# Patient Record
Sex: Female | Born: 1935 | Race: White | Hispanic: No | Smoking: Never smoker
Health system: Southern US, Community
[De-identification: ages and names within clinical notes are randomized; demographics above are authoritative.]

## PROBLEM LIST (undated history)

## (undated) DIAGNOSIS — K279 Peptic ulcer, site unspecified, unspecified as acute or chronic, without hemorrhage or perforation: Secondary | ICD-10-CM

## (undated) DIAGNOSIS — I00 Rheumatic fever without heart involvement: Secondary | ICD-10-CM

## (undated) DIAGNOSIS — I471 Supraventricular tachycardia: Secondary | ICD-10-CM

## (undated) DIAGNOSIS — I5189 Other ill-defined heart diseases: Secondary | ICD-10-CM

## (undated) DIAGNOSIS — M8580 Other specified disorders of bone density and structure, unspecified site: Secondary | ICD-10-CM

## (undated) DIAGNOSIS — N183 Chronic kidney disease, stage 3 unspecified: Secondary | ICD-10-CM

## (undated) DIAGNOSIS — N950 Postmenopausal bleeding: Secondary | ICD-10-CM

## (undated) DIAGNOSIS — E785 Hyperlipidemia, unspecified: Secondary | ICD-10-CM

## (undated) DIAGNOSIS — E7800 Pure hypercholesterolemia, unspecified: Secondary | ICD-10-CM

## (undated) DIAGNOSIS — I2510 Atherosclerotic heart disease of native coronary artery without angina pectoris: Secondary | ICD-10-CM

## (undated) DIAGNOSIS — I350 Nonrheumatic aortic (valve) stenosis: Secondary | ICD-10-CM

## (undated) DIAGNOSIS — Z8711 Personal history of peptic ulcer disease: Secondary | ICD-10-CM

## (undated) DIAGNOSIS — R0600 Dyspnea, unspecified: Secondary | ICD-10-CM

## (undated) DIAGNOSIS — M1990 Unspecified osteoarthritis, unspecified site: Secondary | ICD-10-CM

## (undated) DIAGNOSIS — J302 Other seasonal allergic rhinitis: Secondary | ICD-10-CM

## (undated) DIAGNOSIS — I5032 Chronic diastolic (congestive) heart failure: Secondary | ICD-10-CM

## (undated) DIAGNOSIS — C50911 Malignant neoplasm of unspecified site of right female breast: Secondary | ICD-10-CM

## (undated) DIAGNOSIS — E039 Hypothyroidism, unspecified: Secondary | ICD-10-CM

## (undated) DIAGNOSIS — Z8679 Personal history of other diseases of the circulatory system: Secondary | ICD-10-CM

## (undated) DIAGNOSIS — C50919 Malignant neoplasm of unspecified site of unspecified female breast: Secondary | ICD-10-CM

## (undated) DIAGNOSIS — E559 Vitamin D deficiency, unspecified: Secondary | ICD-10-CM

## (undated) DIAGNOSIS — M810 Age-related osteoporosis without current pathological fracture: Secondary | ICD-10-CM

## (undated) DIAGNOSIS — I779 Disorder of arteries and arterioles, unspecified: Secondary | ICD-10-CM

## (undated) HISTORY — DX: Malignant neoplasm of unspecified site of right female breast: C50.911

## (undated) HISTORY — DX: Chronic kidney disease, stage 3 (moderate): N18.3

## (undated) HISTORY — DX: Other specified disorders of bone density and structure, unspecified site: M85.80

## (undated) HISTORY — PX: BREAST SURGERY: SHX581

## (undated) HISTORY — DX: Pure hypercholesterolemia, unspecified: E78.00

## (undated) HISTORY — DX: Vitamin D deficiency, unspecified: E55.9

## (undated) HISTORY — PX: THYROID SURGERY: SHX805

## (undated) HISTORY — DX: Atherosclerotic heart disease of native coronary artery without angina pectoris: I25.10

## (undated) HISTORY — DX: Hypothyroidism, unspecified: E03.9

## (undated) HISTORY — PX: TONSILLECTOMY: SUR1361

## (undated) HISTORY — DX: Chronic kidney disease, stage 3 unspecified: N18.30

## (undated) HISTORY — PX: TOTAL KNEE ARTHROPLASTY: SHX125

## (undated) HISTORY — DX: Rheumatic fever without heart involvement: I00

## (undated) HISTORY — DX: Supraventricular tachycardia: I47.1

## (undated) HISTORY — DX: Peptic ulcer, site unspecified, unspecified as acute or chronic, without hemorrhage or perforation: K27.9

## (undated) HISTORY — DX: Disorder of arteries and arterioles, unspecified: I77.9

## (undated) HISTORY — DX: Other ill-defined heart diseases: I51.89

## (undated) HISTORY — PX: CATARACT EXTRACTION W/ INTRAOCULAR LENS  IMPLANT, BILATERAL: SHX1307

---

## 1967-01-05 HISTORY — PX: THYROID SURGERY: SHX805

## 2001-01-04 HISTORY — PX: TOTAL HIP ARTHROPLASTY: SHX124

## 2003-11-11 ENCOUNTER — Encounter: Admission: RE | Admit: 2003-11-11 | Discharge: 2003-11-11 | Payer: Self-pay | Admitting: Family Medicine

## 2004-01-05 DIAGNOSIS — Z853 Personal history of malignant neoplasm of breast: Secondary | ICD-10-CM

## 2004-01-05 HISTORY — DX: Personal history of malignant neoplasm of breast: Z85.3

## 2004-01-05 HISTORY — PX: PARTIAL MASTECTOMY WITH AXILLARY SENTINEL LYMPH NODE BIOPSY: SHX6004

## 2004-03-26 ENCOUNTER — Encounter: Admission: RE | Admit: 2004-03-26 | Discharge: 2004-03-26 | Payer: Self-pay | Admitting: Family Medicine

## 2004-04-22 ENCOUNTER — Encounter (INDEPENDENT_AMBULATORY_CARE_PROVIDER_SITE_OTHER): Payer: Self-pay | Admitting: Diagnostic Radiology

## 2004-04-22 ENCOUNTER — Encounter: Admission: RE | Admit: 2004-04-22 | Discharge: 2004-04-22 | Payer: Self-pay | Admitting: Family Medicine

## 2004-04-22 ENCOUNTER — Encounter (INDEPENDENT_AMBULATORY_CARE_PROVIDER_SITE_OTHER): Payer: Self-pay | Admitting: *Deleted

## 2004-04-26 ENCOUNTER — Encounter: Admission: RE | Admit: 2004-04-26 | Discharge: 2004-04-26 | Payer: Self-pay | Admitting: General Surgery

## 2004-05-13 ENCOUNTER — Encounter: Admission: RE | Admit: 2004-05-13 | Discharge: 2004-05-13 | Payer: Self-pay | Admitting: Surgery

## 2004-05-14 ENCOUNTER — Ambulatory Visit (HOSPITAL_COMMUNITY): Admission: RE | Admit: 2004-05-14 | Discharge: 2004-05-14 | Payer: Self-pay | Admitting: Surgery

## 2004-05-14 ENCOUNTER — Ambulatory Visit (HOSPITAL_BASED_OUTPATIENT_CLINIC_OR_DEPARTMENT_OTHER): Admission: RE | Admit: 2004-05-14 | Discharge: 2004-05-14 | Payer: Self-pay | Admitting: Surgery

## 2004-05-14 ENCOUNTER — Encounter (INDEPENDENT_AMBULATORY_CARE_PROVIDER_SITE_OTHER): Payer: Self-pay | Admitting: *Deleted

## 2004-05-15 ENCOUNTER — Ambulatory Visit: Payer: Self-pay | Admitting: Oncology

## 2004-05-22 ENCOUNTER — Ambulatory Visit: Admission: RE | Admit: 2004-05-22 | Discharge: 2004-07-10 | Payer: Self-pay | Admitting: Radiation Oncology

## 2004-06-09 ENCOUNTER — Ambulatory Visit (HOSPITAL_COMMUNITY): Admission: RE | Admit: 2004-06-09 | Discharge: 2004-06-09 | Payer: Self-pay | Admitting: Oncology

## 2004-06-30 ENCOUNTER — Ambulatory Visit: Payer: Self-pay | Admitting: Oncology

## 2004-08-17 ENCOUNTER — Ambulatory Visit: Payer: Self-pay | Admitting: Oncology

## 2004-08-27 ENCOUNTER — Other Ambulatory Visit: Admission: RE | Admit: 2004-08-27 | Discharge: 2004-08-27 | Payer: Self-pay | Admitting: Obstetrics and Gynecology

## 2004-10-05 ENCOUNTER — Ambulatory Visit: Payer: Self-pay | Admitting: Oncology

## 2004-11-25 ENCOUNTER — Ambulatory Visit: Admission: RE | Admit: 2004-11-25 | Discharge: 2005-02-18 | Payer: Self-pay | Admitting: Radiation Oncology

## 2004-12-15 ENCOUNTER — Encounter: Admission: RE | Admit: 2004-12-15 | Discharge: 2004-12-15 | Payer: Self-pay | Admitting: Radiation Oncology

## 2005-01-01 ENCOUNTER — Ambulatory Visit: Payer: Self-pay | Admitting: Oncology

## 2005-03-15 ENCOUNTER — Ambulatory Visit: Payer: Self-pay | Admitting: Oncology

## 2005-03-17 ENCOUNTER — Ambulatory Visit (HOSPITAL_COMMUNITY): Admission: RE | Admit: 2005-03-17 | Discharge: 2005-03-17 | Payer: Self-pay | Admitting: Oncology

## 2005-03-23 ENCOUNTER — Encounter: Admission: RE | Admit: 2005-03-23 | Discharge: 2005-03-23 | Payer: Self-pay | Admitting: Oncology

## 2005-04-23 ENCOUNTER — Encounter: Admission: RE | Admit: 2005-04-23 | Discharge: 2005-04-23 | Payer: Self-pay | Admitting: Family Medicine

## 2005-06-23 ENCOUNTER — Ambulatory Visit: Payer: Self-pay | Admitting: Oncology

## 2005-09-03 ENCOUNTER — Ambulatory Visit: Payer: Self-pay | Admitting: Oncology

## 2005-09-08 LAB — COMPREHENSIVE METABOLIC PANEL
ALT: 16 U/L (ref 0–40)
BUN: 17 mg/dL (ref 6–23)
CO2: 28 mEq/L (ref 19–32)
Creatinine, Ser: 0.97 mg/dL (ref 0.40–1.20)
Total Bilirubin: 0.5 mg/dL (ref 0.3–1.2)

## 2005-09-08 LAB — CBC WITH DIFFERENTIAL/PLATELET
BASO%: 0.5 % (ref 0.0–2.0)
Basophils Absolute: 0 10*3/uL (ref 0.0–0.1)
HCT: 38.9 % (ref 34.8–46.6)
LYMPH%: 25 % (ref 14.0–48.0)
MCH: 32.4 pg (ref 26.0–34.0)
MCHC: 34.5 g/dL (ref 32.0–36.0)
MONO#: 0.4 10*3/uL (ref 0.1–0.9)
NEUT%: 59.1 % (ref 39.6–76.8)
Platelets: 256 10*3/uL (ref 145–400)

## 2005-09-08 LAB — CANCER ANTIGEN 27.29: CA 27.29: 15 U/mL (ref 0–39)

## 2005-09-14 ENCOUNTER — Ambulatory Visit: Admission: RE | Admit: 2005-09-14 | Discharge: 2005-09-26 | Payer: Self-pay | Admitting: Radiation Oncology

## 2005-11-02 ENCOUNTER — Encounter: Admission: RE | Admit: 2005-11-02 | Discharge: 2005-11-02 | Payer: Self-pay | Admitting: Family Medicine

## 2006-03-03 ENCOUNTER — Ambulatory Visit: Payer: Self-pay | Admitting: Oncology

## 2006-03-08 LAB — CBC WITH DIFFERENTIAL/PLATELET
Basophils Absolute: 0 10*3/uL (ref 0.0–0.1)
Eosinophils Absolute: 0.2 10*3/uL (ref 0.0–0.5)
HCT: 40.6 % (ref 34.8–46.6)
HGB: 14.1 g/dL (ref 11.6–15.9)
MCH: 31.2 pg (ref 26.0–34.0)
MONO#: 0.4 10*3/uL (ref 0.1–0.9)
NEUT#: 2.6 10*3/uL (ref 1.5–6.5)
NEUT%: 60.8 % (ref 39.6–76.8)
RDW: 13.2 % (ref 11.3–14.5)
WBC: 4.3 10*3/uL (ref 3.9–10.0)
lymph#: 1.1 10*3/uL (ref 0.9–3.3)

## 2006-03-08 LAB — COMPREHENSIVE METABOLIC PANEL
ALT: 16 U/L (ref 0–35)
Alkaline Phosphatase: 73 U/L (ref 39–117)
Creatinine, Ser: 1.07 mg/dL (ref 0.40–1.20)
Glucose, Bld: 124 mg/dL — ABNORMAL HIGH (ref 70–99)
Sodium: 140 mEq/L (ref 135–145)
Total Bilirubin: 0.5 mg/dL (ref 0.3–1.2)
Total Protein: 7.3 g/dL (ref 6.0–8.3)

## 2006-04-25 ENCOUNTER — Encounter: Admission: RE | Admit: 2006-04-25 | Discharge: 2006-04-25 | Payer: Self-pay | Admitting: Oncology

## 2006-09-01 ENCOUNTER — Ambulatory Visit: Payer: Self-pay | Admitting: Oncology

## 2006-09-06 LAB — COMPREHENSIVE METABOLIC PANEL
BUN: 24 mg/dL — ABNORMAL HIGH (ref 6–23)
CO2: 26 mEq/L (ref 19–32)
Calcium: 9.7 mg/dL (ref 8.4–10.5)
Chloride: 103 mEq/L (ref 96–112)
Creatinine, Ser: 0.94 mg/dL (ref 0.40–1.20)
Glucose, Bld: 93 mg/dL (ref 70–99)
Total Bilirubin: 0.6 mg/dL (ref 0.3–1.2)

## 2006-09-06 LAB — CANCER ANTIGEN 27.29: CA 27.29: 10 U/mL (ref 0–39)

## 2006-09-06 LAB — CBC WITH DIFFERENTIAL/PLATELET
BASO%: 0.5 % (ref 0.0–2.0)
Basophils Absolute: 0 10*3/uL (ref 0.0–0.1)
HCT: 38.6 % (ref 34.8–46.6)
HGB: 13.5 g/dL (ref 11.6–15.9)
LYMPH%: 26.2 % (ref 14.0–48.0)
MCHC: 35 g/dL (ref 32.0–36.0)
MONO#: 0.3 10*3/uL (ref 0.1–0.9)
NEUT%: 62.1 % (ref 39.6–76.8)
Platelets: 257 10*3/uL (ref 145–400)
WBC: 3.9 10*3/uL (ref 3.9–10.0)
lymph#: 1 10*3/uL (ref 0.9–3.3)

## 2007-01-05 HISTORY — PX: REPLACEMENT TOTAL KNEE: SUR1224

## 2007-01-16 ENCOUNTER — Inpatient Hospital Stay (HOSPITAL_COMMUNITY): Admission: RE | Admit: 2007-01-16 | Discharge: 2007-01-19 | Payer: Self-pay | Admitting: Orthopedic Surgery

## 2007-03-02 ENCOUNTER — Ambulatory Visit: Payer: Self-pay | Admitting: Oncology

## 2007-03-07 LAB — COMPREHENSIVE METABOLIC PANEL WITH GFR
ALT: 14 U/L (ref 0–35)
AST: 16 U/L (ref 0–37)
Albumin: 4.8 g/dL (ref 3.5–5.2)
Alkaline Phosphatase: 81 U/L (ref 39–117)
BUN: 17 mg/dL (ref 6–23)
CO2: 27 meq/L (ref 19–32)
Calcium: 10.2 mg/dL (ref 8.4–10.5)
Chloride: 97 meq/L (ref 96–112)
Creatinine, Ser: 1.02 mg/dL (ref 0.40–1.20)
Glucose, Bld: 121 mg/dL — ABNORMAL HIGH (ref 70–99)
Potassium: 4.5 meq/L (ref 3.5–5.3)
Sodium: 138 meq/L (ref 135–145)
Total Bilirubin: 0.6 mg/dL (ref 0.3–1.2)
Total Protein: 7.9 g/dL (ref 6.0–8.3)

## 2007-03-07 LAB — CBC WITH DIFFERENTIAL/PLATELET
BASO%: 0.3 % (ref 0.0–2.0)
EOS%: 3.3 % (ref 0.0–7.0)
MCH: 31.7 pg (ref 26.0–34.0)
MCV: 92.9 fL (ref 81.0–101.0)
MONO%: 5.9 % (ref 0.0–13.0)
NEUT#: 3.6 10*3/uL (ref 1.5–6.5)
RBC: 4.48 10*6/uL (ref 3.70–5.32)
RDW: 13.4 % (ref 11.3–14.5)

## 2007-03-07 LAB — CANCER ANTIGEN 27.29: CA 27.29: 17 U/mL (ref 0–39)

## 2007-04-28 ENCOUNTER — Encounter: Admission: RE | Admit: 2007-04-28 | Discharge: 2007-04-28 | Payer: Self-pay | Admitting: Oncology

## 2007-09-04 ENCOUNTER — Ambulatory Visit: Payer: Self-pay | Admitting: Oncology

## 2007-09-07 LAB — CBC WITH DIFFERENTIAL/PLATELET
Basophils Absolute: 0 10*3/uL (ref 0.0–0.1)
EOS%: 3.1 % (ref 0.0–7.0)
HGB: 13.7 g/dL (ref 11.6–15.9)
MCH: 32.8 pg (ref 26.0–34.0)
NEUT#: 1.6 10*3/uL (ref 1.5–6.5)
RDW: 13.4 % (ref 11.3–14.5)
WBC: 3.1 10*3/uL — ABNORMAL LOW (ref 3.9–10.0)
lymph#: 1 10*3/uL (ref 0.9–3.3)

## 2007-09-07 LAB — COMPREHENSIVE METABOLIC PANEL
ALT: 23 U/L (ref 0–35)
AST: 26 U/L (ref 0–37)
Albumin: 4.1 g/dL (ref 3.5–5.2)
BUN: 20 mg/dL (ref 6–23)
Calcium: 9.8 mg/dL (ref 8.4–10.5)
Chloride: 101 mEq/L (ref 96–112)
Potassium: 4.1 mEq/L (ref 3.5–5.3)

## 2008-03-05 ENCOUNTER — Ambulatory Visit: Payer: Self-pay | Admitting: Oncology

## 2008-03-07 LAB — CANCER ANTIGEN 27.29: CA 27.29: 7 U/mL (ref 0–39)

## 2008-03-07 LAB — CBC WITH DIFFERENTIAL/PLATELET
Basophils Absolute: 0 10*3/uL (ref 0.0–0.1)
HCT: 41 % (ref 34.8–46.6)
HGB: 14.1 g/dL (ref 11.6–15.9)
MONO#: 0.3 10*3/uL (ref 0.1–0.9)
NEUT%: 65.8 % (ref 38.4–76.8)
Platelets: 241 10*3/uL (ref 145–400)
WBC: 4 10*3/uL (ref 3.9–10.3)
lymph#: 0.9 10*3/uL (ref 0.9–3.3)

## 2008-03-07 LAB — COMPREHENSIVE METABOLIC PANEL
ALT: 16 U/L (ref 0–35)
BUN: 30 mg/dL — ABNORMAL HIGH (ref 6–23)
CO2: 28 mEq/L (ref 19–32)
Calcium: 10.1 mg/dL (ref 8.4–10.5)
Chloride: 100 mEq/L (ref 96–112)
Creatinine, Ser: 1.01 mg/dL (ref 0.40–1.20)
Glucose, Bld: 96 mg/dL (ref 70–99)

## 2008-04-29 ENCOUNTER — Encounter: Admission: RE | Admit: 2008-04-29 | Discharge: 2008-04-29 | Payer: Self-pay | Admitting: Oncology

## 2008-08-30 ENCOUNTER — Ambulatory Visit: Payer: Self-pay | Admitting: Oncology

## 2008-09-03 LAB — CBC WITH DIFFERENTIAL/PLATELET
EOS%: 2.4 % (ref 0.0–7.0)
Eosinophils Absolute: 0.1 10*3/uL (ref 0.0–0.5)
LYMPH%: 22.7 % (ref 14.0–49.7)
MCH: 32 pg (ref 25.1–34.0)
MCV: 92.5 fL (ref 79.5–101.0)
MONO%: 6.8 % (ref 0.0–14.0)
NEUT#: 3.3 10*3/uL (ref 1.5–6.5)
Platelets: 238 10*3/uL (ref 145–400)
RBC: 4.46 10*6/uL (ref 3.70–5.45)
RDW: 13.9 % (ref 11.2–14.5)

## 2008-09-04 LAB — COMPREHENSIVE METABOLIC PANEL
AST: 21 U/L (ref 0–37)
Alkaline Phosphatase: 69 U/L (ref 39–117)
BUN: 25 mg/dL — ABNORMAL HIGH (ref 6–23)
Glucose, Bld: 107 mg/dL — ABNORMAL HIGH (ref 70–99)
Sodium: 137 mEq/L (ref 135–145)
Total Bilirubin: 0.6 mg/dL (ref 0.3–1.2)
Total Protein: 7.3 g/dL (ref 6.0–8.3)

## 2008-09-04 LAB — VITAMIN D 25 HYDROXY (VIT D DEFICIENCY, FRACTURES): Vit D, 25-Hydroxy: 40 ng/mL (ref 30–89)

## 2009-02-26 ENCOUNTER — Ambulatory Visit: Payer: Self-pay | Admitting: Oncology

## 2009-03-03 LAB — CBC WITH DIFFERENTIAL/PLATELET
HCT: 39.6 % (ref 34.8–46.6)
LYMPH%: 26.6 % (ref 14.0–49.7)
MCHC: 34.8 g/dL (ref 31.5–36.0)
MONO#: 0.4 10*3/uL (ref 0.1–0.9)
lymph#: 1.5 10*3/uL (ref 0.9–3.3)

## 2009-03-03 LAB — COMPREHENSIVE METABOLIC PANEL
AST: 21 U/L (ref 0–37)
Albumin: 4.4 g/dL (ref 3.5–5.2)
CO2: 24 mEq/L (ref 19–32)
Calcium: 9.5 mg/dL (ref 8.4–10.5)
Chloride: 101 mEq/L (ref 96–112)
Glucose, Bld: 107 mg/dL — ABNORMAL HIGH (ref 70–99)
Sodium: 138 mEq/L (ref 135–145)

## 2009-05-08 ENCOUNTER — Encounter: Admission: RE | Admit: 2009-05-08 | Discharge: 2009-05-08 | Payer: Self-pay | Admitting: Oncology

## 2009-07-20 ENCOUNTER — Emergency Department (HOSPITAL_COMMUNITY): Admission: EM | Admit: 2009-07-20 | Discharge: 2009-07-21 | Payer: Self-pay | Admitting: Emergency Medicine

## 2010-01-15 ENCOUNTER — Ambulatory Visit: Payer: Self-pay | Admitting: Oncology

## 2010-01-19 LAB — CBC WITH DIFFERENTIAL/PLATELET
BASO%: 0.7 % (ref 0.0–2.0)
Basophils Absolute: 0 10*3/uL (ref 0.0–0.1)
EOS%: 2.5 % (ref 0.0–7.0)
Eosinophils Absolute: 0.1 10*3/uL (ref 0.0–0.5)
HCT: 42.8 % (ref 34.8–46.6)
HGB: 14.3 g/dL (ref 11.6–15.9)
LYMPH%: 31.7 % (ref 14.0–49.7)
MCH: 31.1 pg (ref 25.1–34.0)
MCHC: 33.5 g/dL (ref 31.5–36.0)
MCV: 93.1 fL (ref 79.5–101.0)
MONO#: 0.3 10*3/uL (ref 0.1–0.9)
MONO%: 6.3 % (ref 0.0–14.0)
NEUT#: 2.7 10*3/uL (ref 1.5–6.5)
NEUT%: 58.8 % (ref 38.4–76.8)
Platelets: 235 10*3/uL (ref 145–400)
RBC: 4.6 10*6/uL (ref 3.70–5.45)
RDW: 13.6 % (ref 11.2–14.5)
WBC: 4.5 10*3/uL (ref 3.9–10.3)
lymph#: 1.4 10*3/uL (ref 0.9–3.3)

## 2010-01-19 LAB — COMPREHENSIVE METABOLIC PANEL
ALT: 21 U/L (ref 0–35)
AST: 26 U/L (ref 0–37)
Albumin: 4.8 g/dL (ref 3.5–5.2)
Alkaline Phosphatase: 55 U/L (ref 39–117)
BUN: 26 mg/dL — ABNORMAL HIGH (ref 6–23)
CO2: 25 mEq/L (ref 19–32)
Calcium: 10.3 mg/dL (ref 8.4–10.5)
Chloride: 98 mEq/L (ref 96–112)
Creatinine, Ser: 0.91 mg/dL (ref 0.40–1.20)
Glucose, Bld: 94 mg/dL (ref 70–99)
Potassium: 4 mEq/L (ref 3.5–5.3)
Sodium: 137 mEq/L (ref 135–145)
Total Bilirubin: 0.5 mg/dL (ref 0.3–1.2)
Total Protein: 7.5 g/dL (ref 6.0–8.3)

## 2010-01-19 LAB — VITAMIN D 25 HYDROXY (VIT D DEFICIENCY, FRACTURES): Vit D, 25-Hydroxy: 47 ng/mL (ref 30–89)

## 2010-01-25 ENCOUNTER — Encounter: Payer: Self-pay | Admitting: Oncology

## 2010-03-21 LAB — DIFFERENTIAL
Basophils Absolute: 0 10*3/uL (ref 0.0–0.1)
Basophils Relative: 1 % (ref 0–1)
Eosinophils Absolute: 0.2 10*3/uL (ref 0.0–0.7)
Eosinophils Relative: 4 % (ref 0–5)
Lymphs Abs: 1.5 10*3/uL (ref 0.7–4.0)
Neutrophils Relative %: 54 % (ref 43–77)

## 2010-03-21 LAB — COMPREHENSIVE METABOLIC PANEL
ALT: 21 U/L (ref 0–35)
AST: 25 U/L (ref 0–37)
Alkaline Phosphatase: 54 U/L (ref 39–117)
CO2: 26 mEq/L (ref 19–32)
Calcium: 9.2 mg/dL (ref 8.4–10.5)
Chloride: 103 mEq/L (ref 96–112)
GFR calc Af Amer: >60 mL/min (ref >60)
GFR calc non Af Amer: 55 mL/min — ABNORMAL LOW (ref >60)
Glucose, Bld: 102 mg/dL — ABNORMAL HIGH (ref 70–99)
Potassium: 3.9 mEq/L (ref 3.5–5.1)
Sodium: 137 mEq/L (ref 135–145)
Total Bilirubin: 0.6 mg/dL (ref 0.3–1.2)

## 2010-03-21 LAB — PROTIME-INR
INR: 0.98 (ref 0.00–1.49)
Prothrombin Time: 12.9 seconds (ref 11.6–15.2)

## 2010-03-21 LAB — CBC
Hemoglobin: 14 g/dL (ref 12.0–15.0)
MCHC: 34.4 g/dL (ref 30.0–36.0)
RBC: 4.32 MIL/uL (ref 3.87–5.11)
RDW: 13.5 % (ref 11.5–15.5)
WBC: 4.7 10*3/uL (ref 4.0–10.5)

## 2010-04-14 ENCOUNTER — Other Ambulatory Visit: Payer: Self-pay | Admitting: Family Medicine

## 2010-04-14 DIAGNOSIS — Z9889 Other specified postprocedural states: Secondary | ICD-10-CM

## 2010-04-14 DIAGNOSIS — Z853 Personal history of malignant neoplasm of breast: Secondary | ICD-10-CM

## 2010-05-14 ENCOUNTER — Ambulatory Visit
Admission: RE | Admit: 2010-05-14 | Discharge: 2010-05-14 | Disposition: A | Discharge status: Home / Self Care | Payer: Medicare Other | Source: Ambulatory Visit | Attending: Family Medicine | Admitting: Family Medicine

## 2010-05-14 DIAGNOSIS — Z9889 Other specified postprocedural states: Secondary | ICD-10-CM

## 2010-05-14 DIAGNOSIS — Z853 Personal history of malignant neoplasm of breast: Secondary | ICD-10-CM

## 2010-05-19 NOTE — Discharge Summary (Signed)
[name], [name]              ACCOUNT NO.:  [id number]   MEDICAL RECORD NO.:  [id number]          PATIENT TYPE:  INP   LOCATION:  1607                         FACILITY:  [facility]   PHYSICIAN:  Madlyn Frankel. Charlann Boxer, M.D.  DATE OF BIRTH:  09/09/1931   DATE OF ADMISSION:  01/16/2007  DATE OF DISCHARGE:  01/19/2007                               DISCHARGE SUMMARY   ADMISSION DIAGNOSES:  1. Osteoarthritis.  2. Hyperthyroidism.  3. Hypercholesteremia.  4. Breast cancer.  5. Systolic heart murmur.  6. Rheumatic fever in remote past.   DISCHARGE DIAGNOSES:  1. Osteoarthritis.  2. Hyperthyroidism.  3. Hypercholesteremia.  4. Breast cancer.  5. Systolic heart murmur.  6. Rheumatic fever in remote past.   CONSULTATION:  None.   PROCEDURE:  Right total knee arthroplasty by surgeon Dr. Durene Romans;  assistant Dwyane Luo, PA-C.   LABS:  Preadmission CBC:  Hematocrit 40.2 at time of discharge.  Postop  day number two 28.8.  White cell differential normal.  Coags normal.  Routine chemistries on admission:  Sodium 136, potassium 4.7, glucose  99, creatinine 0.86.  At discharge, all stable.  Sodium 137, potassium  3.6, glucose 117, creatinine 0.87.  Kidney function:  GFR greater than  60 with calcium 9.1 at discharge.  UA negative.  There were some small  hemoglobin in the urine.   CARDIOLOGY:  EKG normal sinus rhythm.   RADIOLOGY:  Chest 2-view findings compatible with COPD and emphysema.  No acute chest findings.   HISTORY OF PRESENT ILLNESS:  [name] with a history of  right knee pain secondary to osteoarthritis.  It has been refractory to  all conservative treatments.  Presurgical assessed by her primary care  physician, Dr. Blair Heys.   HOSPITAL COURSE:  The patient underwent a right total knee replacement.  Admitted to the orthopedic floor.  She made good progress during the  course of stay.  She remained afebrile throughout.  She was  hemodynamically stable.  Her  dressing was changed on a daily basis after  postop day #1 when her Hemovac was discontinued.  It remained clean,  dry, and intact.  Wound had no active drainage.  She was PT, OT,  weightbearing as tolerated.  Progressed nicely, was able to ambulate at  least 100 feet prior to discharge and weightbearing as tolerated.  For  DVT prophylaxis, Lovenox was started on postop day #1, continued for 2  weeks afterwards.  On day 3, she made significant progress and was  hemodynamically and orthopedically stable, ready for discharge.   DISCHARGE DISPOSITION:  Discharged home with home health care PT and  Lovenox, stable and improved condition.   DISCHARGE WOUND CARE:  Keep wound dry.   DISCHARGE DIET:  Regular as tolerated by the patient.   DISCHARGE PHYSICAL THERAPY:  Weightbearing as tolerated with the use of  rolling walker with goals of range of motion being 0 to 100 degrees in 2  weeks and 0 to 120 degrees at 6 weeks.   DISCHARGE MEDICATIONS:  1. Vicodin 5/325 one to two p.o. q.4 to 6 p.r.n. pain.  2. Lovenox 40 mg subcu q.24 x11 days.  3. Robaxin 500 mg p.o. q.6.  4. Enteric-coated aspirin 325 mg 1 p.o. daily x4 weeks after Lovenox      completed.  5. Iron 325 mg 1 p.o. t.i.d. x2 weeks.  6. Colace 100 mg p.o. b.i.d.  7. MiraLax 17 gm p.o. daily.   HOME MEDICATIONS:  1. Zocor 40 mg 1 p.o. nightly.  2. Synthroid 1.12 mg p.o. daily.  3. Multivitamin p.o. daily.  4. Magnesium 500 two tablets 1 time a day.  5. Calcium plus D 4 per day.  6. Femara 1 p.o. daily.  7. Prevacid 140 mg 1 p.o. daily.   DISCHARGE FOLLOWUP:  Follow up with Dr. Charlann Boxer, telephone number [phone],  in 14 days for wound check.     ______________________________  Yetta Glassman. [address], [place]      Madlyn Frankel. Charlann Boxer, M.D.  Electronically Signed    BLM/MEDQ  D:  02/07/2007  T:  02/07/2007  Job:  [id number]   cc:   Bryan Lemma. Manus Gunning, M.D.  Fax: [phone]   Valentino Hue. Magrinat, M.D.  Fax: [phone]   Armanda Magic,  M.D.  Fax: [phone]

## 2010-05-19 NOTE — Op Note (Signed)
[name], KLUTH              ACCOUNT NO.:  [id number]   MEDICAL RECORD NO.:  [id number]          PATIENT TYPE:  INP   LOCATION:  0003                         FACILITY:  [facility]   PHYSICIAN:  Madlyn Frankel. Charlann Boxer, M.D.  DATE OF BIRTH:  [birth year]   DATE OF PROCEDURE:  01/16/2007  DATE OF DISCHARGE:                               OPERATIVE REPORT   PREOPERATIVE DIAGNOSIS:  Right knee osteoarthritis.   POSTOPERATIVE DIAGNOSIS:  Right knee osteoarthritis.   PROCEDURE:  Right total knee replacement.   COMPONENTS USED:  Dupuy rotating platform, posterior stabilized knee  system, size 2.5 femur, size 2 tibia, 15 mm insert, and 38 patellar  button.   SURGEON:  Madlyn Frankel. Charlann Boxer, M.D.   ASSISTANT:  Yetta Glassman. Mann, PA   ANESTHESIA:  Duramorph spinal.   COMPLICATIONS:  None.   DRAINS:  One tourniquet x40 minutes at 250 mmHg.   INDICATIONS FOR PROCEDURE:  The patient is a 77 year old female who  presented to the office for end-stage right knee osteoarthritis  predominantly medial patellofemoral.  She had failed conservative  measures and at this point discussed surgical procedure, risks and  benefits of infection, DVT, component failure, stiffness postoperative,  and need for revision surgery all reviewed and discussed.  Consent was  obtained with the anticipation of pain relief.   DESCRIPTION OF PROCEDURE:  The patient was brought to the operating  room.  Once adequate anesthesia, preoperative antibiotics, Ancef,  administered the patient was positioned supine.  The proximal thigh  tourniquet was placed.  The right lower extremity was prescrubbed and  prepped and draped in the usual sterile fashion.  The leg was  exsanguinated and the tourniquet elevated to 250 mmHg.  A midline  incision was made followed by a medial parapatellar arthrotomy with  patellar subluxation rather than eversion.  Following this debridement,  I attended to the patella first.  Precut measures 21 mm was taken  down  to 14 mm and chose to use a 35 patellar button.  The peg holes were made  and I placed a metal shield to protect the cut surface of the patella  from the retractors.  Attention was now directed to the femur.  A  femoral drill was inserted into the femur and the canal irrigated to  prevent fat emboli.  We then passed the intermedullary rod and at 5  degrees of valgus resected 10 mm of bone off the distal femur.   I sized the femur to be a size 2.5 and based on the posterior condylar  axis, I made my anterior, posterior, and chamfer cuts without notching  or complications.  The final box cut was made based off the thinner  portion of the femur.  Attention was now directed to the tibia with the  tibia subluxated anteriorly.  Further debridement of of menisci and  cruciates done, I resected 10 mm of bone off the proximal lateral tibia.  I checked with the spacer block and this showed that there was going to  be at least  a 10, probably needing a 12.5 or 15 based on this  assessment.  At this point I finished off the tibia, checked my cut  surface with an alignment rod which showed that the cut was  perpendicular in both wounds.  I drilled it, keel punched it, and did a  trial reduction first with a 12.5 and then 15 insert.  With the 15 mm  insert, the knee came out in full extension, flexed nicely, and had  stable ligaments and good balance of the knee with patella tracking  through the trochlea without issue.  At this point the trial components  were removed.  The synovial capsule junction was injected with 60 mL of  0.25% Marcaine with epinephrine and 1 mL of Toradol.  The knee was  irrigated with normal saline solution, the components were opened and  cement mixed.  The components were then cemented in position and the  knee was brought to extension with 15 mm insert in place.  The extruded  cement was removed and cement cured, and assessment showed there was no  further cement.   The tourniquet was let down at 40 minutes.  There was  no evidence of any significant bleeding or venous stasis required.  A  final 15 x 2.5 insert was placed.  At this point we irrigated the knee  out again and closed the knee with Hemovac drain deep.  Extension  mechanism was then reapproximated with the knee in flexion with #1  Vicryl.  The remaining wound was closed in layers with a 2-0 Vicryl in  the subcu layer and a running 4-0 Monocryl.  The knee was then cleaned,  dried, and dressed sterilely with Steri-Strips and a bulky sterile wrap.  She was brought to the recovery room in stable condition tolerating the  procedure well.      Madlyn Frankel Charlann Boxer, M.D.  Electronically Signed     MDO/MEDQ  D:  01/16/2007  T:  01/16/2007  Job:  [id number]

## 2010-05-19 NOTE — H&P (Signed)
[name], [name]              ACCOUNT NO.:  [id number]   MEDICAL RECORD NO.:  [id number]         PATIENT TYPE:  LINP   LOCATION:                               FACILITY:  [facility]   PHYSICIAN:  Madlyn Frankel. Charlann Boxer, M.D.  DATE OF BIRTH:  09-21-1931   DATE OF ADMISSION:  08/02/2006  DATE OF DISCHARGE:                              HISTORY & PHYSICAL   PROCEDURE:  Right total knee arthroplasty.   CHIEF COMPLAINT:  Right knee pain.   HISTORY OF PRESENT ILLNESS:  This is a 77 year old female with a history  of persistent progressive right knee pain secondary to osteoarthritis.  She had been refractory to all conservative treatments.  She was  presurgically assessed by Dr. Blair Heys.   PAST MEDICAL HISTORY:  1. Osteoarthritis.  2. Hypothyroidism.  3. Hypercholesteremia.  4. PVCs.  5. Right breast cancer.  6. Systolic murmur, with mild aortic stenosis and aortic      insufficiency.   PAST SURGICAL HISTORY:  1. Right breast cancer, mastectomy.  2. Tonsillectomy in 1940.  3. Left hip replacement in 2003.  4. Thyroid surgery in 1969.   FAMILY MEDICAL HISTORY:  Heart disease and arthritis.   SOCIAL HISTORY:  She is married.  Primary caregiver will be her husband,  Deetya Drouillard, after surgery.   DRUG ALLERGIES:  COMPAZINE.   MEDICATIONS:  1. Boniva 150 mg q.month.  2. Simvastatin 20 mg 1 p.o. daily.  3. Synthroid 0.112 mg, or 112 mcg daily.  4. Enteric-coated aspirin 81 mg p.o. daily.  5. Femara 2.5 p.o. daily.  6. Calcium plus vitamin D.  7. Magnesium 500 mg 1 p.o. b.i.d.  8. Tylenol Arthritis p.r.n..  9. Celebrex 200 mg 1 p.o. b.i.d.   REVIEW OF SYSTEMS:  MUSCULOSKELETAL:  She does have some tenderness over  her left troch bursa.  Otherwise, see history of present illness.   PHYSICAL EXAMINATION:  VITAL SIGNS:  Pulse 72, respirations 18, blood  pressure 162/92.  GENERAL:  She is awake, alert, and oriented.  Well developed, well  nourished, in no acute distress.  NECK:   Supple.  No carotid bruits.  CHEST/LUNGS:  Clear to auscultation bilaterally.  BREASTS:  Deferred.  HEART:  Regular rate and rhythm, systolic murmur.  S1, S2 are distinct.  ABDOMEN:  Soft, nontender, nondistended.  Bowel sounds present.  GENITOURINARY:  Deferred.  EXTREMITIES:  Right knee comes out to full extension.  She does have  parapatellar tenderness.  SKIN:  Dorsalis pedis pulse positive.  No cellulitis.  NEUROLOGIC:  Intact distal sensibilities.   LABORATORIES, EKG, AND CHEST X-RAY:  All pending.   1. Osteoarthritis.  2. Hyperthyroidism.  3. Hypercholesteremia.  4. Breast cancer.  5. Systolic heart murmur.   PLAN OF ACTION:  Right total knee arthroplasty on August 02, 2006 at  [facility] by surgeon Dr. Durene Romans.  Risks and  complications were discussed.  Questions were encouraged, answered, and  reviewed.  Postoperative medications were given at time of history and  physical.  Celebrex perioperative medication prescribed as well.     ______________________________  Yetta Glassman.  [address], [place]      Madlyn Frankel. Charlann Boxer, M.D.  Electronically Signed    BLM/MEDQ  D:  07/25/2006  T:  07/25/2006  Job:  [id number]   cc:   Bryan Lemma. Manus Gunning, M.D.  Fax: [phone]

## 2010-05-19 NOTE — H&P (Signed)
[name], [name]              ACCOUNT NO.:  [id number]   MEDICAL RECORD NO.:  [id number]          PATIENT TYPE:  INP   LOCATION:  NA                           FACILITY:  [facility]   PHYSICIAN:  Madlyn Frankel. Charlann Boxer, M.D.  DATE OF BIRTH:  07/28/1931   DATE OF ADMISSION:  01/16/2007  DATE OF DISCHARGE:                              HISTORY & PHYSICAL   PROCEDURE:  Right total knee arthroplasty.   CHIEF COMPLAINTS:  Right knee pain.   HISTORY OF PRESENT ILLNESS:  [name] with a history of  right knee pain secondary to osteoarthritis.  It has been refractory to  all conservative treatments.  She has been presurgically assessed by her  primary care physician, Dr. Blair Heys.   PAST MEDICAL HISTORY:  1. Osteoarthritis.  2. Hyperthyroidism.  3. Hypercholesteremia.  4. Breast cancer.  5. Systolic heart murmur.  6. Rheumatic fever in significant past.   PAST SURGICAL HISTORY:  1. Significant for breast cancer with mastectomy in 2006.  2. Thyroid surgery in 1969.  3. Left hip replacement in June of 2003.  4. Tonsillectomy in 1940.   FAMILY HISTORY:  Heart disease, multiple sclerosis.   SOCIAL HISTORY:  The patient is widowed, retired.  Primary caregiver  will be available to help her in the home under the rehab process.   ALLERGIES:  COMPAZINE, F L O X I N.   MEDICATIONS:  1. Zocor 40 mg p.o. daily.  2. Synthroid.  Verify dosage and frequency amount with patient.  3. Multivitamin daily.  4. Calcium daily.  5. Baby aspirin daily.  6. Boniva.  7. Femara 1 p.o. daily.  8. Magnesium 2 twice a day.   REVIEW OF SYSTEMS:  None other than HPI.   PHYSICAL EXAMINATION:  VITAL SIGNS:  Pulse 72, respirations 18, blood  pressure 152/82.  GENERAL:  Awake, alert and oriented.  Well-developed, well-nourished.  No acute distress.  NECK:  Supple.  No carotid bruits.  CHEST:  Lungs clear to auscultation bilaterally.  BREASTS:  Deferred.  HEART:  Regular rate and rhythm  without gallops, clicks, rubs, or  murmurs.  ABDOMEN:  Soft, nontender, nondistended.  Bowel sounds present.  GENITOURINARY:  Deferred.  EXTREMITIES:  Dorsalis pedis pulse positive in the right lower  extremity.  Has some lateral laxity and medial sided tenderness.  SKIN:  No cellulitis.  NEUROLOGIC:  Intact distal sensibilities.   Labs, EKG, chest x-ray pending presurgical testing.   IMPRESSION:  1. Osteoarthritis.  2. Hyperthyroid.  3. Hypercholesterolemia.  4. Breast cancer.  5. Systolic heart murmur.   PLAN:  Right total knee arthroplasty on January 16, 2007 at [facility] by surgeon Dr. Durene Romans.  The risks and complications were  discussed.  Questions were encouraged and answered.   Postoperative medications including Lovenox, Robaxin, iron, aspirin,  Colace, MiraLax provided at time of history and physical.  Postoperative  pain medications will be provided at time of surgery.     ______________________________  Yetta Glassman [address], [place]      Madlyn Frankel. Charlann Boxer, M.D.  Electronically Signed  BLM/MEDQ  D:  12/26/2006  T:  12/27/2006  Job:  [id number]   cc:   Bryan Lemma. Manus Gunning, M.D.  Fax: [phone]   Valentino Hue. Magrinat, M.D.  Fax: [phone]   Armanda Magic, M.D.  Fax: [phone]

## 2010-09-24 LAB — BASIC METABOLIC PANEL
BUN: 10
BUN: 16
CO2: 29
Calcium: 8.9
Calcium: 9.1
Chloride: 101
Creatinine, Ser: 0.91
GFR calc Af Amer: >60
GFR calc Af Amer: >60
GFR calc non Af Amer: >60
GFR calc non Af Amer: >60
GFR calc non Af Amer: >60
Glucose, Bld: 92
Potassium: 3.6
Potassium: 4.7
Sodium: 136
Sodium: 137
Sodium: 140

## 2010-09-24 LAB — DIFFERENTIAL
Basophils Relative: 0
Eosinophils Absolute: 0.1
Eosinophils Relative: 2
Monocytes Relative: 10
Neutrophils Relative %: 61

## 2010-09-24 LAB — CBC
HCT: 28.8 — ABNORMAL LOW
Hemoglobin: 10 — ABNORMAL LOW
MCHC: 34.5
MCHC: 35
MCV: 93.8
Platelets: 204
Platelets: 221
Platelets: 301
RBC: 4.29
RDW: 12.5
RDW: 12.6
WBC: 5

## 2010-09-24 LAB — URINE MICROSCOPIC-ADD ON

## 2010-09-24 LAB — URINALYSIS, ROUTINE W REFLEX MICROSCOPIC
Bilirubin Urine: NEGATIVE
Leukocytes, UA: NEGATIVE
Nitrite: NEGATIVE
Specific Gravity, Urine: 1.018
pH: 5.5

## 2010-09-24 LAB — TYPE AND SCREEN

## 2010-09-24 LAB — PROTIME-INR: INR: 1

## 2011-01-25 DIAGNOSIS — J029 Acute pharyngitis, unspecified: Secondary | ICD-10-CM | POA: Diagnosis not present

## 2011-02-25 ENCOUNTER — Other Ambulatory Visit: Payer: Self-pay | Admitting: Family Medicine

## 2011-02-25 DIAGNOSIS — Z853 Personal history of malignant neoplasm of breast: Secondary | ICD-10-CM

## 2011-03-16 DIAGNOSIS — M2010 Hallux valgus (acquired), unspecified foot: Secondary | ICD-10-CM | POA: Diagnosis not present

## 2011-03-16 DIAGNOSIS — M2040 Other hammer toe(s) (acquired), unspecified foot: Secondary | ICD-10-CM | POA: Diagnosis not present

## 2011-04-01 DIAGNOSIS — R030 Elevated blood-pressure reading, without diagnosis of hypertension: Secondary | ICD-10-CM | POA: Diagnosis not present

## 2011-04-01 DIAGNOSIS — Z0389 Encounter for observation for other suspected diseases and conditions ruled out: Secondary | ICD-10-CM | POA: Diagnosis not present

## 2011-04-01 DIAGNOSIS — I4949 Other premature depolarization: Secondary | ICD-10-CM | POA: Diagnosis not present

## 2011-04-01 DIAGNOSIS — I062 Rheumatic aortic stenosis with insufficiency: Secondary | ICD-10-CM | POA: Diagnosis not present

## 2011-04-08 DIAGNOSIS — H52209 Unspecified astigmatism, unspecified eye: Secondary | ICD-10-CM | POA: Diagnosis not present

## 2011-04-08 DIAGNOSIS — H524 Presbyopia: Secondary | ICD-10-CM | POA: Diagnosis not present

## 2011-04-08 DIAGNOSIS — H40039 Anatomical narrow angle, unspecified eye: Secondary | ICD-10-CM | POA: Diagnosis not present

## 2011-04-16 DIAGNOSIS — L821 Other seborrheic keratosis: Secondary | ICD-10-CM | POA: Diagnosis not present

## 2011-04-16 DIAGNOSIS — L253 Unspecified contact dermatitis due to other chemical products: Secondary | ICD-10-CM | POA: Diagnosis not present

## 2011-04-16 DIAGNOSIS — D239 Other benign neoplasm of skin, unspecified: Secondary | ICD-10-CM | POA: Diagnosis not present

## 2011-04-16 DIAGNOSIS — L819 Disorder of pigmentation, unspecified: Secondary | ICD-10-CM | POA: Diagnosis not present

## 2011-05-18 ENCOUNTER — Ambulatory Visit
Admission: RE | Admit: 2011-05-18 | Discharge: 2011-05-18 | Disposition: A | Discharge status: Home / Self Care | Payer: Medicare Other | Source: Ambulatory Visit | Attending: Family Medicine | Admitting: Family Medicine

## 2011-05-18 DIAGNOSIS — Z853 Personal history of malignant neoplasm of breast: Secondary | ICD-10-CM | POA: Diagnosis not present

## 2011-06-07 DIAGNOSIS — Z01419 Encounter for gynecological examination (general) (routine) without abnormal findings: Secondary | ICD-10-CM | POA: Diagnosis not present

## 2011-07-02 DIAGNOSIS — E039 Hypothyroidism, unspecified: Secondary | ICD-10-CM | POA: Diagnosis not present

## 2011-07-02 DIAGNOSIS — M25569 Pain in unspecified knee: Secondary | ICD-10-CM | POA: Diagnosis not present

## 2011-07-02 DIAGNOSIS — E7800 Pure hypercholesterolemia, unspecified: Secondary | ICD-10-CM | POA: Diagnosis not present

## 2011-07-02 DIAGNOSIS — E559 Vitamin D deficiency, unspecified: Secondary | ICD-10-CM | POA: Diagnosis not present

## 2011-07-02 DIAGNOSIS — I062 Rheumatic aortic stenosis with insufficiency: Secondary | ICD-10-CM | POA: Diagnosis not present

## 2011-07-02 DIAGNOSIS — M899 Disorder of bone, unspecified: Secondary | ICD-10-CM | POA: Diagnosis not present

## 2011-07-02 DIAGNOSIS — M949 Disorder of cartilage, unspecified: Secondary | ICD-10-CM | POA: Diagnosis not present

## 2011-07-06 DIAGNOSIS — M1710 Unilateral primary osteoarthritis, unspecified knee: Secondary | ICD-10-CM | POA: Diagnosis not present

## 2011-07-30 DIAGNOSIS — M25559 Pain in unspecified hip: Secondary | ICD-10-CM | POA: Diagnosis not present

## 2011-07-30 DIAGNOSIS — M1710 Unilateral primary osteoarthritis, unspecified knee: Secondary | ICD-10-CM | POA: Diagnosis not present

## 2011-08-05 DIAGNOSIS — M1710 Unilateral primary osteoarthritis, unspecified knee: Secondary | ICD-10-CM | POA: Diagnosis not present

## 2011-08-09 DIAGNOSIS — M25559 Pain in unspecified hip: Secondary | ICD-10-CM | POA: Diagnosis not present

## 2011-08-11 DIAGNOSIS — M1710 Unilateral primary osteoarthritis, unspecified knee: Secondary | ICD-10-CM | POA: Diagnosis not present

## 2011-08-16 DIAGNOSIS — M1710 Unilateral primary osteoarthritis, unspecified knee: Secondary | ICD-10-CM | POA: Diagnosis not present

## 2011-08-19 DIAGNOSIS — M1710 Unilateral primary osteoarthritis, unspecified knee: Secondary | ICD-10-CM | POA: Diagnosis not present

## 2011-08-23 DIAGNOSIS — M1710 Unilateral primary osteoarthritis, unspecified knee: Secondary | ICD-10-CM | POA: Diagnosis not present

## 2011-08-24 DIAGNOSIS — I062 Rheumatic aortic stenosis with insufficiency: Secondary | ICD-10-CM | POA: Diagnosis not present

## 2011-08-24 DIAGNOSIS — R0602 Shortness of breath: Secondary | ICD-10-CM | POA: Diagnosis not present

## 2011-08-24 DIAGNOSIS — R5381 Other malaise: Secondary | ICD-10-CM | POA: Diagnosis not present

## 2011-09-01 DIAGNOSIS — I062 Rheumatic aortic stenosis with insufficiency: Secondary | ICD-10-CM | POA: Diagnosis not present

## 2011-09-01 DIAGNOSIS — R0602 Shortness of breath: Secondary | ICD-10-CM | POA: Diagnosis not present

## 2011-09-01 DIAGNOSIS — Z79899 Other long term (current) drug therapy: Secondary | ICD-10-CM | POA: Diagnosis not present

## 2011-09-01 DIAGNOSIS — I519 Heart disease, unspecified: Secondary | ICD-10-CM | POA: Diagnosis not present

## 2011-09-15 DIAGNOSIS — M1710 Unilateral primary osteoarthritis, unspecified knee: Secondary | ICD-10-CM | POA: Diagnosis not present

## 2011-09-27 DIAGNOSIS — E7800 Pure hypercholesterolemia, unspecified: Secondary | ICD-10-CM | POA: Diagnosis not present

## 2011-09-27 DIAGNOSIS — J069 Acute upper respiratory infection, unspecified: Secondary | ICD-10-CM | POA: Diagnosis not present

## 2011-10-17 DIAGNOSIS — Z23 Encounter for immunization: Secondary | ICD-10-CM | POA: Diagnosis not present

## 2011-11-25 DIAGNOSIS — I062 Rheumatic aortic stenosis with insufficiency: Secondary | ICD-10-CM | POA: Diagnosis not present

## 2011-11-25 DIAGNOSIS — I519 Heart disease, unspecified: Secondary | ICD-10-CM | POA: Diagnosis not present

## 2011-12-06 DIAGNOSIS — E7800 Pure hypercholesterolemia, unspecified: Secondary | ICD-10-CM | POA: Diagnosis not present

## 2012-01-03 DIAGNOSIS — M949 Disorder of cartilage, unspecified: Secondary | ICD-10-CM | POA: Diagnosis not present

## 2012-01-03 DIAGNOSIS — E559 Vitamin D deficiency, unspecified: Secondary | ICD-10-CM | POA: Diagnosis not present

## 2012-01-03 DIAGNOSIS — M1990 Unspecified osteoarthritis, unspecified site: Secondary | ICD-10-CM | POA: Diagnosis not present

## 2012-01-03 DIAGNOSIS — I062 Rheumatic aortic stenosis with insufficiency: Secondary | ICD-10-CM | POA: Diagnosis not present

## 2012-01-03 DIAGNOSIS — Z1331 Encounter for screening for depression: Secondary | ICD-10-CM | POA: Diagnosis not present

## 2012-01-03 DIAGNOSIS — E039 Hypothyroidism, unspecified: Secondary | ICD-10-CM | POA: Diagnosis not present

## 2012-01-03 DIAGNOSIS — E7800 Pure hypercholesterolemia, unspecified: Secondary | ICD-10-CM | POA: Diagnosis not present

## 2012-01-03 DIAGNOSIS — M899 Disorder of bone, unspecified: Secondary | ICD-10-CM | POA: Diagnosis not present

## 2012-02-09 DIAGNOSIS — R071 Chest pain on breathing: Secondary | ICD-10-CM | POA: Diagnosis not present

## 2012-03-31 DIAGNOSIS — E7800 Pure hypercholesterolemia, unspecified: Secondary | ICD-10-CM | POA: Diagnosis not present

## 2012-04-13 DIAGNOSIS — H40039 Anatomical narrow angle, unspecified eye: Secondary | ICD-10-CM | POA: Diagnosis not present

## 2012-04-13 DIAGNOSIS — H2510 Age-related nuclear cataract, unspecified eye: Secondary | ICD-10-CM | POA: Diagnosis not present

## 2012-04-13 DIAGNOSIS — H52209 Unspecified astigmatism, unspecified eye: Secondary | ICD-10-CM | POA: Diagnosis not present

## 2012-04-13 DIAGNOSIS — H25019 Cortical age-related cataract, unspecified eye: Secondary | ICD-10-CM | POA: Diagnosis not present

## 2012-04-24 ENCOUNTER — Other Ambulatory Visit: Payer: Self-pay | Admitting: Family Medicine

## 2012-04-24 DIAGNOSIS — Z1231 Encounter for screening mammogram for malignant neoplasm of breast: Secondary | ICD-10-CM

## 2012-04-24 DIAGNOSIS — Z853 Personal history of malignant neoplasm of breast: Secondary | ICD-10-CM

## 2012-05-10 DIAGNOSIS — T07XXXA Unspecified multiple injuries, initial encounter: Secondary | ICD-10-CM | POA: Diagnosis not present

## 2012-05-15 DIAGNOSIS — M25559 Pain in unspecified hip: Secondary | ICD-10-CM | POA: Diagnosis not present

## 2012-05-15 DIAGNOSIS — M25569 Pain in unspecified knee: Secondary | ICD-10-CM | POA: Diagnosis not present

## 2012-05-22 DIAGNOSIS — M79609 Pain in unspecified limb: Secondary | ICD-10-CM | POA: Diagnosis not present

## 2012-05-25 ENCOUNTER — Ambulatory Visit
Admission: RE | Admit: 2012-05-25 | Discharge: 2012-05-25 | Disposition: A | Discharge status: Home / Self Care | Payer: Medicare Other | Source: Ambulatory Visit | Attending: Family Medicine | Admitting: Family Medicine

## 2012-05-25 DIAGNOSIS — Z853 Personal history of malignant neoplasm of breast: Secondary | ICD-10-CM | POA: Diagnosis not present

## 2012-05-25 DIAGNOSIS — Z1231 Encounter for screening mammogram for malignant neoplasm of breast: Secondary | ICD-10-CM | POA: Diagnosis not present

## 2012-06-08 DIAGNOSIS — I062 Rheumatic aortic stenosis with insufficiency: Secondary | ICD-10-CM | POA: Diagnosis not present

## 2012-06-08 DIAGNOSIS — I519 Heart disease, unspecified: Secondary | ICD-10-CM | POA: Diagnosis not present

## 2012-06-08 DIAGNOSIS — M25569 Pain in unspecified knee: Secondary | ICD-10-CM | POA: Diagnosis not present

## 2012-06-19 DIAGNOSIS — I062 Rheumatic aortic stenosis with insufficiency: Secondary | ICD-10-CM | POA: Diagnosis not present

## 2012-06-19 DIAGNOSIS — I519 Heart disease, unspecified: Secondary | ICD-10-CM | POA: Diagnosis not present

## 2012-06-21 DIAGNOSIS — M25569 Pain in unspecified knee: Secondary | ICD-10-CM | POA: Diagnosis not present

## 2012-06-30 DIAGNOSIS — M25569 Pain in unspecified knee: Secondary | ICD-10-CM | POA: Diagnosis not present

## 2012-07-06 DIAGNOSIS — E7800 Pure hypercholesterolemia, unspecified: Secondary | ICD-10-CM | POA: Diagnosis not present

## 2012-07-06 DIAGNOSIS — E559 Vitamin D deficiency, unspecified: Secondary | ICD-10-CM | POA: Diagnosis not present

## 2012-07-06 DIAGNOSIS — M899 Disorder of bone, unspecified: Secondary | ICD-10-CM | POA: Diagnosis not present

## 2012-07-06 DIAGNOSIS — M949 Disorder of cartilage, unspecified: Secondary | ICD-10-CM | POA: Diagnosis not present

## 2012-07-06 DIAGNOSIS — I062 Rheumatic aortic stenosis with insufficiency: Secondary | ICD-10-CM | POA: Diagnosis not present

## 2012-07-06 DIAGNOSIS — E039 Hypothyroidism, unspecified: Secondary | ICD-10-CM | POA: Diagnosis not present

## 2012-08-17 DIAGNOSIS — M899 Disorder of bone, unspecified: Secondary | ICD-10-CM | POA: Diagnosis not present

## 2012-08-17 DIAGNOSIS — M949 Disorder of cartilage, unspecified: Secondary | ICD-10-CM | POA: Diagnosis not present

## 2012-09-27 ENCOUNTER — Other Ambulatory Visit: Payer: Self-pay | Admitting: Obstetrics and Gynecology

## 2012-09-27 DIAGNOSIS — Z124 Encounter for screening for malignant neoplasm of cervix: Secondary | ICD-10-CM | POA: Diagnosis not present

## 2012-10-04 DIAGNOSIS — I471 Supraventricular tachycardia, unspecified: Secondary | ICD-10-CM

## 2012-10-04 HISTORY — DX: Supraventricular tachycardia: I47.1

## 2012-10-04 HISTORY — DX: Supraventricular tachycardia, unspecified: I47.10

## 2012-10-21 ENCOUNTER — Emergency Department (HOSPITAL_COMMUNITY)
Admission: EM | Admit: 2012-10-21 | Discharge: 2012-10-21 | Disposition: A | Discharge status: Home / Self Care | Payer: Medicare Other | Attending: Emergency Medicine | Admitting: Emergency Medicine

## 2012-10-21 ENCOUNTER — Telehealth: Payer: Self-pay | Admitting: Physician Assistant

## 2012-10-21 ENCOUNTER — Emergency Department (HOSPITAL_COMMUNITY): Payer: Medicare Other

## 2012-10-21 ENCOUNTER — Encounter (HOSPITAL_COMMUNITY): Payer: Self-pay | Admitting: Emergency Medicine

## 2012-10-21 DIAGNOSIS — R072 Precordial pain: Secondary | ICD-10-CM | POA: Diagnosis not present

## 2012-10-21 DIAGNOSIS — I498 Other specified cardiac arrhythmias: Secondary | ICD-10-CM | POA: Insufficient documentation

## 2012-10-21 DIAGNOSIS — Z79899 Other long term (current) drug therapy: Secondary | ICD-10-CM | POA: Insufficient documentation

## 2012-10-21 DIAGNOSIS — R079 Chest pain, unspecified: Secondary | ICD-10-CM | POA: Insufficient documentation

## 2012-10-21 DIAGNOSIS — R002 Palpitations: Secondary | ICD-10-CM | POA: Diagnosis not present

## 2012-10-21 DIAGNOSIS — Z859 Personal history of malignant neoplasm, unspecified: Secondary | ICD-10-CM | POA: Insufficient documentation

## 2012-10-21 DIAGNOSIS — I471 Supraventricular tachycardia: Secondary | ICD-10-CM

## 2012-10-21 DIAGNOSIS — R0602 Shortness of breath: Secondary | ICD-10-CM | POA: Insufficient documentation

## 2012-10-21 DIAGNOSIS — Z7982 Long term (current) use of aspirin: Secondary | ICD-10-CM | POA: Insufficient documentation

## 2012-10-21 DIAGNOSIS — I2510 Atherosclerotic heart disease of native coronary artery without angina pectoris: Secondary | ICD-10-CM | POA: Diagnosis not present

## 2012-10-21 LAB — CBC
HCT: 45.6 % (ref 36.0–46.0)
Hemoglobin: 15.9 g/dL — ABNORMAL HIGH (ref 12.0–15.0)
MCH: 31.9 pg (ref 26.0–34.0)
MCHC: 34.9 g/dL (ref 30.0–36.0)
RBC: 4.99 MIL/uL (ref 3.87–5.11)

## 2012-10-21 LAB — URINALYSIS, ROUTINE W REFLEX MICROSCOPIC
Glucose, UA: NEGATIVE mg/dL
Specific Gravity, Urine: 1.02 (ref 1.005–1.030)
pH: 6 (ref 5.0–8.0)

## 2012-10-21 LAB — COMPREHENSIVE METABOLIC PANEL
AST: 29 U/L (ref 0–37)
Albumin: 4.1 g/dL (ref 3.5–5.2)
BUN: 23 mg/dL (ref 6–23)
CO2: 28 mEq/L (ref 19–32)
Glucose, Bld: 126 mg/dL — ABNORMAL HIGH (ref 70–99)
Potassium: 3.7 mEq/L (ref 3.5–5.1)
Sodium: 138 mEq/L (ref 135–145)
Total Bilirubin: 0.3 mg/dL (ref 0.3–1.2)

## 2012-10-21 LAB — POCT I-STAT TROPONIN I: Troponin i, poc: 0.07 ng/mL (ref 0.00–0.08)

## 2012-10-21 LAB — URINE MICROSCOPIC-ADD ON

## 2012-10-21 MED ORDER — METOPROLOL SUCCINATE ER 25 MG PO TB24: 25.0000 mg | ORAL_TABLET | Freq: Every day | ORAL | Status: DC

## 2012-10-21 MED ORDER — ADENOSINE 6 MG/2ML IV SOLN
INTRAVENOUS | Status: AC
  Filled 2012-10-21: qty 6

## 2012-10-21 MED ORDER — METOPROLOL TARTRATE 12.5 MG HALF TABLET: 25.0000 mg | ORAL_TABLET | Freq: Two times a day (BID) | ORAL | Status: DC

## 2012-10-21 MED ORDER — SODIUM CHLORIDE 0.9 % IV BOLUS (SEPSIS)
1000.0000 mL | Freq: Once | INTRAVENOUS | Status: AC
  Administered 2012-10-21: 1000 mL via INTRAVENOUS

## 2012-10-21 NOTE — ED Notes (Addendum)
Pt now coverted to NSR on cardiac monitor.

## 2012-10-21 NOTE — ED Provider Notes (Signed)
CSN: [id number]     Arrival date & time 10/21/12  1655 History   First MD Initiated Contact with Patient 10/21/12 1705     Chief Complaint  Patient presents with  . Palpitations   (Consider location/radiation/quality/duration/timing/severity/associated sxs/prior Treatment) Patient is a 79 y.o. female presenting with chest pain.  Chest Pain Pain location:  Substernal area Pain quality comment:  Palpitations Pain radiates to:  Does not radiate Pain radiates to the back: no   Pain severity:  Mild Onset quality:  Sudden Duration:  1 hour Timing:  Constant Progression:  Unchanged Chronicity:  New Context: at rest   Relieved by:  Nothing Worsened by:  Nothing tried Associated symptoms: shortness of breath   Associated symptoms: no abdominal pain, no back pain, no cough, no dizziness, no fever, no lower extremity edema, no nausea, no near-syncope, no numbness, no syncope and not vomiting     Past Medical History  Diagnosis Date  . Coronary artery disease   . Cancer    History reviewed. No pertinent past surgical history. History reviewed. No pertinent family history. History  Substance Use Topics  . Smoking status: Never Smoker   . Smokeless tobacco: Not on file  . Alcohol Use: No   OB History   Grav Para Term Preterm Abortions TAB SAB Ect Mult Living                 Review of Systems  Constitutional: Negative for fever and chills.  HENT: Negative for congestion, rhinorrhea and sore throat.   Eyes: Negative for photophobia and visual disturbance.  Respiratory: Positive for shortness of breath. Negative for cough.   Cardiovascular: Positive for chest pain. Negative for leg swelling, syncope and near-syncope.  Gastrointestinal: Negative for nausea, vomiting, abdominal pain, diarrhea and constipation.  Endocrine: Negative for polyphagia and polyuria.  Genitourinary: Negative for dysuria, flank pain, vaginal bleeding, vaginal discharge and enuresis.  Musculoskeletal:  Negative for back pain and gait problem.  Skin: Negative for color change and rash.  Neurological: Negative for dizziness, syncope, light-headedness and numbness.  Hematological: Negative for adenopathy. Does not bruise/bleed easily.  All other systems reviewed and are negative.    Allergies  Compazine and Floxin  Home Medications   Current Outpatient Rx  Name  Route  Sig  Dispense  Refill  . acetaminophen (TYLENOL) 500 MG tablet   Oral   Take 500 mg by mouth every 6 (six) hours as needed for pain.         Marland Kitchen aspirin 325 MG tablet   Oral   Take 325 mg by mouth once.         Marland Kitchen aspirin EC 81 MG tablet   Oral   Take 81 mg by mouth daily.         . Calcium Carbonate-Vitamin D (CALCIUM + D PO)   Oral   Take 1 tablet by mouth daily.         . celecoxib (CELEBREX) 200 MG capsule   Oral   Take 200 mg by mouth daily as needed for pain.         . Cyanocobalamin (VITAMIN B-12 PO)   Oral   Take 1 tablet by mouth daily.         . fish oil-omega-3 fatty acids 1000 MG capsule   Oral   Take 1 g by mouth daily.         Marland Kitchen ibuprofen (ADVIL,MOTRIN) 200 MG tablet   Oral   Take 200 mg by  mouth daily as needed for pain.         Marland Kitchen levothyroxine (SYNTHROID, LEVOTHROID) 112 MCG tablet   Oral   Take 112 mcg by mouth daily before breakfast.         . loratadine (CLARITIN) 10 MG tablet   Oral   Take 10 mg by mouth daily.         . magnesium gluconate (MAGONATE) 500 MG tablet   Oral   Take 1,000 mg by mouth daily.         . Multiple Vitamins-Minerals (MULTIVITAMIN PO)   Oral   Take 1 tablet by mouth daily.         . potassium chloride (K-DUR) 10 MEQ tablet   Oral   Take 10 mEq by mouth 2 (two) times daily as needed.         . Vitamin D, Ergocalciferol, (DRISDOL) 50000 UNITS CAPS capsule   Oral   Take 50,000 Units by mouth every 7 (seven) days.         . metoprolol (LOPRESSOR) 12.5 mg TABS tablet   Oral   Take 1 tablet (25 mg total) by mouth 2  (two) times daily.   30 tablet   0    BP 137/61  Pulse 70  Temp(Src) 98.3 F (36.8 C) (Oral)  Resp 18  Ht 5\' 3"  (1.6 m)  Wt 180 lb (81.647 kg)  BMI 31.89 kg/m2  SpO2 98% Physical Exam  Vitals reviewed. Constitutional: She is oriented to person, place, and time. She appears well-developed and well-nourished.  HENT:  Head: Normocephalic and atraumatic.  Right Ear: External ear normal.  Left Ear: External ear normal.  Eyes: Conjunctivae and EOM are normal. Pupils are equal, round, and reactive to light.  Neck: Normal range of motion. Neck supple.  Cardiovascular: Regular rhythm, normal heart sounds and intact distal pulses.  Tachycardia present.   Pulmonary/Chest: Effort normal and breath sounds normal.  Abdominal: Soft. Bowel sounds are normal. There is no tenderness.  Musculoskeletal: Normal range of motion.  Neurological: She is alert and oriented to person, place, and time.  Skin: Skin is warm and dry.    ED Course  Procedures (including critical care time) Labs Review Labs Reviewed  CBC - Abnormal; Notable for the following:    Hemoglobin 15.9 (*)    All other components within normal limits  COMPREHENSIVE METABOLIC PANEL - Abnormal; Notable for the following:    Glucose, Bld 126 (*)    GFR calc non Af Amer 53 (*)    GFR calc Af Amer 62 (*)    All other components within normal limits  URINALYSIS, ROUTINE W REFLEX MICROSCOPIC - Abnormal; Notable for the following:    Hgb urine dipstick TRACE (*)    Protein, ur 30 (*)    All other components within normal limits  URINE MICROSCOPIC-ADD ON - Abnormal; Notable for the following:    Squamous Epithelial / LPF FEW (*)    All other components within normal limits  POCT I-STAT TROPONIN I  POCT I-STAT TROPONIN I   Imaging Review Dg Chest Portable 1 View  10/21/2012   CLINICAL DATA:  Palpitations  EXAM: PORTABLE CHEST - 1 VIEW  COMPARISON:  02/09/2012  FINDINGS: Mild cardiomegaly. Low lung volumes. No confluent opacity  or effusion. No acute bony abnormality.  IMPRESSION: Cardiomegaly. Low lung volumes.   Electronically Signed   By: Charlett Nose M.D.   On: 10/21/2012 17:59    EKG Interpretation   None  Date: 10/22/2012  Rate: 164  Rhythm: SVT  QRS Axis: normal  Intervals: QRS 154 per read, however complex narrow  ST/T Wave abnormalities: normal  Conduction Disutrbances: none  Narrative Interpretation: SVT, new  Old EKG Reviewed: No significant changes noted   Date: 10/22/2012  Rate: 92  Rhythm: normal sinus rhythm  QRS Axis: normal  Intervals: short PR  ST/T Wave abnormalities: normal  Conduction Disutrbances: none  Narrative Interpretation:   Old EKG Reviewed: No significant changes noted       MDM   1. SVT (supraventricular tachycardia)    79 y.o. female  with pertinent PMH of coronary artery disease presents with symptoms as described above. On arrival patient with EKG as above consistent with SVT. Patient has no history of same, however states that she might have had remote history of atrial fibrillation. Ordered adenosine, however patient responded to vagal maneuvers and cardioverted to normal sinus rhythm. At that time she had near total relief of symptoms. Repeat EKG with sinus rhythm. Consulted cardiology who feel that patient is stable for outpatient management and recommends metoprolol, however prior to discharge patient with heart rate in the low 60s so informed her to call her cardiologist on Monday prior to beginning this medication.  Patient given standard return precautions for arrhythmia, chest pain, and dyspnea. Patient voiced understanding of precautions and agreed to followup.     Labs and imaging as above reviewed by myself and attending,Dr. Lynelle Doctor, with whom case was discussed.   1. SVT (supraventricular tachycardia)         Noel Gerold, MD 10/22/12 0981

## 2012-10-21 NOTE — Telephone Encounter (Signed)
Patient called because she was coming back from lunch and had sudden onset of a fluttering feeling in her chest. This was associated with weakness. She was a little short of breath with this as well. Her heart and initially felt like it was racing, but now only feels irregular. She still feels bad. She has a blood pressure machine but it is not working so she does not know what her heart rate her blood pressure is right now. She has never had symptoms like this before. She has a history of valvular abnormalities but no history of coronary artery disease or arrhythmia.  Advised the patient she needed to come to the emergency room but EMS. She was reluctant to call 911 but stated she would get a relative to drive her. She does not feel like she is in any danger of passing out.  Advised the patient I could not recommend this but requested she come to the hospital as soon as possible. She is to come to the [facility] emergency room.

## 2012-10-21 NOTE — ED Notes (Signed)
Pt c/o palpitations starting today with SOB; pt noted to be tachycardic

## 2012-10-21 NOTE — ED Notes (Signed)
Pt up to use bedside commode with assistance. Tolerating without difficulty.

## 2012-10-21 NOTE — ED Notes (Signed)
Pt did vagal maneuver following MD instructions.  Pt heart rate decreased to 105.

## 2012-10-22 NOTE — ED Provider Notes (Signed)
I saw and evaluated the patient, reviewed the resident's note and I agree with the findings and plan. I reviewed and interpreted the EKG during the patient's evaluation in the ED and agree with the resident's interpretation.  Pt with SVT episode in the ED.  Resolved with vagal maneuver.  Monitored without recurrence.  Discussed with cardiology.  Safe for outpatient follow up.  Celene Kras, MD 10/22/12 [phone]

## 2012-10-23 ENCOUNTER — Telehealth: Payer: Self-pay | Admitting: Cardiology

## 2012-10-23 NOTE — Telephone Encounter (Signed)
To Scheduling to try and get pt in today if possible with a PA

## 2012-10-23 NOTE — Telephone Encounter (Signed)
Please try to get in to see the PA today.  I am fully booked with 14 patients this afternoon.

## 2012-10-23 NOTE — Telephone Encounter (Signed)
Pt was given a rx for a beta blocker metoprolol 12.5 MG BID. The rx stated he BP was low already and he didn't want her to fill it until she saw us. Pts concerned and wants to see someone. OK to see PA? Since we do not have any available appt for pt.  

## 2012-10-23 NOTE — Telephone Encounter (Signed)
New Problem,  Pt was advised to hold off of the medications until she was seen by Dr. Mayford Knife today.. Please assist.

## 2012-10-23 NOTE — Telephone Encounter (Signed)
See if you can work her in on Thursday

## 2012-10-23 NOTE — Telephone Encounter (Signed)
To Dr Turner to advise 

## 2012-10-23 NOTE — Telephone Encounter (Signed)
TO Dr. Mayford Knife due to being overbooked on Thursday

## 2012-10-23 NOTE — Telephone Encounter (Signed)
Please keep appt that was already scheduled for next week.  Patient needs to call with any palpitaitons.

## 2012-10-23 NOTE — Telephone Encounter (Signed)
DO You still want her holding all medications until she sees you?

## 2012-10-23 NOTE — Telephone Encounter (Signed)
New Problem:  Pt states she is calling to schedule her appt post hospital. I offered the pt to schedule with Dr. Mayford Knife on Tuesday 10/28. Pt states she does not want an appt that far out. Pt wants to see Dr. Mayford Knife today. Please advise

## 2012-10-24 ENCOUNTER — Ambulatory Visit (INDEPENDENT_AMBULATORY_CARE_PROVIDER_SITE_OTHER): Payer: Medicare Other | Admitting: Nurse Practitioner

## 2012-10-24 ENCOUNTER — Encounter: Payer: Self-pay | Admitting: *Deleted

## 2012-10-24 ENCOUNTER — Encounter: Payer: Self-pay | Admitting: Nurse Practitioner

## 2012-10-24 VITALS — BP 108/70 | HR 64 | Ht 63.0 in | Wt 189.0 lb

## 2012-10-24 DIAGNOSIS — I498 Other specified cardiac arrhythmias: Secondary | ICD-10-CM

## 2012-10-24 DIAGNOSIS — I471 Supraventricular tachycardia: Secondary | ICD-10-CM | POA: Insufficient documentation

## 2012-10-24 LAB — TSH: TSH: 4.09 u[IU]/mL (ref 0.35–5.50)

## 2012-10-24 MED ORDER — METOPROLOL TARTRATE 25 MG PO TABS: 25.0000 mg | ORAL_TABLET | ORAL | Status: DC | PRN

## 2012-10-24 NOTE — Telephone Encounter (Signed)
New message     Was seen in ER on sat----confused about when to start betablocker

## 2012-10-24 NOTE — Patient Instructions (Addendum)
Stay on your current medicines but I have sent in a prescription for Lopressor 25 mg to take "as needed" - this is for a recurrent spell of increased HR or palpitations  See Dr. Mayford Knife in a month  Avoid caffeine/stimulants  We will check your thyroid level today  Call the [facility] office at [phone] if you have any questions, problems or concerns.

## 2012-10-24 NOTE — Telephone Encounter (Signed)
Please set up below

## 2012-10-24 NOTE — Telephone Encounter (Signed)
Dr. Mayford Knife wants pt to be seen by a PA today.

## 2012-10-24 NOTE — Progress Notes (Signed)
[name] Date of Birth: [birth year] Medical Record #[id number]  History of Present Illness: [name] is seen back today for a work in visit. Seen for Dr. Mayford Knife. She has diastolic dysfunction with a normal EF. Has AS/AI - last echo from June of 2014 with moderate AS, mild AI with grade 2 diastolic dysfunction with mild LVH. Other issues include HLD, hypothyroidism, osteopenia, PUD, right sided breast cancer, rheumatic fever as a child, vitamin D deficiency, asymptomatic bradycardia, and stage 3 CKD. Remote GXT from 2012 with poor exercise tolerance - atrial arrhythmia noted but negative for ischemia.   Last seen by Dr. Mayford Knife this past June.   Was in the ER this past weekend - had presented with chest pain but more a sensation of fast heart beat - lasted for several hours before seeking medical attention - EKG showed SVT at a rate of 164 - responded to vagal maneuvers(blew on her thumb) and converted to NSR. Phone discussion with cardiology and beta blocker was advised - but resting HR down to 60 when leaving - this was not started and she did not get it filled.   Comes in to see me today - here alone - she feels much better since the weekend. Looking back, really can't identify any possible trigger. No extra caffeine, no alcohol, no real stress, etc. Has been doing very well since her last visit with Dr. Mayford Knife - no chest pain. Has chronic shortness of breath - but no worse. No syncope. Tries to stay active.   Current Outpatient Prescriptions  Medication Sig Dispense Refill  . aspirin EC 81 MG tablet Take 81 mg by mouth daily.      . Calcium Carbonate-Vitamin D (CALCIUM + D PO) Take 1 tablet by mouth daily.      . celecoxib (CELEBREX) 200 MG capsule Take 200 mg by mouth daily as needed for pain.      . Coenzyme Q10 (COQ-10 PO) Take by mouth daily.      . Cyanocobalamin (VITAMIN B-12 PO) Take 1 tablet by mouth daily.      . fish oil-omega-3 fatty acids 1000 MG capsule Take 1 g by  mouth daily.      . furosemide (LASIX) 20 MG tablet Take 20 mg by mouth. Twice a week      . ibuprofen (ADVIL,MOTRIN) 200 MG tablet Take 200 mg by mouth daily as needed for pain.      Marland Kitchen lansoprazole (PREVACID) 30 MG capsule Take 30 mg by mouth daily.      Marland Kitchen levothyroxine (SYNTHROID, LEVOTHROID) 112 MCG tablet Take 112 mcg by mouth daily before breakfast.      . loratadine (CLARITIN) 10 MG tablet Take 10 mg by mouth daily.      . magnesium gluconate (MAGONATE) 500 MG tablet Take 1,000 mg by mouth 2 (two) times daily.       . Multiple Vitamins-Minerals (MULTIVITAMIN PO) Take 1 tablet by mouth daily.      . Pitavastatin Calcium (LIVALO) 1 MG TABS Take 1 mg by mouth daily.      . potassium chloride (K-DUR) 10 MEQ tablet Take 10 mEq by mouth. Twice a week with lasix      . Probiotic Product (PROBIOTIC DAILY PO) Take by mouth daily.      . Vitamin D, Ergocalciferol, (DRISDOL) 50000 UNITS CAPS capsule Take 50,000 Units by mouth every 7 (seven) days.      . metoprolol tartrate (LOPRESSOR) 25 MG tablet Take 1 tablet (  25 mg total) by mouth as needed.  30 tablet  3  . [DISCONTINUED] metoprolol succinate (TOPROL-XL) 25 MG 24 hr tablet Take 1 tablet (25 mg total) by mouth daily.  30 tablet  0   No current facility-administered medications for this visit.    Allergies  Allergen Reactions  . Atorvastatin     Myalgias 10/2011  . Compazine [Prochlorperazine]   . Fenofibrate     Rash 09/2011  . Floxin [Ofloxacin]   . Pravastatin Sodium     mylgias 09/2011  . Simvastatin     Elevated lft's 06/2011    Past Medical History  Diagnosis Date  . Coronary artery disease   . Cancer   . Hypercholesteremia   . Hypothyroidism   . Osteopenia   . PUD (peptic ulcer disease)   . Breast cancer, right breast   . Rheumatic fever     with mild to moderate aortic stenosis and mild AI and grade 2 diastolic dysfunction by echol 01/6107  . Atypical chest pain   . CKD (chronic kidney disease), stage III     No past  surgical history on file.  History  Smoking status  . Never Smoker   Smokeless tobacco  . Not on file    History  Alcohol Use No    No family history on file.  Review of Systems: The review of systems is per the HPI.  All other systems were reviewed and are negative.  Physical Exam: BP 108/70  Pulse 64  Ht 5\' 3"  (1.6 m)  Wt 189 lb (85.73 kg)  BMI 33.49 kg/m2 Patient is very pleasant and in no acute distress. She is obese. Skin is warm and dry. Color is normal.  HEENT is unremarkable. Normocephalic/atraumatic. PERRL. Sclera are nonicteric. Neck is supple. No masses. No JVD. Lungs are clear. Cardiac exam shows a regular rate and rhythm. Harsh outflow murmur noted. Abdomen is obese but soft. Extremities are without edema. Gait and ROM are intact. No gross neurologic deficits noted.  LABORATORY DATA: TSH pending    Lab Results  Component Value Date   WBC 5.9 10/21/2012   HGB 15.9* 10/21/2012   HCT 45.6 10/21/2012   PLT 255 10/21/2012   GLUCOSE 126* 10/21/2012   ALT 21 10/21/2012   AST 29 10/21/2012   NA 138 10/21/2012   K 3.7 10/21/2012   CL 98 10/21/2012   CREATININE 0.99 10/21/2012   BUN 23 10/21/2012   CO2 28 10/21/2012   INR 0.98 07/20/2009    Assessment / Plan: 1. Lone episode of SVT - lone episode - resolved with vagal maneuvers - I do not think she has enough BP or heart rate to support daily use of beta blocker therapy - will just use "prn" - lopressor 25 mg - I have sent this to the pharmacy. Will see her back in a month - if she has recurrent spells may need to consider EP referral or a trial of regular beta blocker with an event monitor. Will check a TSH today.   2. Rheumatic heart/valve disease - had an echo back in June with moderate AS and mild AI noted - do not think we need to repeat yet.   3. Documented asymptomatic bradycardia - will just use lopressor "prn"  Patient is agreeable to this plan and will call if any problems develop in the interim.    Rosalio Macadamia, RN, ANP-C [facility] [address] [place], [place]  [address]  

## 2012-11-24 ENCOUNTER — Ambulatory Visit (INDEPENDENT_AMBULATORY_CARE_PROVIDER_SITE_OTHER): Payer: Medicare Other | Admitting: Nurse Practitioner

## 2012-11-24 ENCOUNTER — Encounter: Payer: Self-pay | Admitting: Nurse Practitioner

## 2012-11-24 VITALS — BP 120/90 | HR 64 | Ht 63.5 in | Wt 188.8 lb

## 2012-11-24 DIAGNOSIS — I498 Other specified cardiac arrhythmias: Secondary | ICD-10-CM | POA: Diagnosis not present

## 2012-11-24 DIAGNOSIS — I471 Supraventricular tachycardia: Secondary | ICD-10-CM

## 2012-11-24 NOTE — Progress Notes (Signed)
[name] Date of Birth: 1931/11/17 Medical Record #[id number]  History of Present Illness: [name] is seen back today for a follow up visit. Seen for Dr. Mayford Knife. She has diastolic dysfunction with a normal EF. Has AS/AI - last echo from June of 2014 with moderate AS, mild AI with grade 2 diastolic dysfunction with mild LVH. Other issues include HLD, hypothyroidism, osteopenia, PUD, right sided breast cancer, rheumatic fever as a child, vitamin D deficiency, asymptomatic bradycardia, and stage 3 CKD. Remote GXT from 2012 with poor exercise tolerance - atrial arrhythmia noted but negative for ischemia.   Seen last month as a work in after being in the ER with chest pain and fast heart beat - was in SVT - responded to vagal maneuvers (blew on her thumb) and converted to NSR. Phone discussion with cardiology who advised beta blocker - but heart rate at 60 so it was not started/filled. She was feeling good at her visit with me. I suggested just "prn" use of beta blocker due to not having enough heart rate in my opinion to support daily therapy.   Comes back today. Here alone. Doing well. No chest pain. Has stable shortness of breath if she over exerts. No syncope. Remains very active. Notes that her heart will beat a little fast in the mornings but she will cough and then she is fine - nothing like what took her to the ER. She has not had to use ANY metoprolol.   Current Outpatient Prescriptions  Medication Sig Dispense Refill  . aspirin EC 81 MG tablet Take 81 mg by mouth daily.      . Calcium Carbonate-Vitamin D (CALCIUM + D PO) Take 1 tablet by mouth daily.      . celecoxib (CELEBREX) 200 MG capsule Take 200 mg by mouth daily as needed for pain.      . Coenzyme Q10 (COQ-10 PO) Take by mouth daily.      . Cyanocobalamin (VITAMIN B-12 PO) Take 1 tablet by mouth daily.      . fish oil-omega-3 fatty acids 1000 MG capsule Take 1 g by mouth daily.      . furosemide (LASIX) 20 MG tablet Take 20  mg by mouth as needed. Twice a week      . ibuprofen (ADVIL,MOTRIN) 200 MG tablet Take 200 mg by mouth daily as needed for pain.      Marland Kitchen lansoprazole (PREVACID) 30 MG capsule Take 30 mg by mouth daily.      Marland Kitchen levothyroxine (SYNTHROID, LEVOTHROID) 112 MCG tablet Take 112 mcg by mouth daily before breakfast.      . loratadine (CLARITIN) 10 MG tablet Take 10 mg by mouth daily.      . magnesium gluconate (MAGONATE) 500 MG tablet Take 1,000 mg by mouth 2 (two) times daily.       . Multiple Vitamins-Minerals (MULTIVITAMIN PO) Take 1 tablet by mouth daily.      . Pitavastatin Calcium (LIVALO) 1 MG TABS Take 0.5 mg by mouth every other day.       . potassium chloride (K-DUR) 10 MEQ tablet Take 10 mEq by mouth as needed. with lasix      . Probiotic Product (PROBIOTIC DAILY PO) Take by mouth daily.      . Vitamin D, Ergocalciferol, (DRISDOL) 50000 UNITS CAPS capsule Take 50,000 Units by mouth every 7 (seven) days.      . metoprolol tartrate (LOPRESSOR) 25 MG tablet Take 1 tablet (25 mg total) by mouth  as needed.  30 tablet  3  . [DISCONTINUED] metoprolol succinate (TOPROL-XL) 25 MG 24 hr tablet Take 1 tablet (25 mg total) by mouth daily.  30 tablet  0   No current facility-administered medications for this visit.    Allergies  Allergen Reactions  . Atorvastatin     Myalgias 10/2011  . Compazine [Prochlorperazine]   . Fenofibrate     Rash 09/2011  . Floxin [Ofloxacin]   . Pravastatin Sodium     mylgias 09/2011  . Simvastatin     Elevated lft's 06/2011    Past Medical History  Diagnosis Date  . Coronary artery disease   . Cancer   . Hypercholesteremia   . Hypothyroidism   . Osteopenia   . PUD (peptic ulcer disease)   . Breast cancer, right breast   . Rheumatic fever     with mild to moderate aortic stenosis and mild AI and grade 2 diastolic dysfunction by echol 0/9811  . Atypical chest pain   . CKD (chronic kidney disease), stage III   . SVT (supraventricular tachycardia) October 2014     converted with vagal maneuver    Past Surgical History  Procedure Laterality Date  . Thyroid surgery    . Total hip arthroplasty    . Replacement total knee    . Breast surgery      History  Smoking status  . Never Smoker   Smokeless tobacco  . Not on file    History  Alcohol Use No    Family History  Problem Relation Age of Onset  . Heart disease Mother   . Heart disease Father     Review of Systems: The review of systems is per the HPI.  All other systems were reviewed and are negative.  Physical Exam: BP 120/90  Pulse 64  Ht 5' 3.5" (1.613 m)  Wt 188 lb 12.8 oz (85.639 kg)  BMI 32.92 kg/m2  SpO2 95% Patient is very pleasant and in no acute distress. She is obese. Skin is warm and dry. Color is normal.  HEENT is unremarkable. Normocephalic/atraumatic. PERRL. Sclera are nonicteric. Neck is supple. No masses. No JVD. Lungs are clear. Cardiac exam shows a regular rate and rhythm. Abdomen is soft. Extremities are without edema. Gait and ROM are intact. No gross neurologic deficits noted.  LABORATORY DATA:  Lab Results  Component Value Date   WBC 5.9 10/21/2012   HGB 15.9* 10/21/2012   HCT 45.6 10/21/2012   PLT 255 10/21/2012   GLUCOSE 126* 10/21/2012   ALT 21 10/21/2012   AST 29 10/21/2012   NA 138 10/21/2012   K 3.7 10/21/2012   CL 98 10/21/2012   CREATININE 0.99 10/21/2012   BUN 23 10/21/2012   CO2 28 10/21/2012   TSH 4.09 10/24/2012   INR 0.98 07/20/2009     Assessment / Plan: 1. Lone episode of SVT - no recurrence. Has metoprolol to use prn.   2. Rheumatic heart/valve disease - last echo back in June of 2014 with moderate AS and mild AI noted - would repeat in 6 months  3. Documented asymptomatic bradycardia  4. Diastolic dysfunction  Overall, I think she is doing well. See Dr. Mayford Knife back in 3 months.   Patient is agreeable to this plan and will call if any problems develop in the interim.   Rosalio Macadamia, RN, ANP-C [facility] [address] [place], [place]  [address]

## 2012-11-24 NOTE — Patient Instructions (Addendum)
Stay on your current medicines - use the metoprolol if needed  See Dr. Mayford Knife back in 3 months  Stay active  Call the [facility] office at [phone] if you have any questions, problems or concerns.

## 2013-01-15 DIAGNOSIS — H1030 Unspecified acute conjunctivitis, unspecified eye: Secondary | ICD-10-CM | POA: Diagnosis not present

## 2013-02-02 DIAGNOSIS — I519 Heart disease, unspecified: Secondary | ICD-10-CM | POA: Diagnosis not present

## 2013-02-02 DIAGNOSIS — M899 Disorder of bone, unspecified: Secondary | ICD-10-CM | POA: Diagnosis not present

## 2013-02-02 DIAGNOSIS — M949 Disorder of cartilage, unspecified: Secondary | ICD-10-CM | POA: Diagnosis not present

## 2013-02-02 DIAGNOSIS — N183 Chronic kidney disease, stage 3 unspecified: Secondary | ICD-10-CM | POA: Diagnosis not present

## 2013-02-02 DIAGNOSIS — I062 Rheumatic aortic stenosis with insufficiency: Secondary | ICD-10-CM | POA: Diagnosis not present

## 2013-02-02 DIAGNOSIS — E039 Hypothyroidism, unspecified: Secondary | ICD-10-CM | POA: Diagnosis not present

## 2013-02-02 DIAGNOSIS — E559 Vitamin D deficiency, unspecified: Secondary | ICD-10-CM | POA: Diagnosis not present

## 2013-02-02 DIAGNOSIS — E7800 Pure hypercholesterolemia, unspecified: Secondary | ICD-10-CM | POA: Diagnosis not present

## 2013-02-13 ENCOUNTER — Ambulatory Visit (INDEPENDENT_AMBULATORY_CARE_PROVIDER_SITE_OTHER): Payer: Medicare Other | Admitting: Cardiology

## 2013-02-13 ENCOUNTER — Encounter: Payer: Self-pay | Admitting: Cardiology

## 2013-02-13 VITALS — BP 112/80 | HR 67 | Ht 63.5 in | Wt 190.8 lb

## 2013-02-13 DIAGNOSIS — I359 Nonrheumatic aortic valve disorder, unspecified: Secondary | ICD-10-CM | POA: Diagnosis not present

## 2013-02-13 DIAGNOSIS — I498 Other specified cardiac arrhythmias: Secondary | ICD-10-CM

## 2013-02-13 DIAGNOSIS — I471 Supraventricular tachycardia, unspecified: Secondary | ICD-10-CM

## 2013-02-13 DIAGNOSIS — I5032 Chronic diastolic (congestive) heart failure: Secondary | ICD-10-CM

## 2013-02-13 DIAGNOSIS — I350 Nonrheumatic aortic (valve) stenosis: Secondary | ICD-10-CM

## 2013-02-13 NOTE — Progress Notes (Signed)
[address], [place], [place]  [address] Phone: [phone] Fax:  [phone]  Date:  02/13/2013   ID:  [name], DOB 05/13/1931, MRN [id number]  PCP:  Simona Huh, MD  Cardiologist:  Fransico Him, MD     History of Present Illness: [name] is a 80 y.o. female with a history of rheumatic fever as a child, Mild to moderate AS with mild AI, SVT, diastolic dysfunction with chronic diastolic CHF who presents today for followup.  She is doing well.  She denies any chest pain.  She has chronic DOE which is stable.  She denies any LE edema, palpitations, dizziness or syncope.    Wt Readings from Last 3 Encounters:  02/13/13 190 lb 12.8 oz (86.546 kg)  11/24/12 188 lb 12.8 oz (85.639 kg)  10/24/12 189 lb (85.73 kg)     Past Medical History  Diagnosis Date  . Coronary artery disease   . Cancer   . Hypercholesteremia   . Hypothyroidism   . Osteopenia   . PUD (peptic ulcer disease)   . Breast cancer, right breast   . Rheumatic fever   . Atypical chest pain   . CKD (chronic kidney disease), stage III   . SVT (supraventricular tachycardia) October 2014    converted with vagal maneuver  . Vitamin D deficiency   . Aortic stenosis - moderate by echo 06/2012   . Diastolic dysfunction     Current Outpatient Prescriptions  Medication Sig Dispense Refill  . aspirin EC 81 MG tablet Take 81 mg by mouth daily.      . Calcium Carbonate-Vitamin D (CALCIUM + D PO) Take 1 tablet by mouth daily.      . Coenzyme Q10 (COQ-10 PO) Take by mouth daily.      . Cyanocobalamin (VITAMIN B-12 PO) Take 1 tablet by mouth daily.      . fish oil-omega-3 fatty acids 1000 MG capsule Take 1 g by mouth daily.      . furosemide (LASIX) 20 MG tablet Take 20 mg by mouth as needed. Twice a week      . ibuprofen (ADVIL,MOTRIN) 200 MG tablet Take 200 mg by mouth daily as needed for pain.      Marland Kitchen lansoprazole (PREVACID) 30 MG capsule Take 30 mg by mouth daily.      Marland Kitchen levothyroxine  (SYNTHROID, LEVOTHROID) 112 MCG tablet Take 112 mcg by mouth daily before breakfast.      . loratadine (CLARITIN) 10 MG tablet Take 10 mg by mouth daily.      . magnesium gluconate (MAGONATE) 500 MG tablet Take 1,000 mg by mouth 2 (two) times daily.       . metoprolol tartrate (LOPRESSOR) 25 MG tablet Take 1 tablet (25 mg total) by mouth as needed.  30 tablet  3  . Multiple Vitamins-Minerals (MULTIVITAMIN PO) Take 1 tablet by mouth daily.      . Pitavastatin Calcium (LIVALO) 1 MG TABS Take 0.5 mg by mouth every other day.       . potassium chloride (K-DUR) 10 MEQ tablet Take 10 mEq by mouth as needed. with lasix      . Probiotic Product (PROBIOTIC DAILY PO) Take by mouth daily.      . Vitamin D, Ergocalciferol, (DRISDOL) 50000 UNITS CAPS capsule Take 50,000 Units by mouth every 7 (seven) days.      . [DISCONTINUED] metoprolol succinate (TOPROL-XL) 25 MG 24 hr tablet Take 1 tablet (25 mg total)  by mouth daily.  30 tablet  0   No current facility-administered medications for this visit.    Allergies:    Allergies  Allergen Reactions  . Atorvastatin     Myalgias 10/2011  . Compazine [Prochlorperazine]   . Fenofibrate     Rash 09/2011  . Floxin [Ofloxacin]   . Pravastatin Sodium     mylgias 09/2011  . Simvastatin     Elevated lft's 06/2011    Social History:  The patient  reports that she has never smoked. She does not have any smokeless tobacco history on file. She reports that she does not drink alcohol or use illicit drugs.   Family History:  The patient's family history includes Heart disease in her father and mother.   ROS:  Please see the history of present illness.      All other systems reviewed and negative.   PHYSICAL EXAM: VS:  BP 112/80  Pulse 67  Ht 5' 3.5" (1.613 m)  Wt 190 lb 12.8 oz (86.546 kg)  BMI 33.26 kg/m2 Well nourished, well developed, in no acute distress HEENT: normal Neck: no JVD Cardiac:  normal S1, S2; RRR; no murmur Lungs:  clear to auscultation  bilaterally, no wheezing, rhonchi or rales Abd: soft, nontender, no hepatomegaly Ext: no edema Skin: warm and dry Neuro:  CNs 2-12 intact, no focal abnormalities noted  EKG:  NSR with PVC's     ASSESSMENT AND PLAN:  1. SVT with no reoccurence  - continue metoprolol 2. Chronic diastolic CHF appears compensated with chronic DOE  - continue Lasix/metoprolol 3. Moderate AS - will repeat echo in June 2015  Signed, Fransico Him, MD 02/13/2013 8:37 AM

## 2013-02-13 NOTE — Patient Instructions (Signed)
Your physician recommends that you continue on your current medications as directed. Please refer to the Current Medication list given to you today.  Your physician wants you to follow-up in: 6 Months with Dr Turner You will receive a reminder letter in the mail two months in advance. If you don't receive a letter, please call our office to schedule the follow-up appointment.  

## 2013-04-18 ENCOUNTER — Other Ambulatory Visit: Payer: Self-pay

## 2013-04-18 DIAGNOSIS — Z853 Personal history of malignant neoplasm of breast: Secondary | ICD-10-CM

## 2013-04-18 DIAGNOSIS — Z1231 Encounter for screening mammogram for malignant neoplasm of breast: Secondary | ICD-10-CM

## 2013-04-19 DIAGNOSIS — H40039 Anatomical narrow angle, unspecified eye: Secondary | ICD-10-CM | POA: Diagnosis not present

## 2013-04-19 DIAGNOSIS — H25019 Cortical age-related cataract, unspecified eye: Secondary | ICD-10-CM | POA: Diagnosis not present

## 2013-04-24 DIAGNOSIS — M25569 Pain in unspecified knee: Secondary | ICD-10-CM | POA: Diagnosis not present

## 2013-04-24 DIAGNOSIS — M25559 Pain in unspecified hip: Secondary | ICD-10-CM | POA: Diagnosis not present

## 2013-05-23 DIAGNOSIS — M25569 Pain in unspecified knee: Secondary | ICD-10-CM | POA: Diagnosis not present

## 2013-05-29 ENCOUNTER — Ambulatory Visit
Admission: RE | Admit: 2013-05-29 | Discharge: 2013-05-29 | Disposition: A | Discharge status: Home / Self Care | Payer: Medicare Other | Source: Ambulatory Visit

## 2013-05-29 ENCOUNTER — Encounter (INDEPENDENT_AMBULATORY_CARE_PROVIDER_SITE_OTHER): Payer: Self-pay

## 2013-05-29 DIAGNOSIS — Z853 Personal history of malignant neoplasm of breast: Secondary | ICD-10-CM

## 2013-05-29 DIAGNOSIS — Z1231 Encounter for screening mammogram for malignant neoplasm of breast: Secondary | ICD-10-CM

## 2013-05-30 DIAGNOSIS — L819 Disorder of pigmentation, unspecified: Secondary | ICD-10-CM | POA: Diagnosis not present

## 2013-05-30 DIAGNOSIS — L820 Inflamed seborrheic keratosis: Secondary | ICD-10-CM | POA: Diagnosis not present

## 2013-06-21 ENCOUNTER — Other Ambulatory Visit (HOSPITAL_COMMUNITY): Payer: Self-pay | Admitting: Cardiology

## 2013-06-21 ENCOUNTER — Other Ambulatory Visit (HOSPITAL_COMMUNITY): Payer: Medicare Other

## 2013-06-21 ENCOUNTER — Ambulatory Visit (HOSPITAL_COMMUNITY): Payer: Medicare Other | Attending: Cardiology | Admitting: Radiology

## 2013-06-21 DIAGNOSIS — I059 Rheumatic mitral valve disease, unspecified: Secondary | ICD-10-CM | POA: Insufficient documentation

## 2013-06-21 DIAGNOSIS — I359 Nonrheumatic aortic valve disorder, unspecified: Secondary | ICD-10-CM | POA: Diagnosis not present

## 2013-06-21 DIAGNOSIS — I350 Nonrheumatic aortic (valve) stenosis: Secondary | ICD-10-CM

## 2013-06-21 NOTE — Progress Notes (Signed)
Echocardiogram performed.  

## 2013-06-22 ENCOUNTER — Other Ambulatory Visit: Payer: Self-pay | Admitting: *Deleted

## 2013-06-22 DIAGNOSIS — I498 Other specified cardiac arrhythmias: Secondary | ICD-10-CM

## 2013-06-22 DIAGNOSIS — I350 Nonrheumatic aortic (valve) stenosis: Secondary | ICD-10-CM

## 2013-07-13 DIAGNOSIS — M1610 Unilateral primary osteoarthritis, unspecified hip: Secondary | ICD-10-CM | POA: Diagnosis not present

## 2013-07-13 DIAGNOSIS — M25559 Pain in unspecified hip: Secondary | ICD-10-CM | POA: Diagnosis not present

## 2013-08-02 DIAGNOSIS — M899 Disorder of bone, unspecified: Secondary | ICD-10-CM | POA: Diagnosis not present

## 2013-08-02 DIAGNOSIS — E7800 Pure hypercholesterolemia, unspecified: Secondary | ICD-10-CM | POA: Diagnosis not present

## 2013-08-02 DIAGNOSIS — N183 Chronic kidney disease, stage 3 unspecified: Secondary | ICD-10-CM | POA: Diagnosis not present

## 2013-08-02 DIAGNOSIS — I519 Heart disease, unspecified: Secondary | ICD-10-CM | POA: Diagnosis not present

## 2013-08-02 DIAGNOSIS — I062 Rheumatic aortic stenosis with insufficiency: Secondary | ICD-10-CM | POA: Diagnosis not present

## 2013-08-02 DIAGNOSIS — E039 Hypothyroidism, unspecified: Secondary | ICD-10-CM | POA: Diagnosis not present

## 2013-08-02 DIAGNOSIS — Z1331 Encounter for screening for depression: Secondary | ICD-10-CM | POA: Diagnosis not present

## 2013-08-02 DIAGNOSIS — E559 Vitamin D deficiency, unspecified: Secondary | ICD-10-CM | POA: Diagnosis not present

## 2013-08-02 DIAGNOSIS — Z01810 Encounter for preprocedural cardiovascular examination: Secondary | ICD-10-CM | POA: Diagnosis not present

## 2013-08-02 DIAGNOSIS — M949 Disorder of cartilage, unspecified: Secondary | ICD-10-CM | POA: Diagnosis not present

## 2013-08-02 DIAGNOSIS — Z01818 Encounter for other preprocedural examination: Secondary | ICD-10-CM | POA: Diagnosis not present

## 2013-08-15 ENCOUNTER — Encounter (HOSPITAL_COMMUNITY): Payer: Self-pay | Admitting: Pharmacy Technician

## 2013-08-19 NOTE — H&P (Signed)
TOTAL HIP ADMISSION H&P  Patient is admitted for right total hip arthroplasty, anterior approach.  Subjective:  Chief Complaint:     Right hip OA / pain  HPI: [name], 80 y.o. female, has a history of pain and functional disability in the right hip(s) due to arthritis and patient has failed non-surgical conservative treatments for greater than 12 weeks to include NSAID's and/or analgesics, corticosteriod injections and activity modification.  Onset of symptoms was gradual starting 1+ years ago with gradually worsening course since that time.The patient noted no past surgery on the right hip(s).  Patient currently rates pain in the right hip at 9 out of 10 with activity. Patient has night pain, worsening of pain with activity and weight bearing, trendelenberg gait, pain that interfers with activities of daily living and pain with passive range of motion. Patient has evidence of periarticular osteophytes and joint space narrowing by imaging studies. This condition presents safety issues increasing the risk of falls.  There is no current active infection.  Risks, benefits and expectations were discussed with the patient.  Risks including but not limited to the risk of anesthesia, blood clots, nerve damage, blood vessel damage, failure of the prosthesis, infection and up to and including death.  Patient understand the risks, benefits and expectations and wishes to proceed with surgery.   PCP: Simona Huh, MD  D/C Plans:      Home with HHPT  Post-op Meds:       No Rx given   Tranexamic Acid:      To be given - topical (CAD)    Decadron:      Is to be given  FYI:     ASA post-op  Norco post-op   Patient Active Problem List   Diagnosis Date Noted  . Chronic diastolic heart failure 92/11/9415  . Aortic stenosis   . SVT (supraventricular tachycardia) 10/24/2012   Past Medical History  Diagnosis Date  . Coronary artery disease   . Cancer   . Hypercholesteremia   . Hypothyroidism    . Osteopenia   . PUD (peptic ulcer disease)   . Breast cancer, right breast   . Rheumatic fever     with mild to moderate aortic stenosis and mild AI and grade 2 diastolic dysfunction by echol 08/2011  . Atypical chest pain   . CKD (chronic kidney disease), stage III   . SVT (supraventricular tachycardia) October 2014    converted with vagal maneuver  . Vitamin D deficiency   . Aortic stenosis   . Diastolic dysfunction     Past Surgical History  Procedure Laterality Date  . Thyroid surgery    . Total hip arthroplasty    . Replacement total knee    . Breast surgery      No prescriptions prior to admission   Allergies  Allergen Reactions  . Atorvastatin     Myalgias 10/2011  . Compazine [Prochlorperazine]     unknown  . Fenofibrate     Rash 09/2011  . Floxin [Ofloxacin]     unknown  . Pravastatin Sodium     mylgias 09/2011  . Simvastatin     Elevated lft's 06/2011    History  Substance Use Topics  . Smoking status: Never Smoker   . Smokeless tobacco: Not on file  . Alcohol Use: No    Family History  Problem Relation Age of Onset  . Heart disease Mother   . Heart disease Father  Review of Systems  Constitutional: Negative.   HENT: Negative.   Eyes: Negative.   Respiratory: Positive for shortness of breath (on exertion).   Cardiovascular: Negative.   Gastrointestinal: Negative.   Genitourinary: Positive for urgency and frequency.  Musculoskeletal: Positive for joint pain.  Skin: Negative.   Neurological: Negative.   Endo/Heme/Allergies: Negative.   Psychiatric/Behavioral: Negative.     Objective:  Physical Exam  Constitutional: She is oriented to person, place, and time. She appears well-developed and well-nourished.  HENT:  Head: Normocephalic and atraumatic.  Mouth/Throat: Oropharynx is clear and moist.  Eyes: Pupils are equal, round, and reactive to light.  Neck: Neck supple. No JVD present. No tracheal deviation present. No thyromegaly  present.  Cardiovascular: Normal rate, regular rhythm, normal heart sounds and intact distal pulses.   Respiratory: Effort normal and breath sounds normal. No stridor. No respiratory distress. She has no wheezes.  GI: Soft. There is no tenderness. There is no guarding.  Musculoskeletal:       Right hip: She exhibits decreased range of motion, decreased strength, tenderness and bony tenderness. She exhibits no swelling, no deformity and no laceration.  Lymphadenopathy:    She has no cervical adenopathy.  Neurological: She is alert and oriented to person, place, and time.  Skin: Skin is warm and dry.  Psychiatric: She has a normal mood and affect.     Labs:  Estimated body mass index is 33.26 kg/(m^2) as calculated from the following:   Height as of 02/13/13: 5' 3.5" (1.613 m).   Weight as of 02/13/13: 86.546 kg (190 lb 12.8 oz).   Imaging Review Plain radiographs demonstrate severe degenerative joint disease of the right hip(s). The bone quality appears to be good for age and reported activity level.  Assessment/Plan:  End stage arthritis, right hip(s)  The patient history, physical examination, clinical judgement of the provider and imaging studies are consistent with end stage degenerative joint disease of the right hip(s) and total hip arthroplasty is deemed medically necessary. The treatment options including medical management, injection therapy, arthroscopy and arthroplasty were discussed at length. The risks and benefits of total hip arthroplasty were presented and reviewed. The risks due to aseptic loosening, infection, stiffness, dislocation/subluxation,  thromboembolic complications and other imponderables were discussed.  The patient acknowledged the explanation, agreed to proceed with the plan and consent was signed. Patient is being admitted for inpatient treatment for surgery, pain control, PT, OT, prophylactic antibiotics, VTE prophylaxis, progressive ambulation and ADL's and  discharge planning.The patient is planning to be discharged home with home health services.     [place] Pugh Jaques Mineer   PA-C  08/19/2013, 6:04 PM

## 2013-08-20 NOTE — Patient Instructions (Addendum)
YOUR SURGERY IS SCHEDULED AT [facility]  ON:  Tuesday  8/25  REPORT TO  SHORT STAY CENTER AT:  6:00 AM   PLEASE COME IN THE [place] ENTRANCE AND FOLLOW SIGNS TO SHORT STAY CENTER.  DO NOT EAT OR DRINK ANYTHING AFTER MIDNIGHT THE NIGHT BEFORE YOUR SURGERY.  YOU MAY BRUSH YOUR TEETH, RINSE OUT YOUR MOUTH--BUT NO WATER, NO FOOD, NO CHEWING GUM, NO MINTS, NO CANDIES, NO CHEWING TOBACCO.  PLEASE TAKE THE FOLLOWING MEDICATIONS THE AM OF YOUR SURGERY WITH A FEW SIPS OF WATER:  PREVACID, SYNTHROID  DO NOT BRING VALUABLES, MONEY, CREDIT CARDS.  DO NOT WEAR JEWELRY, MAKE-UP, NAIL POLISH AND NO METAL PINS OR CLIPS IN YOUR HAIR. CONTACT LENS, DENTURES / PARTIALS, GLASSES SHOULD NOT BE WORN TO SURGERY AND IN MOST CASES-HEARING AIDS WILL NEED TO BE REMOVED.  BRING YOUR GLASSES CASE, ANY EQUIPMENT NEEDED FOR YOUR CONTACT LENS. FOR PATIENTS ADMITTED TO THE HOSPITAL--CHECK OUT TIME THE DAY OF DISCHARGE IS 11:00 AM.  ALL INPATIENT ROOMS ARE PRIVATE - WITH BATHROOM, TELEPHONE, TELEVISION AND WIFI INTERNET.                                                    PLEASE READ OVER ANY  FACT SHEETS THAT YOU WERE GIVEN: MRSA INFORMATION, BLOOD TRANSFUSION INFORMATION, INCENTIVE SPIROMETER INFORMATION.  PLEASE BE AWARE THAT YOU MAY NEED ADDITIONAL BLOOD DRAWN DAY OF YOUR SURGERY  _______________________________________________________________________   [facility] - Preparing for Surgery Before surgery, you can play an important role.  Because skin is not sterile, your skin needs to be as free of germs as possible.  You can reduce the number of germs on your skin by washing with CHG (chlorahexidine gluconate) soap before surgery.  CHG is an antiseptic cleaner which kills germs and bonds with the skin to continue killing germs even after washing. Please DO NOT use if you have an allergy to CHG or antibacterial soaps.  If your skin becomes reddened/irritated stop using the CHG and  inform your nurse when you arrive at Short Stay. Do not shave (including legs and underarms) for at least 48 hours prior to the first CHG shower.  You may shave your face/neck. Please follow these instructions carefully:  1.  Shower with CHG Soap the night before surgery and the  morning of Surgery.  2.  If you choose to wash your hair, wash your hair first as usual with your  normal  shampoo.  3.  After you shampoo, rinse your hair and body thoroughly to remove the  shampoo.                           4.  Use CHG as you would any other liquid soap.  You can apply chg directly  to the skin and wash                       Gently with a scrungie or clean washcloth.  5.  Apply the CHG Soap to your body ONLY FROM THE NECK DOWN.   Do not use on face/ open  Wound or open sores. Avoid contact with eyes, ears mouth and genitals (private parts).                       Wash face,  Genitals (private parts) with your normal soap.             6.  Wash thoroughly, paying special attention to the area where your surgery  will be performed.  7.  Thoroughly rinse your body with warm water from the neck down.  8.  DO NOT shower/wash with your normal soap after using and rinsing off  the CHG Soap.                9.  Pat yourself dry with a clean towel.            10.  Wear clean pajamas.            11.  Place clean sheets on your bed the night of your first shower and do not  sleep with pets. Day of Surgery : Do not apply any lotions/deodorants the morning of surgery.  Please wear clean clothes to the hospital/surgery center.  FAILURE TO FOLLOW THESE INSTRUCTIONS MAY RESULT IN THE CANCELLATION OF YOUR SURGERY PATIENT SIGNATURE_________________________________  NURSE SIGNATURE__________________________________  ________________________________________________________________________   [name]  An incentive spirometer is a tool that can help keep your lungs clear and  active. This tool measures how well you are filling your lungs with each breath. Taking long deep breaths may help reverse or decrease the chance of developing breathing (pulmonary) problems (especially infection) following:  A long period of time when you are unable to move or be active. BEFORE THE PROCEDURE   If the spirometer includes an indicator to show your best effort, your nurse or respiratory therapist will set it to a desired goal.  If possible, sit up straight or lean slightly forward. Try not to slouch.  Hold the incentive spirometer in an upright position. INSTRUCTIONS FOR USE  1. Sit on the edge of your bed if possible, or sit up as far as you can in bed or on a chair. 2. Hold the incentive spirometer in an upright position. 3. Breathe out normally. 4. Place the mouthpiece in your mouth and seal your lips tightly around it. 5. Breathe in slowly and as deeply as possible, raising the piston or the ball toward the top of the column. 6. Hold your breath for 3-5 seconds or for as long as possible. Allow the piston or ball to fall to the bottom of the column. 7. Remove the mouthpiece from your mouth and breathe out normally. 8. Rest for a few seconds and repeat Steps 1 through 7 at least 10 times every 1-2 hours when you are awake. Take your time and take a few normal breaths between deep breaths. 9. The spirometer may include an indicator to show your best effort. Use the indicator as a goal to work toward during each repetition. 10. After each set of 10 deep breaths, practice coughing to be sure your lungs are clear. If you have an incision (the cut made at the time of surgery), support your incision when coughing by placing a pillow or rolled up towels firmly against it. Once you are able to get out of bed, walk around indoors and cough well. You may stop using the incentive spirometer when instructed by your caregiver.  RISKS AND COMPLICATIONS  Take your time so you do not get  dizzy or light-headed.  If you are in pain, you may need to take or ask for pain medication before doing incentive spirometry. It is harder to take a deep breath if you are having pain. AFTER USE  Rest and breathe slowly and easily.  It can be helpful to keep track of a log of your progress. Your caregiver can provide you with a simple table to help with this. If you are using the spirometer at home, follow these instructions: [place] IF:   You are having difficultly using the spirometer.  You have trouble using the spirometer as often as instructed.  Your pain medication is not giving enough relief while using the spirometer.  You develop fever of 100.5 F (38.1 C) or higher. SEEK IMMEDIATE MEDICAL CARE IF:   You cough up bloody sputum that had not been present before.  You develop fever of 102 F (38.9 C) or greater.  You develop worsening pain at or near the incision site. MAKE SURE YOU:   Understand these instructions.  Will watch your condition.  Will get help right away if you are not doing well or get worse. Document Released: 05/03/2006 Document Revised: 03/15/2011 Document Reviewed: 07/04/2006 Exit[employer] Patient Information 2014 [employer], [place].   ________________________________________________________________________  WHAT IS A BLOOD TRANSFUSION? Blood Transfusion Information  A transfusion is the replacement of blood or some of its parts. Blood is made up of multiple cells which provide different functions.  Red blood cells carry oxygen and are used for blood loss replacement.  White blood cells fight against infection.  Platelets control bleeding.  Plasma helps clot blood.  Other blood products are available for specialized needs, such as hemophilia or other clotting disorders. BEFORE THE TRANSFUSION  Who gives blood for transfusions?   Healthy volunteers who are fully evaluated to make sure their blood is safe. This is blood bank  blood. Transfusion therapy is the safest it has ever been in the practice of medicine. Before blood is taken from a donor, a complete history is taken to make sure that person has no history of diseases nor engages in risky social behavior (examples are intravenous drug use or sexual activity with multiple partners). The donor's travel history is screened to minimize risk of transmitting infections, such as malaria. The donated blood is tested for signs of infectious diseases, such as HIV and hepatitis. The blood is then tested to be sure it is compatible with you in order to minimize the chance of a transfusion reaction. If you or a relative donates blood, this is often done in anticipation of surgery and is not appropriate for emergency situations. It takes many days to process the donated blood. RISKS AND COMPLICATIONS Although transfusion therapy is very safe and saves many lives, the main dangers of transfusion include:   Getting an infectious disease.  Developing a transfusion reaction. This is an allergic reaction to something in the blood you were given. Every precaution is taken to prevent this. The decision to have a blood transfusion has been considered carefully by your caregiver before blood is given. Blood is not given unless the benefits outweigh the risks. AFTER THE TRANSFUSION  Right after receiving a blood transfusion, you will usually feel much better and more energetic. This is especially true if your red blood cells have gotten low (anemic). The transfusion raises the level of the red blood cells which carry oxygen, and this usually causes an energy increase.  The nurse administering the transfusion will monitor  you carefully for complications. HOME CARE INSTRUCTIONS  No special instructions are needed after a transfusion. You may find your energy is better. Speak with your caregiver about any limitations on activity for underlying diseases you may have. SEEK MEDICAL CARE IF:    Your condition is not improving after your transfusion.  You develop redness or irritation at the intravenous (IV) site. SEEK IMMEDIATE MEDICAL CARE IF:  Any of the following symptoms occur over the next 12 hours:  Shaking chills.  You have a temperature by mouth above 102 F (38.9 C), not controlled by medicine.  Chest, back, or muscle pain.  People around you feel you are not acting correctly or are confused.  Shortness of breath or difficulty breathing.  Dizziness and fainting.  You get a rash or develop hives.  You have a decrease in urine output.  Your urine turns a dark color or changes to pink, red, or brown. Any of the following symptoms occur over the next 10 days:  You have a temperature by mouth above 102 F (38.9 C), not controlled by medicine.  Shortness of breath.  Weakness after normal activity.  The white part of the eye turns yellow (jaundice).  You have a decrease in the amount of urine or are urinating less often.  Your urine turns a dark color or changes to pink, red, or brown. Document Released: 12/19/1999 Document Revised: 03/15/2011 Document Reviewed: 08/07/2007 [facility] Patient Information 2014 [place], [place].  _______________________________________________________________________

## 2013-08-21 ENCOUNTER — Encounter (HOSPITAL_COMMUNITY): Payer: Self-pay

## 2013-08-21 ENCOUNTER — Encounter (HOSPITAL_COMMUNITY)
Admission: RE | Admit: 2013-08-21 | Discharge: 2013-08-21 | Disposition: A | Discharge status: Home / Self Care | Payer: Medicare Other | Source: Ambulatory Visit | Attending: Orthopedic Surgery | Admitting: Orthopedic Surgery

## 2013-08-21 DIAGNOSIS — M1610 Unilateral primary osteoarthritis, unspecified hip: Secondary | ICD-10-CM | POA: Insufficient documentation

## 2013-08-21 DIAGNOSIS — Z01818 Encounter for other preprocedural examination: Secondary | ICD-10-CM | POA: Insufficient documentation

## 2013-08-21 DIAGNOSIS — M169 Osteoarthritis of hip, unspecified: Secondary | ICD-10-CM | POA: Insufficient documentation

## 2013-08-21 LAB — URINALYSIS, ROUTINE W REFLEX MICROSCOPIC
Bilirubin Urine: NEGATIVE
Glucose, UA: NEGATIVE mg/dL
Hgb urine dipstick: NEGATIVE
Ketones, ur: NEGATIVE mg/dL
Leukocytes, UA: NEGATIVE
Nitrite: NEGATIVE
Protein, ur: NEGATIVE mg/dL
SPECIFIC GRAVITY, URINE: 1.023 (ref 1.005–1.030)
UROBILINOGEN UA: 0.2 mg/dL (ref 0.0–1.0)
pH: 8.5 — ABNORMAL HIGH (ref 5.0–8.0)

## 2013-08-21 LAB — URINE MICROSCOPIC-ADD ON

## 2013-08-21 LAB — APTT: aPTT: 31 seconds (ref 24–37)

## 2013-08-21 LAB — PROTIME-INR
INR: 1.01 (ref 0.00–1.49)
Prothrombin Time: 13.3 seconds (ref 11.6–15.2)

## 2013-08-21 LAB — SURGICAL PCR SCREEN
MRSA, PCR: INVALID — AB
Staphylococcus aureus: INVALID — AB

## 2013-08-21 NOTE — Pre-Procedure Instructions (Signed)
CXR REPORT IN EPIC FROM 10-21-12. EKG, CBC, BMET REPORTS, MEDICAL CLEARANCE FOR SURGERY ON CHART FROM DR. EHINGER. CARDIOLOGY OFFICE NOTE 02-13-13 DR. T. TURNER AND ECHO REPORT 06-22-13 IN EPIC.

## 2013-08-23 LAB — MRSA CULTURE

## 2013-08-28 ENCOUNTER — Inpatient Hospital Stay (HOSPITAL_COMMUNITY)
Admission: RE | Admit: 2013-08-28 | Discharge: 2013-08-30 | DRG: 470 | Disposition: A | Discharge status: Home Health | Payer: Medicare Other | Source: Ambulatory Visit | Attending: Orthopedic Surgery | Admitting: Orthopedic Surgery

## 2013-08-28 ENCOUNTER — Encounter (HOSPITAL_COMMUNITY): Payer: Self-pay | Admitting: *Deleted

## 2013-08-28 ENCOUNTER — Encounter (HOSPITAL_COMMUNITY): Payer: Medicare Other | Admitting: Registered Nurse

## 2013-08-28 ENCOUNTER — Inpatient Hospital Stay (HOSPITAL_COMMUNITY): Payer: Medicare Other

## 2013-08-28 ENCOUNTER — Inpatient Hospital Stay (HOSPITAL_COMMUNITY): Payer: Medicare Other | Admitting: Registered Nurse

## 2013-08-28 ENCOUNTER — Encounter (HOSPITAL_COMMUNITY)
Admission: RE | Disposition: A | Discharge status: Home Health | Payer: Self-pay | Source: Ambulatory Visit | Attending: Orthopedic Surgery

## 2013-08-28 DIAGNOSIS — I359 Nonrheumatic aortic valve disorder, unspecified: Secondary | ICD-10-CM | POA: Diagnosis present

## 2013-08-28 DIAGNOSIS — I2510 Atherosclerotic heart disease of native coronary artery without angina pectoris: Secondary | ICD-10-CM | POA: Diagnosis not present

## 2013-08-28 DIAGNOSIS — Z888 Allergy status to other drugs, medicaments and biological substances status: Secondary | ICD-10-CM | POA: Diagnosis not present

## 2013-08-28 DIAGNOSIS — M1610 Unilateral primary osteoarthritis, unspecified hip: Principal | ICD-10-CM | POA: Diagnosis present

## 2013-08-28 DIAGNOSIS — Z881 Allergy status to other antibiotic agents status: Secondary | ICD-10-CM | POA: Diagnosis not present

## 2013-08-28 DIAGNOSIS — M25559 Pain in unspecified hip: Secondary | ICD-10-CM | POA: Diagnosis not present

## 2013-08-28 DIAGNOSIS — Z8249 Family history of ischemic heart disease and other diseases of the circulatory system: Secondary | ICD-10-CM

## 2013-08-28 DIAGNOSIS — M949 Disorder of cartilage, unspecified: Secondary | ICD-10-CM

## 2013-08-28 DIAGNOSIS — E039 Hypothyroidism, unspecified: Secondary | ICD-10-CM | POA: Diagnosis present

## 2013-08-28 DIAGNOSIS — Z96649 Presence of unspecified artificial hip joint: Secondary | ICD-10-CM | POA: Diagnosis not present

## 2013-08-28 DIAGNOSIS — Z6832 Body mass index (BMI) 32.0-32.9, adult: Secondary | ICD-10-CM

## 2013-08-28 DIAGNOSIS — C50919 Malignant neoplasm of unspecified site of unspecified female breast: Secondary | ICD-10-CM | POA: Diagnosis not present

## 2013-08-28 DIAGNOSIS — E7800 Pure hypercholesterolemia, unspecified: Secondary | ICD-10-CM | POA: Diagnosis present

## 2013-08-28 DIAGNOSIS — N183 Chronic kidney disease, stage 3 unspecified: Secondary | ICD-10-CM | POA: Diagnosis present

## 2013-08-28 DIAGNOSIS — M899 Disorder of bone, unspecified: Secondary | ICD-10-CM | POA: Diagnosis present

## 2013-08-28 DIAGNOSIS — Z8711 Personal history of peptic ulcer disease: Secondary | ICD-10-CM | POA: Diagnosis not present

## 2013-08-28 DIAGNOSIS — Z471 Aftercare following joint replacement surgery: Secondary | ICD-10-CM | POA: Diagnosis not present

## 2013-08-28 DIAGNOSIS — M169 Osteoarthritis of hip, unspecified: Principal | ICD-10-CM | POA: Diagnosis present

## 2013-08-28 DIAGNOSIS — I5032 Chronic diastolic (congestive) heart failure: Secondary | ICD-10-CM | POA: Diagnosis present

## 2013-08-28 DIAGNOSIS — D62 Acute posthemorrhagic anemia: Secondary | ICD-10-CM | POA: Diagnosis not present

## 2013-08-28 DIAGNOSIS — D500 Iron deficiency anemia secondary to blood loss (chronic): Secondary | ICD-10-CM | POA: Diagnosis not present

## 2013-08-28 DIAGNOSIS — E669 Obesity, unspecified: Secondary | ICD-10-CM | POA: Diagnosis present

## 2013-08-28 DIAGNOSIS — Z853 Personal history of malignant neoplasm of breast: Secondary | ICD-10-CM | POA: Diagnosis not present

## 2013-08-28 DIAGNOSIS — Z01812 Encounter for preprocedural laboratory examination: Secondary | ICD-10-CM

## 2013-08-28 DIAGNOSIS — I509 Heart failure, unspecified: Secondary | ICD-10-CM | POA: Diagnosis present

## 2013-08-28 HISTORY — PX: TOTAL HIP ARTHROPLASTY: SHX124

## 2013-08-28 LAB — TYPE AND SCREEN
ABO/RH(D): A POS
ANTIBODY SCREEN: NEGATIVE

## 2013-08-28 SURGERY — ARTHROPLASTY, HIP, TOTAL, ANTERIOR APPROACH
Anesthesia: General | Site: Hip | Laterality: Right

## 2013-08-28 MED ORDER — MENTHOL 3 MG MT LOZG
1.0000 | LOZENGE | OROMUCOSAL | Status: DC | PRN
Start: 2013-08-28 — End: 2013-08-30
  Filled 2013-08-28: qty 9

## 2013-08-28 MED ORDER — PROPOFOL 10 MG/ML IV BOLUS
INTRAVENOUS | Status: DC | PRN
  Administered 2013-08-28: 140 mg via INTRAVENOUS
  Administered 2013-08-28: 30 mg via INTRAVENOUS

## 2013-08-28 MED ORDER — FENTANYL CITRATE 0.05 MG/ML IJ SOLN
INTRAMUSCULAR | Status: AC
  Filled 2013-08-28: qty 2

## 2013-08-28 MED ORDER — MEPERIDINE HCL 50 MG/ML IJ SOLN
6.2500 mg | INTRAMUSCULAR | Status: DC | PRN
Start: 2013-08-28 — End: 2013-08-28

## 2013-08-28 MED ORDER — ASPIRIN EC 325 MG PO TBEC
325.0000 mg | DELAYED_RELEASE_TABLET | Freq: Two times a day (BID) | ORAL | Status: DC
  Administered 2013-08-29 – 2013-08-30 (×3): 325 mg via ORAL
  Filled 2013-08-28 (×5): qty 1

## 2013-08-28 MED ORDER — DEXAMETHASONE SODIUM PHOSPHATE 10 MG/ML IJ SOLN
INTRAMUSCULAR | Status: AC
  Filled 2013-08-28: qty 1

## 2013-08-28 MED ORDER — LACTATED RINGERS IV SOLN
INTRAVENOUS | Status: DC | PRN
  Administered 2013-08-28 (×2): via INTRAVENOUS

## 2013-08-28 MED ORDER — DIPHENHYDRAMINE HCL 25 MG PO CAPS: 25.0000 mg | ORAL_CAPSULE | Freq: Four times a day (QID) | ORAL | Status: DC | PRN

## 2013-08-28 MED ORDER — DOCUSATE SODIUM 100 MG PO CAPS
100.0000 mg | ORAL_CAPSULE | Freq: Two times a day (BID) | ORAL | Status: DC
  Administered 2013-08-28 – 2013-08-30 (×4): 100 mg via ORAL

## 2013-08-28 MED ORDER — ROCURONIUM BROMIDE 100 MG/10ML IV SOLN
INTRAVENOUS | Status: DC | PRN
  Administered 2013-08-28: 40 mg via INTRAVENOUS

## 2013-08-28 MED ORDER — PROPOFOL 10 MG/ML IV BOLUS
INTRAVENOUS | Status: AC
  Filled 2013-08-28: qty 20

## 2013-08-28 MED ORDER — METHOCARBAMOL 500 MG PO TABS
500.0000 mg | ORAL_TABLET | Freq: Four times a day (QID) | ORAL | Status: DC | PRN
  Administered 2013-08-29 – 2013-08-30 (×3): 500 mg via ORAL
  Filled 2013-08-28 (×3): qty 1

## 2013-08-28 MED ORDER — ROCURONIUM BROMIDE 100 MG/10ML IV SOLN
INTRAVENOUS | Status: AC
  Filled 2013-08-28: qty 1

## 2013-08-28 MED ORDER — LIDOCAINE HCL (CARDIAC) 20 MG/ML IV SOLN
INTRAVENOUS | Status: AC
Start: 2013-08-28 — End: 2013-08-28
  Filled 2013-08-28: qty 5

## 2013-08-28 MED ORDER — MAGNESIUM CITRATE PO SOLN: 1.0000 | Freq: Once | ORAL | Status: AC | PRN

## 2013-08-28 MED ORDER — METOCLOPRAMIDE HCL 10 MG PO TABS: 5.0000 mg | ORAL_TABLET | Freq: Three times a day (TID) | ORAL | Status: DC | PRN

## 2013-08-28 MED ORDER — FENTANYL CITRATE 0.05 MG/ML IJ SOLN
25.0000 ug | INTRAMUSCULAR | Status: DC | PRN
  Administered 2013-08-28 (×2): 50 ug via INTRAVENOUS

## 2013-08-28 MED ORDER — GLYCOPYRROLATE 0.2 MG/ML IJ SOLN
INTRAMUSCULAR | Status: DC | PRN
  Administered 2013-08-28: 0.4 mg via INTRAVENOUS
  Administered 2013-08-28: 0.2 mg via INTRAVENOUS

## 2013-08-28 MED ORDER — MAGNESIUM GLUCONATE 500 MG PO TABS
500.0000 mg | ORAL_TABLET | Freq: Two times a day (BID) | ORAL | Status: DC
  Administered 2013-08-28: 500 mg via ORAL
  Administered 2013-08-29: 22:00:00 via ORAL
  Administered 2013-08-29 – 2013-08-30 (×2): 500 mg via ORAL
  Filled 2013-08-28 (×5): qty 1

## 2013-08-28 MED ORDER — NEOSTIGMINE METHYLSULFATE 10 MG/10ML IV SOLN
INTRAVENOUS | Status: DC | PRN
  Administered 2013-08-28: 2 mg via INTRAVENOUS

## 2013-08-28 MED ORDER — METOCLOPRAMIDE HCL 5 MG/ML IJ SOLN: 5.0000 mg | Freq: Three times a day (TID) | INTRAMUSCULAR | Status: DC | PRN

## 2013-08-28 MED ORDER — DEXAMETHASONE SODIUM PHOSPHATE 10 MG/ML IJ SOLN
10.0000 mg | Freq: Once | INTRAMUSCULAR | Status: AC
  Administered 2013-08-28: 10 mg via INTRAVENOUS

## 2013-08-28 MED ORDER — SUCCINYLCHOLINE CHLORIDE 20 MG/ML IJ SOLN
INTRAMUSCULAR | Status: DC | PRN
  Administered 2013-08-28: 100 mg via INTRAVENOUS

## 2013-08-28 MED ORDER — FUROSEMIDE 20 MG PO TABS
20.0000 mg | ORAL_TABLET | Freq: Every day | ORAL | Status: DC | PRN
  Filled 2013-08-28: qty 1

## 2013-08-28 MED ORDER — METHOCARBAMOL 1000 MG/10ML IJ SOLN
500.0000 mg | Freq: Four times a day (QID) | INTRAVENOUS | Status: DC | PRN
  Administered 2013-08-28: 500 mg via INTRAVENOUS
  Filled 2013-08-28: qty 5

## 2013-08-28 MED ORDER — DEXAMETHASONE SODIUM PHOSPHATE 10 MG/ML IJ SOLN
10.0000 mg | Freq: Once | INTRAMUSCULAR | Status: AC
Start: 2013-08-29 — End: 2013-08-29
  Administered 2013-08-29: 10 mg via INTRAVENOUS
  Filled 2013-08-28: qty 1

## 2013-08-28 MED ORDER — CELECOXIB 200 MG PO CAPS
200.0000 mg | ORAL_CAPSULE | Freq: Two times a day (BID) | ORAL | Status: DC
  Administered 2013-08-28 – 2013-08-30 (×4): 200 mg via ORAL
  Filled 2013-08-28 (×5): qty 1

## 2013-08-28 MED ORDER — PANTOPRAZOLE SODIUM 40 MG PO TBEC
40.0000 mg | DELAYED_RELEASE_TABLET | Freq: Every day | ORAL | Status: DC
  Administered 2013-08-28 – 2013-08-30 (×3): 40 mg via ORAL
  Filled 2013-08-28 (×3): qty 1

## 2013-08-28 MED ORDER — BISACODYL 10 MG RE SUPP: 10.0000 mg | Freq: Every day | RECTAL | Status: DC | PRN

## 2013-08-28 MED ORDER — SODIUM CHLORIDE 0.9 % IV SOLN
2000.0000 mg | Freq: Once | INTRAVENOUS | Status: DC
  Filled 2013-08-28: qty 20

## 2013-08-28 MED ORDER — HYDROMORPHONE HCL PF 1 MG/ML IJ SOLN
0.5000 mg | INTRAMUSCULAR | Status: DC | PRN
  Administered 2013-08-28: 1 mg via INTRAVENOUS

## 2013-08-28 MED ORDER — MIDAZOLAM HCL 2 MG/2ML IJ SOLN
INTRAMUSCULAR | Status: AC
  Filled 2013-08-28: qty 2

## 2013-08-28 MED ORDER — ONDANSETRON HCL 4 MG/2ML IJ SOLN: 4.0000 mg | Freq: Four times a day (QID) | INTRAMUSCULAR | Status: DC | PRN

## 2013-08-28 MED ORDER — CHLORHEXIDINE GLUCONATE 4 % EX LIQD: 60.0000 mL | Freq: Once | CUTANEOUS | Status: DC

## 2013-08-28 MED ORDER — PHENOL 1.4 % MT LIQD: 1.0000 | OROMUCOSAL | Status: DC | PRN

## 2013-08-28 MED ORDER — PHENYLEPHRINE HCL 10 MG/ML IJ SOLN
INTRAMUSCULAR | Status: AC
  Filled 2013-08-28: qty 1

## 2013-08-28 MED ORDER — HYDROCODONE-ACETAMINOPHEN 7.5-325 MG PO TABS
1.0000 | ORAL_TABLET | ORAL | Status: DC
  Administered 2013-08-28: 2 via ORAL
  Administered 2013-08-28 – 2013-08-30 (×9): 1 via ORAL
  Filled 2013-08-28 (×2): qty 1
  Filled 2013-08-28 (×2): qty 2
  Filled 2013-08-28 (×6): qty 1

## 2013-08-28 MED ORDER — FENTANYL CITRATE 0.05 MG/ML IJ SOLN
INTRAMUSCULAR | Status: DC | PRN
  Administered 2013-08-28 (×3): 50 ug via INTRAVENOUS
  Administered 2013-08-28: 100 ug via INTRAVENOUS
  Administered 2013-08-28 (×2): 50 ug via INTRAVENOUS
  Administered 2013-08-28: 100 ug via INTRAVENOUS
  Administered 2013-08-28: 50 ug via INTRAVENOUS

## 2013-08-28 MED ORDER — 0.9 % SODIUM CHLORIDE (POUR BTL) OPTIME
TOPICAL | Status: DC | PRN
  Administered 2013-08-28: 1000 mL

## 2013-08-28 MED ORDER — CEFAZOLIN SODIUM-DEXTROSE 2-3 GM-% IV SOLR
INTRAVENOUS | Status: AC
  Filled 2013-08-28: qty 50

## 2013-08-28 MED ORDER — FLUTICASONE PROPIONATE 50 MCG/ACT NA SUSP: 1.0000 | Freq: Every day | NASAL | Status: DC | PRN

## 2013-08-28 MED ORDER — HYDROMORPHONE HCL PF 1 MG/ML IJ SOLN
INTRAMUSCULAR | Status: AC
  Filled 2013-08-28: qty 1

## 2013-08-28 MED ORDER — POLYETHYLENE GLYCOL 3350 17 G PO PACK
17.0000 g | PACK | Freq: Two times a day (BID) | ORAL | Status: DC
  Administered 2013-08-28 – 2013-08-30 (×4): 17 g via ORAL

## 2013-08-28 MED ORDER — FERROUS SULFATE 325 (65 FE) MG PO TABS
325.0000 mg | ORAL_TABLET | Freq: Three times a day (TID) | ORAL | Status: DC
  Administered 2013-08-28 – 2013-08-29 (×2): 325 mg via ORAL
  Filled 2013-08-28 (×8): qty 1

## 2013-08-28 MED ORDER — CEFAZOLIN SODIUM-DEXTROSE 2-3 GM-% IV SOLR
2.0000 g | Freq: Four times a day (QID) | INTRAVENOUS | Status: AC
  Administered 2013-08-28 (×2): 2 g via INTRAVENOUS
  Filled 2013-08-28 (×2): qty 50

## 2013-08-28 MED ORDER — POTASSIUM CHLORIDE 2 MEQ/ML IV SOLN
100.0000 mL/h | INTRAVENOUS | Status: DC
  Administered 2013-08-28: 100 mL/h via INTRAVENOUS
  Filled 2013-08-28 (×5): qty 1000

## 2013-08-28 MED ORDER — ONDANSETRON HCL 4 MG/2ML IJ SOLN
INTRAMUSCULAR | Status: DC | PRN
  Administered 2013-08-28: 4 mg via INTRAVENOUS

## 2013-08-28 MED ORDER — PHENYLEPHRINE HCL 10 MG/ML IJ SOLN
10.0000 mg | INTRAVENOUS | Status: DC | PRN
  Administered 2013-08-28: 20 ug/min via INTRAVENOUS

## 2013-08-28 MED ORDER — POTASSIUM CHLORIDE ER 10 MEQ PO TBCR
10.0000 meq | EXTENDED_RELEASE_TABLET | Freq: Every day | ORAL | Status: DC | PRN
  Filled 2013-08-28: qty 1

## 2013-08-28 MED ORDER — CEFAZOLIN SODIUM-DEXTROSE 2-3 GM-% IV SOLR
2.0000 g | INTRAVENOUS | Status: AC
  Administered 2013-08-28: 2 g via INTRAVENOUS

## 2013-08-28 MED ORDER — LEVOTHYROXINE SODIUM 112 MCG PO TABS
112.0000 ug | ORAL_TABLET | Freq: Every day | ORAL | Status: DC
  Administered 2013-08-29 – 2013-08-30 (×2): 112 ug via ORAL
  Filled 2013-08-28 (×3): qty 1

## 2013-08-28 MED ORDER — ALUM & MAG HYDROXIDE-SIMETH 200-200-20 MG/5ML PO SUSP: 30.0000 mL | ORAL | Status: DC | PRN

## 2013-08-28 MED ORDER — ONDANSETRON HCL 4 MG PO TABS: 4.0000 mg | ORAL_TABLET | Freq: Four times a day (QID) | ORAL | Status: DC | PRN

## 2013-08-28 MED ORDER — FENTANYL CITRATE 0.05 MG/ML IJ SOLN
INTRAMUSCULAR | Status: AC
  Filled 2013-08-28: qty 5

## 2013-08-28 MED ORDER — TRANEXAMIC ACID 100 MG/ML IV SOLN
2000.0000 mg | INTRAVENOUS | Status: DC | PRN
  Administered 2013-08-28: 2000 mg via TOPICAL

## 2013-08-28 MED ORDER — MIDAZOLAM HCL 5 MG/5ML IJ SOLN
INTRAMUSCULAR | Status: DC | PRN
  Administered 2013-08-28 (×2): 0.5 mg via INTRAVENOUS

## 2013-08-28 SURGICAL SUPPLY — 48 items
ADH SKN CLS APL DERMABOND .7 (GAUZE/BANDAGES/DRESSINGS) ×1
BAG DECANTER FOR FLEXI CONT (MISCELLANEOUS) ×1 IMPLANT
BAG SPEC THK2 15X12 ZIP CLS (MISCELLANEOUS)
BAG ZIPLOCK 12X15 (MISCELLANEOUS) IMPLANT
CAPT HIP PF MOP ×1 IMPLANT
COVER PERINEAL POST (MISCELLANEOUS) ×2 IMPLANT
DERMABOND ADVANCED (GAUZE/BANDAGES/DRESSINGS) ×1
DERMABOND ADVANCED .7 DNX12 (GAUZE/BANDAGES/DRESSINGS) ×1 IMPLANT
DRAPE C-ARM 42X120 X-RAY (DRAPES) ×2 IMPLANT
DRAPE STERI IOBAN 125X83 (DRAPES) ×2 IMPLANT
DRAPE U-SHAPE 47X51 STRL (DRAPES) ×6 IMPLANT
DRSG AQUACEL AG ADV 3.5X10 (GAUZE/BANDAGES/DRESSINGS) ×2 IMPLANT
DURAPREP 26ML APPLICATOR (WOUND CARE) ×2 IMPLANT
ELECT BLADE TIP CTD 4 INCH (ELECTRODE) ×2 IMPLANT
ELECT REM PT RETURN 9FT ADLT (ELECTROSURGICAL) ×2
ELECTRODE REM PT RTRN 9FT ADLT (ELECTROSURGICAL) ×1 IMPLANT
FACESHIELD WRAPAROUND (MASK) ×6 IMPLANT
FACESHIELD WRAPAROUND OR TEAM (MASK) ×4 IMPLANT
GLOVE BIO SURGEON STRL SZ8 (GLOVE) ×1 IMPLANT
GLOVE BIOGEL PI IND STRL 6.5 (GLOVE) IMPLANT
GLOVE BIOGEL PI IND STRL 7.5 (GLOVE) ×1 IMPLANT
GLOVE BIOGEL PI IND STRL 8 (GLOVE) ×1 IMPLANT
GLOVE BIOGEL PI IND STRL 8.5 (GLOVE) IMPLANT
GLOVE BIOGEL PI INDICATOR 6.5 (GLOVE) ×1
GLOVE BIOGEL PI INDICATOR 7.5 (GLOVE) ×2
GLOVE BIOGEL PI INDICATOR 8 (GLOVE) ×1
GLOVE BIOGEL PI INDICATOR 8.5 (GLOVE) ×1
GLOVE ECLIPSE 8.0 STRL XLNG CF (GLOVE) ×2 IMPLANT
GLOVE ORTHO TXT STRL SZ7.5 (GLOVE) ×4 IMPLANT
GLOVE SURG SS PI 7.5 STRL IVOR (GLOVE) ×1 IMPLANT
GOWN BRE IMP PREV XXLGXLNG (GOWN DISPOSABLE) ×1 IMPLANT
GOWN SPEC L3 XXLG W/TWL (GOWN DISPOSABLE) ×2 IMPLANT
GOWN STRL REUS W/TWL LRG LVL3 (GOWN DISPOSABLE) ×2 IMPLANT
GOWN STRL REUS W/TWL XL LVL3 (GOWN DISPOSABLE) ×1 IMPLANT
HOLDER FOLEY CATH W/STRAP (MISCELLANEOUS) ×2 IMPLANT
KIT BASIN OR (CUSTOM PROCEDURE TRAY) ×2 IMPLANT
PACK TOTAL JOINT (CUSTOM PROCEDURE TRAY) ×2 IMPLANT
SAW OSC TIP CART 19.5X105X1.3 (SAW) ×2 IMPLANT
SUT MNCRL AB 4-0 PS2 18 (SUTURE) ×2 IMPLANT
SUT VIC AB 1 CT1 36 (SUTURE) ×6 IMPLANT
SUT VIC AB 2-0 CT1 27 (SUTURE) ×4
SUT VIC AB 2-0 CT1 TAPERPNT 27 (SUTURE) ×2 IMPLANT
SUT VLOC 180 0 24IN GS25 (SUTURE) ×2 IMPLANT
SYR 50ML LL SCALE MARK (SYRINGE) ×1 IMPLANT
TOWEL OR 17X26 10 PK STRL BLUE (TOWEL DISPOSABLE) ×2 IMPLANT
TOWEL OR NON WOVEN STRL DISP B (DISPOSABLE) ×1 IMPLANT
TRAY FOLEY CATH 14FRSI W/METER (CATHETERS) ×2 IMPLANT
WATER STERILE IRR 1500ML POUR (IV SOLUTION) ×2 IMPLANT

## 2013-08-28 NOTE — Progress Notes (Signed)
X-ray results noted 

## 2013-08-28 NOTE — Transfer of Care (Signed)
Immediate Anesthesia Transfer of Care Note  Patient: [name]  Procedure(s) Performed: Procedure(s): RIGHT TOTAL HIP ARTHROPLASTY ANTERIOR APPROACH (Right)  Patient Location: PACU  Anesthesia Type:General  Level of Consciousness: awake, alert , oriented and patient cooperative  Airway & Oxygen Therapy: Patient Spontanous Breathing and Patient connected to face mask oxygen  Post-op Assessment: Report given to PACU RN, Post -op Vital signs reviewed and stable and Patient moving all extremities X 4  Post vital signs: stable  Complications: No apparent anesthesia complications

## 2013-08-28 NOTE — Op Note (Signed)
NAME:  [name]                ACCOUNT NO.: [id number]      MEDICAL RECORD NO.: [id number]      FACILITY:  [facility]      PHYSICIAN:  Paralee Cancel D  DATE OF BIRTH:  [birth year]     DATE OF PROCEDURE:  08/28/2013                                 OPERATIVE REPORT         PREOPERATIVE DIAGNOSIS: Right  hip osteoarthritis.      POSTOPERATIVE DIAGNOSIS:  Right hip osteoarthritis.      PROCEDURE:  Right total hip replacement through an anterior approach   utilizing DePuy THR system, component size 20mm pinnacle cup, a size 32+4 neutral   Altrex liner, a size 3 Hi Tri Lock stem with a 32+1 Articuleze metal head ball.      SURGEON:  Pietro Cassis. Alvan Dame, M.D.      ASSISTANT:  Molli Barrows, PA-C      ANESTHESIA:  General.      SPECIMENS:  None.      COMPLICATIONS:  None.      BLOOD LOSS:  600 cc     DRAINS:  None.      INDICATION OF THE PROCEDURE:  [name] is a 80 y.o. female who had   presented to office for evaluation of right hip pain.  Radiographs revealed   progressive degenerative changes with bone-on-bone   articulation to the  hip joint.  The patient had painful limited range of   motion significantly affecting their overall quality of life.  The patient was failing to    respond to conservative measures, and at this point was ready   to proceed with more definitive measures.  The patient has noted progressive   degenerative changes in his hip, progressive problems and dysfunction   with regarding the hip prior to surgery.  Consent was obtained for   benefit of pain relief.  Specific risk of infection, DVT, component   failure, dislocation, need for revision surgery, as well discussion of   the anterior versus posterior approach were reviewed.  Consent was   obtained for benefit of anterior pain relief through an anterior   approach.      PROCEDURE IN DETAIL:  The patient was brought to operative theater.   Once adequate anesthesia,  preoperative antibiotics, 2gm of Ancef administered.   The patient was positioned supine on the OSI Hanna table.  Once adequate   padding of boney process was carried out, we had predraped out the hip, and  used fluoroscopy to confirm orientation of the pelvis and position.      The right hip was then prepped and draped from proximal iliac crest to   mid thigh with shower curtain technique.      Time-out was performed identifying the patient, planned procedure, and   extremity.     An incision was then made 2 cm distal and lateral to the   anterior superior iliac spine extending over the orientation of the   tensor fascia lata muscle and sharp dissection was carried down to the   fascia of the muscle and protractor placed in the soft tissues.      The fascia was then incised.  The muscle belly was identified and swept  laterally and retractor placed along the superior neck.  Following   cauterization of the circumflex vessels and removing some pericapsular   fat, a second cobra retractor was placed on the inferior neck.  A third   retractor was placed on the anterior acetabulum after elevating the   anterior rectus.  A L-capsulotomy was along the line of the   superior neck to the trochanteric fossa, then extended proximally and   distally.  Tag sutures were placed and the retractors were then placed   intracapsular.  We then identified the trochanteric fossa and   orientation of my neck cut, confirmed this radiographically   and then made a neck osteotomy with the femur on traction.  The femoral   head was removed without difficulty or complication.  Traction was let   off and retractors were placed posterior and anterior around the   acetabulum.      The labrum and foveal tissue were debrided.  I began reaming with a 67mm   reamer and reamed up to 67mm reamer with good bony bed preparation and a 38mm   cup was chosen.  The final 11mm Pinnacle cup was then impacted under  fluoroscopy  to confirm the depth of penetration and orientation with respect to   abduction.  A screw was placed followed by the hole eliminator.  The final   32+4 neutral Altrex liner was impacted with good visualized rim fit.  The cup was positioned anatomically within the acetabular portion of the pelvis.      At this point, the femur was rolled at 80 degrees.  Further capsule was   released off the inferior aspect of the femoral neck.  I then   released the superior capsule proximally.  The hook was placed laterally   along the femur and elevated manually and held in position with the bed   hook.  The leg was then extended and adducted with the leg rolled to 100   degrees of external rotation.  Once the proximal femur was fully   exposed, I used a box osteotome to set orientation.  I then began   broaching with the starting chili pepper broach and passed this by hand and then broached up to 3.  With the 3 broach in place I chose a high offset neck and did a trial reduction.  The offset was appropriate, leg lengths   appeared to be equal, confirmed radiographically.   Given these findings, I went ahead and dislocated the hip, repositioned all   retractors and positioned the right hip in the extended and abducted position.  The final 3 Hi Tri Lock stem was   chosen and it was impacted down to the level of neck cut.  Based on this   and the trial reduction, a 32+1 Articuleze metal head ball was chosen and   impacted onto a clean and dry trunnion, and the hip was reduced.  The   hip had been irrigated throughout the case again at this point.  I did   reapproximate the superior capsular leaflet to the anterior leaflet   using #1 Vicryl.  The fascia of the   tensor fascia lata muscle was then reapproximated using #1 Vicryl and #0 V-lock.  We then injected topical dose of Tranexamic Acid (2 gm) in the submuscular layer.  The   remaining wound was closed with 2-0 Vicryl and running 4-0 Monocryl.    The hip was cleaned, dried, and dressed sterilely using Dermabond  and   Aquacel dressing.  She was then brought   to recovery room in stable condition tolerating the procedure well.    Molli Barrows, PA-C was present for the entirety of the case involved from   preoperative positioning, perioperative retractor management, general   facilitation of the case, as well as primary wound closure as assistant.            Pietro Cassis Alvan Dame, M.D.        08/28/2013 10:19 AM

## 2013-08-28 NOTE — Progress Notes (Signed)
CARE MANAGEMENT NOTE 08/28/2013  Patient:  [name], [name]   Account Number:  [id number]  Date Initiated:  08/28/2013  Documentation initiated by:  DAVIS,RHONDA  Subjective/Objective Assessment:   rt hip arthroplasty ant. approach/     Action/Plan:   home with hhc   Anticipated DC Date:  08/31/2013   Anticipated DC Plan:  [place] referral  NA      DC Planning Services  CM consult      [facility] Choice  NA   Choice offered to / List presented to:  C-1 Patient           Status of service:  In process, will continue to follow Medicare Important Message given?  NA - LOS <3 / Initial given by admissions (If response is "NO", the following Medicare IM given date fields will be blank) Date Medicare IM given:   Medicare IM given by:   Date Additional Medicare IM given:   Additional Medicare IM given by:    Discharge Disposition:    Per UR Regulation:  Reviewed for med. necessity/level of care/duration of stay  If discussed at [place] of Stay Meetings, dates discussed:    Comments:  Suanne Marker Davis,RN,BSN,CCM

## 2013-08-28 NOTE — Progress Notes (Signed)
Portable AP Pelvis and Lateral Right Hip X-rays done. 

## 2013-08-28 NOTE — Anesthesia Preprocedure Evaluation (Addendum)
Anesthesia Evaluation  Patient identified by MRN, date of birth, ID band Patient awake    Reviewed: Allergy & Precautions, H&P , NPO status , Patient's Chart, lab work & pertinent test results  Airway Mallampati: II TM Distance: >3 FB Neck ROM: Full    Dental no notable dental hx.    Pulmonary neg pulmonary ROS,  breath sounds clear to auscultation  Pulmonary exam normal       Cardiovascular + CAD + dysrhythmias Supra Ventricular Tachycardia + Valvular Problems/Murmurs ( with mild to moderate aortic stenosis and mild AI and grade 2 diastolic dysfunction by echol 08/2011) AS Rhythm:Regular Rate:Normal     Neuro/Psych negative neurological ROS  negative psych ROS   GI/Hepatic negative GI ROS, Neg liver ROS,   Endo/Other  negative endocrine ROS  Renal/GU Renal InsufficiencyRenal disease  negative genitourinary   Musculoskeletal negative musculoskeletal ROS (+)   Abdominal   Peds negative pediatric ROS (+)  Hematology negative hematology ROS (+)   Anesthesia Other Findings   Reproductive/Obstetrics negative OB ROS                         Anesthesia Physical Anesthesia Plan  ASA: III  Anesthesia Plan: General   Post-op Pain Management:    Induction: Intravenous  Airway Management Planned:   Additional Equipment:   Intra-op Plan:   Post-operative Plan: Extubation in OR  Informed Consent: I have reviewed the patients History and Physical, chart, labs and discussed the procedure including the risks, benefits and alternatives for the proposed anesthesia with the patient or authorized representative who has indicated his/her understanding and acceptance.   Dental advisory given  Plan Discussed with: CRNA  Anesthesia Plan Comments: (Long discussion with pt about GA vs SAB in the setting of AS. Decided on GA. Pt agrees. Will use non-invasive cardiac output monitor.  )         Anesthesia Quick Evaluation

## 2013-08-28 NOTE — Anesthesia Postprocedure Evaluation (Signed)
  Anesthesia Post-op Note  Patient: [name]  Procedure(s) Performed: Procedure(s) (LRB): RIGHT TOTAL HIP ARTHROPLASTY ANTERIOR APPROACH (Right)  Patient Location: PACU  Anesthesia Type: General  Level of Consciousness: awake and alert   Airway and Oxygen Therapy: Patient Spontanous Breathing  Post-op Pain: mild  Post-op Assessment: Post-op Vital signs reviewed, Patient's Cardiovascular Status Stable, Respiratory Function Stable, Patent Airway and No signs of Nausea or vomiting  Last Vitals:  Filed Vitals:   08/28/13 1400  BP: 108/48  Pulse: 63  Temp: 36.6 C  Resp: 18    Post-op Vital Signs: stable   Complications: No apparent anesthesia complications

## 2013-08-28 NOTE — Interval H&P Note (Signed)
History and Physical Interval Note:  08/28/2013 6:42 AM  Sunday Spillers  has presented today for surgery, with the diagnosis of RIGHT HIP OA  The various methods of treatment have been discussed with the patient and family. After consideration of risks, benefits and other options for treatment, the patient has consented to  Procedure(s): RIGHT TOTAL HIP ARTHROPLASTY ANTERIOR APPROACH (Right) as a surgical intervention .  The patient's history has been reviewed, patient examined, no change in status, stable for surgery.  I have reviewed the patient's chart and labs.  Questions were answered to the patient's satisfaction.     Mauri Pole

## 2013-08-28 NOTE — Evaluation (Signed)
Physical Therapy Evaluation Patient Details Name: [name] MRN: [id number] DOB: Nov 15, 1931 Today's Date: 08/28/2013   History of Present Illness  R DATHA  Clinical Impression  Pt ambulated x 50' and tolerated well. Pt will benefit from PT to address problems listed in note below.   Follow Up Recommendations Home health PT;Supervision/Assistance - 24 hour    Equipment Recommendations  None recommended by PT    Recommendations for Other Services       Precautions / Restrictions Precautions Precautions: Fall Restrictions Weight Bearing Restrictions: No      Mobility  Bed Mobility Overal bed mobility: Needs Assistance Bed Mobility: Supine to Sit     Supine to sit: Mod assist;HOB elevated     General bed mobility comments: cues for technique , assist for R leg  Transfers Overall transfer level: Needs assistance Equipment used: Rolling walker (2 wheeled) Transfers: Sit to/from Stand Sit to Stand: Min assist;From elevated surface         General transfer comment: cues for hand R leg position  Ambulation/Gait Ambulation/Gait assistance: Min assist;+2 safety/equipment Ambulation Distance (Feet): 50 Feet Assistive device: Rolling walker (2 wheeled) Gait Pattern/deviations: Step-to pattern;Step-through pattern;Antalgic     General Gait Details: cues for safety, sequence,  Stairs            Wheelchair Mobility    Modified Rankin (Stroke Patients Only)       Balance                                             Pertinent Vitals/Pain Pain Assessment: 0-10 Pain Score: 4  Pain Location: R thigh Pain Descriptors / Indicators: Aching;Burning;Tightness    Home Living Family/patient expects to be discharged to:: Private residence Living Arrangements: Children Available Help at Discharge: Family;Available 24 hours/day Type of Home: House Home Access: Level entry     Home Layout: One level Home Equipment: Walker - 2  wheels;Cane - single point Additional Comments: daughter , an Therapist, sports, to stay with pt.    Prior Function Level of Independence: Independent with assistive device(s)               Hand Dominance        Extremity/Trunk Assessment   Upper Extremity Assessment: Overall WFL for tasks assessed           Lower Extremity Assessment: RLE deficits/detail RLE Deficits / Details: able to advance R leg       Communication   Communication: No difficulties  Cognition Arousal/Alertness: Awake/alert Behavior During Therapy: WFL for tasks assessed/performed Overall Cognitive Status: Within Functional Limits for tasks assessed                      General Comments      Exercises Total Joint Exercises Heel Slides: AAROM;Right;10 reps;Supine      Assessment/Plan    PT Assessment Patient needs continued PT services  PT Diagnosis Difficulty walking;Acute pain   PT Problem List    PT Treatment Interventions DME instruction;Gait training;Functional mobility training;Therapeutic exercise;Therapeutic activities;Patient/family education   PT Goals (Current goals can be found in the Care Plan section) Acute Rehab PT Goals Patient Stated Goal: to get rid of that cane PT Goal Formulation: With patient/family Time For Goal Achievement: 08/31/13 Potential to Achieve Goals: Good    Frequency 7X/week   Barriers to discharge  Co-evaluation               End of Session   Activity Tolerance: Patient tolerated treatment well Patient left: in chair;with call bell/phone within reach;with family/visitor present Nurse Communication: Mobility status         Time: 6295-2841 PT Time Calculation (min): 23 min   Charges:   PT Evaluation $Initial PT Evaluation Tier I: 1 Procedure PT Treatments $Gait Training: 23-37 mins   PT G Codes:          Claretha Cooper 08/28/2013, 6:07 PM Tresa Endo PT [phone]

## 2013-08-29 ENCOUNTER — Encounter (HOSPITAL_COMMUNITY): Payer: Self-pay | Admitting: Orthopedic Surgery

## 2013-08-29 DIAGNOSIS — D500 Iron deficiency anemia secondary to blood loss (chronic): Secondary | ICD-10-CM | POA: Diagnosis not present

## 2013-08-29 DIAGNOSIS — E669 Obesity, unspecified: Secondary | ICD-10-CM | POA: Diagnosis present

## 2013-08-29 LAB — BASIC METABOLIC PANEL
Anion gap: 10 (ref 5–15)
BUN: 24 mg/dL — ABNORMAL HIGH (ref 6–23)
CO2: 27 meq/L (ref 19–32)
CREATININE: 1.1 mg/dL (ref 0.50–1.10)
Calcium: 8.8 mg/dL (ref 8.4–10.5)
Chloride: 97 mEq/L (ref 96–112)
GFR calc Af Amer: 54 mL/min — ABNORMAL LOW (ref >90)
GFR calc non Af Amer: 46 mL/min — ABNORMAL LOW (ref >90)
GLUCOSE: 138 mg/dL — AB (ref 70–99)
Potassium: 4.9 mEq/L (ref 3.7–5.3)
Sodium: 134 mEq/L — ABNORMAL LOW (ref 137–147)

## 2013-08-29 LAB — CBC
HCT: 32.3 % — ABNORMAL LOW (ref 36.0–46.0)
HEMOGLOBIN: 10.8 g/dL — AB (ref 12.0–15.0)
MCH: 31.1 pg (ref 26.0–34.0)
MCHC: 33.4 g/dL (ref 30.0–36.0)
MCV: 93.1 fL (ref 78.0–100.0)
PLATELETS: 192 10*3/uL (ref 150–400)
RBC: 3.47 MIL/uL — AB (ref 3.87–5.11)
RDW: 13.2 % (ref 11.5–15.5)
WBC: 7.3 10*3/uL (ref 4.0–10.5)

## 2013-08-29 MED ORDER — DSS 100 MG PO CAPS: 100.0000 mg | ORAL_CAPSULE | Freq: Two times a day (BID) | ORAL | Status: DC

## 2013-08-29 MED ORDER — FERROUS SULFATE 325 (65 FE) MG PO TABS: 325.0000 mg | ORAL_TABLET | Freq: Three times a day (TID) | ORAL | Status: DC

## 2013-08-29 MED ORDER — HYDROCODONE-ACETAMINOPHEN 7.5-325 MG PO TABS: 1.0000 | ORAL_TABLET | ORAL | Status: DC | PRN

## 2013-08-29 MED ORDER — ASPIRIN 325 MG PO TBEC
325.0000 mg | DELAYED_RELEASE_TABLET | Freq: Two times a day (BID) | ORAL | Status: DC
Start: 2013-08-29 — End: 2013-11-05

## 2013-08-29 MED ORDER — TIZANIDINE HCL 4 MG PO TABS: 4.0000 mg | ORAL_TABLET | Freq: Four times a day (QID) | ORAL | Status: DC | PRN

## 2013-08-29 MED ORDER — POLYETHYLENE GLYCOL 3350 17 G PO PACK: 17.0000 g | PACK | Freq: Two times a day (BID) | ORAL | Status: DC

## 2013-08-29 NOTE — Progress Notes (Signed)
[facility]    [facility] is providing the following services: RW - patient declined commode   If patient discharges after hours, please call [phone].   Linward Headland 08/29/2013, 7:38 PM

## 2013-08-29 NOTE — Progress Notes (Signed)
Physical Therapy Treatment Patient Details Name: [name] MRN: [id number] DOB: 1931/12/08 Today's Date: 08/29/2013    History of Present Illness R DATHA    PT Comments    Pt progressing very well. Plans DC tomorrow.  Follow Up Recommendations  Home health PT;Supervision/Assistance - 24 hour     Equipment Recommendations  None recommended by PT    Recommendations for Other Services       Precautions / Restrictions Precautions Precautions: Fall    Mobility  Bed Mobility   Bed Mobility: Supine to Sit     Supine to sit: Min assist     General bed mobility comments: cues for technique , assist for R leg  Transfers Overall transfer level: Needs assistance Equipment used: Rolling walker (2 wheeled) Transfers: Sit to/from Stand Sit to Stand: Min guard;From elevated surface         General transfer comment: cues for hand R leg position  Ambulation/Gait Ambulation/Gait assistance: Min guard Ambulation Distance (Feet): 125 Feet Assistive device: Rolling walker (2 wheeled) Gait Pattern/deviations: Step-through pattern     General Gait Details: cues for safety, sequence,   Stairs            Wheelchair Mobility    Modified Rankin (Stroke Patients Only)       Balance                                    Cognition Arousal/Alertness: Awake/alert                          Exercises Total Joint Exercises Ankle Circles/Pumps: AROM;Both;10 reps;Supine Quad Sets: AROM;Both;10 reps;Supine Short Arc Quad: AROM;Right;10 reps;Supine Heel Slides: Right;AAROM;10 reps;Supine Hip ABduction/ADduction: AROM;Right;10 reps;Supine    General Comments        Pertinent Vitals/Pain Pain Score: 2  Pain Location: R thigh Pain Descriptors / Indicators: Sore    Home Living Family/patient expects to be discharged to:: Private residence Living Arrangements: Children Available Help at Discharge: Family;Available 24 hours/day                Prior Function            PT Goals (current goals can now be found in the care plan section) Progress towards PT goals: Progressing toward goals    Frequency  7X/week    PT Plan Current plan remains appropriate    Co-evaluation             End of Session   Activity Tolerance: Patient tolerated treatment well Patient left: in chair;with call bell/phone within reach;with family/visitor present     Time: 2836-6294 PT Time Calculation (min): 35 min  Charges:  $Gait Training: 8-22 mins $Therapeutic Exercise: 8-22 mins                    G Codes:      Claretha Cooper 08/29/2013, 12:37 PM

## 2013-08-29 NOTE — Progress Notes (Signed)
Physical Therapy Treatment Patient Details Name: [name] MRN: [id number] DOB: Dec 03, 1931 Today's Date: 08/29/2013    History of Present Illness R DATHA    PT Comments    Pt progressing very well, not  "limping', barely using RW for weight off set.  Follow Up Recommendations  Home health PT;Supervision/Assistance - 24 hour     Equipment Recommendations  Rolling walker with 5" wheels    Recommendations for Other Services       Precautions / Restrictions Precautions Precautions: Fall    Mobility  Bed Mobility   Bed Mobility: Supine to Sit     Supine to sit: Min assist     General bed mobility comments: cues for technique , assist for R leg  Transfers Overall transfer level: Needs assistance Equipment used: Rolling walker (2 wheeled) Transfers: Sit to/from Stand Sit to Stand: Supervision         General transfer comment: cues for hand R leg position  Ambulation/Gait Ambulation/Gait assistance: Supervision Ambulation Distance (Feet): 400 Feet Assistive device: Rolling walker (2 wheeled) Gait Pattern/deviations: Step-through pattern   Gait velocity interpretation: at or above normal speed for age/gender General Gait Details: cues for safety, sequence,   Stairs            Wheelchair Mobility    Modified Rankin (Stroke Patients Only)       Balance                                    Cognition Arousal/Alertness: Awake/alert                          Exercises Total Joint Exercises Ankle Circles/Pumps: AROM;Both;10 reps;Supine Quad Sets: AROM;Both;10 reps;Supine Short Arc Quad: AROM;Right;10 reps;Supine Heel Slides: Right;AAROM;10 reps;Supine Hip ABduction/ADduction: AROM;Right;10 reps;Supine    General Comments        Pertinent Vitals/Pain Pain Score: 0-No pain Pain Location: R thigh Pain Descriptors / Indicators: Sore    Home Living Family/patient expects to be discharged to:: Private  residence Living Arrangements: Children Available Help at Discharge: Family;Available 24 hours/day                Prior Function            PT Goals (current goals can now be found in the care plan section) Progress towards PT goals: Progressing toward goals    Frequency  7X/week    PT Plan Current plan remains appropriate    Co-evaluation             End of Session   Activity Tolerance: Patient tolerated treatment well Patient left: in chair;with call bell/phone within reach;with family/visitor present     Time: 4270-6237 PT Time Calculation (min): 20 min  Charges:  $Gait Training: 8-22 mins $Therapeutic Exercise: 8-22 mins                    G Codes:      Claretha Cooper 08/29/2013, 3:10 PM

## 2013-08-29 NOTE — Plan of Care (Signed)
Problem: Consults Goal: Diagnosis- Total Joint Replacement Outcome: Completed/Met Date Met:  08/28/13 Primary Total Hip

## 2013-08-29 NOTE — Progress Notes (Signed)
     Subjective: 1 Day Post-Op Procedure(s) (LRB): RIGHT TOTAL HIP ARTHROPLASTY ANTERIOR APPROACH (Right)   Patient reports pain as mild, pain well controlled. Pain is so much better then prior to surgery. Looking forward to progressing with PT.  Objective:   VITALS:   Filed Vitals:   08/29/13 0541  BP: 113/55  Pulse: 55  Temp: 98.6 F (37 C)  Resp: 16    Dorsiflexion/Plantar flexion intact Incision: dressing C/D/I No cellulitis present Compartment soft  LABS  Recent Labs  08/29/13 0450  HGB 10.8*  HCT 32.3*  WBC 7.3  PLT 192     Recent Labs  08/29/13 0450  NA 134*  K 4.9  BUN 24*  CREATININE 1.10  GLUCOSE 138*     Assessment/Plan: 1 Day Post-Op Procedure(s) (LRB): RIGHT TOTAL HIP ARTHROPLASTY ANTERIOR APPROACH (Right) Foley cath d/c'ed Advance diet Up with therapy D/C IV fluids Discharge home with home health eventually, when ready  Expected ABLA  Treated with iron and will observe  Obese (BMI 30-39.9) Estimated body mass index is 32.78 kg/(m^2) as calculated from the following:   Height as of this encounter: 5' 3.5" (1.613 m).   Weight as of this encounter: 85.276 kg (188 lb). Patient also counseled that weight may inhibit the healing process Patient counseled that losing weight will help with future health issues       West Pugh. Nazario Russom   PAC  08/29/2013, 9:53 AM

## 2013-08-29 NOTE — Progress Notes (Signed)
OT Cancellation Note  Patient Details Name: [name] MRN: [id number] DOB: 07/28/1931   Cancelled Treatment:    Reason Eval/Treat Not Completed: Other (comment). Pt has had previous orthopedic surgeries, has 24/7 until next Wednesday, has a high commode and walk in shower with a seat.  She does not have any OT needs/concerns.  Lee-Anne Flicker 08/29/2013, 10:18 AM Lesle Chris, OTR/L [phone] 08/29/2013

## 2013-08-30 LAB — BASIC METABOLIC PANEL
ANION GAP: 10 (ref 5–15)
BUN: 26 mg/dL — ABNORMAL HIGH (ref 6–23)
CALCIUM: 9 mg/dL (ref 8.4–10.5)
CO2: 26 meq/L (ref 19–32)
CREATININE: 0.92 mg/dL (ref 0.50–1.10)
Chloride: 101 mEq/L (ref 96–112)
GFR calc Af Amer: 67 mL/min — ABNORMAL LOW (ref >90)
GFR, EST NON AFRICAN AMERICAN: 58 mL/min — AB (ref >90)
Glucose, Bld: 132 mg/dL — ABNORMAL HIGH (ref 70–99)
Potassium: 4.4 mEq/L (ref 3.7–5.3)
Sodium: 137 mEq/L (ref 137–147)

## 2013-08-30 LAB — CBC
HCT: 29.7 % — ABNORMAL LOW (ref 36.0–46.0)
Hemoglobin: 10 g/dL — ABNORMAL LOW (ref 12.0–15.0)
MCH: 31.3 pg (ref 26.0–34.0)
MCHC: 33.7 g/dL (ref 30.0–36.0)
MCV: 92.8 fL (ref 78.0–100.0)
PLATELETS: 181 10*3/uL (ref 150–400)
RBC: 3.2 MIL/uL — AB (ref 3.87–5.11)
RDW: 13.2 % (ref 11.5–15.5)
WBC: 9 10*3/uL (ref 4.0–10.5)

## 2013-08-30 NOTE — Progress Notes (Signed)
Patient ID: [name], female   DOB: 12/29/1931, 80 y.o.   MRN: [id number] Subjective: 2 Days Post-Op Procedure(s) (LRB): RIGHT TOTAL HIP ARTHROPLASTY ANTERIOR APPROACH (Right)    Patient reports pain as mild. Doing very well.  Ready to go home today and continue to progress   Objective:   VITALS:   Filed Vitals:   08/30/13 0654  BP: 107/47  Pulse: 58  Temp: 97.7 F (36.5 C)  Resp: 14    Neurovascular intact Incision: dressing C/D/I  LABS  Recent Labs  08/29/13 0450 08/30/13 0520  HGB 10.8* 10.0*  HCT 32.3* 29.7*  WBC 7.3 9.0  PLT 192 181     Recent Labs  08/29/13 0450 08/30/13 0520  NA 134* 137  K 4.9 4.4  BUN 24* 26*  CREATININE 1.10 0.92  GLUCOSE 138* 132*    No results found for this basename: LABPT, INR,  in the last 72 hours   Assessment/Plan: 2 Days Post-Op Procedure(s) (LRB): RIGHT TOTAL HIP ARTHROPLASTY ANTERIOR APPROACH (Right)   Up with therapy Discharge home with home health today  RTC in 2 weeks, appointment already set

## 2013-08-31 DIAGNOSIS — Z471 Aftercare following joint replacement surgery: Secondary | ICD-10-CM | POA: Diagnosis not present

## 2013-08-31 DIAGNOSIS — I5030 Unspecified diastolic (congestive) heart failure: Secondary | ICD-10-CM | POA: Diagnosis not present

## 2013-08-31 DIAGNOSIS — I509 Heart failure, unspecified: Secondary | ICD-10-CM | POA: Diagnosis not present

## 2013-08-31 DIAGNOSIS — R269 Unspecified abnormalities of gait and mobility: Secondary | ICD-10-CM | POA: Diagnosis not present

## 2013-08-31 DIAGNOSIS — IMO0001 Reserved for inherently not codable concepts without codable children: Secondary | ICD-10-CM | POA: Diagnosis not present

## 2013-08-31 DIAGNOSIS — I2510 Atherosclerotic heart disease of native coronary artery without angina pectoris: Secondary | ICD-10-CM | POA: Diagnosis not present

## 2013-09-03 NOTE — Discharge Summary (Signed)
Physician Discharge Summary  Patient ID: [name] MRN: [id number] DOB/AGE: June 07, 1931 80 y.o.  Admit date: 08/28/2013 Discharge date: 08/30/2013   Procedures:  Procedure(s) (LRB): RIGHT TOTAL HIP ARTHROPLASTY ANTERIOR APPROACH (Right)  Attending Physician:  Dr. Paralee Cancel   Admission Diagnoses:   Right hip OA / pain  Discharge Diagnoses:  Principal Problem:   S/P right THA, AA Active Problems:   Obese   Expected blood loss anemia  Past Medical History  Diagnosis Date  . Coronary artery disease   . Hypercholesteremia   . Hypothyroidism   . Osteopenia   . PUD (peptic ulcer disease)   . Rheumatic fever     with mild to moderate aortic stenosis and mild AI and grade 2 diastolic dysfunction by echol 08/2011  . Atypical chest pain   . CKD (chronic kidney disease), stage III   . SVT (supraventricular tachycardia) October 2014    converted with vagal maneuver  . Vitamin D deficiency   . Aortic stenosis   . Diastolic dysfunction   . Cancer 2006    SURGERY AND CHEMO AND RADIATION   . Breast cancer, right breast     HPI: [name], 80 y.o. female, has a history of pain and functional disability in the right hip(s) due to arthritis and patient has failed non-surgical conservative treatments for greater than 12 weeks to include NSAID's and/or analgesics, corticosteriod injections and activity modification. Onset of symptoms was gradual starting 1+ years ago with gradually worsening course since that time.The patient noted no past surgery on the right hip(s). Patient currently rates pain in the right hip at 9 out of 10 with activity. Patient has night pain, worsening of pain with activity and weight bearing, trendelenberg gait, pain that interfers with activities of daily living and pain with passive range of motion. Patient has evidence of periarticular osteophytes and joint space narrowing by imaging studies. This condition presents safety issues increasing the risk  of falls. There is no current active infection. Risks, benefits and expectations were discussed with the patient. Risks including but not limited to the risk of anesthesia, blood clots, nerve damage, blood vessel damage, failure of the prosthesis, infection and up to and including death. Patient understand the risks, benefits and expectations and wishes to proceed with surgery.   PCP: Simona Huh, MD   Discharged Condition: good  Hospital Course:  Patient underwent the above stated procedure on 08/28/2013. Patient tolerated the procedure well and brought to the recovery room in good condition and subsequently to the floor.  POD #1 BP: 113/55 ; Pulse: 55 ; Temp: 98.6 F (37 C) ; Resp: 16  Patient reports pain as mild, pain well controlled. Pain is so much better then prior to surgery. Looking forward to progressing with PT. Dorsiflexion/plantar flexion intact, incision: dressing C/D/I, no cellulitis present and compartment soft.   LABS  Basename    HGB  10.8  HCT  32.3   POD #2  BP: 107/47 ; Pulse: 58 ; Temp: 97.7 F (36.5 C) ; Resp: 14 Patient reports pain as mild. Doing very well. Ready to go home today and continue to progress. Dorsiflexion/plantar flexion intact, incision: dressing C/D/I, no cellulitis present and compartment soft.   LABS  Basename    HGB  10.0  HCT  29.7    Discharge Exam: General appearance: alert, cooperative and no distress Extremities: Homans sign is negative, no sign of DVT, no edema, redness or tenderness in the calves or thighs and  no ulcers, gangrene or trophic changes  Disposition: Home with follow up in 2 weeks   Follow-up Information   Follow up with Mauri Pole, MD. Schedule an appointment as soon as possible for a visit in 2 weeks.   Specialty:  Orthopedic Surgery   Contact information:   [address] [place] [address] [phone]       Discharge Instructions   Call MD / Call 911    Complete by:  As  directed   If you experience chest pain or shortness of breath, CALL 911 and be transported to the hospital emergency room.  If you develope a fever above 101 F, pus (white drainage) or increased drainage or redness at the wound, or calf pain, call your surgeon's office.     Change dressing    Complete by:  As directed   Maintain surgical dressing for 10-14 days, or until follow up in the clinic.     Constipation Prevention    Complete by:  As directed   Drink plenty of fluids.  Prune juice may be helpful.  You may use a stool softener, such as Colace (over the counter) 100 mg twice a day.  Use MiraLax (over the counter) for constipation as needed.     Diet - low sodium heart healthy    Complete by:  As directed      Discharge instructions    Complete by:  As directed   Maintain surgical dressing for 10-14 days, or until follow up in the clinic. Follow up in 2 weeks at [facility]. Call with any questions or concerns.     Increase activity slowly as tolerated    Complete by:  As directed      TED hose    Complete by:  As directed   Use stockings (TED hose) for 2 weeks on both leg(s).  You may remove them at night for sleeping.     Weight bearing as tolerated    Complete by:  As directed   Laterality:  right  Extremity:  Lower             Medication List    STOP taking these medications       ibuprofen 200 MG tablet  Commonly known as:  ADVIL,MOTRIN      TAKE these medications       aspirin 325 MG EC tablet  Take 1 tablet (325 mg total) by mouth 2 (two) times daily.     CALCIUM + D PO  Take 1 tablet by mouth daily.     COQ-10 PO  Take 1 tablet by mouth daily.     DSS 100 MG Caps  Take 100 mg by mouth 2 (two) times daily.     ferrous sulfate 325 (65 FE) MG tablet  Take 1 tablet (325 mg total) by mouth 3 (three) times daily after meals.     fish oil-omega-3 fatty acids 1000 MG capsule  Take 1 g by mouth daily.     fluticasone 50 MCG/ACT nasal spray    Commonly known as:  FLONASE  Place 1 spray into both nostrils daily as needed for allergies or rhinitis.     furosemide 20 MG tablet  Commonly known as:  LASIX  Take 20 mg by mouth daily as needed for fluid.     HYDROcodone-acetaminophen 7.5-325 MG per tablet  Commonly known as:  NORCO  Take 1-2 tablets by mouth every 4 (four) hours as needed for moderate  pain.     lansoprazole 30 MG capsule  Commonly known as:  PREVACID  Take 30 mg by mouth daily.     levothyroxine 112 MCG tablet  Commonly known as:  SYNTHROID, LEVOTHROID  Take 112 mcg by mouth daily before breakfast.     magnesium gluconate 500 MG tablet  Commonly known as:  MAGONATE  Take 500 mg by mouth 2 (two) times daily.     multivitamin with minerals Tabs tablet  Take 1 tablet by mouth daily.     polyethylene glycol packet  Commonly known as:  MIRALAX / GLYCOLAX  Take 17 g by mouth 2 (two) times daily.     potassium chloride 10 MEQ tablet  Commonly known as:  K-DUR  Take 10 mEq by mouth daily as needed (Takes with lasix if needed for swelling).     PROBIOTIC DAILY PO  Take 1 tablet by mouth daily.     tiZANidine 4 MG tablet  Commonly known as:  ZANAFLEX  Take 1 tablet (4 mg total) by mouth every 6 (six) hours as needed.     VITAMIN B-12 PO  Take 1 tablet by mouth daily.     Vitamin D (Ergocalciferol) 50000 UNITS Caps capsule  Commonly known as:  DRISDOL  Take 50,000 Units by mouth every 7 (seven) days. Saturday         Signed: West Pugh. Yoltzin Barg   PA-C  09/03/2013, 9:56 AM

## 2013-09-04 DIAGNOSIS — Z471 Aftercare following joint replacement surgery: Secondary | ICD-10-CM | POA: Diagnosis not present

## 2013-09-04 DIAGNOSIS — R269 Unspecified abnormalities of gait and mobility: Secondary | ICD-10-CM | POA: Diagnosis not present

## 2013-09-04 DIAGNOSIS — I5030 Unspecified diastolic (congestive) heart failure: Secondary | ICD-10-CM | POA: Diagnosis not present

## 2013-09-04 DIAGNOSIS — I509 Heart failure, unspecified: Secondary | ICD-10-CM | POA: Diagnosis not present

## 2013-09-04 DIAGNOSIS — IMO0001 Reserved for inherently not codable concepts without codable children: Secondary | ICD-10-CM | POA: Diagnosis not present

## 2013-09-04 DIAGNOSIS — I2510 Atherosclerotic heart disease of native coronary artery without angina pectoris: Secondary | ICD-10-CM | POA: Diagnosis not present

## 2013-09-05 DIAGNOSIS — I2510 Atherosclerotic heart disease of native coronary artery without angina pectoris: Secondary | ICD-10-CM | POA: Diagnosis not present

## 2013-09-05 DIAGNOSIS — I509 Heart failure, unspecified: Secondary | ICD-10-CM | POA: Diagnosis not present

## 2013-09-05 DIAGNOSIS — I5030 Unspecified diastolic (congestive) heart failure: Secondary | ICD-10-CM | POA: Diagnosis not present

## 2013-09-05 DIAGNOSIS — Z471 Aftercare following joint replacement surgery: Secondary | ICD-10-CM | POA: Diagnosis not present

## 2013-09-05 DIAGNOSIS — R269 Unspecified abnormalities of gait and mobility: Secondary | ICD-10-CM | POA: Diagnosis not present

## 2013-09-05 DIAGNOSIS — IMO0001 Reserved for inherently not codable concepts without codable children: Secondary | ICD-10-CM | POA: Diagnosis not present

## 2013-09-07 ENCOUNTER — Ambulatory Visit: Payer: Medicare Other | Admitting: Cardiology

## 2013-09-07 DIAGNOSIS — I2510 Atherosclerotic heart disease of native coronary artery without angina pectoris: Secondary | ICD-10-CM | POA: Diagnosis not present

## 2013-09-07 DIAGNOSIS — IMO0001 Reserved for inherently not codable concepts without codable children: Secondary | ICD-10-CM | POA: Diagnosis not present

## 2013-09-07 DIAGNOSIS — I5030 Unspecified diastolic (congestive) heart failure: Secondary | ICD-10-CM | POA: Diagnosis not present

## 2013-09-07 DIAGNOSIS — R269 Unspecified abnormalities of gait and mobility: Secondary | ICD-10-CM | POA: Diagnosis not present

## 2013-09-07 DIAGNOSIS — Z471 Aftercare following joint replacement surgery: Secondary | ICD-10-CM | POA: Diagnosis not present

## 2013-09-07 DIAGNOSIS — I509 Heart failure, unspecified: Secondary | ICD-10-CM | POA: Diagnosis not present

## 2013-09-11 DIAGNOSIS — I5030 Unspecified diastolic (congestive) heart failure: Secondary | ICD-10-CM | POA: Diagnosis not present

## 2013-09-11 DIAGNOSIS — IMO0001 Reserved for inherently not codable concepts without codable children: Secondary | ICD-10-CM | POA: Diagnosis not present

## 2013-09-11 DIAGNOSIS — R269 Unspecified abnormalities of gait and mobility: Secondary | ICD-10-CM | POA: Diagnosis not present

## 2013-09-11 DIAGNOSIS — Z471 Aftercare following joint replacement surgery: Secondary | ICD-10-CM | POA: Diagnosis not present

## 2013-09-11 DIAGNOSIS — I509 Heart failure, unspecified: Secondary | ICD-10-CM | POA: Diagnosis not present

## 2013-09-11 DIAGNOSIS — I2510 Atherosclerotic heart disease of native coronary artery without angina pectoris: Secondary | ICD-10-CM | POA: Diagnosis not present

## 2013-09-13 DIAGNOSIS — R269 Unspecified abnormalities of gait and mobility: Secondary | ICD-10-CM | POA: Diagnosis not present

## 2013-09-13 DIAGNOSIS — Z471 Aftercare following joint replacement surgery: Secondary | ICD-10-CM | POA: Diagnosis not present

## 2013-09-13 DIAGNOSIS — I5030 Unspecified diastolic (congestive) heart failure: Secondary | ICD-10-CM | POA: Diagnosis not present

## 2013-09-13 DIAGNOSIS — I2510 Atherosclerotic heart disease of native coronary artery without angina pectoris: Secondary | ICD-10-CM | POA: Diagnosis not present

## 2013-09-13 DIAGNOSIS — IMO0001 Reserved for inherently not codable concepts without codable children: Secondary | ICD-10-CM | POA: Diagnosis not present

## 2013-09-13 DIAGNOSIS — I509 Heart failure, unspecified: Secondary | ICD-10-CM | POA: Diagnosis not present

## 2013-09-15 DIAGNOSIS — IMO0001 Reserved for inherently not codable concepts without codable children: Secondary | ICD-10-CM | POA: Diagnosis not present

## 2013-09-15 DIAGNOSIS — Z471 Aftercare following joint replacement surgery: Secondary | ICD-10-CM | POA: Diagnosis not present

## 2013-09-15 DIAGNOSIS — R269 Unspecified abnormalities of gait and mobility: Secondary | ICD-10-CM | POA: Diagnosis not present

## 2013-09-15 DIAGNOSIS — I2510 Atherosclerotic heart disease of native coronary artery without angina pectoris: Secondary | ICD-10-CM | POA: Diagnosis not present

## 2013-09-15 DIAGNOSIS — I509 Heart failure, unspecified: Secondary | ICD-10-CM | POA: Diagnosis not present

## 2013-09-15 DIAGNOSIS — I5030 Unspecified diastolic (congestive) heart failure: Secondary | ICD-10-CM | POA: Diagnosis not present

## 2013-10-10 DIAGNOSIS — Z96641 Presence of right artificial hip joint: Secondary | ICD-10-CM | POA: Diagnosis not present

## 2013-10-10 DIAGNOSIS — M1712 Unilateral primary osteoarthritis, left knee: Secondary | ICD-10-CM | POA: Diagnosis not present

## 2013-10-10 DIAGNOSIS — M25562 Pain in left knee: Secondary | ICD-10-CM | POA: Diagnosis not present

## 2013-10-10 DIAGNOSIS — Z4789 Encounter for other orthopedic aftercare: Secondary | ICD-10-CM | POA: Diagnosis not present

## 2013-10-17 DIAGNOSIS — Z23 Encounter for immunization: Secondary | ICD-10-CM | POA: Diagnosis not present

## 2013-10-22 DIAGNOSIS — H0012 Chalazion right lower eyelid: Secondary | ICD-10-CM | POA: Diagnosis not present

## 2013-11-05 ENCOUNTER — Ambulatory Visit (INDEPENDENT_AMBULATORY_CARE_PROVIDER_SITE_OTHER): Payer: Medicare Other | Admitting: Cardiology

## 2013-11-05 ENCOUNTER — Encounter: Payer: Self-pay | Admitting: Cardiology

## 2013-11-05 VITALS — BP 118/62 | HR 64 | Wt 182.8 lb

## 2013-11-05 DIAGNOSIS — I350 Nonrheumatic aortic (valve) stenosis: Secondary | ICD-10-CM

## 2013-11-05 DIAGNOSIS — I471 Supraventricular tachycardia: Secondary | ICD-10-CM | POA: Diagnosis not present

## 2013-11-05 DIAGNOSIS — I5032 Chronic diastolic (congestive) heart failure: Secondary | ICD-10-CM

## 2013-11-05 NOTE — Progress Notes (Signed)
[address], [place], [place]  [address] Phone: [phone] Fax:  [phone]  Date:  11/05/2013   ID:  [name], DOB May 13, 1931, MRN [id number]  PCP:  Simona Huh, MD  Cardiologist:  Fransico Him, MD    History of Present Illness: [name] is a 80 y.o. female with a history of rheumatic fever as a child, Mild to moderate AS with mild AI, SVT, diastolic dysfunction with chronic diastolic CHF who presents today for followup. She is doing well. She denies any chest pain.   She has chronic DOE which is stable. She denies any LE edema, dizziness or syncope.  Recently she noticed some fluttering of her chest and eventually resolved with coughing.      Wt Readings from Last 3 Encounters:  11/05/13 182 lb 12.8 oz (82.918 kg)  08/28/13 188 lb (85.276 kg)  08/21/13 188 lb (85.276 kg)     Past Medical History  Diagnosis Date  . Coronary artery disease   . Hypercholesteremia   . Hypothyroidism   . Osteopenia   . PUD (peptic ulcer disease)   . Rheumatic fever     with moderate aortic stenosis and mild AI and grade 2 diastolic dysfunction by echol 08/2011  . Atypical chest pain   . CKD (chronic kidney disease), stage III   . SVT (supraventricular tachycardia) October 2014    converted with vagal maneuver  . Vitamin D deficiency   . Aortic stenosis   . Diastolic dysfunction   . Cancer 2006    SURGERY AND CHEMO AND RADIATION   . Breast cancer, right breast     Current Outpatient Prescriptions  Medication Sig Dispense Refill  . aspirin EC 81 MG tablet Take 81 mg by mouth daily.    . Calcium Carbonate-Vitamin D (CALCIUM + D PO) Take 1 tablet by mouth daily.    . Coenzyme Q10 (COQ-10 PO) Take 1 tablet by mouth daily.     . Cyanocobalamin (VITAMIN B-12 PO) Take 1 tablet by mouth daily.    Marland Kitchen docusate sodium 100 MG CAPS Take 100 mg by mouth 2 (two) times daily. 10 capsule 0  . ferrous sulfate 325 (65 FE) MG tablet Take 1 tablet (325 mg total) by mouth  3 (three) times daily after meals.  3  . fish oil-omega-3 fatty acids 1000 MG capsule Take 1 g by mouth daily.    . fluticasone (FLONASE) 50 MCG/ACT nasal spray Place 1 spray into both nostrils daily as needed for allergies or rhinitis.    . furosemide (LASIX) 20 MG tablet Take 20 mg by mouth daily as needed for fluid.     Marland Kitchen ibuprofen (ADVIL,MOTRIN) 200 MG tablet Take 200 mg by mouth every 6 (six) hours as needed for moderate pain.    Marland Kitchen lansoprazole (PREVACID) 30 MG capsule Take 30 mg by mouth daily.    Marland Kitchen levothyroxine (SYNTHROID, LEVOTHROID) 112 MCG tablet Take 112 mcg by mouth daily before breakfast.    . magnesium gluconate (MAGONATE) 500 MG tablet Take 500 mg by mouth 2 (two) times daily.     . Multiple Vitamin (MULTIVITAMIN WITH MINERALS) TABS tablet Take 1 tablet by mouth daily.    . polyethylene glycol (MIRALAX / GLYCOLAX) packet Take 17 g by mouth 2 (two) times daily. 14 each 0  . potassium chloride (K-DUR) 10 MEQ tablet Take 10 mEq by mouth daily as needed (Takes with lasix if needed for swelling).     . Probiotic  Product (PROBIOTIC DAILY PO) Take 1 tablet by mouth daily.     Marland Kitchen tiZANidine (ZANAFLEX) 4 MG tablet Take 1 tablet (4 mg total) by mouth every 6 (six) hours as needed. 30 tablet 0  . Vitamin D, Ergocalciferol, (DRISDOL) 50000 UNITS CAPS capsule Take 50,000 Units by mouth every 7 (seven) days. Saturday    . [DISCONTINUED] metoprolol succinate (TOPROL-XL) 25 MG 24 hr tablet Take 1 tablet (25 mg total) by mouth daily. 30 tablet 0   No current facility-administered medications for this visit.    Allergies:    Allergies  Allergen Reactions  . Atorvastatin     Myalgias 10/2011  . Compazine [Prochlorperazine]     unknown  . Fenofibrate     Rash 09/2011  . Floxin [Ofloxacin]     unknown  . Livalo [Pitavastatin] Other (See Comments)    Leg pain  . Pravastatin Sodium     mylgias 09/2011  . Simvastatin     Elevated lft's 06/2011    Social History:  The patient  reports that  she has never smoked. She has never used smokeless tobacco. She reports that she does not drink alcohol or use illicit drugs.   Family History:  The patient's family history includes Heart disease in her father and mother.   ROS:  Please see the history of present illness.      All other systems reviewed and negative.   PHYSICAL EXAM: VS:  BP 118/62 mmHg  Pulse 64  Wt 182 lb 12.8 oz (82.918 kg) Well nourished, well developed, in no acute distress HEENT: normal Neck: no JVD Cardiac:  normal S1, S2; RRR; 2/6 SM at RUSB to LLSB Lungs:  clear to auscultation bilaterally, no wheezing, rhonchi or rales Abd: soft, nontender, no hepatomegaly Ext: no edema Skin: warm and dry Neuro:  CNs 2-12 intact, no focal abnormalities noted     ASSESSMENT AND PLAN:  1. SVT with no reoccurence - continue metoprolol PRN for breakthrough 2. Chronic diastolic CHF appears compensated with chronic DOE - continue Lasix PRN 3. Moderate AS - will repeat echo in June 2015  Followup with me in 6 months   Signed, Fransico Him, MD [facility] 11/05/2013 8:29 AM

## 2013-11-05 NOTE — Patient Instructions (Signed)
Your physician wants you to follow-up in: 6 months with Dr. Turner. You will receive a reminder letter in the mail two months in advance. If you don't receive a letter, please call our office to schedule the follow-up appointment.  Your physician recommends that you continue on your current medications as directed. Please refer to the Current Medication list given to you today.  

## 2013-11-15 DIAGNOSIS — H0012 Chalazion right lower eyelid: Secondary | ICD-10-CM | POA: Diagnosis not present

## 2014-02-05 DIAGNOSIS — J309 Allergic rhinitis, unspecified: Secondary | ICD-10-CM | POA: Diagnosis not present

## 2014-02-05 DIAGNOSIS — E559 Vitamin D deficiency, unspecified: Secondary | ICD-10-CM | POA: Diagnosis not present

## 2014-02-05 DIAGNOSIS — Z23 Encounter for immunization: Secondary | ICD-10-CM | POA: Diagnosis not present

## 2014-02-05 DIAGNOSIS — N183 Chronic kidney disease, stage 3 (moderate): Secondary | ICD-10-CM | POA: Diagnosis not present

## 2014-02-05 DIAGNOSIS — M899 Disorder of bone, unspecified: Secondary | ICD-10-CM | POA: Diagnosis not present

## 2014-02-05 DIAGNOSIS — E780 Pure hypercholesterolemia: Secondary | ICD-10-CM | POA: Diagnosis not present

## 2014-02-05 DIAGNOSIS — E039 Hypothyroidism, unspecified: Secondary | ICD-10-CM | POA: Diagnosis not present

## 2014-03-12 DIAGNOSIS — J069 Acute upper respiratory infection, unspecified: Secondary | ICD-10-CM | POA: Diagnosis not present

## 2014-04-03 ENCOUNTER — Other Ambulatory Visit: Payer: Self-pay | Admitting: *Deleted

## 2014-04-03 MED ORDER — FUROSEMIDE 20 MG PO TABS: 20.0000 mg | ORAL_TABLET | Freq: Every day | ORAL | Status: DC | PRN

## 2014-04-03 MED ORDER — POTASSIUM CHLORIDE ER 10 MEQ PO TBCR: 10.0000 meq | EXTENDED_RELEASE_TABLET | Freq: Every day | ORAL | Status: DC | PRN

## 2014-04-03 NOTE — Telephone Encounter (Signed)
Ok to refill these? I don't see where Dr Radford Pax has ever refilled them. Please advise. Thanks, MI

## 2014-04-03 NOTE — Telephone Encounter (Signed)
Ok to refill 

## 2014-04-08 DIAGNOSIS — Z01419 Encounter for gynecological examination (general) (routine) without abnormal findings: Secondary | ICD-10-CM | POA: Diagnosis not present

## 2014-04-08 DIAGNOSIS — Z6835 Body mass index (BMI) 35.0-35.9, adult: Secondary | ICD-10-CM | POA: Diagnosis not present

## 2014-04-25 DIAGNOSIS — H40033 Anatomical narrow angle, bilateral: Secondary | ICD-10-CM | POA: Diagnosis not present

## 2014-04-25 DIAGNOSIS — H25013 Cortical age-related cataract, bilateral: Secondary | ICD-10-CM | POA: Diagnosis not present

## 2014-04-25 DIAGNOSIS — H2513 Age-related nuclear cataract, bilateral: Secondary | ICD-10-CM | POA: Diagnosis not present

## 2014-05-13 ENCOUNTER — Encounter: Payer: Self-pay | Admitting: Cardiology

## 2014-05-13 NOTE — Progress Notes (Signed)
Cardiology Office Note   Date:  05/14/2014   ID:  [name], DOB 12/25/1931, MRN [id number]  PCP:  Simona Huh, MD    Chief Complaint  Patient presents with  . SVT  . Congestive Heart Failure  . Aortic Stenosis      History of Present Illness: [name] is a 81 y.o. female with a history of rheumatic fever as a child, moderate AS with mild AI, SVT, diastolic dysfunction with chronic diastolic CHF who presents today for followup. She is doing well. She denies any chest pain. She has chronic DOE which has gotten worse which she thinks is from allergies.She started taking her Lasix daily and her breathing is better.  She denies any LE edema, dizziness or syncope.      Past Medical History  Diagnosis Date  . Coronary artery disease   . Hypercholesteremia   . Hypothyroidism   . Osteopenia   . PUD (peptic ulcer disease)   . Rheumatic fever   . Atypical chest pain   . CKD (chronic kidney disease), stage III   . SVT (supraventricular tachycardia) October 2014    converted with vagal maneuver  . Vitamin D deficiency   . Aortic stenosis     moderate by echo 06/2013  . Diastolic dysfunction   . Cancer 2006    SURGERY AND CHEMO AND RADIATION   . Breast cancer, right breast     Past Surgical History  Procedure Laterality Date  . Thyroid surgery      NODULE REMOVED  . Total hip arthroplasty Left 2003  . Replacement total knee Right 2009  . Breast surgery      RIGHT LUMPECTOMY AND REMOVAL OF LYMPH NODES  . Total hip arthroplasty Right 08/28/2013    Procedure: RIGHT TOTAL HIP ARTHROPLASTY ANTERIOR APPROACH;  Surgeon: Mauri Pole, MD;  Location: [facility];  Service: Orthopedics;  Laterality: Right;     Current Outpatient Prescriptions  Medication Sig Dispense Refill  . aspirin EC 81 MG tablet Take 81 mg by mouth daily.    . Calcium Carbonate-Vitamin D (CALCIUM + D PO) Take 1 tablet by mouth daily.    . Coenzyme Q10 (COQ-10 PO) Take 1 tablet by  mouth daily.     . Cyanocobalamin (VITAMIN B-12 PO) Take 1 tablet by mouth daily.    Marland Kitchen docusate sodium 100 MG CAPS Take 100 mg by mouth 2 (two) times daily. 10 capsule 0  . ferrous sulfate 325 (65 FE) MG tablet Take 1 tablet (325 mg total) by mouth 3 (three) times daily after meals.  3  . fish oil-omega-3 fatty acids 1000 MG capsule Take 1 g by mouth daily.    . fluticasone (FLONASE) 50 MCG/ACT nasal spray Place 1 spray into both nostrils daily as needed for allergies or rhinitis.    . furosemide (LASIX) 20 MG tablet Take 1 tablet (20 mg total) by mouth daily as needed for fluid. 30 tablet 1  . ibuprofen (ADVIL,MOTRIN) 200 MG tablet Take 200 mg by mouth every 6 (six) hours as needed for moderate pain.    Marland Kitchen lansoprazole (PREVACID) 30 MG capsule Take 30 mg by mouth daily.    Marland Kitchen levothyroxine (SYNTHROID, LEVOTHROID) 112 MCG tablet Take 112 mcg by mouth daily before breakfast.    . magnesium gluconate (MAGONATE) 500 MG tablet Take 500 mg by mouth 2 (two) times daily.     . Multiple Vitamin (MULTIVITAMIN WITH MINERALS) TABS tablet Take 1 tablet by  mouth daily.    . potassium chloride (K-DUR) 10 MEQ tablet Take 1 tablet (10 mEq total) by mouth daily as needed (Takes with lasix if needed for swelling). 30 tablet 1  . Probiotic Product (PROBIOTIC DAILY PO) Take 1 tablet by mouth daily.     . Vitamin D, Ergocalciferol, (DRISDOL) 50000 UNITS CAPS capsule Take 50,000 Units by mouth every 7 (seven) days. Saturday    . [DISCONTINUED] metoprolol succinate (TOPROL-XL) 25 MG 24 hr tablet Take 1 tablet (25 mg total) by mouth daily. 30 tablet 0   No current facility-administered medications for this visit.    Allergies:   Atorvastatin; Compazine; Fenofibrate; Floxin; Livalo; Pravastatin sodium; and Simvastatin    Social History:  The patient  reports that she has never smoked. She has never used smokeless tobacco. She reports that she does not drink alcohol or use illicit drugs.   Family History:  The  patient's family history includes Heart disease in her father and mother.    ROS:  Please see the history of present illness.   Otherwise, review of systems are positive for none.   All other systems are reviewed and negative.    PHYSICAL EXAM: VS:  BP 120/70 mmHg  Pulse 65  Ht 5' 3.5" (1.613 m)  Wt 186 lb (84.369 kg)  BMI 32.43 kg/m2 , BMI Body mass index is 32.43 kg/(m^2). GEN: Well nourished, well developed, in no acute distress HEENT: normal Neck: no JVD, carotid bruits, or masses Cardiac: RRR; no rubs, or gallops,no edema 2/6 harsh systolic murmur at RUSB to LLSB Respiratory:  clear to auscultation bilaterally, normal work of breathing GI: soft, nontender, nondistended, + BS MS: no deformity or atrophy Skin: warm and dry, no rash Neuro:  Strength and sensation are intact Psych: euthymic mood, full affect   EKG:  EKG was ordered today and showed NSR at 65bpm with IRBBB and nonspecific ST abnormality    Recent Labs: 08/30/2013: BUN 26*; Creatinine 0.92; Hemoglobin 10.0*; Platelets 181; Potassium 4.4; Sodium 137    Lipid Panel No results found for: CHOL, TRIG, HDL, CHOLHDL, VLDL, LDLCALC, LDLDIRECT    Wt Readings from Last 3 Encounters:  05/14/14 186 lb (84.369 kg)  11/05/13 182 lb 12.8 oz (82.918 kg)  08/28/13 188 lb (85.276 kg)     ASSESSMENT AND PLAN:  1. SVT with no reoccurence - continue metoprolol PRN for breakthrough 2. Chronic diastolic CHF appears compensated with chronic DOE - continue Lasix daily 3. Moderate AS - will repeat echo in June 2016 4. SOB that is chronic but slightly worse and she thinks it is from allergies but has improved some with Lasix.  I will check a BNP/BMET   Current medicines are reviewed at length with the patient today.  The patient does not have concerns regarding medicines.  The following changes have been made:  no change  Labs/ tests ordered today include: see above assessment and plan No orders  of the defined types were placed in this encounter.     Disposition:   FU with me in 6 months   Signed, Sueanne Margarita, MD  05/14/2014 8:09 AM    [facility] [address] [place], [place], [place]  [address] Phone: [phone]; Fax: [phone]

## 2014-05-14 ENCOUNTER — Encounter: Payer: Self-pay | Admitting: Cardiology

## 2014-05-14 ENCOUNTER — Ambulatory Visit (INDEPENDENT_AMBULATORY_CARE_PROVIDER_SITE_OTHER): Payer: Medicare Other | Admitting: Cardiology

## 2014-05-14 VITALS — BP 120/70 | HR 65 | Ht 63.5 in | Wt 186.0 lb

## 2014-05-14 DIAGNOSIS — I5032 Chronic diastolic (congestive) heart failure: Secondary | ICD-10-CM

## 2014-05-14 DIAGNOSIS — I471 Supraventricular tachycardia: Secondary | ICD-10-CM | POA: Diagnosis not present

## 2014-05-14 DIAGNOSIS — I350 Nonrheumatic aortic (valve) stenosis: Secondary | ICD-10-CM

## 2014-05-14 LAB — BASIC METABOLIC PANEL
BUN: 25 mg/dL — ABNORMAL HIGH (ref 6–23)
CALCIUM: 9.9 mg/dL (ref 8.4–10.5)
CHLORIDE: 99 meq/L (ref 96–112)
CO2: 32 meq/L (ref 19–32)
Creatinine, Ser: 0.92 mg/dL (ref 0.40–1.20)
GFR: 62.35 mL/min (ref >60.00)
GLUCOSE: 87 mg/dL (ref 70–99)
Potassium: 4.4 mEq/L (ref 3.5–5.1)
Sodium: 137 mEq/L (ref 135–145)

## 2014-05-14 LAB — BRAIN NATRIURETIC PEPTIDE: Pro B Natriuretic peptide (BNP): 90 pg/mL (ref 0.0–100.0)

## 2014-05-14 NOTE — Patient Instructions (Addendum)
Medication Instructions:  Your physician recommends that you continue on your current medications as directed. Please refer to the Current Medication list given to you today.   Labwork: TODAY: BMET, BNP  Testing/Procedures: Your physician has requested that you have an echocardiogram in June 2016. Echocardiography is a painless test that uses sound waves to create images of your heart. It provides your doctor with information about the size and shape of your heart and how well your heart's chambers and valves are working. This procedure takes approximately one hour. There are no restrictions for this procedure.  Follow-Up: Your physician wants you to follow-up in: 6 months with Dr. Radford Pax. You will receive a reminder letter in the mail two months in advance. If you don't receive a letter, please call our office to schedule the follow-up appointment.   Any Other Special Instructions Will Be Listed Below (If Applicable).

## 2014-05-29 ENCOUNTER — Other Ambulatory Visit: Payer: Self-pay

## 2014-05-29 DIAGNOSIS — Z1231 Encounter for screening mammogram for malignant neoplasm of breast: Secondary | ICD-10-CM

## 2014-05-30 ENCOUNTER — Other Ambulatory Visit: Payer: Self-pay | Admitting: Cardiology

## 2014-06-11 ENCOUNTER — Other Ambulatory Visit (HOSPITAL_COMMUNITY): Payer: Medicare Other

## 2014-06-12 ENCOUNTER — Telehealth: Payer: Self-pay | Admitting: Cardiology

## 2014-06-12 ENCOUNTER — Other Ambulatory Visit: Payer: Self-pay

## 2014-06-12 ENCOUNTER — Ambulatory Visit (HOSPITAL_COMMUNITY): Payer: Medicare Other | Attending: Cardiology

## 2014-06-12 DIAGNOSIS — I350 Nonrheumatic aortic (valve) stenosis: Secondary | ICD-10-CM | POA: Diagnosis not present

## 2014-06-12 DIAGNOSIS — I5032 Chronic diastolic (congestive) heart failure: Secondary | ICD-10-CM

## 2014-06-12 NOTE — Telephone Encounter (Signed)
Follow Up ° ° ° ° ° °Pt returning Katy's phone call. °

## 2014-06-12 NOTE — Telephone Encounter (Signed)
-----   Message from Sueanne Margarita, MD sent at 06/12/2014 12:13 PM EDT ----- Echo showed normal LVF with mildly thickened heart muscle, G2DD, mild AI moderate AS, mildly leaky MV, no change from prior study - repeat echo in 1 year

## 2014-06-12 NOTE — Telephone Encounter (Signed)
Informed patient of results and verbal understanding expressed.  

## 2014-06-27 ENCOUNTER — Ambulatory Visit
Admission: RE | Admit: 2014-06-27 | Discharge: 2014-06-27 | Disposition: A | Discharge status: Home / Self Care | Payer: Medicare Other | Source: Ambulatory Visit

## 2014-06-27 DIAGNOSIS — Z1231 Encounter for screening mammogram for malignant neoplasm of breast: Secondary | ICD-10-CM

## 2014-07-12 DIAGNOSIS — R109 Unspecified abdominal pain: Secondary | ICD-10-CM | POA: Diagnosis not present

## 2014-07-12 DIAGNOSIS — R1110 Vomiting, unspecified: Secondary | ICD-10-CM | POA: Diagnosis not present

## 2014-07-31 ENCOUNTER — Telehealth: Payer: Self-pay | Admitting: Cardiology

## 2014-07-31 NOTE — Telephone Encounter (Signed)
Patient st she felt "fainty," clammy and nauseated from about 1030 today until 1500. She called Dr. Marisue Humble because she has been having gall bladder issues. Dr. Andrew Au office told her to contact Dr. Radford Pax, because her ABD pain is resolved and nothing she ate recently would exacerbate gallbladder issues. Patient has no VS to report but says during the episode she had no palpitations and her heart was not racing.  To Dr. Radford Pax for review.

## 2014-07-31 NOTE — Telephone Encounter (Signed)
New message     Pt states she had episode this morning where she felt faint, clammy and had nausea. Pt state she also felt lightheaded this morning Pt still having a little nausea but no other symptoms at this moment Please call to discuss

## 2014-08-03 NOTE — Telephone Encounter (Signed)
Was this addressed by DOD?

## 2014-08-05 ENCOUNTER — Emergency Department (HOSPITAL_COMMUNITY): Payer: Medicare Other

## 2014-08-05 ENCOUNTER — Inpatient Hospital Stay (HOSPITAL_COMMUNITY)
Admission: EM | Admit: 2014-08-05 | Discharge: 2014-08-07 | DRG: 418 | Disposition: A | Discharge status: Home / Self Care | Payer: Medicare Other | Attending: Internal Medicine | Admitting: Internal Medicine

## 2014-08-05 ENCOUNTER — Encounter (HOSPITAL_COMMUNITY): Payer: Self-pay | Admitting: Emergency Medicine

## 2014-08-05 ENCOUNTER — Inpatient Hospital Stay (HOSPITAL_COMMUNITY): Payer: Medicare Other

## 2014-08-05 DIAGNOSIS — D175 Benign lipomatous neoplasm of intra-abdominal organs: Secondary | ICD-10-CM | POA: Diagnosis not present

## 2014-08-05 DIAGNOSIS — I350 Nonrheumatic aortic (valve) stenosis: Secondary | ICD-10-CM | POA: Diagnosis not present

## 2014-08-05 DIAGNOSIS — E559 Vitamin D deficiency, unspecified: Secondary | ICD-10-CM | POA: Diagnosis present

## 2014-08-05 DIAGNOSIS — E785 Hyperlipidemia, unspecified: Secondary | ICD-10-CM | POA: Diagnosis present

## 2014-08-05 DIAGNOSIS — K812 Acute cholecystitis with chronic cholecystitis: Secondary | ICD-10-CM | POA: Diagnosis not present

## 2014-08-05 DIAGNOSIS — C50911 Malignant neoplasm of unspecified site of right female breast: Secondary | ICD-10-CM | POA: Diagnosis present

## 2014-08-05 DIAGNOSIS — R079 Chest pain, unspecified: Secondary | ICD-10-CM | POA: Diagnosis not present

## 2014-08-05 DIAGNOSIS — E039 Hypothyroidism, unspecified: Secondary | ICD-10-CM | POA: Diagnosis present

## 2014-08-05 DIAGNOSIS — E7800 Pure hypercholesterolemia, unspecified: Secondary | ICD-10-CM | POA: Diagnosis present

## 2014-08-05 DIAGNOSIS — Z9221 Personal history of antineoplastic chemotherapy: Secondary | ICD-10-CM | POA: Diagnosis not present

## 2014-08-05 DIAGNOSIS — Z01810 Encounter for preprocedural cardiovascular examination: Secondary | ICD-10-CM

## 2014-08-05 DIAGNOSIS — I2510 Atherosclerotic heart disease of native coronary artery without angina pectoris: Secondary | ICD-10-CM | POA: Diagnosis present

## 2014-08-05 DIAGNOSIS — I950 Idiopathic hypotension: Secondary | ICD-10-CM

## 2014-08-05 DIAGNOSIS — K819 Cholecystitis, unspecified: Secondary | ICD-10-CM | POA: Diagnosis not present

## 2014-08-05 DIAGNOSIS — Z923 Personal history of irradiation: Secondary | ICD-10-CM

## 2014-08-05 DIAGNOSIS — I5032 Chronic diastolic (congestive) heart failure: Secondary | ICD-10-CM | POA: Diagnosis present

## 2014-08-05 DIAGNOSIS — K8012 Calculus of gallbladder with acute and chronic cholecystitis without obstruction: Secondary | ICD-10-CM | POA: Diagnosis not present

## 2014-08-05 DIAGNOSIS — I4891 Unspecified atrial fibrillation: Secondary | ICD-10-CM | POA: Diagnosis present

## 2014-08-05 DIAGNOSIS — K279 Peptic ulcer, site unspecified, unspecified as acute or chronic, without hemorrhage or perforation: Secondary | ICD-10-CM | POA: Diagnosis present

## 2014-08-05 DIAGNOSIS — K7581 Nonalcoholic steatohepatitis (NASH): Secondary | ICD-10-CM | POA: Diagnosis present

## 2014-08-05 DIAGNOSIS — K8020 Calculus of gallbladder without cholecystitis without obstruction: Secondary | ICD-10-CM | POA: Diagnosis not present

## 2014-08-05 DIAGNOSIS — R1011 Right upper quadrant pain: Secondary | ICD-10-CM | POA: Diagnosis not present

## 2014-08-05 DIAGNOSIS — I959 Hypotension, unspecified: Secondary | ICD-10-CM | POA: Diagnosis present

## 2014-08-05 DIAGNOSIS — R933 Abnormal findings on diagnostic imaging of other parts of digestive tract: Secondary | ICD-10-CM | POA: Diagnosis not present

## 2014-08-05 DIAGNOSIS — R748 Abnormal levels of other serum enzymes: Secondary | ICD-10-CM | POA: Diagnosis not present

## 2014-08-05 DIAGNOSIS — R740 Nonspecific elevation of levels of transaminase and lactic acid dehydrogenase [LDH]: Secondary | ICD-10-CM | POA: Diagnosis not present

## 2014-08-05 DIAGNOSIS — Z79899 Other long term (current) drug therapy: Secondary | ICD-10-CM

## 2014-08-05 DIAGNOSIS — R05 Cough: Secondary | ICD-10-CM | POA: Diagnosis not present

## 2014-08-05 DIAGNOSIS — N183 Chronic kidney disease, stage 3 (moderate): Secondary | ICD-10-CM | POA: Diagnosis present

## 2014-08-05 DIAGNOSIS — K810 Acute cholecystitis: Secondary | ICD-10-CM

## 2014-08-05 DIAGNOSIS — Z96643 Presence of artificial hip joint, bilateral: Secondary | ICD-10-CM | POA: Diagnosis present

## 2014-08-05 DIAGNOSIS — Z888 Allergy status to other drugs, medicaments and biological substances status: Secondary | ICD-10-CM

## 2014-08-05 DIAGNOSIS — K8062 Calculus of gallbladder and bile duct with acute cholecystitis without obstruction: Principal | ICD-10-CM | POA: Diagnosis present

## 2014-08-05 DIAGNOSIS — Z96651 Presence of right artificial knee joint: Secondary | ICD-10-CM | POA: Diagnosis present

## 2014-08-05 DIAGNOSIS — Z8711 Personal history of peptic ulcer disease: Secondary | ICD-10-CM

## 2014-08-05 DIAGNOSIS — Z7982 Long term (current) use of aspirin: Secondary | ICD-10-CM

## 2014-08-05 DIAGNOSIS — K8050 Calculus of bile duct without cholangitis or cholecystitis without obstruction: Secondary | ICD-10-CM | POA: Diagnosis not present

## 2014-08-05 LAB — CBC WITH DIFFERENTIAL/PLATELET
Basophils Absolute: 0 10*3/uL (ref 0.0–0.1)
Basophils Relative: 1 % (ref 0–1)
EOS ABS: 0.3 10*3/uL (ref 0.0–0.7)
Eosinophils Relative: 4 % (ref 0–5)
HCT: 41.8 % (ref 36.0–46.0)
Hemoglobin: 14 g/dL (ref 12.0–15.0)
Lymphocytes Relative: 13 % (ref 12–46)
Lymphs Abs: 0.8 10*3/uL (ref 0.7–4.0)
MCH: 31.9 pg (ref 26.0–34.0)
MCHC: 33.5 g/dL (ref 30.0–36.0)
MCV: 95.2 fL (ref 78.0–100.0)
MONO ABS: 0.5 10*3/uL (ref 0.1–1.0)
Monocytes Relative: 7 % (ref 3–12)
Neutro Abs: 4.8 10*3/uL (ref 1.7–7.7)
Neutrophils Relative %: 75 % (ref 43–77)
PLATELETS: 242 10*3/uL (ref 150–400)
RBC: 4.39 MIL/uL (ref 3.87–5.11)
RDW: 12.9 % (ref 11.5–15.5)
WBC: 6.4 10*3/uL (ref 4.0–10.5)

## 2014-08-05 LAB — COMPREHENSIVE METABOLIC PANEL
ALBUMIN: 3.6 g/dL (ref 3.5–5.0)
ALT: 323 U/L — AB (ref 14–54)
AST: 471 U/L — AB (ref 15–41)
Alkaline Phosphatase: 145 U/L — ABNORMAL HIGH (ref 38–126)
Anion gap: 9 (ref 5–15)
BUN: 16 mg/dL (ref 6–20)
CALCIUM: 9.4 mg/dL (ref 8.9–10.3)
CO2: 27 mmol/L (ref 22–32)
Chloride: 101 mmol/L (ref 101–111)
Creatinine, Ser: 1.01 mg/dL — ABNORMAL HIGH (ref 0.44–1.00)
GFR calc Af Amer: 59 mL/min — ABNORMAL LOW (ref >60)
GFR calc non Af Amer: 51 mL/min — ABNORMAL LOW (ref >60)
GLUCOSE: 144 mg/dL — AB (ref 65–99)
POTASSIUM: 4 mmol/L (ref 3.5–5.1)
Sodium: 137 mmol/L (ref 135–145)
TOTAL PROTEIN: 7 g/dL (ref 6.5–8.1)
Total Bilirubin: 3.1 mg/dL — ABNORMAL HIGH (ref 0.3–1.2)

## 2014-08-05 LAB — I-STAT TROPONIN, ED: TROPONIN I, POC: 0 ng/mL (ref 0.00–0.08)

## 2014-08-05 LAB — BRAIN NATRIURETIC PEPTIDE: B NATRIURETIC PEPTIDE 5: 54.1 pg/mL (ref 0.0–100.0)

## 2014-08-05 LAB — LIPASE, BLOOD: LIPASE: 28 U/L (ref 22–51)

## 2014-08-05 LAB — LACTIC ACID, PLASMA: LACTIC ACID, VENOUS: 1.6 mmol/L (ref 0.5–2.0)

## 2014-08-05 MED ORDER — CEFTRIAXONE SODIUM IN DEXTROSE 40 MG/ML IV SOLN
2.0000 g | INTRAVENOUS | Status: DC
  Administered 2014-08-05: 2 g via INTRAVENOUS
  Filled 2014-08-05 (×2): qty 50

## 2014-08-05 MED ORDER — ALUM & MAG HYDROXIDE-SIMETH 200-200-20 MG/5ML PO SUSP: 30.0000 mL | Freq: Four times a day (QID) | ORAL | Status: DC | PRN

## 2014-08-05 MED ORDER — MORPHINE SULFATE 2 MG/ML IJ SOLN: 2.0000 mg | INTRAMUSCULAR | Status: DC | PRN

## 2014-08-05 MED ORDER — LEVOTHYROXINE SODIUM 112 MCG PO TABS
112.0000 ug | ORAL_TABLET | Freq: Every day | ORAL | Status: DC
  Administered 2014-08-05 – 2014-08-07 (×3): 112 ug via ORAL
  Filled 2014-08-05 (×3): qty 1

## 2014-08-05 MED ORDER — SODIUM CHLORIDE 0.9 % IV BOLUS (SEPSIS)
1000.0000 mL | Freq: Once | INTRAVENOUS | Status: AC
Start: 2014-08-05 — End: 2014-08-05
  Administered 2014-08-05: 1000 mL via INTRAVENOUS

## 2014-08-05 MED ORDER — PANTOPRAZOLE SODIUM 40 MG IV SOLR
40.0000 mg | INTRAVENOUS | Status: DC
  Administered 2014-08-05 – 2014-08-07 (×2): 40 mg via INTRAVENOUS
  Filled 2014-08-05: qty 40

## 2014-08-05 MED ORDER — HEPARIN SODIUM (PORCINE) 5000 UNIT/ML IJ SOLN
5000.0000 [IU] | Freq: Three times a day (TID) | INTRAMUSCULAR | Status: DC
  Administered 2014-08-05: 5000 [IU] via SUBCUTANEOUS
  Filled 2014-08-05 (×2): qty 1

## 2014-08-05 MED ORDER — SODIUM CHLORIDE 0.9 % IV SOLN
INTRAVENOUS | Status: DC
  Administered 2014-08-06: 18:00:00 via INTRAVENOUS

## 2014-08-05 MED ORDER — DEXTROSE-NACL 5-0.9 % IV SOLN
INTRAVENOUS | Status: DC
  Administered 2014-08-05: 11:00:00 via INTRAVENOUS

## 2014-08-05 MED ORDER — DEXTROSE 5 % IV SOLN
1.0000 g | Freq: Once | INTRAVENOUS | Status: AC
  Administered 2014-08-05: 1 g via INTRAVENOUS
  Filled 2014-08-05: qty 10

## 2014-08-05 MED ORDER — MORPHINE SULFATE 4 MG/ML IJ SOLN: 4.0000 mg | Freq: Once | INTRAMUSCULAR | Status: DC

## 2014-08-05 MED ORDER — ONDANSETRON HCL 4 MG PO TABS: 4.0000 mg | ORAL_TABLET | Freq: Four times a day (QID) | ORAL | Status: DC | PRN

## 2014-08-05 MED ORDER — ASPIRIN EC 81 MG PO TBEC
81.0000 mg | DELAYED_RELEASE_TABLET | Freq: Every day | ORAL | Status: DC
Start: 2014-08-05 — End: 2014-08-07
  Administered 2014-08-05 – 2014-08-07 (×2): 81 mg via ORAL
  Filled 2014-08-05 (×2): qty 1

## 2014-08-05 MED ORDER — ACETAMINOPHEN 650 MG RE SUPP: 650.0000 mg | Freq: Four times a day (QID) | RECTAL | Status: DC | PRN

## 2014-08-05 MED ORDER — GADOBENATE DIMEGLUMINE 529 MG/ML IV SOLN
18.0000 mL | Freq: Once | INTRAVENOUS | Status: AC | PRN
  Administered 2014-08-05: 18 mL via INTRAVENOUS

## 2014-08-05 MED ORDER — ACETAMINOPHEN 325 MG PO TABS: 650.0000 mg | ORAL_TABLET | Freq: Four times a day (QID) | ORAL | Status: DC | PRN

## 2014-08-05 MED ORDER — SENNOSIDES-DOCUSATE SODIUM 8.6-50 MG PO TABS: 1.0000 | ORAL_TABLET | Freq: Every evening | ORAL | Status: DC | PRN

## 2014-08-05 MED ORDER — ONDANSETRON HCL 4 MG/2ML IJ SOLN: 4.0000 mg | Freq: Four times a day (QID) | INTRAMUSCULAR | Status: DC | PRN

## 2014-08-05 NOTE — Consult Note (Signed)
CARDIOLOGY CONSULT NOTE   Patient ID: [name] MRN: [id number], DOB/AGE: 79-08-1931   Admit date: 08/05/2014 Date of Consult: 08/05/2014  Primary Physician: Janice Huh, MD Primary Cardiologist: [name]  Reason for consult:  Preop evaluation  Problem List  Past Medical History  Diagnosis Date  . Coronary artery disease   . Hypercholesteremia   . Hypothyroidism   . Osteopenia   . PUD (peptic ulcer disease)   . Rheumatic fever   . Atypical chest pain   . CKD (chronic kidney disease), stage III   . SVT (supraventricular tachycardia) October 2014    converted with vagal maneuver  . Vitamin D deficiency   . Aortic stenosis     moderate by echo 06/2013  . Diastolic dysfunction   . Cancer 2006    SURGERY AND CHEMO AND RADIATION   . Breast cancer, right breast     Past Surgical History  Procedure Laterality Date  . Thyroid surgery      NODULE REMOVED  . Total hip arthroplasty Left 2003  . Replacement total knee Right 2009  . Breast surgery      RIGHT LUMPECTOMY AND REMOVAL OF LYMPH NODES  . Total hip arthroplasty Right 08/28/2013    Procedure: RIGHT TOTAL HIP ARTHROPLASTY ANTERIOR APPROACH;  Surgeon: Janice Pole, MD;  Location: [facility];  Service: Orthopedics;  Laterality: Right;     Allergies  Allergies  Allergen Reactions  . Atorvastatin     Myalgias 10/2011  . Compazine [Prochlorperazine]     unknown  . Fenofibrate     Rash 09/2011  . Floxin [Ofloxacin]     unknown  . Livalo [Pitavastatin] Other (See Comments)    Leg pain  . Pravastatin Sodium     mylgias 09/2011  . Simvastatin     Elevated lft's 06/2011    HPI   [name] is a 81 y.o. female with a history of rheumatic fever as a child, moderate AS with mild AI, SVT, diastolic dysfunction with chronic diastolic CHF who is being followed in the clinic by [name].  She presented yesterday with abdominal pain and was diagnosed with an acute cholecystitis and secondary elevation of  liver enzymes. A laparoscopic cholecystectomy is planned.  She was last seen in the office in April 2016 when she was stable, had no signs of angina, CHF and underwent a repeat echocardiogram in June 2016. Echo showed stable moderate aortic stenosis, preserved LVEF, grade 2 diastolic dysfunction and normal RVSP.  Since then she denies any change, she has no angina, no LE edema, palpitations, syncope, no orthopnea or PND. She is fairly active with no limitations.   Inpatient Medications  . aspirin EC  81 mg Oral Daily  . heparin  5,000 Units Subcutaneous 3 times per day  . levothyroxine  112 mcg Oral QAC breakfast  .  morphine injection  4 mg Intravenous Once    Family History Family History  Problem Relation Age of Onset  . Heart disease Mother   . Heart disease Father      Social History History   Social History  . Marital Status: Widowed    Spouse Name: N/A  . Number of Children: N/A  . Years of Education: N/A   Occupational History  . Not on file.   Social History Main Topics  . Smoking status: Never Smoker   . Smokeless tobacco: Never Used  . Alcohol Use: No  . Drug Use: No  . Sexual  Activity: Not on file   Other Topics Concern  . Not on file   Social History Narrative     Review of Systems  General:  No chills, fever, night sweats or weight changes.  Cardiovascular:  No chest pain, dyspnea on exertion, edema, orthopnea, palpitations, paroxysmal nocturnal dyspnea. Dermatological: No rash, lesions/masses Respiratory: No cough, dyspnea Urologic: No hematuria, dysuria Abdominal:   No nausea, vomiting, diarrhea, bright red blood per rectum, melena, or hematemesis Neurologic:  No visual changes, wkns, changes in mental status. All other systems reviewed and are otherwise negative except as noted above.  Physical Exam  Blood pressure 115/51, pulse 71, temperature 98 F (36.7 C), temperature source Oral, resp. rate 16, height 5\' 3"  (1.6 m), weight 183 lb  (83.008 kg), SpO2 99 %.  General: Pleasant, NAD Psych: Normal affect. Neuro: Alert and oriented X 3. Moves all extremities spontaneously. HEENT: Normal  Neck: Supple without bruits or JVD. Lungs:  Resp regular and unlabored, CTA. Heart: RRR no s3, s4, 3/6 holosystolic murmur. Abdomen: Soft, non-tender, non-distended, BS + x 4.  Extremities: No clubbing, cyanosis or edema. DP/PT/Radials 2+ and equal bilaterally.  Labs  No results for input(s): CKTOTAL, CKMB, TROPONINI in the last 72 hours. Lab Results  Component Value Date   WBC 6.4 08/05/2014   HGB 14.0 08/05/2014   HCT 41.8 08/05/2014   MCV 95.2 08/05/2014   PLT 242 08/05/2014    Recent Labs Lab 08/05/14 0305  NA 137  K 4.0  CL 101  CO2 27  BUN 16  CREATININE 1.01*  CALCIUM 9.4  PROT 7.0  BILITOT 3.1*  ALKPHOS 145*  ALT 323*  AST 471*  GLUCOSE 144*   Radiology/Studies  Dg Chest 2 View  08/05/2014   CLINICAL DATA:  Cough, back pain and abdomen pain  EXAM: CHEST  2 VIEW  COMPARISON:  10/21/2012  FINDINGS: There is mild interstitial coarsening. There is no airspace consolidation. There is no effusion. The pulmonary vasculature is normal.  IMPRESSION: Mild interstitial coarsening.  This is of indeterminate chronicity.   Electronically Signed   By: [name] M.D.   On: 08/05/2014 02:58   US Abdomen Limited  08/05/2014   CLINICAL DATA:  Right upper quadrant abdominal pain  EXAM: US ABDOMEN LIMITED - RIGHT UPPER QUADRANT  COMPARISON:  None.  FINDINGS: Gallbladder:  Multiple mobile calculi are present in the gallbladder lumen. There is mild gallbladder wall thickening, 4 mm. The patient was tender over the gallbladder.  Common bile duct:  Diameter: 4 mm  Liver:  No focal lesion identified. Within normal limits in parenchymal echogenicity.  IMPRESSION: Cholelithiasis. Mild gallbladder mural thickening and mild tenderness. This could represent cholecystitis.   Electronically Signed   By: [name] M.D.   On:  08/05/2014 04:13   Echocardiogram - 06/22/2014 - Left ventricle: The cavity size was normal. There was mild concentric hypertrophy. Systolic function was normal. The estimated ejection fraction was in the range of 55% to 60%. Wall motion was normal; there were no regional wall motion abnormalities. Features are consistent with a pseudonormal left ventricular filling pattern, with concomitant abnormal relaxation and increased filling pressure (grade 2 diastolic dysfunction). There was no evidence of elevated ventricular filling pressure by Doppler parameters. - Aortic valve: There was mild regurgitation. Peak gradient 58 mmHg. Mean gradient (S): 32 mm Hg. Valve area (VTI): 0.87 cm^2. Valve area (Vmean): 0.84 cm^2. - Aortic root: The aortic root was normal in size. - Mitral valve: There was mild  regurgitation. - Left atrium: The atrium was mildly dilated. - Right ventricle: The cavity size was normal. Wall thickness was normal. Systolic function was normal. - Right atrium: The atrium was normal in size. - Tricuspid valve: There was trivial regurgitation. - Pulmonary arteries: Systolic pressure was within the normal range. - Inferior vena cava: The vessel was normal in size. - Pericardium, extracardiac: There was no pericardial effusion.  Impressions:  - Compared to the prior study there is no significant change, aortic stenosis is still in moderate range.  ECG: SR, early R wave transition    ASSESSMENT AND PLAN  81 year old female with an acute cholecystitis requiring laparoscopic cholecystectomy.  Considering her underlying cardiac conditions - moderate aortic stenosis and chronic diastolic CHF the patient is considered an intermediate risk for a moderate risk surgery. However she has not absolute contraindications to surgery - no angina, no critical valvular stenosis and is well compensated from CHF standpoint. She is fairly active and can certainly  achieve at least 4 METS.  I would recommend to avoid significant fluid overload during the surgery. She can proceed to surgery today, no further work up is necessary.  Signed, Dorothy Spark, MD, [facility] 08/05/2014, 8:34 AM

## 2014-08-05 NOTE — Telephone Encounter (Signed)
Patient is hospitalized with cholecystitis.

## 2014-08-05 NOTE — ED Notes (Signed)
Per EMS, pt started having left shoulder pain last night, and epigastric pain starting yesterday morning. Pt has a hx of this same feeling, and her doctor told her to call EMS if the feeling happened again. Pt was 88% on RA for EMS, and placed on 4L. Pt given 324 ASA, and 3 NTG en route. Pt hypotensive after nitro.

## 2014-08-05 NOTE — Progress Notes (Signed)
Patient ID: [name], female   DOB: 1931-08-04, 81 y.o.   MRN: [id number]     [facility]      [address] [place], [place] [address]    Phone: [phone] FAX: [phone]     Subjective: Soer.  No n/v.  LFTs up bili 3.1  Objective:  Vital signs:  Filed Vitals:   08/05/14 0530 08/05/14 0600 08/05/14 0615 08/05/14 0650  BP: 135/54 126/66 125/55 115/51  Pulse: 75 75 72 71  Temp:      TempSrc:      Resp: $Remo'23 18 16 16  'lFdhs$ Height:      Weight:      SpO2: 99% 99% 99% 99%    Last BM Date: 08/04/14  Intake/Output   Yesterday:    This shift:     Physical Exam: General: Pt awake/alert/oriented x4 in no acute distress Abdomen: Soft.  Nondistended.  TTP RUQ, murphy's sign.  No evidence of peritonitis.  No incarcerated hernias.   Problem List:   Principal Problem:   Cholecystitis Active Problems:   Aortic stenosis   Chronic diastolic heart failure   Hypotension   Breast cancer, right breast   PUD (peptic ulcer disease)   Hypothyroidism   Hypercholesteremia    Results:   Labs: Results for orders placed or performed during the hospital encounter of 08/05/14 (from the past 48 hour(s))  Brain natriuretic peptide     Status: None   Collection Time: 08/05/14  3:05 AM  Result Value Ref Range   B Natriuretic Peptide 54.1 0.0 - 100.0 pg/mL  CBC with Differential     Status: None   Collection Time: 08/05/14  3:05 AM  Result Value Ref Range   WBC 6.4 4.0 - 10.5 K/uL   RBC 4.39 3.87 - 5.11 MIL/uL   Hemoglobin 14.0 12.0 - 15.0 g/dL   HCT 41.8 36.0 - 46.0 %   MCV 95.2 78.0 - 100.0 fL   MCH 31.9 26.0 - 34.0 pg   MCHC 33.5 30.0 - 36.0 g/dL   RDW 12.9 11.5 - 15.5 %   Platelets 242 150 - 400 K/uL   Neutrophils Relative % 75 43 - 77 %   Neutro Abs 4.8 1.7 - 7.7 K/uL   Lymphocytes Relative 13 12 - 46 %   Lymphs Abs 0.8 0.7 - 4.0 K/uL   Monocytes Relative 7 3 - 12 %   Monocytes Absolute 0.5 0.1 - 1.0 K/uL   Eosinophils Relative 4 0 - 5 %   Eosinophils Absolute 0.3 0.0 - 0.7 K/uL   Basophils Relative 1 0 - 1 %   Basophils Absolute 0.0 0.0 - 0.1 K/uL  Comprehensive metabolic panel     Status: Abnormal   Collection Time: 08/05/14  3:05 AM  Result Value Ref Range   Sodium 137 135 - 145 mmol/L   Potassium 4.0 3.5 - 5.1 mmol/L   Chloride 101 101 - 111 mmol/L   CO2 27 22 - 32 mmol/L   Glucose, Bld 144 (H) 65 - 99 mg/dL   BUN 16 6 - 20 mg/dL   Creatinine, Ser 1.01 (H) 0.44 - 1.00 mg/dL   Calcium 9.4 8.9 - 10.3 mg/dL   Total Protein 7.0 6.5 - 8.1 g/dL   Albumin 3.6 3.5 - 5.0 g/dL   AST 471 (H) 15 - 41 U/L   ALT 323 (H) 14 - 54 U/L   Alkaline Phosphatase 145 (H) 38 - 126 U/L  Total Bilirubin 3.1 (H) 0.3 - 1.2 mg/dL   GFR calc non Af Amer 51 (L) >60 mL/min   GFR calc Af Amer 59 (L) >60 mL/min    Comment: (NOTE) The eGFR has been calculated using the CKD EPI equation. This calculation has not been validated in all clinical situations. eGFR's persistently <60 mL/min signify possible Chronic Kidney Disease.    Anion gap 9 5 - 15  Lactic acid, plasma     Status: None   Collection Time: 08/05/14  3:05 AM  Result Value Ref Range   Lactic Acid, Venous 1.6 0.5 - 2.0 mmol/L  Lipase, blood     Status: None   Collection Time: 08/05/14  3:05 AM  Result Value Ref Range   Lipase 28 22 - 51 U/L  I-stat troponin, ED     Status: None   Collection Time: 08/05/14  3:08 AM  Result Value Ref Range   Troponin i, poc 0.00 0.00 - 0.08 ng/mL   Comment 3            Comment: Due to the release kinetics of cTnI, a negative result within the first hours of the onset of symptoms does not rule out myocardial infarction with certainty. If myocardial infarction is still suspected, repeat the test at appropriate intervals.     Imaging / Studies: Dg Chest 2 View  08/05/2014   CLINICAL DATA:  Cough, back pain and abdomen pain  EXAM: CHEST  2 VIEW  COMPARISON:  10/21/2012  FINDINGS: There is mild interstitial  coarsening. There is no airspace consolidation. There is no effusion. The pulmonary vasculature is normal.  IMPRESSION: Mild interstitial coarsening.  This is of indeterminate chronicity.   Electronically Signed   By: Andreas Newport M.D.   On: 08/05/2014 02:58   US Abdomen Limited  08/05/2014   CLINICAL DATA:  Right upper quadrant abdominal pain  EXAM: US ABDOMEN LIMITED - RIGHT UPPER QUADRANT  COMPARISON:  None.  FINDINGS: Gallbladder:  Multiple mobile calculi are present in the gallbladder lumen. There is mild gallbladder wall thickening, 4 mm. The patient was tender over the gallbladder.  Common bile duct:  Diameter: 4 mm  Liver:  No focal lesion identified. Within normal limits in parenchymal echogenicity.  IMPRESSION: Cholelithiasis. Mild gallbladder mural thickening and mild tenderness. This could represent cholecystitis.   Electronically Signed   By: Andreas Newport M.D.   On: 08/05/2014 04:13    Medications / Allergies:  Scheduled Meds: . aspirin EC  81 mg Oral Daily  . heparin  5,000 Units Subcutaneous 3 times per day  . levothyroxine  112 mcg Oral QAC breakfast  .  morphine injection  4 mg Intravenous Once   Continuous Infusions: . sodium chloride     PRN Meds:.acetaminophen **OR** acetaminophen, alum & mag hydroxide-simeth, morphine injection, ondansetron **OR** ondansetron (ZOFRAN) IV, senna-docusate  Antibiotics: Anti-infectives    Start     Dose/Rate Route Frequency Ordered Stop   08/05/14 0545  cefTRIAXone (ROCEPHIN) 1 g in dextrose 5 % 50 mL IVPB     1 g 100 mL/hr over 30 Minutes Intravenous  Once 08/05/14 0535 08/05/14 0618        Assessment/Plan Acute cholecystitis  -resume rocephin -lap chole following MRCP -intermediate risk for a moderate risk surgery per cards(moderate aortic stenosis, dCHF) Transaminitis -MRCP, GI consult to rule out a CBD stone FEN-NPO, gentle IVF  Erby Pian, ANP-BC [facility] Pager [phone](7A-4:30P) For  consults and floor pages call [phone](7A-4:30P)  08/05/2014  9:17 AM    

## 2014-08-05 NOTE — Consult Note (Signed)
Reason for Consult: Abnormal LFT's with gallstones-needs an ERCP. Referring Physician: THP  [name] is an 81 y.o. female.  HPI: 81 year old white female, with multiple medical issues listed below, presents with a 3 week history of RUQ pain with chest pain, nausea and vomiting after meals. Her first "episode" was 2 weeks ago and then she had another "attack" last night. She claims the pain radiated to her right shoulder and her back as well. She denies having any other GI problems. She was brought to the hospital by EMS and given NTG x 3 on the way to the hospital. Initially she was noted have gallstones with gallbladder wall thickening on an abdominal ultrasound with elevated LFT's. She was found to have acute calculous cholecystis with a CBD stone on an MRCP done earlier today.  Past Medical History  Diagnosis Date  . Coronary artery disease   . Hypercholesteremia   . Hypothyroidism   . Osteopenia   . PUD (peptic ulcer disease)   . Rheumatic fever   . Atypical chest pain   . CKD (chronic kidney disease), stage III   . SVT (supraventricular tachycardia) October 2014    converted with vagal maneuver  . Vitamin D deficiency   . Aortic stenosis     moderate by echo 06/2013  . Diastolic dysfunction   . Cancer 2006    SURGERY AND CHEMO AND RADIATION   . Breast cancer, right breast    Past Surgical History  Procedure Laterality Date  . Thyroid surgery      NODULE REMOVED  . Total hip arthroplasty Left 2003  . Replacement total knee Right 2009  . Breast surgery      RIGHT LUMPECTOMY AND REMOVAL OF LYMPH NODES  . Total hip arthroplasty Right 08/28/2013    Procedure: RIGHT TOTAL HIP ARTHROPLASTY ANTERIOR APPROACH;  Surgeon: Mauri Pole, MD;  Location: [facility];  Service: Orthopedics;  Laterality: Right;   Family History  Problem Relation Age of Onset  . Heart disease Mother   . Heart disease Father    Social History:  reports that she has never smoked. She has never used  smokeless tobacco. She reports that she does not drink alcohol or use illicit drugs.  Allergies:  Allergies  Allergen Reactions  . Atorvastatin     Myalgias 10/2011  . Compazine [Prochlorperazine]     unknown  . Fenofibrate     Rash 09/2011  . Floxin [Ofloxacin]     unknown  . Livalo [Pitavastatin] Other (See Comments)    Leg pain  . Pravastatin Sodium     mylgias 09/2011  . Simvastatin     Elevated lft's 06/2011   Medications: I have reviewed the patient's current medications.  Results for orders placed or performed during the hospital encounter of 08/05/14 (from the past 48 hour(s))  Brain natriuretic peptide     Status: None   Collection Time: 08/05/14  3:05 AM  Result Value Ref Range   B Natriuretic Peptide 54.1 0.0 - 100.0 pg/mL  CBC with Differential     Status: None   Collection Time: 08/05/14  3:05 AM  Result Value Ref Range   WBC 6.4 4.0 - 10.5 K/uL   RBC 4.39 3.87 - 5.11 MIL/uL   Hemoglobin 14.0 12.0 - 15.0 g/dL   HCT 41.8 36.0 - 46.0 %   MCV 95.2 78.0 - 100.0 fL   MCH 31.9 26.0 - 34.0 pg   MCHC 33.5 30.0 - 36.0 g/dL  RDW 12.9 11.5 - 15.5 %   Platelets 242 150 - 400 K/uL   Neutrophils Relative % 75 43 - 77 %   Neutro Abs 4.8 1.7 - 7.7 K/uL   Lymphocytes Relative 13 12 - 46 %   Lymphs Abs 0.8 0.7 - 4.0 K/uL   Monocytes Relative 7 3 - 12 %   Monocytes Absolute 0.5 0.1 - 1.0 K/uL   Eosinophils Relative 4 0 - 5 %   Eosinophils Absolute 0.3 0.0 - 0.7 K/uL   Basophils Relative 1 0 - 1 %   Basophils Absolute 0.0 0.0 - 0.1 K/uL  Comprehensive metabolic panel     Status: Abnormal   Collection Time: 08/05/14  3:05 AM  Result Value Ref Range   Sodium 137 135 - 145 mmol/L   Potassium 4.0 3.5 - 5.1 mmol/L   Chloride 101 101 - 111 mmol/L   CO2 27 22 - 32 mmol/L   Glucose, Bld 144 (H) 65 - 99 mg/dL   BUN 16 6 - 20 mg/dL   Creatinine, Ser 1.01 (H) 0.44 - 1.00 mg/dL   Calcium 9.4 8.9 - 10.3 mg/dL   Total Protein 7.0 6.5 - 8.1 g/dL   Albumin 3.6 3.5 - 5.0 g/dL    AST 471 (H) 15 - 41 U/L   ALT 323 (H) 14 - 54 U/L   Alkaline Phosphatase 145 (H) 38 - 126 U/L   Total Bilirubin 3.1 (H) 0.3 - 1.2 mg/dL   GFR calc non Af Amer 51 (L) >60 mL/min   GFR calc Af Amer 59 (L) >60 mL/min    Comment: (NOTE) The eGFR has been calculated using the CKD EPI equation. This calculation has not been validated in all clinical situations. eGFR's persistently <60 mL/min signify possible Chronic Kidney Disease.    Anion gap 9 5 - 15  Lactic acid, plasma     Status: None   Collection Time: 08/05/14  3:05 AM  Result Value Ref Range   Lactic Acid, Venous 1.6 0.5 - 2.0 mmol/L  Lipase, blood     Status: None   Collection Time: 08/05/14  3:05 AM  Result Value Ref Range   Lipase 28 22 - 51 U/L  I-stat troponin, ED     Status: None   Collection Time: 08/05/14  3:08 AM  Result Value Ref Range   Troponin i, poc 0.00 0.00 - 0.08 ng/mL   Comment 3            Comment: Due to the release kinetics of cTnI, a negative result within the first hours of the onset of symptoms does not rule out myocardial infarction with certainty. If myocardial infarction is still suspected, repeat the test at appropriate intervals.    Dg Chest 2 View  08/05/2014   CLINICAL DATA:  Cough, back pain and abdomen pain  EXAM: CHEST  2 VIEW  COMPARISON:  10/21/2012  FINDINGS: There is mild interstitial coarsening. There is no airspace consolidation. There is no effusion. The pulmonary vasculature is normal.  IMPRESSION: Mild interstitial coarsening.  This is of indeterminate chronicity.   Electronically Signed   By: Andreas Newport M.D.   On: 08/05/2014 02:58   Mr 3d Recon At Scanner  08/05/2014   CLINICAL DATA:  81 year old female inpatient with intermittent abdominal pain and nausea for 5 days. Cholelithiasis, gallbladder wall thickening and tenderness on abdominal sonogram from suggestive of acute cholecystitis.  EXAM: MRI ABDOMEN WITHOUT AND WITH CONTRAST (INCLUDING MRCP)  TECHNIQUE: Multiplanar  multisequence  MR imaging of the abdomen was performed both before and after the administration of intravenous contrast. Heavily T2-weighted images of the biliary and pancreatic ducts were obtained, and three-dimensional MRCP images were rendered by post processing.  CONTRAST:  39m MULTIHANCE GADOBENATE DIMEGLUMINE 529 MG/ML IV SOLN  COMPARISON:  CT abdomen/pelvis from 06/09/2004. Abdominal sonogram from earlier today.  FINDINGS: Lower chest:  Clear lung bases.  Hepatobiliary: Normal liver size mild diffuse hepatic steatosis. No liver masses. Mildly distended gallbladder contains numerous subcentimeter gallstones, including gallstones within the gallbladder neck. There is moderate diffuse gallbladder wall thickening, gallbladder wall mucosal hyperenhancement and trace pericholecystic fluid. No intrahepatic biliary ductal dilatation. Normal common bile duct diameter of 5 mm. On the coronal T2 weighted and MRCP images, there is 2 mm filling defect within the distal common bile duct, suggestive of a tiny nonobstructing choledocholith.  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 Otherwise normal pancreas, with no main pancreatic duct dilation and no evidence of pancreas divisum.  Spleen: Normal.  Adrenals/Urinary Tract: Normal adrenals. Normal kidneys, with no hydronephrosis. No renal lesions.  Stomach/Bowel: Collapsed and grossly normal stomach. Normal caliber visualized small and large bowel.  Vascular/Lymphatic: Mild ectasia of the infrarenal abdominal aorta, maximum diameter 2.3 cm. No abdominal lymphadenopathy. Portal, splenic and hepatic veins are patent.  Other: No ascites or focal fluid collection.  Musculoskeletal: No suspicious focal osseous lesions.  IMPRESSION: 1. Mildly distended gallbladder with cholelithiasis. Moderate diffuse gallbladder wall thickening. Gallbladder wall mucosal hyperenhancement. Trace pericholecystic  fluid. MRI findings suggest acute calculous cholecystitis. 2. No intrahepatic or extrahepatic biliary ductal dilatation. Suggestion of a tiny 2 mm nonobstructing choledocholith in the distal common bile duct. 3. 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 No pancreatic duct dilation. 4. Ectatic infrarenal abdominal aorta, 2.3 cm diameter. 5. Mild diffuse hepatic steatosis.  These results were called by telephone at the time of interpretation on 08/05/2014 at 5:16 pm to Dr. DClementeen Graham who verbally acknowledged these results.   Electronically Signed   By: JIlona SorrelM.D.   On: 08/05/2014 17:17   [place]Abdomen Limited  08/05/2014   CLINICAL DATA:  Right upper quadrant abdominal pain  EXAM: [place]ABDOMEN LIMITED - RIGHT UPPER QUADRANT  COMPARISON:  None.  FINDINGS: Gallbladder:  Multiple mobile calculi are present in the gallbladder lumen. There is mild gallbladder wall thickening, 4 mm. The patient was tender over the gallbladder.  Common bile duct:  Diameter: 4 mm  Liver:  No focal lesion identified. Within normal limits in parenchymal echogenicity.  IMPRESSION: Cholelithiasis. Mild gallbladder mural thickening and mild tenderness. This could represent cholecystitis.   Electronically Signed   By: DAndreas NewportM.D.   On: 08/05/2014 04:13   Mr AJeananne RamaW/wo Cm/mrcp  08/05/2014   CLINICAL DATA:  81year old female inpatient with intermittent abdominal pain and nausea for 5 days. Cholelithiasis, gallbladder wall thickening and tenderness on abdominal sonogram from suggestive of acute cholecystitis.  EXAM: MRI ABDOMEN WITHOUT AND WITH CONTRAST (INCLUDING MRCP)  TECHNIQUE: Multiplanar multisequence MR imaging of the abdomen was performed both before and after the administration of intravenous contrast. Heavily T2-weighted images of the biliary and pancreatic ducts were obtained, and three-dimensional MRCP images were rendered by post processing.  CONTRAST:  112mMULTIHANCE GADOBENATE DIMEGLUMINE 529 MG/ML  IV SOLN  COMPARISON:  CT abdomen/pelvis from 06/09/2004. Abdominal sonogram from earlier today.  FINDINGS: Lower chest:  Clear lung bases.  Hepatobiliary: Normal liver size mild diffuse hepatic steatosis. No liver masses. Mildly distended gallbladder contains numerous subcentimeter gallstones, including gallstones within the gallbladder neck. There is moderate diffuse gallbladder wall thickening, gallbladder wall mucosal hyperenhancement and trace pericholecystic fluid. No intrahepatic biliary ductal dilatation. Normal common bile duct diameter of 5 mm. On the coronal T2 weighted and MRCP images, there is 2 mm filling defect within the distal common bile duct, suggestive of a tiny nonobstructing choledocholith.  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 Otherwise normal pancreas, with no main pancreatic duct dilation and no evidence of pancreas divisum.  Spleen: Normal.  Adrenals/Urinary Tract: Normal adrenals. Normal kidneys, with no hydronephrosis. No renal lesions.  Stomach/Bowel: Collapsed and grossly normal stomach. Normal caliber visualized small and large bowel.  Vascular/Lymphatic: Mild ectasia of the infrarenal abdominal aorta, maximum diameter 2.3 cm. No abdominal lymphadenopathy. Portal, splenic and hepatic veins are patent.  Other: No ascites or focal fluid collection.  Musculoskeletal: No suspicious focal osseous lesions.  IMPRESSION: 1. Mildly distended gallbladder with cholelithiasis. Moderate diffuse gallbladder wall thickening. Gallbladder wall mucosal hyperenhancement. Trace pericholecystic fluid. MRI findings suggest acute calculous cholecystitis. 2. No intrahepatic or extrahepatic biliary ductal dilatation. Suggestion of a tiny 2 mm nonobstructing choledocholith in the distal common bile duct. 3. 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 No pancreatic duct dilation. 4. Ectatic  infrarenal abdominal aorta, 2.3 cm diameter. 5. Mild diffuse hepatic steatosis.  These results were called by telephone at the time of interpretation on 08/05/2014 at 5:16 pm to Dr. Clementeen Graham, who verbally acknowledged these results.   Electronically Signed   By: Ilona Sorrel M.D.   On: 08/05/2014 17:17   Review of Systems  Constitutional: Positive for malaise/fatigue.  HENT: Negative.   Eyes: Negative.   Respiratory: Negative.   Cardiovascular: Positive for chest pain.  Gastrointestinal: Positive for nausea, vomiting and abdominal pain. Negative for blood in stool and melena.  Genitourinary: Negative.   Musculoskeletal: Positive for joint pain.  Skin: Negative.   Neurological: Positive for weakness.  Endo/Heme/Allergies: Negative.   Psychiatric/Behavioral: Negative.    Blood pressure 121/46, pulse 58, temperature 97.9 F (36.6 C), temperature source Oral, resp. rate 16, height _0  (1.6 m), weight 83.008 kg (183 lb), SpO2 95 %. Physical Exam  Constitutional: She is oriented to person, place, and time. She appears well-developed and well-nourished.  HENT:  Head: Normocephalic and atraumatic.  Eyes: Conjunctivae and EOM are normal. Pupils are equal, round, and reactive to light.  Neck: Normal range of motion. Neck supple.  Cardiovascular: Normal rate and regular rhythm.   Murmur heard.  Systolic murmur is present with a grade of 4/6  Respiratory: Effort normal and breath sounds normal.  GI: Soft. Bowel sounds are normal. She exhibits no distension. There is no tenderness. There is no rebound and no guarding.  Musculoskeletal: Normal range of motion.  Neurological: She is alert and oriented to person, place, and time.  Skin: Skin is warm and dry.  Psychiatric: She has a normal mood and affect. Her behavior is normal. Judgment and thought content normal.   Assessment/Plan: 1) Acute calculous cholecystitis with choledocholithiasis on MRCP and elevated LFT's-ERCP planned for tomorrow.  2)  Fatty liver/NASH  3) Aortic stenosis with mild AI.  4) Chronic diastolic CHF. 5) Hypothyroidism.  6) Hypercholesterolemia.  [name] 08/05/2014, 6:05 PM

## 2014-08-05 NOTE — H&P (Signed)
[facility] History and Physical  [name] WNU:[id number] DOB: 1931/10/18 DOA: 08/05/2014   Referring physician: Louellen Molder, MD PCP: Simona Huh, MD   Chief Complaint: Chest pain, abdominal pain  HPI: [name] is a 81 y.o. female with moderate aortic stenosis,  chronic diastolic CHF, peptic ulcer disease, hypothyroidism, hypercholesteremia, and a history of breast cancer presenting for chest pain, abdominal pain, nausea, and faintness x 5 days. Symptoms of abdominal pain and nausea began shortly after eating a biscuit with egg and cheese. Patient describes abdominal pain as a "tearing, turning" feeling, located mainly in upper and lower abdominal regions. Abdominal pain and nausea has been intermittent since onset, preventing patient from eating normal meals. Last night was the first occurrence of chest pain, located in the center of her chest, which radiated to the left side of her back. Nausea accompanied chest pain. No unusual shortness of breath. Denies fever, vomiting, or bowel changes. Patient had a similar episode of abdominal pain 3 weeks ago but also had accompanying vomiting and diarrhea. She went to her PCP at that time and was told it could be her gallbladder and to report to the emergency room if there was a recurrence.   In the ED, US abdomen indicates cholelithiasis and mild gallbladder mural thickening, which could represent cholecystitis.  CBD is 4 mm.   Patient was started on IV antibiotics and general surgery was consulted. Cardiology was also consulted for surgery clearance due to patient's  history of heart disease. SR, negative troponin in ED. Due to elevated transaminases, GI consult ordered.   Review of Systems:  Constitutional: Positive for: faintness. Denies weight loss, night sweats, fevers, chills, fatigue.  HEENT: Denies headaches, difficulty swallowing, or sore throat. Cardio-vascular: Positive for: chest pain dizziness. Denies orthopnea,  PND, swelling in lower extremities, anasarca, or palpitations  GI: Positive for: abdominal pain, nausea, loss in appetite. Denies heartburn, indigestion, vomiting, diarrhea, or change in bowel habits.  Resp: Positive for: shortness of breath (at baseline), non-productive cough. Denies excess mucus, productive cough, coughing up of blood. No change in color of mucus.No wheezing. No chest wall deformity  Skin: No rash or lesions.  GU: Denies dysuria, change in color of urine, no urgency or frequency. No flank pain.  Musculoskeletal: Positive for: left back pain. Denies joint pain or swelling. No decreased range of motion.  Psych: No change in mood or affect.  Past Medical History  Diagnosis Date  . Coronary artery disease   . Hypercholesteremia   . Hypothyroidism   . Osteopenia   . PUD (peptic ulcer disease)   . Rheumatic fever   . Atypical chest pain   . CKD (chronic kidney disease), stage III   . SVT (supraventricular tachycardia) October 2014    converted with vagal maneuver  . Vitamin D deficiency   . Aortic stenosis     moderate by echo 06/2013  . Diastolic dysfunction   . Cancer 2006    SURGERY AND CHEMO AND RADIATION   . Breast cancer, right breast    Past Surgical History  Procedure Laterality Date  . Thyroid surgery      NODULE REMOVED  . Total hip arthroplasty Left 2003  . Replacement total knee Right 2009  . Breast surgery      RIGHT LUMPECTOMY AND REMOVAL OF LYMPH NODES  . Total hip arthroplasty Right 08/28/2013    Procedure: RIGHT TOTAL HIP ARTHROPLASTY ANTERIOR APPROACH;  Surgeon: Mauri Pole, MD;  Location: [facility];  Service: Orthopedics;  Laterality: Right;   Social History:  reports that she has never smoked. She has never used smokeless tobacco. She reports that she does not drink alcohol or use illicit drugs.  40 years of secondhand smoke. Lives at home alone. Independent with ADLs.  Widowed.  Allergies  Allergen Reactions  . Atorvastatin     Myalgias  10/2011  . Compazine [Prochlorperazine]     unknown  . Fenofibrate     Rash 09/2011  . Floxin [Ofloxacin]     unknown  . Livalo [Pitavastatin] Other (See Comments)    Leg pain  . Pravastatin Sodium     mylgias 09/2011  . Simvastatin     Elevated lft's 06/2011    Family History  Problem Relation Age of Onset  . Heart disease Mother   . Heart disease Father    Mother - cholecystectomy   Prior to Admission medications   Medication Sig Start Date End Date Taking? Authorizing Provider  aspirin EC 81 MG tablet Take 81 mg by mouth daily.   Yes Historical Provider, MD  Calcium Carbonate-Vitamin D (CALCIUM + D PO) Take 1 tablet by mouth daily.   Yes Historical Provider, MD  Coenzyme Q10 (COQ-10 PO) Take 1 tablet by mouth daily.    Yes Historical Provider, MD  Cyanocobalamin (VITAMIN B-12 PO) Take 1 tablet by mouth daily.   Yes Historical Provider, MD  desloratadine (CLARINEX REDITAB) 5 MG disintegrating tablet Take 5 mg by mouth daily as needed (for allergy symptoms).   Yes Historical Provider, MD  fish oil-omega-3 fatty acids 1000 MG capsule Take 1 g by mouth daily.   Yes Historical Provider, MD  furosemide (LASIX) 20 MG tablet Take 20 mg by mouth daily.   Yes Historical Provider, MD  ibuprofen (ADVIL,MOTRIN) 200 MG tablet Take 200 mg by mouth every 6 (six) hours as needed for moderate pain.   Yes Historical Provider, MD  lansoprazole (PREVACID) 30 MG capsule Take 30 mg by mouth daily.   Yes Historical Provider, MD  levothyroxine (SYNTHROID, LEVOTHROID) 112 MCG tablet Take 112 mcg by mouth daily before breakfast.   Yes Historical Provider, MD  magnesium gluconate (MAGONATE) 500 MG tablet Take 500 mg by mouth 2 (two) times daily.    Yes Historical Provider, MD  Multiple Vitamin (MULTIVITAMIN WITH MINERALS) TABS tablet Take 1 tablet by mouth daily.   Yes Historical Provider, MD  potassium chloride (K-DUR,KLOR-CON) 10 MEQ tablet Take 10 mEq by mouth daily.   Yes Historical Provider, MD   Probiotic Product (PROBIOTIC DAILY PO) Take 1 tablet by mouth daily.    Yes Historical Provider, MD  Vitamin D, Ergocalciferol, (DRISDOL) 50000 UNITS CAPS capsule Take 50,000 Units by mouth every 7 (seven) days. Saturday   Yes Historical Provider, MD   Physical Exam: Filed Vitals:   08/05/14 0530 08/05/14 0600 08/05/14 0615 08/05/14 0650  BP: 135/54 126/66 125/55 115/51  Pulse: 75 75 72 71  Temp:      TempSrc:      Resp: 23 18 16 16   Height:      Weight:      SpO2: 99% 99% 99% 99%    Wt Readings from Last 3 Encounters:  08/05/14 83.008 kg (183 lb)  05/14/14 84.369 kg (186 lb)  11/05/13 82.918 kg (182 lb 12.8 oz)    General:  Patient is an obese elderly woman, lying supine in bed with sister at bedside, in no acute distress. Appears younger than stated age. Eyes: PERRL, normal  lids, irises & conjunctiva, no icterus ENT: grossly normal hearing, lips & tongue Neck: no LAD, masses, thyromegaly, or JVD. Cardiovascular: Normal rate, regular rhythm.  Murmur noted. No LE edema. Respiratory: CTA bilaterally, no w/r/r. Normal respiratory effort. Abdomen: soft, non-tender, non-distended. Hypoactive bowel sounds. Skin: no rash or induration seen on limited exam, no jaundice Musculoskeletal: grossly normal tone BUE/BLE Psychiatric: grossly normal mood and affect, speech fluent and appropriate Neurologic: grossly non-focal.          Labs on Admission:  Basic Metabolic Panel:  Recent Labs Lab 08/05/14 0305  NA 137  K 4.0  CL 101  CO2 27  GLUCOSE 144*  BUN 16  CREATININE 1.01*  CALCIUM 9.4   Liver Function Tests:  Recent Labs Lab 08/05/14 0305  AST 471*  ALT 323*  ALKPHOS 145*  BILITOT 3.1*  PROT 7.0  ALBUMIN 3.6    Recent Labs Lab 08/05/14 0305  LIPASE 28   CBC:  Recent Labs Lab 08/05/14 0305  WBC 6.4  NEUTROABS 4.8  HGB 14.0  HCT 41.8  MCV 95.2  PLT 242    BNP (last 3 results)  Recent Labs  08/05/14 0305  BNP 54.1    ProBNP (last 3  results)  Recent Labs  05/14/14 0828  PROBNP 90.0    Radiological Exams on Admission: Dg Chest 2 View  08/05/2014   CLINICAL DATA:  Cough, back pain and abdomen pain  EXAM: CHEST  2 VIEW  COMPARISON:  10/21/2012  FINDINGS: There is mild interstitial coarsening. There is no airspace consolidation. There is no effusion. The pulmonary vasculature is normal.  IMPRESSION: Mild interstitial coarsening.  This is of indeterminate chronicity.   Electronically Signed   By: Andreas Newport M.D.   On: 08/05/2014 02:58   US Abdomen Limited  08/05/2014   CLINICAL DATA:  Right upper quadrant abdominal pain  EXAM: US ABDOMEN LIMITED - RIGHT UPPER QUADRANT  COMPARISON:  None.  FINDINGS: Gallbladder:  Multiple mobile calculi are present in the gallbladder lumen. There is mild gallbladder wall thickening, 4 mm. The patient was tender over the gallbladder.  Common bile duct:  Diameter: 4 mm  Liver:  No focal lesion identified. Within normal limits in parenchymal echogenicity.  IMPRESSION: Cholelithiasis. Mild gallbladder mural thickening and mild tenderness. This could represent cholecystitis.   Electronically Signed   By: Andreas Newport M.D.   On: 08/05/2014 04:13    EKG: SR. Abnormal R-wave progression, early transition.  Assessment/Plan Principal Problem:   Cholecystitis Active Problems:   Hypotension   Chronic diastolic heart failure   Aortic stenosis   Breast cancer, right breast   PUD (peptic ulcer disease)   Hypothyroidism   Hypercholesteremia   Abnormal transaminases  Cholecystitis - patient is an obese female that has had intermittent and worsening abdominal pain, nausea, and chest pain, brought on by eating and affecting diet for the past 3 weeks. - US abdomen shows multiple mobile calculi in the gallbladder lumen, and mild gallbladder wall thickening at 64m. The patient was tender over the gallbladder during the [place]  - on IV fluids and Rocephin;  - general surgery and GI, Dr. MCollene Mareswere  consulted.  - Due to elevated LFTS, MRCP pending.  If negative for CBD stone patient will require IOC.  Lipase is normal.  Abnormal transaminases - likely related to cholecystitis, could be fatty liver disease - AST is 471, ALT is 323; bilirubin is also elevated at 3.1. - GI consulted  Aortic stenosis - last  Echo 06/12/14, preserved LV EF 55%-60%, moderate, no significant change from prior study 1 year earlier - cardiology consulted and met with patient at bedside for surgery clearance;    patient is considered an intermediate risk for a moderate risk surgery but has no absolute contraindications to surgery.   Chronic diastolic heart failure - on lasix 20 mg.  Will hold as she is NPO.  Hypotension - patient presented to ED (85/46).  Responded to IVF - hold lasix   Peptic ulcer disease - patient takes prevacid 30 mg at home - IV pantoprazole  Hypothyroidism - on synthroid  Hypercholesteremia - fish oil   Breast cancer, right breast - on no medications at this time - regularly scheduled outpatient follow-up with oncologist  Consultants:   General surgery  Cardiology  GI, Dr. Collene Mares  Code Status: full code DVT Prophylaxis:heparin, SCDs Family Communication: spoke with patient and sister at bedside about the plan Disposition Plan:  To home when ok with surgery.  Time spent: 45 minutes  Janice Kelp, PA-S Imogene Burn, [place] [facility] Pager [phone]

## 2014-08-05 NOTE — Consult Note (Signed)
Reason for Consult:abd pain Referring Physician: Dr. Eloise Harman LENICE KOPER is an 81 y.o. female.  HPI: Patient is a 81 year old female with a past medical history for CAD, aortic stenosis, congestive heart failure, comes in today secondary to abdominal pain. Patient states the abdominal pain began approximately 5 days ago. She states been on since then. She states that the pain that brought her in today began approximately 11 PM last night. She states that it's in the epigastrium and radiates straight to her back. Patient did have nausea and vomiting on Wednesday the onset of pain. She has not had any recent nausea vomiting.  Upon evaluation in the ER patient underwent ultrasound which revealed cholelithiasis and mild gallbladder wall thickening. Patient's laboratory studies reveal normal WBC count however LFTs are elevated to included T bili of 3.1. Surgical consultation was obtained secondary to further management.  Past Medical History  Diagnosis Date  . Coronary artery disease   . Hypercholesteremia   . Hypothyroidism   . Osteopenia   . PUD (peptic ulcer disease)   . Rheumatic fever   . Atypical chest pain   . CKD (chronic kidney disease), stage III   . SVT (supraventricular tachycardia) October 2014    converted with vagal maneuver  . Vitamin D deficiency   . Aortic stenosis     moderate by echo 06/2013  . Diastolic dysfunction   . Cancer 2006    SURGERY AND CHEMO AND RADIATION   . Breast cancer, right breast     Past Surgical History  Procedure Laterality Date  . Thyroid surgery      NODULE REMOVED  . Total hip arthroplasty Left 2003  . Replacement total knee Right 2009  . Breast surgery      RIGHT LUMPECTOMY AND REMOVAL OF LYMPH NODES  . Total hip arthroplasty Right 08/28/2013    Procedure: RIGHT TOTAL HIP ARTHROPLASTY ANTERIOR APPROACH;  Surgeon: Mauri Pole, MD;  Location: [facility];  Service: Orthopedics;  Laterality: Right;    Family History  Problem Relation Age  of Onset  . Heart disease Mother   . Heart disease Father     Social History:  reports that she has never smoked. She has never used smokeless tobacco. She reports that she does not drink alcohol or use illicit drugs.  Allergies:  Allergies  Allergen Reactions  . Atorvastatin     Myalgias 10/2011  . Compazine [Prochlorperazine]     unknown  . Fenofibrate     Rash 09/2011  . Floxin [Ofloxacin]     unknown  . Livalo [Pitavastatin] Other (See Comments)    Leg pain  . Pravastatin Sodium     mylgias 09/2011  . Simvastatin     Elevated lft's 06/2011    Medications: I have reviewed the patient's current medications.  Results for orders placed or performed during the hospital encounter of 08/05/14 (from the past 48 hour(s))  Brain natriuretic peptide     Status: None   Collection Time: 08/05/14  3:05 AM  Result Value Ref Range   B Natriuretic Peptide 54.1 0.0 - 100.0 pg/mL  CBC with Differential     Status: None   Collection Time: 08/05/14  3:05 AM  Result Value Ref Range   WBC 6.4 4.0 - 10.5 K/uL   RBC 4.39 3.87 - 5.11 MIL/uL   Hemoglobin 14.0 12.0 - 15.0 g/dL   HCT 41.8 36.0 - 46.0 %   MCV 95.2 78.0 - 100.0 fL   MCH  31.9 26.0 - 34.0 pg   MCHC 33.5 30.0 - 36.0 g/dL   RDW 12.9 11.5 - 15.5 %   Platelets 242 150 - 400 K/uL   Neutrophils Relative % 75 43 - 77 %   Neutro Abs 4.8 1.7 - 7.7 K/uL   Lymphocytes Relative 13 12 - 46 %   Lymphs Abs 0.8 0.7 - 4.0 K/uL   Monocytes Relative 7 3 - 12 %   Monocytes Absolute 0.5 0.1 - 1.0 K/uL   Eosinophils Relative 4 0 - 5 %   Eosinophils Absolute 0.3 0.0 - 0.7 K/uL   Basophils Relative 1 0 - 1 %   Basophils Absolute 0.0 0.0 - 0.1 K/uL  Comprehensive metabolic panel     Status: Abnormal   Collection Time: 08/05/14  3:05 AM  Result Value Ref Range   Sodium 137 135 - 145 mmol/L   Potassium 4.0 3.5 - 5.1 mmol/L   Chloride 101 101 - 111 mmol/L   CO2 27 22 - 32 mmol/L   Glucose, Bld 144 (H) 65 - 99 mg/dL   BUN 16 6 - 20 mg/dL    Creatinine, Ser 1.01 (H) 0.44 - 1.00 mg/dL   Calcium 9.4 8.9 - 10.3 mg/dL   Total Protein 7.0 6.5 - 8.1 g/dL   Albumin 3.6 3.5 - 5.0 g/dL   AST 471 (H) 15 - 41 U/L   ALT 323 (H) 14 - 54 U/L   Alkaline Phosphatase 145 (H) 38 - 126 U/L   Total Bilirubin 3.1 (H) 0.3 - 1.2 mg/dL   GFR calc non Af Amer 51 (L) >60 mL/min   GFR calc Af Amer 59 (L) >60 mL/min    Comment: (NOTE) The eGFR has been calculated using the CKD EPI equation. This calculation has not been validated in all clinical situations. eGFR's persistently <60 mL/min signify possible Chronic Kidney Disease.    Anion gap 9 5 - 15  Lactic acid, plasma     Status: None   Collection Time: 08/05/14  3:05 AM  Result Value Ref Range   Lactic Acid, Venous 1.6 0.5 - 2.0 mmol/L  Lipase, blood     Status: None   Collection Time: 08/05/14  3:05 AM  Result Value Ref Range   Lipase 28 22 - 51 U/L  I-stat troponin, ED     Status: None   Collection Time: 08/05/14  3:08 AM  Result Value Ref Range   Troponin i, poc 0.00 0.00 - 0.08 ng/mL   Comment 3            Comment: Due to the release kinetics of cTnI, a negative result within the first hours of the onset of symptoms does not rule out myocardial infarction with certainty. If myocardial infarction is still suspected, repeat the test at appropriate intervals.     Dg Chest 2 View  08/05/2014   CLINICAL DATA:  Cough, back pain and abdomen pain  EXAM: CHEST  2 VIEW  COMPARISON:  10/21/2012  FINDINGS: There is mild interstitial coarsening. There is no airspace consolidation. There is no effusion. The pulmonary vasculature is normal.  IMPRESSION: Mild interstitial coarsening.  This is of indeterminate chronicity.   Electronically Signed   By: Andreas Newport M.D.   On: 08/05/2014 02:58   US Abdomen Limited  08/05/2014   CLINICAL DATA:  Right upper quadrant abdominal pain  EXAM: US ABDOMEN LIMITED - RIGHT UPPER QUADRANT  COMPARISON:  None.  FINDINGS: Gallbladder:  Multiple mobile calculi  are  present in the gallbladder lumen. There is mild gallbladder wall thickening, 4 mm. The patient was tender over the gallbladder.  Common bile duct:  Diameter: 4 mm  Liver:  No focal lesion identified. Within normal limits in parenchymal echogenicity.  IMPRESSION: Cholelithiasis. Mild gallbladder mural thickening and mild tenderness. This could represent cholecystitis.   Electronically Signed   By: Andreas Newport M.D.   On: 08/05/2014 04:13    Review of Systems  Constitutional: Negative.  Negative for fever.  HENT: Negative.   Eyes: Negative.   Respiratory: Negative.   Cardiovascular: Negative.   Gastrointestinal: Positive for heartburn, nausea, vomiting and abdominal pain. Negative for diarrhea.  Genitourinary: Negative.   Musculoskeletal: Negative.   Skin: Negative.   Neurological: Negative.    Blood pressure 112/47, pulse 65, temperature 98 F (36.7 C), temperature source Oral, resp. rate 19, height 5' 3"  (1.6 m), weight 83.008 kg (183 lb), SpO2 98 %. Physical Exam  Constitutional: She is oriented to person, place, and time. She appears well-developed and well-nourished.  HENT:  Head: Normocephalic and atraumatic.  Eyes: Conjunctivae and EOM are normal. Pupils are equal, round, and reactive to light.  Neck: Normal range of motion. Neck supple.  Cardiovascular: Normal rate, regular rhythm and normal heart sounds.   Respiratory: Effort normal and breath sounds normal.  GI: Soft. Bowel sounds are normal. She exhibits no distension. There is tenderness (RUQ). There is no rebound and no guarding.  Musculoskeletal: Normal range of motion.  Neurological: She is alert and oriented to person, place, and time.  Skin: Skin is warm and dry.    Assessment/Plan: 81 year old female with aortic stenosis, congestive heart failure, A. fib-not on any anticoagulation, with signs of acute cholecystitis  1. Would recommend medicine admission secondary to her cardiac medical issues, NPO, IV  antibiotics. 2. Recommend GI consultation secondary to elevated transaminases 3. Patient will likely require cardiac clearance prior to any definitive surgical procedures. 4. We'll follow along   Rosario Jacks., Anne Hahn 08/05/2014, 5:39 AM

## 2014-08-05 NOTE — ED Provider Notes (Signed)
CSN: [id number]   Arrival date & time 08/05/14 0209  History  This chart was scribed for  Jola Schmidt, MD by Altamease Oiler, ED Scribe. This patient was seen in room D32C/D32C and the patient's care was started at 2:35 AM.  Chief Complaint  Patient presents with  . Chest Pain  . Back Pain    HPI The history is provided by the patient. No language interpreter was used.   [name] is a 81 y.o. female with PMHx of CAD, hypercholesteremia, CKD, SVT, and aortic stenosis who presents to the Emergency Department complaining of intermittent epigastric pain with onset 2-3 days ago while at rest. No modifying factors. The epigastric pain has resolved. 5 days ago she had an episode of right-sided abdominal pain, nausea, light-headedness and clamminess. Her doctor instructed her to call EMS if the discomfort occurred again.  Also complains of generalized weakness, daily dry cough, and "heavy" left scapula pain with onset last night before bed. No fever, SOB, current nausea, or vomiting.  Her aortic stenosis has not changed in 3 years and she has no chest pain. Pt given 3 NTG by EMS.    Past Medical History  Diagnosis Date  . Coronary artery disease   . Hypercholesteremia   . Hypothyroidism   . Osteopenia   . PUD (peptic ulcer disease)   . Rheumatic fever   . Atypical chest pain   . CKD (chronic kidney disease), stage III   . SVT (supraventricular tachycardia) October 2014    converted with vagal maneuver  . Vitamin D deficiency   . Aortic stenosis     moderate by echo 06/2013  . Diastolic dysfunction   . Cancer 2006    SURGERY AND CHEMO AND RADIATION   . Breast cancer, right breast     Past Surgical History  Procedure Laterality Date  . Thyroid surgery      NODULE REMOVED  . Total hip arthroplasty Left 2003  . Replacement total knee Right 2009  . Breast surgery      RIGHT LUMPECTOMY AND REMOVAL OF LYMPH NODES  . Total hip arthroplasty Right 08/28/2013    Procedure: RIGHT TOTAL  HIP ARTHROPLASTY ANTERIOR APPROACH;  Surgeon: Mauri Pole, MD;  Location: [facility];  Service: Orthopedics;  Laterality: Right;    Family History  Problem Relation Age of Onset  . Heart disease Mother   . Heart disease Father     History  Substance Use Topics  . Smoking status: Never Smoker   . Smokeless tobacco: Never Used  . Alcohol Use: No     Review of Systems 10 Systems reviewed and all are negative for acute change except as noted in the HPI.  Home Medications   Prior to Admission medications   Medication Sig Start Date End Date Taking? Authorizing Provider  aspirin EC 81 MG tablet Take 81 mg by mouth daily.    Historical Provider, MD  Calcium Carbonate-Vitamin D (CALCIUM + D PO) Take 1 tablet by mouth daily.    Historical Provider, MD  Coenzyme Q10 (COQ-10 PO) Take 1 tablet by mouth daily.     Historical Provider, MD  Cyanocobalamin (VITAMIN B-12 PO) Take 1 tablet by mouth daily.    Historical Provider, MD  docusate sodium 100 MG CAPS Take 100 mg by mouth 2 (two) times daily. 08/29/13   Danae Orleans, PA-C  ferrous sulfate 325 (65 FE) MG tablet Take 1 tablet (325 mg total) by mouth 3 (three) times daily after  meals. 08/29/13   Danae Orleans, PA-C  fish oil-omega-3 fatty acids 1000 MG capsule Take 1 g by mouth daily.    Historical Provider, MD  fluticasone (FLONASE) 50 MCG/ACT nasal spray Place 1 spray into both nostrils daily as needed for allergies or rhinitis.    Historical Provider, MD  furosemide (LASIX) 20 MG tablet TAKE 1 TABLET BY MOUTH EVERY DAY AS NEEDED FOR FLUID 05/30/14   Sueanne Margarita, MD  ibuprofen (ADVIL,MOTRIN) 200 MG tablet Take 200 mg by mouth every 6 (six) hours as needed for moderate pain.    Historical Provider, MD  KLOR-CON M10 10 MEQ tablet TAKE 1 TABLET BY MOUTH EVERY DAY AS NEEDED FOR SWELLING (WITH LASIX) 05/30/14   Sueanne Margarita, MD  lansoprazole (PREVACID) 30 MG capsule Take 30 mg by mouth daily.    Historical Provider, MD  levothyroxine (SYNTHROID,  LEVOTHROID) 112 MCG tablet Take 112 mcg by mouth daily before breakfast.    Historical Provider, MD  magnesium gluconate (MAGONATE) 500 MG tablet Take 500 mg by mouth 2 (two) times daily.     Historical Provider, MD  Multiple Vitamin (MULTIVITAMIN WITH MINERALS) TABS tablet Take 1 tablet by mouth daily.    Historical Provider, MD  Probiotic Product (PROBIOTIC DAILY PO) Take 1 tablet by mouth daily.     Historical Provider, MD  Vitamin D, Ergocalciferol, (DRISDOL) 50000 UNITS CAPS capsule Take 50,000 Units by mouth every 7 (seven) days. Saturday    Historical Provider, MD    Allergies  Atorvastatin; Compazine; Fenofibrate; Floxin; Livalo; Pravastatin sodium; and Simvastatin  Triage Vitals: BP 98/54 mmHg  Pulse 67  Temp(Src) 98 F (36.7 C) (Oral)  Resp 20  Ht 5\' 3"  (1.6 m)  Wt 183 lb (83.008 kg)  BMI 32.43 kg/m2  SpO2 95%  Physical Exam  Constitutional: She is oriented to person, place, and time. She appears well-developed and well-nourished. No distress.  HENT:  Head: Normocephalic and atraumatic.  Eyes: EOM are normal.  Neck: Normal range of motion.  Cardiovascular: Normal rate and regular rhythm.   Murmur heard. Mild systolic ejection murmur   Pulmonary/Chest: Effort normal and breath sounds normal.  Abdominal: Soft. She exhibits no distension. There is tenderness.  Mild RUQ tenderness  Musculoskeletal: Normal range of motion.  Neurological: She is alert and oriented to person, place, and time.  Skin: Skin is warm and dry.  Psychiatric: She has a normal mood and affect. Judgment normal.  Nursing note and vitals reviewed.   ED Course  Procedures   DIAGNOSTIC STUDIES: Oxygen Saturation is 95% on 4 L [place], adequate by my interpretation.    COORDINATION OF CARE: 2:41 AM Discussed treatment plan which includes CXR, EKG, abdominal US, and lab work with pt at bedside and pt agreed to plan.  Labs Review-  Labs Reviewed  COMPREHENSIVE METABOLIC PANEL - Abnormal; Notable for  the following:    Glucose, Bld 144 (*)    Creatinine, Ser 1.01 (*)    AST 471 (*)    ALT 323 (*)    Alkaline Phosphatase 145 (*)    Total Bilirubin 3.1 (*)    GFR calc non Af Amer 51 (*)    GFR calc Af Amer 59 (*)    All other components within normal limits  BRAIN NATRIURETIC PEPTIDE  CBC WITH DIFFERENTIAL/PLATELET  LACTIC ACID, PLASMA  LIPASE, BLOOD  I-STAT TROPOININ, ED    Imaging Review Dg Chest 2 View  08/05/2014   CLINICAL DATA:  Cough, back pain  and abdomen pain  EXAM: CHEST  2 VIEW  COMPARISON:  10/21/2012  FINDINGS: There is mild interstitial coarsening. There is no airspace consolidation. There is no effusion. The pulmonary vasculature is normal.  IMPRESSION: Mild interstitial coarsening.  This is of indeterminate chronicity.   Electronically Signed   By: Andreas Newport M.D.   On: 08/05/2014 02:58   US Abdomen Limited  08/05/2014   CLINICAL DATA:  Right upper quadrant abdominal pain  EXAM: US ABDOMEN LIMITED - RIGHT UPPER QUADRANT  COMPARISON:  None.  FINDINGS: Gallbladder:  Multiple mobile calculi are present in the gallbladder lumen. There is mild gallbladder wall thickening, 4 mm. The patient was tender over the gallbladder.  Common bile duct:  Diameter: 4 mm  Liver:  No focal lesion identified. Within normal limits in parenchymal echogenicity.  IMPRESSION: Cholelithiasis. Mild gallbladder mural thickening and mild tenderness. This could represent cholecystitis.   Electronically Signed   By: Andreas Newport M.D.   On: 08/05/2014 04:13  I personally reviewed the imaging tests through PACS system I reviewed available ER/hospitalization records through the EMR   EKG Interpretation  Date/Time:  Monday August 05 2014 02:20:22 EDT Ventricular Rate:  71 PR Interval:  163 QRS Duration: 101 QT Interval:  412 QTC Calculation: 448 R Axis:   39 Text Interpretation:  Sinus rhythm Abnormal R-wave progression, early transition Confirmed by Natausha Jungwirth  MD, Jamarco Zaldivar ([id number]) on 08/05/2014  2:22:54 AM       MDM   Final diagnoses:  None   Patient started consistent with biliary colic.  Now with more constant pain over the past 12 hours.  She has mild thickening of her gallbladder concerning for possible early cholecystitis.  Patient be started on IV antibiotics at this time.  Admission.  I will speak with general surgery  I personally performed the services described in this documentation, which was scribed in my presence. The recorded information has been reviewed and is accurate.       Jola Schmidt, MD 08/05/14 [phone]

## 2014-08-06 ENCOUNTER — Encounter (HOSPITAL_COMMUNITY): Payer: Self-pay | Admitting: Certified Registered Nurse Anesthetist

## 2014-08-06 ENCOUNTER — Encounter (HOSPITAL_COMMUNITY)
Admission: EM | Disposition: A | Discharge status: Home / Self Care | Payer: Self-pay | Source: Home / Self Care | Attending: Internal Medicine

## 2014-08-06 ENCOUNTER — Inpatient Hospital Stay (HOSPITAL_COMMUNITY): Payer: Medicare Other | Admitting: Anesthesiology

## 2014-08-06 DIAGNOSIS — R740 Nonspecific elevation of levels of transaminase and lactic acid dehydrogenase [LDH]: Secondary | ICD-10-CM

## 2014-08-06 HISTORY — PX: CHOLECYSTECTOMY: SHX55

## 2014-08-06 LAB — SURGICAL PCR SCREEN
MRSA, PCR: NEGATIVE
Staphylococcus aureus: NEGATIVE

## 2014-08-06 LAB — CBC
HCT: 39.2 % (ref 36.0–46.0)
HEMOGLOBIN: 12.7 g/dL (ref 12.0–15.0)
MCH: 31.1 pg (ref 26.0–34.0)
MCHC: 32.4 g/dL (ref 30.0–36.0)
MCV: 95.8 fL (ref 78.0–100.0)
Platelets: 213 10*3/uL (ref 150–400)
RBC: 4.09 MIL/uL (ref 3.87–5.11)
RDW: 13.2 % (ref 11.5–15.5)
WBC: 3.1 10*3/uL — AB (ref 4.0–10.5)

## 2014-08-06 LAB — COMPREHENSIVE METABOLIC PANEL
ALT: 360 U/L — ABNORMAL HIGH (ref 14–54)
AST: 285 U/L — AB (ref 15–41)
Albumin: 3.1 g/dL — ABNORMAL LOW (ref 3.5–5.0)
Alkaline Phosphatase: 170 U/L — ABNORMAL HIGH (ref 38–126)
Anion gap: 11 (ref 5–15)
BUN: 9 mg/dL (ref 6–20)
CHLORIDE: 103 mmol/L (ref 101–111)
CO2: 25 mmol/L (ref 22–32)
CREATININE: 0.75 mg/dL (ref 0.44–1.00)
Calcium: 8.8 mg/dL — ABNORMAL LOW (ref 8.9–10.3)
GFR calc non Af Amer: >60 mL/min (ref >60)
GLUCOSE: 96 mg/dL (ref 65–99)
POTASSIUM: 3.8 mmol/L (ref 3.5–5.1)
SODIUM: 139 mmol/L (ref 135–145)
Total Bilirubin: 1.5 mg/dL — ABNORMAL HIGH (ref 0.3–1.2)
Total Protein: 6.2 g/dL — ABNORMAL LOW (ref 6.5–8.1)

## 2014-08-06 SURGERY — LAPAROSCOPIC CHOLECYSTECTOMY WITH INTRAOPERATIVE CHOLANGIOGRAM
Anesthesia: General | Site: Abdomen

## 2014-08-06 SURGERY — ERCP, WITH INTERVENTION IF INDICATED
Anesthesia: Moderate Sedation

## 2014-08-06 MED ORDER — SUCCINYLCHOLINE CHLORIDE 20 MG/ML IJ SOLN
INTRAMUSCULAR | Status: AC
  Filled 2014-08-06: qty 1

## 2014-08-06 MED ORDER — PHENYLEPHRINE HCL 10 MG/ML IJ SOLN
INTRAMUSCULAR | Status: DC | PRN
  Administered 2014-08-06: 40 ug via INTRAVENOUS
  Administered 2014-08-06 (×2): 80 ug via INTRAVENOUS

## 2014-08-06 MED ORDER — PIPERACILLIN-TAZOBACTAM 3.375 G IVPB
3.3750 g | Freq: Three times a day (TID) | INTRAVENOUS | Status: DC
  Administered 2014-08-06 – 2014-08-07 (×2): 3.375 g via INTRAVENOUS
  Filled 2014-08-06 (×7): qty 50

## 2014-08-06 MED ORDER — HEMOSTATIC AGENTS (NO CHARGE) OPTIME
TOPICAL | Status: DC | PRN
  Administered 2014-08-06: 1 via TOPICAL

## 2014-08-06 MED ORDER — PROPOFOL 10 MG/ML IV BOLUS
INTRAVENOUS | Status: DC | PRN
  Administered 2014-08-06: 150 mg via INTRAVENOUS

## 2014-08-06 MED ORDER — LACTATED RINGERS IV SOLN
INTRAVENOUS | Status: DC
  Administered 2014-08-06 (×2): via INTRAVENOUS

## 2014-08-06 MED ORDER — GLYCOPYRROLATE 0.2 MG/ML IJ SOLN
INTRAMUSCULAR | Status: DC | PRN
  Administered 2014-08-06: 0.6 mg via INTRAVENOUS
  Administered 2014-08-06: 0.1 mg via INTRAVENOUS

## 2014-08-06 MED ORDER — PHENYLEPHRINE 40 MCG/ML (10ML) SYRINGE FOR IV PUSH (FOR BLOOD PRESSURE SUPPORT)
PREFILLED_SYRINGE | INTRAVENOUS | Status: AC
  Filled 2014-08-06: qty 10

## 2014-08-06 MED ORDER — EPHEDRINE SULFATE 50 MG/ML IJ SOLN
INTRAMUSCULAR | Status: DC | PRN
  Administered 2014-08-06 (×3): 10 mg via INTRAVENOUS

## 2014-08-06 MED ORDER — 0.9 % SODIUM CHLORIDE (POUR BTL) OPTIME
TOPICAL | Status: DC | PRN
  Administered 2014-08-06: 1000 mL

## 2014-08-06 MED ORDER — MIDAZOLAM HCL 2 MG/2ML IJ SOLN
INTRAMUSCULAR | Status: AC
  Administered 2014-08-06: 0.5 mg via INTRAVENOUS
  Filled 2014-08-06: qty 2

## 2014-08-06 MED ORDER — BUPIVACAINE-EPINEPHRINE (PF) 0.25% -1:200000 IJ SOLN
INTRAMUSCULAR | Status: AC
  Filled 2014-08-06: qty 30

## 2014-08-06 MED ORDER — ROCURONIUM BROMIDE 50 MG/5ML IV SOLN
INTRAVENOUS | Status: AC
  Filled 2014-08-06: qty 1

## 2014-08-06 MED ORDER — HYDROCODONE-ACETAMINOPHEN 5-325 MG PO TABS
1.0000 | ORAL_TABLET | ORAL | Status: DC | PRN
  Administered 2014-08-06 – 2014-08-07 (×2): 1 via ORAL
  Filled 2014-08-06 (×2): qty 1

## 2014-08-06 MED ORDER — HYDROMORPHONE HCL 1 MG/ML IJ SOLN
INTRAMUSCULAR | Status: AC
Start: 2014-08-06 — End: 2014-08-07
  Filled 2014-08-06: qty 1

## 2014-08-06 MED ORDER — ONDANSETRON HCL 4 MG/2ML IJ SOLN
INTRAMUSCULAR | Status: AC
  Filled 2014-08-06: qty 2

## 2014-08-06 MED ORDER — ONDANSETRON HCL 4 MG/2ML IJ SOLN
INTRAMUSCULAR | Status: AC
  Administered 2014-08-06: 4 mg
  Filled 2014-08-06: qty 2

## 2014-08-06 MED ORDER — NEOSTIGMINE METHYLSULFATE 10 MG/10ML IV SOLN
INTRAVENOUS | Status: DC | PRN
  Administered 2014-08-06: 4 mg via INTRAVENOUS

## 2014-08-06 MED ORDER — PROPOFOL 10 MG/ML IV BOLUS
INTRAVENOUS | Status: AC
  Filled 2014-08-06: qty 20

## 2014-08-06 MED ORDER — ROCURONIUM BROMIDE 100 MG/10ML IV SOLN
INTRAVENOUS | Status: DC | PRN
  Administered 2014-08-06: 40 mg via INTRAVENOUS

## 2014-08-06 MED ORDER — LIDOCAINE HCL (CARDIAC) 20 MG/ML IV SOLN
INTRAVENOUS | Status: DC | PRN
  Administered 2014-08-06: 80 mg via INTRAVENOUS

## 2014-08-06 MED ORDER — EPHEDRINE SULFATE 50 MG/ML IJ SOLN
INTRAMUSCULAR | Status: AC
  Filled 2014-08-06: qty 1

## 2014-08-06 MED ORDER — HYDROMORPHONE HCL 1 MG/ML IJ SOLN
INTRAMUSCULAR | Status: AC
  Filled 2014-08-06: qty 1

## 2014-08-06 MED ORDER — NEOSTIGMINE METHYLSULFATE 10 MG/10ML IV SOLN
INTRAVENOUS | Status: AC
  Filled 2014-08-06: qty 1

## 2014-08-06 MED ORDER — HYDROMORPHONE HCL 1 MG/ML IJ SOLN
0.2500 mg | INTRAMUSCULAR | Status: DC | PRN
  Administered 2014-08-06 (×4): 0.5 mg via INTRAVENOUS

## 2014-08-06 MED ORDER — BUPIVACAINE-EPINEPHRINE 0.25% -1:200000 IJ SOLN
INTRAMUSCULAR | Status: DC | PRN
  Administered 2014-08-06: 30 mL

## 2014-08-06 MED ORDER — FENTANYL CITRATE (PF) 250 MCG/5ML IJ SOLN
INTRAMUSCULAR | Status: AC
  Filled 2014-08-06: qty 5

## 2014-08-06 MED ORDER — STERILE WATER FOR INJECTION IJ SOLN
INTRAMUSCULAR | Status: AC
  Filled 2014-08-06: qty 10

## 2014-08-06 MED ORDER — LIDOCAINE HCL (CARDIAC) 20 MG/ML IV SOLN
INTRAVENOUS | Status: AC
  Filled 2014-08-06: qty 5

## 2014-08-06 MED ORDER — MIDAZOLAM HCL 2 MG/2ML IJ SOLN
INTRAMUSCULAR | Status: AC
  Filled 2014-08-06: qty 4

## 2014-08-06 MED ORDER — FENTANYL CITRATE (PF) 100 MCG/2ML IJ SOLN
INTRAMUSCULAR | Status: DC | PRN
Start: 2014-08-06 — End: 2014-08-06
  Administered 2014-08-06 (×2): 100 ug via INTRAVENOUS
  Administered 2014-08-06: 50 ug via INTRAVENOUS

## 2014-08-06 MED ORDER — DEXAMETHASONE SODIUM PHOSPHATE 4 MG/ML IJ SOLN
INTRAMUSCULAR | Status: DC | PRN
  Administered 2014-08-06: 4 mg via INTRAVENOUS

## 2014-08-06 MED ORDER — GLYCOPYRROLATE 0.2 MG/ML IJ SOLN
INTRAMUSCULAR | Status: AC
  Filled 2014-08-06: qty 3

## 2014-08-06 MED ORDER — SODIUM CHLORIDE 0.9 % IV SOLN
INTRAVENOUS | Status: DC | PRN
  Administered 2014-08-06: 13:00:00

## 2014-08-06 MED ORDER — SODIUM CHLORIDE 0.9 % IR SOLN
Status: DC | PRN
  Administered 2014-08-06: 1000 mL

## 2014-08-06 MED ORDER — SUCCINYLCHOLINE CHLORIDE 20 MG/ML IJ SOLN
INTRAMUSCULAR | Status: DC | PRN
  Administered 2014-08-06: 140 mg via INTRAVENOUS

## 2014-08-06 SURGICAL SUPPLY — 57 items
APPLIER CLIP 5 13 M/L LIGAMAX5 (MISCELLANEOUS) ×3
APPLIER CLIP ROT 10 11.4 M/L (STAPLE) ×3
APR CLP MED LRG 11.4X10 (STAPLE) ×1
APR CLP MED LRG 5 ANG JAW (MISCELLANEOUS) ×1
BAG SPEC RTRVL 10 TROC 200 (ENDOMECHANICALS) ×1
BLADE SURG ROTATE 9660 (MISCELLANEOUS) IMPLANT
CANISTER SUCTION 2500CC (MISCELLANEOUS) ×3 IMPLANT
CHLORAPREP W/TINT 26ML (MISCELLANEOUS) ×3 IMPLANT
CLIP APPLIE 5 13 M/L LIGAMAX5 (MISCELLANEOUS) ×1 IMPLANT
CLIP APPLIE ROT 10 11.4 M/L (STAPLE) IMPLANT
CLOSURE WOUND 1/2 X4 (GAUZE/BANDAGES/DRESSINGS)
COVER MAYO STAND STRL (DRAPES) ×3 IMPLANT
COVER SURGICAL LIGHT HANDLE (MISCELLANEOUS) ×3 IMPLANT
CUTTER LINEAR ENDO 35 ART THIN (STAPLE) ×2 IMPLANT
DEVICE TROCAR PUNCTURE CLOSURE (ENDOMECHANICALS) IMPLANT
DRAIN CHANNEL 19F RND (DRAIN) ×2 IMPLANT
DRAPE C-ARM 42X72 X-RAY (DRAPES) ×3 IMPLANT
ELECT REM PT RETURN 9FT ADLT (ELECTROSURGICAL) ×3
ELECTRODE REM PT RTRN 9FT ADLT (ELECTROSURGICAL) ×1 IMPLANT
ENDOLOOP SUT PDS II  0 18 (SUTURE) ×2
ENDOLOOP SUT PDS II 0 18 (SUTURE) IMPLANT
EVACUATOR SILICONE 100CC (DRAIN) ×2 IMPLANT
GLOVE BIO SURGEON STRL SZ7 (GLOVE) ×5 IMPLANT
GLOVE BIOGEL PI IND STRL 7.0 (GLOVE) IMPLANT
GLOVE BIOGEL PI IND STRL 7.5 (GLOVE) ×1 IMPLANT
GLOVE BIOGEL PI INDICATOR 7.0 (GLOVE) ×2
GLOVE BIOGEL PI INDICATOR 7.5 (GLOVE) ×4
GLOVE ECLIPSE 7.5 STRL STRAW (GLOVE) ×2 IMPLANT
GOWN STRL REUS W/ TWL LRG LVL3 (GOWN DISPOSABLE) ×3 IMPLANT
GOWN STRL REUS W/TWL LRG LVL3 (GOWN DISPOSABLE) ×9
HEMOSTAT SNOW SURGICEL 2X4 (HEMOSTASIS) ×2 IMPLANT
KIT BASIN OR (CUSTOM PROCEDURE TRAY) ×3 IMPLANT
KIT ROOM TURNOVER OR (KITS) ×3 IMPLANT
LIQUID BAND (GAUZE/BANDAGES/DRESSINGS) ×3 IMPLANT
NS IRRIG 1000ML POUR BTL (IV SOLUTION) ×3 IMPLANT
PAD ARMBOARD 7.5X6 YLW CONV (MISCELLANEOUS) ×3 IMPLANT
POUCH RETRIEVAL ECOSAC 10 (ENDOMECHANICALS) ×1 IMPLANT
POUCH RETRIEVAL ECOSAC 10MM (ENDOMECHANICALS) ×2
SCISSORS LAP 5X35 DISP (ENDOMECHANICALS) ×3 IMPLANT
SET CHOLANGIOGRAPH 5 50 .035 (SET/KITS/TRAYS/PACK) ×3 IMPLANT
SET IRRIG TUBING LAPAROSCOPIC (IRRIGATION / IRRIGATOR) ×3 IMPLANT
SLEEVE ENDOPATH XCEL 5M (ENDOMECHANICALS) ×6 IMPLANT
SPECIMEN JAR SMALL (MISCELLANEOUS) ×3 IMPLANT
STRIP CLOSURE SKIN 1/2X4 (GAUZE/BANDAGES/DRESSINGS) IMPLANT
SUT ETHILON 2 0 FS 18 (SUTURE) ×2 IMPLANT
SUT MNCRL AB 4-0 PS2 18 (SUTURE) ×3 IMPLANT
SUT VIC AB 0 CT1 27 (SUTURE) ×9
SUT VIC AB 0 CT1 27XBRD ANBCTR (SUTURE) IMPLANT
SUT VICRYL 0 UR6 27IN ABS (SUTURE) IMPLANT
TOWEL OR 17X24 6PK STRL BLUE (TOWEL DISPOSABLE) ×3 IMPLANT
TOWEL OR 17X26 10 PK STRL BLUE (TOWEL DISPOSABLE) ×3 IMPLANT
TRAY LAPAROSCOPIC MC (CUSTOM PROCEDURE TRAY) ×3 IMPLANT
TROCAR BLADELESS 11MM (ENDOMECHANICALS) ×2 IMPLANT
TROCAR XCEL 12X100 BLDLESS (ENDOMECHANICALS) ×2 IMPLANT
TROCAR XCEL BLUNT TIP 100MML (ENDOMECHANICALS) ×3 IMPLANT
TROCAR XCEL NON-BLD 5MMX100MML (ENDOMECHANICALS) ×3 IMPLANT
TUBING INSUFFLATION (TUBING) ×3 IMPLANT

## 2014-08-06 NOTE — Transfer of Care (Signed)
Immediate Anesthesia Transfer of Care Note  Patient: [name]  Procedure(s) Performed: Procedure(s): LAPAROSCOPIC CHOLECYSTECTOMY  (N/A)  Patient Location: PACU  Anesthesia Type:General  Level of Consciousness: awake, oriented and patient cooperative  Airway & Oxygen Therapy: Patient Spontanous Breathing and Patient connected to face mask oxygen  Post-op Assessment: Report given to RN, Post -op Vital signs reviewed and stable, Patient moving all extremities and Patient able to stick tongue midline  Post vital signs: Reviewed and stable  Last Vitals:  Filed Vitals:   08/06/14 1217  BP:   Pulse: 64  Temp:   Resp: 17    Complications: No apparent anesthesia complications

## 2014-08-06 NOTE — Progress Notes (Signed)
[facility] PROGRESS NOTE  [name] GGY:[id number] DOB: 04/19/1931 DOA: 08/05/2014 PCP: Simona Huh, MD  Assessment/Plan: 1. Acute cholecystitis/choledocholithiasis -clinically improved without pain/N/V, suspect passe stone -change rocephin to zosyn -plan for Lap chole with IoC today -GI and CCS following  2. Moderate AS and Chronic diastolic CHF -compensated, caution with excess volume -hold lasix, cleared by Cards  3. PUD -continue PPI  4. hypothyroidism -continue synthroid  DVt proph: hold Hep SQ  Code Status:Full Code Family Communication: none at bedside Disposition Plan: home pending procedures   Consultants:  GI  CCS  Antibiotics: Zosyn  HPI/Subjective: Feels better, no pain/N/V  Objective: Filed Vitals:   08/06/14 1126  BP: 144/57  Pulse: 56  Temp: 98 F (36.7 C)  Resp: 16    Intake/Output Summary (Last 24 hours) at 08/06/14 1133 Last data filed at 08/06/14 0648  Gross per 24 hour  Intake 1688.75 ml  Output      0 ml  Net 1688.75 ml   Filed Weights   08/05/14 0227  Weight: 83.008 kg (183 lb)    Exam:   General:  AAox3  Cardiovascular: S1S2/RRR  Respiratory:CTAB  Abdomen: soft, NT, BS present, no distress  Musculoskeletal: no edema c/c   Data Reviewed: Basic Metabolic Panel:  Recent Labs Lab 08/05/14 0305 08/06/14 0305  NA 137 139  K 4.0 3.8  CL 101 103  CO2 27 25  GLUCOSE 144* 96  BUN 16 9  CREATININE 1.01* 0.75  CALCIUM 9.4 8.8*   Liver Function Tests:  Recent Labs Lab 08/05/14 0305 08/06/14 0305  AST 471* 285*  ALT 323* 360*  ALKPHOS 145* 170*  BILITOT 3.1* 1.5*  PROT 7.0 6.2*  ALBUMIN 3.6 3.1*    Recent Labs Lab 08/05/14 0305  LIPASE 28   No results for input(s): AMMONIA in the last 168 hours. CBC:  Recent Labs Lab 08/05/14 0305 08/06/14 0305  WBC 6.4 3.1*  NEUTROABS 4.8  --   HGB 14.0 12.7  HCT 41.8 39.2  MCV 95.2 95.8  PLT 242 213   Cardiac Enzymes: No results  for input(s): CKTOTAL, CKMB, CKMBINDEX, TROPONINI in the last 168 hours. BNP (last 3 results)  Recent Labs  08/05/14 0305  BNP 54.1    ProBNP (last 3 results)  Recent Labs  05/14/14 0828  PROBNP 90.0    CBG: No results for input(s): GLUCAP in the last 168 hours.  Recent Results (from the past 240 hour(s))  Surgical pcr screen     Status: None   Collection Time: 08/06/14  5:13 AM  Result Value Ref Range Status   MRSA, PCR NEGATIVE NEGATIVE Final   Staphylococcus aureus NEGATIVE NEGATIVE Final    Comment:        The Xpert SA Assay (FDA approved for NASAL specimens in patients over 82 years of age), is one component of a comprehensive surveillance program.  Test performance has been validated by [facility] for patients greater than or equal to 46 year old. It is not intended to diagnose infection nor to guide or monitor treatment.      Studies: Dg Chest 2 View  08/05/2014   CLINICAL DATA:  Cough, back pain and abdomen pain  EXAM: CHEST  2 VIEW  COMPARISON:  10/21/2012  FINDINGS: There is mild interstitial coarsening. There is no airspace consolidation. There is no effusion. The pulmonary vasculature is normal.  IMPRESSION: Mild interstitial coarsening.  This is of indeterminate chronicity.   Electronically Signed   By: Quillian Quince  Armandina Stammer M.D.   On: 08/05/2014 02:58   Mr 3d Recon At Scanner  08/05/2014   CLINICAL DATA:  81 year old female inpatient with intermittent abdominal pain and nausea for 5 days. Cholelithiasis, gallbladder wall thickening and tenderness on abdominal sonogram from suggestive of acute cholecystitis.  EXAM: MRI ABDOMEN WITHOUT AND WITH CONTRAST (INCLUDING MRCP)  TECHNIQUE: Multiplanar multisequence MR imaging of the abdomen was performed both before and after the administration of intravenous contrast. Heavily T2-weighted images of the biliary and pancreatic ducts were obtained, and three-dimensional MRCP images were rendered by post processing.   CONTRAST:  74mL MULTIHANCE GADOBENATE DIMEGLUMINE 529 MG/ML IV SOLN  COMPARISON:  CT abdomen/pelvis from 06/09/2004. Abdominal sonogram from earlier today.  FINDINGS: Lower chest:  Clear lung bases.  Hepatobiliary: Normal liver size mild diffuse hepatic steatosis. No liver masses. Mildly distended gallbladder contains numerous subcentimeter gallstones, including gallstones within the gallbladder neck. There is moderate diffuse gallbladder wall thickening, gallbladder wall mucosal hyperenhancement and trace pericholecystic fluid. No intrahepatic biliary ductal dilatation. Normal common bile duct diameter of 5 mm. On the coronal T2 weighted and MRCP images, there is 2 mm filling defect within the distal common bile duct, suggestive of a tiny nonobstructing choledocholith.  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 Otherwise normal pancreas, with no main pancreatic duct dilation and no evidence of pancreas divisum.  Spleen: Normal.  Adrenals/Urinary Tract: Normal adrenals. Normal kidneys, with no hydronephrosis. No renal lesions.  Stomach/Bowel: Collapsed and grossly normal stomach. Normal caliber visualized small and large bowel.  Vascular/Lymphatic: Mild ectasia of the infrarenal abdominal aorta, maximum diameter 2.3 cm. No abdominal lymphadenopathy. Portal, splenic and hepatic veins are patent.  Other: No ascites or focal fluid collection.  Musculoskeletal: No suspicious focal osseous lesions.  IMPRESSION: 1. Mildly distended gallbladder with cholelithiasis. Moderate diffuse gallbladder wall thickening. Gallbladder wall mucosal hyperenhancement. Trace pericholecystic fluid. MRI findings suggest acute calculous cholecystitis. 2. No intrahepatic or extrahepatic biliary ductal dilatation. Suggestion of a tiny 2 mm nonobstructing choledocholith in the distal common bile duct. 3. 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 No pancreatic duct dilation. 4. Ectatic infrarenal abdominal aorta, 2.3 cm diameter. 5. Mild diffuse hepatic steatosis.  These results were called by telephone at the time of interpretation on 08/05/2014 at 5:16 pm to Dr. Clementeen Graham, who verbally acknowledged these results.   Electronically Signed   By: Ilona Sorrel M.D.   On: 08/05/2014 17:17   US Abdomen Limited  08/05/2014   CLINICAL DATA:  Right upper quadrant abdominal pain  EXAM: US ABDOMEN LIMITED - RIGHT UPPER QUADRANT  COMPARISON:  None.  FINDINGS: Gallbladder:  Multiple mobile calculi are present in the gallbladder lumen. There is mild gallbladder wall thickening, 4 mm. The patient was tender over the gallbladder.  Common bile duct:  Diameter: 4 mm  Liver:  No focal lesion identified. Within normal limits in parenchymal echogenicity.  IMPRESSION: Cholelithiasis. Mild gallbladder mural thickening and mild tenderness. This could represent cholecystitis.   Electronically Signed   By: Andreas Newport M.D.   On: 08/05/2014 04:13   Mr Jeananne Rama W/wo Cm/mrcp  08/05/2014   CLINICAL DATA:  81 year old female inpatient with intermittent abdominal pain and nausea for 5 days. Cholelithiasis, gallbladder wall thickening and tenderness on abdominal sonogram from suggestive of acute cholecystitis.  EXAM: MRI ABDOMEN WITHOUT AND WITH CONTRAST (INCLUDING MRCP)  TECHNIQUE: Multiplanar multisequence MR imaging  of the abdomen was performed both before and after the administration of intravenous contrast. Heavily T2-weighted images of the biliary and pancreatic ducts were obtained, and three-dimensional MRCP images were rendered by post processing.  CONTRAST:  38mL MULTIHANCE GADOBENATE DIMEGLUMINE 529 MG/ML IV SOLN  COMPARISON:  CT abdomen/pelvis from 06/09/2004. Abdominal sonogram from earlier today.  FINDINGS: Lower chest:  Clear lung bases.  Hepatobiliary: Normal liver size mild diffuse hepatic steatosis. No liver masses. Mildly distended gallbladder contains  numerous subcentimeter gallstones, including gallstones within the gallbladder neck. There is moderate diffuse gallbladder wall thickening, gallbladder wall mucosal hyperenhancement and trace pericholecystic fluid. No intrahepatic biliary ductal dilatation. Normal common bile duct diameter of 5 mm. On the coronal T2 weighted and MRCP images, there is 2 mm filling defect within the distal common bile duct, suggestive of a tiny nonobstructing choledocholith.  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 Otherwise normal pancreas, with no main pancreatic duct dilation and no evidence of pancreas divisum.  Spleen: Normal.  Adrenals/Urinary Tract: Normal adrenals. Normal kidneys, with no hydronephrosis. No renal lesions.  Stomach/Bowel: Collapsed and grossly normal stomach. Normal caliber visualized small and large bowel.  Vascular/Lymphatic: Mild ectasia of the infrarenal abdominal aorta, maximum diameter 2.3 cm. No abdominal lymphadenopathy. Portal, splenic and hepatic veins are patent.  Other: No ascites or focal fluid collection.  Musculoskeletal: No suspicious focal osseous lesions.  IMPRESSION: 1. Mildly distended gallbladder with cholelithiasis. Moderate diffuse gallbladder wall thickening. Gallbladder wall mucosal hyperenhancement. Trace pericholecystic fluid. MRI findings suggest acute calculous cholecystitis. 2. No intrahepatic or extrahepatic biliary ductal dilatation. Suggestion of a tiny 2 mm nonobstructing choledocholith in the distal common bile duct. 3. 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 No pancreatic duct dilation. 4. Ectatic infrarenal abdominal aorta, 2.3 cm diameter. 5. Mild diffuse hepatic steatosis.  These results were called by telephone at the time of interpretation on 08/05/2014 at 5:16 pm to Dr. Clementeen Graham, who verbally acknowledged these results.   Electronically Signed   By: Ilona Sorrel M.D.   On: 08/05/2014 17:17    Scheduled Meds: . [MAR Hold] aspirin EC  81 mg Oral Daily  . [MAR Hold] heparin  5,000 Units Subcutaneous 3 times per day  . [MAR Hold] levothyroxine  112 mcg Oral QAC breakfast  . [MAR Hold]  morphine injection  4 mg Intravenous Once  . [MAR Hold] pantoprazole (PROTONIX) IV  40 mg Intravenous Q24H  . [MAR Hold] piperacillin-tazobactam (ZOSYN)  IV  3.375 g Intravenous 3 times per day   Continuous Infusions: . sodium chloride    . dextrose 5 % and 0.9% NaCl 75 mL/hr at 08/05/14 1303   Antibiotics Given (last 72 hours)    Date/Time Action Medication Dose Rate   08/05/14 1046 Given   cefTRIAXone (ROCEPHIN) 2 g in dextrose 5 % 50 mL IVPB - Premix 2 g 100 mL/hr      Principal Problem:   Cholecystitis Active Problems:   Aortic stenosis   Chronic diastolic heart failure   Hypotension   Breast cancer, right breast   PUD (peptic ulcer disease)   Hypothyroidism   Hypercholesteremia   Abnormal transaminases    Time spent: 103min    Janesa Dockery  [facility] Pager [phone]. If 7PM-7AM, please contact night-coverage at [email or web address], password [facility] 08/06/2014, 11:33 AM  LOS: 1 day

## 2014-08-06 NOTE — Op Note (Signed)
Preoperative diagnosis: choledocholithiasis Postoperative diagnosis: same as above, chronic on acute cholecystitis Procedure: laparoscopic cholecystectomy  Surgeon: Dr Serita Grammes Asst: Dr Georganna Skeans Anesthesia: general EBL: minimal Drains none Specimen gb and contents to pathology Complications: none Sponge count correct at completion Disposition to recovery stable  Indications: This is an 35 yof who appears to have passed a stone and has [place] c/w cholelithiasis. We discussed lap chole with possible cholangiogram.   Procedure: After informed consent was obtained the patient was taken to the operating room. She was already given antibiotics. Sequential compression devices were on her legs. She was placed under general anesthesia without complication. Her abdomen was prepped and draped in the standard sterile surgical fashion. A surgical timeout was then performed.  I infiltrated marcaine below the umbilicus. I made an incision and then entered the fascia sharply. I then entered the peritoneum bluntly. I placed a 0 vicryl pursestring suture and inserted a hasson trocar. I then inserted 3 further 5 mm trocars in the epigastrium and ruq. The gallbladder wall was very thick and this was difficult to do. Her duodenum was adherent to the gallbladder and I cut this away with scissors.  I then was able to retract the gallbladder cephalad and lateral.  Eventually I was able to identify the cystic duct and clearly had the critical view of safety.The cystic duct was wide and short. I did not do a cholangiogram due to this.   I first clipped the artery and divided this.  I was clearly on the cystic duct and could visualized the CBD.Clips would not traverse it well and due to inflammation slipped off. I elected to use the gia stapler to divided this. I did enter the gallbladder and spilled stones. I did size the epigastric trocar up to a 12 mm and evacuated stones.  I then removed the gallbladder  from the liver bed with some difficulty and placed it in a bag. It was then removed from the umbilical incision. I then obtained hemostasis and irrigated. I then removed the umbilical trocar and closed with 0 vicryl and the endoclose device after tying down the pursestring.  I then desufflated the abdomen and removed all my remaining trocars. I then close these with 4-0 Monocryl and Dermabond. She tolerated this well was extubated and transferred to the recovery room in stable condition

## 2014-08-06 NOTE — OR Nursing (Signed)
While inserting foley, pt's vaginal area noted to be red and angry looking with thinning areas on both sides

## 2014-08-06 NOTE — Progress Notes (Signed)
ANTIBIOTIC CONSULT NOTE - INITIAL  Pharmacy Consult:  Zosyn Indication:  Intra-abdominal infection  Allergies  Allergen Reactions  . Atorvastatin     Myalgias 10/2011  . Compazine [Prochlorperazine]     unknown  . Fenofibrate     Rash 09/2011  . Floxin [Ofloxacin]     unknown  . Livalo [Pitavastatin] Other (See Comments)    Leg pain  . Pravastatin Sodium     mylgias 09/2011  . Simvastatin     Elevated lft's 06/2011    Patient Measurements: Height: 5\' 3"  (160 cm) Weight: 183 lb (83.008 kg) IBW/kg (Calculated) : 52.4  Vital Signs: Temp: 97.9 F (36.6 C) (08/02 0546) Temp Source: Oral (08/02 0546) BP: 118/60 mmHg (08/02 0546) Pulse Rate: 56 (08/02 0546) Intake/Output from previous day: 08/01 0701 - 08/02 0700 In: 1688.8 [P.O.:240; I.V.:1448.8] Out: -  Intake/Output from this shift:    Labs:  Recent Labs  08/05/14 0305 08/06/14 0305  WBC 6.4 3.1*  HGB 14.0 12.7  PLT 242 213  CREATININE 1.01* 0.75   Estimated Creatinine Clearance: 57.2 mL/min (by C-G formula based on Cr of 0.75). No results for input(s): VANCOTROUGH, VANCOPEAK, VANCORANDOM, GENTTROUGH, GENTPEAK, GENTRANDOM, TOBRATROUGH, TOBRAPEAK, TOBRARND, AMIKACINPEAK, AMIKACINTROU, AMIKACIN in the last 72 hours.   Microbiology: No results found for this or any previous visit (from the past 720 hour(s)).  Medical History: Past Medical History  Diagnosis Date  . Coronary artery disease   . Hypercholesteremia   . Hypothyroidism   . Osteopenia   . PUD (peptic ulcer disease)   . Rheumatic fever   . Atypical chest pain   . CKD (chronic kidney disease), stage III   . SVT (supraventricular tachycardia) October 2014    converted with vagal maneuver  . Vitamin D deficiency   . Aortic stenosis     moderate by echo 06/2013  . Diastolic dysfunction   . Cancer 2006    SURGERY AND CHEMO AND RADIATION   . Breast cancer, right breast      Assessment: 33 YOF presented with chest pain and abdominal pain.  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  Pharmacy consulted to broaden antibiotic to Zosyn for intra-abdominal infection.  Patient's renal function is improving.  Surgical decision pending.  Zosyn 8/2 >> CTX 8/1 >> 8/2   Goal of Therapy:  Resolution of infection   Plan:  - Zosyn 3.375gm IV Q8H, 4 hr infusion - Pharmacy will sign off as dosage adjustment is likely unnecessary.  Thank you for the consult!    Kassey Laforest D. Mina Marble, PharmD, BCPS Pager:  [phone] 08/06/2014, 8:33 AM

## 2014-08-06 NOTE — Anesthesia Preprocedure Evaluation (Addendum)
Anesthesia Evaluation  Patient identified by MRN, date of birth, ID band Patient awake    Reviewed: Allergy & Precautions, NPO status , Patient's Chart, lab work & pertinent test results  History of Anesthesia Complications Negative for: history of anesthetic complications  Airway Mallampati: II       Dental  (+) Teeth Intact   Pulmonary shortness of breath and with exertion,  breath sounds clear to auscultation        Cardiovascular + CAD + Valvular Problems/Murmurs AS Rhythm:Regular Rate:Normal + Systolic murmurs moderate AS by ECHO   Neuro/Psych negative neurological ROS     GI/Hepatic PUD,   Endo/Other  Hypothyroidism   Renal/GU Renal InsufficiencyRenal disease     Musculoskeletal   Abdominal   Peds  Hematology   Anesthesia Other Findings   Reproductive/Obstetrics                             Lab Results  Component Value Date   WBC 3.1* 08/06/2014   HGB 12.7 08/06/2014   HCT 39.2 08/06/2014   MCV 95.8 08/06/2014   PLT 213 08/06/2014   Lab Results  Component Value Date   CREATININE 0.75 08/06/2014   BUN 9 08/06/2014   NA 139 08/06/2014   K 3.8 08/06/2014   CL 103 08/06/2014   CO2 25 08/06/2014   Lab Results  Component Value Date   INR 1.01 08/21/2013   INR 0.98 07/20/2009   INR 1.0 01/12/2007   Echo 06/20/14  Study Conclusions  - Left ventricle: The cavity size was normal. There was mild focal   basal hypertrophy of the septum. Systolic function was normal.   The estimated ejection fraction was in the range of 55% to 60%.   Wall motion was normal; there were no regional wall motion   abnormalities. Features are consistent with a pseudonormal left   ventricular filling pattern, with concomitant abnormal relaxation   and increased filling pressure (grade 2 diastolic dysfunction).   Doppler parameters are consistent with high ventricular filling   pressure. - Aortic  valve: Valve mobility was restricted. There was moderate   stenosis. There was mild regurgitation. Mean gradient (S): 31 mm   Hg. Peak gradient (S): 53 mm Hg. - Mitral valve: Calcified annulus. Mildly thickened leaflets .   There was mild to moderate regurgitation. - Left atrium: The atrium was moderately dilated.    Anesthesia Physical Anesthesia Plan  ASA: III  Anesthesia Plan: General   Post-op Pain Management:    Induction: Intravenous  Airway Management Planned: Oral ETT  Additional Equipment: Arterial line  Intra-op Plan:   Post-operative Plan: Extubation in OR  Informed Consent: I have reviewed the patients History and Physical, chart, labs and discussed the procedure including the risks, benefits and alternatives for the proposed anesthesia with the patient or authorized representative who has indicated his/her understanding and acceptance.   Dental advisory given  Plan Discussed with: CRNA and Surgeon  Anesthesia Plan Comments:         Anesthesia Quick Evaluation

## 2014-08-06 NOTE — Care Management (Signed)
UR completed 

## 2014-08-06 NOTE — Anesthesia Procedure Notes (Signed)
Procedure Name: Intubation Performed by: Judeth Cornfield T Pre-anesthesia Checklist: Patient identified, Emergency Drugs available, Suction available, Patient being monitored and Timeout performed Patient Re-evaluated:Patient Re-evaluated prior to inductionOxygen Delivery Method: Circle system utilized Preoxygenation: Pre-oxygenation with 100% oxygen Intubation Type: IV induction Ventilation: Mask ventilation without difficulty Laryngoscope Size: Mac and 3 Grade View: Grade I Tube type: Oral Tube size: 7.5 mm Number of attempts: 1 Airway Equipment and Method: Stylet Placement Confirmation: ETT inserted through vocal cords under direct vision,  breath sounds checked- equal and bilateral and positive ETCO2 Secured at: 22 cm Tube secured with: Tape Dental Injury: Teeth and Oropharynx as per pre-operative assessment

## 2014-08-06 NOTE — Progress Notes (Signed)
Patient ID: [name], female   DOB: 1931/12/29, 81 y.o.   MRN: [id number]     [facility]      [address] [place], [place] [address]    Phone: [phone] FAX: [phone]     Subjective: Denies pain. Normal white count.  LFTs down with T bili from 3.5 to 1.5.   Afebrile.    Objective:  Vital signs:  Filed Vitals:   08/05/14 0650 08/05/14 1353 08/05/14 2103 08/06/14 0546  BP: 115/51 121/46 124/51 118/60  Pulse: 71 58 62 56  Temp:  97.9 F (36.6 C) 97.9 F (36.6 C) 97.9 F (36.6 C)  TempSrc:  Oral Oral Oral  Resp: 16 16 16 16   Height:      Weight:      SpO2: 99% 95% 97% 95%    Last BM Date: 08/04/14  Intake/Output   Yesterday:  08/01 0701 - 08/02 0700 In: 1688.8 [P.O.:240; I.V.:1448.8] Out: -  This shift: I/O last 3 completed shifts: In: 1688.8 [P.O.:240; I.V.:1448.8] Out: -     Physical Exam: General: Pt awake/alert/oriented x4 in no acute distress Abdomen: Soft.  Nondistended. Mild tenderness to RUQ.  No evidence of peritonitis.  No incarcerated hernias.    Problem List:   Principal Problem:   Cholecystitis Active Problems:   Aortic stenosis   Chronic diastolic heart failure   Hypotension   Breast cancer, right breast   PUD (peptic ulcer disease)   Hypothyroidism   Hypercholesteremia   Abnormal transaminases    Results:   Labs: Results for orders placed or performed during the hospital encounter of 08/05/14 (from the past 48 hour(s))  Brain natriuretic peptide     Status: None   Collection Time: 08/05/14  3:05 AM  Result Value Ref Range   B Natriuretic Peptide 54.1 0.0 - 100.0 pg/mL  CBC with Differential     Status: None   Collection Time: 08/05/14  3:05 AM  Result Value Ref Range   WBC 6.4 4.0 - 10.5 K/uL   RBC 4.39 3.87 - 5.11 MIL/uL   Hemoglobin 14.0 12.0 - 15.0 g/dL   HCT 41.8 36.0 - 46.0 %   MCV 95.2 78.0 - 100.0 fL   MCH 31.9 26.0 - 34.0 pg   MCHC 33.5 30.0 - 36.0 g/dL    RDW 12.9 11.5 - 15.5 %   Platelets 242 150 - 400 K/uL   Neutrophils Relative % 75 43 - 77 %   Neutro Abs 4.8 1.7 - 7.7 K/uL   Lymphocytes Relative 13 12 - 46 %   Lymphs Abs 0.8 0.7 - 4.0 K/uL   Monocytes Relative 7 3 - 12 %   Monocytes Absolute 0.5 0.1 - 1.0 K/uL   Eosinophils Relative 4 0 - 5 %   Eosinophils Absolute 0.3 0.0 - 0.7 K/uL   Basophils Relative 1 0 - 1 %   Basophils Absolute 0.0 0.0 - 0.1 K/uL  Comprehensive metabolic panel     Status: Abnormal   Collection Time: 08/05/14  3:05 AM  Result Value Ref Range   Sodium 137 135 - 145 mmol/L   Potassium 4.0 3.5 - 5.1 mmol/L   Chloride 101 101 - 111 mmol/L   CO2 27 22 - 32 mmol/L   Glucose, Bld 144 (H) 65 - 99 mg/dL   BUN 16 6 - 20 mg/dL   Creatinine, Ser 1.01 (H) 0.44 - 1.00 mg/dL   Calcium 9.4 8.9 - 10.3 mg/dL  Total Protein 7.0 6.5 - 8.1 g/dL   Albumin 3.6 3.5 - 5.0 g/dL   AST 471 (H) 15 - 41 U/L   ALT 323 (H) 14 - 54 U/L   Alkaline Phosphatase 145 (H) 38 - 126 U/L   Total Bilirubin 3.1 (H) 0.3 - 1.2 mg/dL   GFR calc non Af Amer 51 (L) >60 mL/min   GFR calc Af Amer 59 (L) >60 mL/min    Comment: (NOTE) The eGFR has been calculated using the CKD EPI equation. This calculation has not been validated in all clinical situations. eGFR's persistently <60 mL/min signify possible Chronic Kidney Disease.    Anion gap 9 5 - 15  Lactic acid, plasma     Status: None   Collection Time: 08/05/14  3:05 AM  Result Value Ref Range   Lactic Acid, Venous 1.6 0.5 - 2.0 mmol/L  Lipase, blood     Status: None   Collection Time: 08/05/14  3:05 AM  Result Value Ref Range   Lipase 28 22 - 51 U/L  I-stat troponin, ED     Status: None   Collection Time: 08/05/14  3:08 AM  Result Value Ref Range   Troponin i, poc 0.00 0.00 - 0.08 ng/mL   Comment 3            Comment: Due to the release kinetics of cTnI, a negative result within the first hours of the onset of symptoms does not rule out myocardial infarction with certainty. If  myocardial infarction is still suspected, repeat the test at appropriate intervals.   Comprehensive metabolic panel     Status: Abnormal   Collection Time: 08/06/14  3:05 AM  Result Value Ref Range   Sodium 139 135 - 145 mmol/L   Potassium 3.8 3.5 - 5.1 mmol/L   Chloride 103 101 - 111 mmol/L   CO2 25 22 - 32 mmol/L   Glucose, Bld 96 65 - 99 mg/dL   BUN 9 6 - 20 mg/dL   Creatinine, Ser 0.75 0.44 - 1.00 mg/dL   Calcium 8.8 (L) 8.9 - 10.3 mg/dL   Total Protein 6.2 (L) 6.5 - 8.1 g/dL   Albumin 3.1 (L) 3.5 - 5.0 g/dL   AST 285 (H) 15 - 41 U/L   ALT 360 (H) 14 - 54 U/L   Alkaline Phosphatase 170 (H) 38 - 126 U/L   Total Bilirubin 1.5 (H) 0.3 - 1.2 mg/dL   GFR calc non Af Amer >60 >60 mL/min   GFR calc Af Amer >60 >60 mL/min    Comment: (NOTE) The eGFR has been calculated using the CKD EPI equation. This calculation has not been validated in all clinical situations. eGFR's persistently <60 mL/min signify possible Chronic Kidney Disease.    Anion gap 11 5 - 15  CBC     Status: Abnormal   Collection Time: 08/06/14  3:05 AM  Result Value Ref Range   WBC 3.1 (L) 4.0 - 10.5 K/uL   RBC 4.09 3.87 - 5.11 MIL/uL   Hemoglobin 12.7 12.0 - 15.0 g/dL   HCT 39.2 36.0 - 46.0 %   MCV 95.8 78.0 - 100.0 fL   MCH 31.1 26.0 - 34.0 pg   MCHC 32.4 30.0 - 36.0 g/dL   RDW 13.2 11.5 - 15.5 %   Platelets 213 150 - 400 K/uL    Imaging / Studies: Dg Chest 2 View  08/05/2014   CLINICAL DATA:  Cough, back pain and abdomen pain  EXAM: CHEST  2 VIEW  COMPARISON:  10/21/2012  FINDINGS: There is mild interstitial coarsening. There is no airspace consolidation. There is no effusion. The pulmonary vasculature is normal.  IMPRESSION: Mild interstitial coarsening.  This is of indeterminate chronicity.   Electronically Signed   By: Andreas Newport M.D.   On: 08/05/2014 02:58   Mr 3d Recon At Scanner  08/05/2014   CLINICAL DATA:  81 year old female inpatient with intermittent abdominal pain and nausea for 5 days.  Cholelithiasis, gallbladder wall thickening and tenderness on abdominal sonogram from suggestive of acute cholecystitis.  EXAM: MRI ABDOMEN WITHOUT AND WITH CONTRAST (INCLUDING MRCP)  TECHNIQUE: Multiplanar multisequence MR imaging of the abdomen was performed both before and after the administration of intravenous contrast. Heavily T2-weighted images of the biliary and pancreatic ducts were obtained, and three-dimensional MRCP images were rendered by post processing.  CONTRAST:  95m MULTIHANCE GADOBENATE DIMEGLUMINE 529 MG/ML IV SOLN  COMPARISON:  CT abdomen/pelvis from 06/09/2004. Abdominal sonogram from earlier today.  FINDINGS: Lower chest:  Clear lung bases.  Hepatobiliary: Normal liver size mild diffuse hepatic steatosis. No liver masses. Mildly distended gallbladder contains numerous subcentimeter gallstones, including gallstones within the gallbladder neck. There is moderate diffuse gallbladder wall thickening, gallbladder wall mucosal hyperenhancement and trace pericholecystic fluid. No intrahepatic biliary ductal dilatation. Normal common bile duct diameter of 5 mm. On the coronal T2 weighted and MRCP images, there is 2 mm filling defect within the distal common bile duct, suggestive of a tiny nonobstructing choledocholith.  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 Otherwise normal pancreas, with no main pancreatic duct dilation and no evidence of pancreas divisum.  Spleen: Normal.  Adrenals/Urinary Tract: Normal adrenals. Normal kidneys, with no hydronephrosis. No renal lesions.  Stomach/Bowel: Collapsed and grossly normal stomach. Normal caliber visualized small and large bowel.  Vascular/Lymphatic: Mild ectasia of the infrarenal abdominal aorta, maximum diameter 2.3 cm. No abdominal lymphadenopathy. Portal, splenic and hepatic veins are patent.  Other: No ascites or focal fluid collection.  Musculoskeletal: No  suspicious focal osseous lesions.  IMPRESSION: 1. Mildly distended gallbladder with cholelithiasis. Moderate diffuse gallbladder wall thickening. Gallbladder wall mucosal hyperenhancement. Trace pericholecystic fluid. MRI findings suggest acute calculous cholecystitis. 2. No intrahepatic or extrahepatic biliary ductal dilatation. Suggestion of a tiny 2 mm nonobstructing choledocholith in the distal common bile duct. 3. 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 No pancreatic duct dilation. 4. Ectatic infrarenal abdominal aorta, 2.3 cm diameter. 5. Mild diffuse hepatic steatosis.  These results were called by telephone at the time of interpretation on 08/05/2014 at 5:16 pm to Dr. DClementeen Graham who verbally acknowledged these results.   Electronically Signed   By: JIlona SorrelM.D.   On: 08/05/2014 17:17   [place]Abdomen Limited  08/05/2014   CLINICAL DATA:  Right upper quadrant abdominal pain  EXAM: [place]ABDOMEN LIMITED - RIGHT UPPER QUADRANT  COMPARISON:  None.  FINDINGS: Gallbladder:  Multiple mobile calculi are present in the gallbladder lumen. There is mild gallbladder wall thickening, 4 mm. The patient was tender over the gallbladder.  Common bile duct:  Diameter: 4 mm  Liver:  No focal lesion identified. Within normal limits in parenchymal echogenicity.  IMPRESSION: Cholelithiasis. Mild gallbladder mural thickening and mild tenderness. This could represent cholecystitis.   Electronically Signed   By: DAndreas NewportM.D.   On: 08/05/2014 04:13   Mr AJeananne RamaW/wo Cm/mrcp  08/05/2014  CLINICAL DATA:  81 year old female inpatient with intermittent abdominal pain and nausea for 5 days. Cholelithiasis, gallbladder wall thickening and tenderness on abdominal sonogram from suggestive of acute cholecystitis.  EXAM: MRI ABDOMEN WITHOUT AND WITH CONTRAST (INCLUDING MRCP)  TECHNIQUE: Multiplanar multisequence MR imaging of the abdomen was performed both before and after the administration of intravenous  contrast. Heavily T2-weighted images of the biliary and pancreatic ducts were obtained, and three-dimensional MRCP images were rendered by post processing.  CONTRAST:  51m MULTIHANCE GADOBENATE DIMEGLUMINE 529 MG/ML IV SOLN  COMPARISON:  CT abdomen/pelvis from 06/09/2004. Abdominal sonogram from earlier today.  FINDINGS: Lower chest:  Clear lung bases.  Hepatobiliary: Normal liver size mild diffuse hepatic steatosis. No liver masses. Mildly distended gallbladder contains numerous subcentimeter gallstones, including gallstones within the gallbladder neck. There is moderate diffuse gallbladder wall thickening, gallbladder wall mucosal hyperenhancement and trace pericholecystic fluid. No intrahepatic biliary ductal dilatation. Normal common bile duct diameter of 5 mm. On the coronal T2 weighted and MRCP images, there is 2 mm filling defect within the distal common bile duct, suggestive of a tiny nonobstructing choledocholith.  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 Otherwise normal pancreas, with no main pancreatic duct dilation and no evidence of pancreas divisum.  Spleen: Normal.  Adrenals/Urinary Tract: Normal adrenals. Normal kidneys, with no hydronephrosis. No renal lesions.  Stomach/Bowel: Collapsed and grossly normal stomach. Normal caliber visualized small and large bowel.  Vascular/Lymphatic: Mild ectasia of the infrarenal abdominal aorta, maximum diameter 2.3 cm. No abdominal lymphadenopathy. Portal, splenic and hepatic veins are patent.  Other: No ascites or focal fluid collection.  Musculoskeletal: No suspicious focal osseous lesions.  IMPRESSION: 1. Mildly distended gallbladder with cholelithiasis. Moderate diffuse gallbladder wall thickening. Gallbladder wall mucosal hyperenhancement. Trace pericholecystic fluid. MRI findings suggest acute calculous cholecystitis. 2. No intrahepatic or extrahepatic biliary ductal  dilatation. Suggestion of a tiny 2 mm nonobstructing choledocholith in the distal common bile duct. 3. 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 No pancreatic duct dilation. 4. Ectatic infrarenal abdominal aorta, 2.3 cm diameter. 5. Mild diffuse hepatic steatosis.  These results were called by telephone at the time of interpretation on 08/05/2014 at 5:16 pm to Dr. DClementeen Graham who verbally acknowledged these results.   Electronically Signed   By: JIlona SorrelM.D.   On: 08/05/2014 17:17    Medications / Allergies:  Scheduled Meds: . aspirin EC  81 mg Oral Daily  . cefTRIAXone (ROCEPHIN)  IV  2 g Intravenous Q24H  . heparin  5,000 Units Subcutaneous 3 times per day  . levothyroxine  112 mcg Oral QAC breakfast  .  morphine injection  4 mg Intravenous Once  . pantoprazole (PROTONIX) IV  40 mg Intravenous Q24H   Continuous Infusions: . sodium chloride    . dextrose 5 % and 0.9% NaCl 75 mL/hr at 08/05/14 1303   PRN Meds:.acetaminophen **OR** acetaminophen, alum & mag hydroxide-simeth, morphine injection, ondansetron **OR** ondansetron (ZOFRAN) IV, senna-docusate  Antibiotics: Anti-infectives    Start     Dose/Rate Route Frequency Ordered Stop   08/05/14 1000  cefTRIAXone (ROCEPHIN) 2 g in dextrose 5 % 50 mL IVPB - Premix    Comments:  Pharmacy may adjust dosing strength / duration / interval for maximal efficacy   2 g 100 mL/hr over 30 Minutes Intravenous Every 24 hours 08/05/14 0914     08/05/14 0545  cefTRIAXone (ROCEPHIN)  1 g in dextrose 5 % 50 mL IVPB     1 g 100 mL/hr over 30 Minutes Intravenous  Once 08/05/14 0535 08/05/14 0618        Assessment/Plan Acute cholecystitis Choledocholithiasis -? Passed CBD stone with LFTs trending down. Discussed with Dr. Benson Norway.  Will proceed with a lap chole with ioc today -intermediate risk for a moderate risk surgery per cards(moderate aortic stenosis, dCHF) -obtain consent ID-rocephin D#1 FEN-NPO, gentle IVF, pain  control  Erby Pian, ANP-BC [facility] Pager [phone](7A-4:30P) For consults and floor pages call [phone](7A-4:30P)  08/06/2014 7:59 AM

## 2014-08-07 ENCOUNTER — Encounter (HOSPITAL_COMMUNITY): Payer: Self-pay | Admitting: General Surgery

## 2014-08-07 LAB — COMPREHENSIVE METABOLIC PANEL
ALK PHOS: 152 U/L — AB (ref 38–126)
ALT: 272 U/L — ABNORMAL HIGH (ref 14–54)
ANION GAP: 8 (ref 5–15)
AST: 163 U/L — ABNORMAL HIGH (ref 15–41)
Albumin: 3 g/dL — ABNORMAL LOW (ref 3.5–5.0)
BUN: 10 mg/dL (ref 6–20)
CALCIUM: 8.8 mg/dL — AB (ref 8.9–10.3)
CHLORIDE: 103 mmol/L (ref 101–111)
CO2: 27 mmol/L (ref 22–32)
CREATININE: 0.91 mg/dL (ref 0.44–1.00)
GFR calc Af Amer: >60 mL/min (ref >60)
GFR calc non Af Amer: 58 mL/min — ABNORMAL LOW (ref >60)
Glucose, Bld: 119 mg/dL — ABNORMAL HIGH (ref 65–99)
POTASSIUM: 4.3 mmol/L (ref 3.5–5.1)
Sodium: 138 mmol/L (ref 135–145)
TOTAL PROTEIN: 6 g/dL — AB (ref 6.5–8.1)
Total Bilirubin: 1 mg/dL (ref 0.3–1.2)

## 2014-08-07 LAB — CBC
HCT: 37.8 % (ref 36.0–46.0)
HEMOGLOBIN: 12.4 g/dL (ref 12.0–15.0)
MCH: 31.8 pg (ref 26.0–34.0)
MCHC: 32.8 g/dL (ref 30.0–36.0)
MCV: 96.9 fL (ref 78.0–100.0)
Platelets: 192 10*3/uL (ref 150–400)
RBC: 3.9 MIL/uL (ref 3.87–5.11)
RDW: 13.3 % (ref 11.5–15.5)
WBC: 6.5 10*3/uL (ref 4.0–10.5)

## 2014-08-07 MED ORDER — SENNOSIDES-DOCUSATE SODIUM 8.6-50 MG PO TABS: 1.0000 | ORAL_TABLET | Freq: Every evening | ORAL | Status: DC | PRN

## 2014-08-07 MED ORDER — HYDROCODONE-ACETAMINOPHEN 5-325 MG PO TABS: 1.0000 | ORAL_TABLET | ORAL | Status: DC | PRN

## 2014-08-07 NOTE — Discharge Instructions (Signed)
[facility] ______[facility] °LAPAROSCOPIC SURGERY: POST OP INSTRUCTIONS °Always review your discharge instruction sheet given to you by the facility where your surgery was performed. °IF YOU HAVE DISABILITY OR FAMILY LEAVE FORMS, YOU MUST BRING THEM TO THE OFFICE FOR PROCESSING.   °DO NOT GIVE THEM TO YOUR DOCTOR. ° °1. A prescription for pain medication may be given to you upon discharge.  Take your pain medication as prescribed, if needed.  If narcotic pain medicine is not needed, then you may take acetaminophen (Tylenol) or ibuprofen (Advil) as needed. °2. Take your usually prescribed medications unless otherwise directed. °3. If you need a refill on your pain medication, please contact your pharmacy.  They will contact our office to request authorization. Prescriptions will not be filled after 5pm or on week-ends. °4. You should follow a light diet the first few days after arrival home, such as soup and crackers, etc.  Be sure to include lots of fluids daily. °5. Most patients will experience some swelling and bruising in the area of the incisions.  Ice packs will help.  Swelling and bruising can take several days to resolve.  °6. It is common to experience some constipation if taking pain medication after surgery.  Increasing fluid intake and taking a stool softener (such as Colace) will usually help or prevent this problem from occurring.  A mild laxative (Milk of Magnesia or Miralax) should be taken according to package instructions if there are no bowel movements after 48 hours. °7. Unless discharge instructions indicate otherwise, you may remove your bandages 24-48 hours after surgery, and you may shower at that time.  You may have steri-strips (small skin tapes) in place directly over the incision.  These strips should be left on the skin for 7-10 days.  If your surgeon used skin glue on the incision, you may shower in 24 hours.  The glue will flake off over the next 2-3 weeks.  Any sutures or  staples will be removed at the office during your follow-up visit. °8. ACTIVITIES:  You may resume regular (light) daily activities beginning the next day--such as daily self-care, walking, climbing stairs--gradually increasing activities as tolerated.  You may have sexual intercourse when it is comfortable.  Refrain from any heavy lifting or straining until approved by your doctor. °a. You may drive when you are no longer taking prescription pain medication, you can comfortably wear a seatbelt, and you can safely maneuver your car and apply brakes. °b. RETURN TO WORK:  __________________________________________________________ °9. You should see your doctor in the office for a follow-up appointment approximately 2-3 weeks after your surgery.  Make sure that you call for this appointment within a day or two after you arrive home to insure a convenient appointment time. °10. OTHER INSTRUCTIONS: __________________________________________________________________________________________________________________________ __________________________________________________________________________________________________________________________ °WHEN TO CALL YOUR DOCTOR: °1. Fever over 101.0 °2. Inability to urinate °3. Continued bleeding from incision. °4. Increased pain, redness, or drainage from the incision. °5. Increasing abdominal pain ° °The clinic staff is available to answer your questions during regular business hours.  Please don’t hesitate to call and ask to speak to one of the nurses for clinical concerns.  If you have a medical emergency, go to the nearest emergency room or call 911.  A surgeon from [facility] is always on call at the hospital. °[address], [address], [place], [place]  [address] ? [address] [address], [place], [place]   [address] °[phone] ? [phone] ? FAX [phone] °Web site:   [email or web address] °

## 2014-08-07 NOTE — Progress Notes (Signed)
Discharge instructions gone over with patient. Home medications reconciled and gone over. Prescriptions given. Follow up appointment is made. Signs and symptoms of infection or worsening condition gone over. Diet, incisional care, bowel regimen,  and reasons to call the doctor discussed. Patient verbalized understanding of instructions

## 2014-08-07 NOTE — Progress Notes (Signed)
Patient ID: [name], female   DOB: 12-23-1931, 81 y.o.   MRN: [id number] 1 Day Post-Op  Subjective: Pt looks great.  Feels well, with minimal pain except soreness.  Tolerating liquids this am  Objective: Vital signs in last 24 hours: Temp:  [95.8 F (35.4 C)-98 F (36.7 C)] 98 F (36.7 C) (08/03 0523) Pulse Rate:  [52-77] 56 (08/03 0523) Resp:  [12-22] 18 (08/03 0523) BP: (108-144)/(45-69) 109/51 mmHg (08/03 0523) SpO2:  [90 %-98 %] 96 % (08/03 0523) Last BM Date: 08/04/14  Intake/Output from previous day: 08/02 0701 - 08/03 0700 In: 6712 [P.O.:120; I.V.:1200; IV Piggyback:100] Out: 300 [Urine:200; Blood:100] Intake/Output this shift:    PE: Abd: soft, minimally tender, +BS, ND, incisions c/d/i Heart: regular  Lab Results:   Recent Labs  08/06/14 0305 08/07/14 0600  WBC 3.1* 6.5  HGB 12.7 12.4  HCT 39.2 37.8  PLT 213 192   BMET  Recent Labs  08/06/14 0305 08/07/14 0600  NA 139 138  K 3.8 4.3  CL 103 103  CO2 25 27  GLUCOSE 96 119*  BUN 9 10  CREATININE 0.75 0.91  CALCIUM 8.8* 8.8*   PT/INR No results for input(s): LABPROT, INR in the last 72 hours. CMP     Component Value Date/Time   NA 138 08/07/2014 0600   K 4.3 08/07/2014 0600   CL 103 08/07/2014 0600   CO2 27 08/07/2014 0600   GLUCOSE 119* 08/07/2014 0600   BUN 10 08/07/2014 0600   CREATININE 0.91 08/07/2014 0600   CALCIUM 8.8* 08/07/2014 0600   PROT 6.0* 08/07/2014 0600   ALBUMIN 3.0* 08/07/2014 0600   AST 163* 08/07/2014 0600   ALT 272* 08/07/2014 0600   ALKPHOS 152* 08/07/2014 0600   BILITOT 1.0 08/07/2014 0600   GFRNONAA 58* 08/07/2014 0600   GFRAA >60 08/07/2014 0600   Lipase     Component Value Date/Time   LIPASE 28 08/05/2014 0305       Studies/Results: Mr 3d Recon At Scanner  08/05/2014   CLINICAL DATA:  81 year old female inpatient with intermittent abdominal pain and nausea for 5 days. Cholelithiasis, gallbladder wall thickening and tenderness on abdominal  sonogram from suggestive of acute cholecystitis.  EXAM: MRI ABDOMEN WITHOUT AND WITH CONTRAST (INCLUDING MRCP)  TECHNIQUE: Multiplanar multisequence MR imaging of the abdomen was performed both before and after the administration of intravenous contrast. Heavily T2-weighted images of the biliary and pancreatic ducts were obtained, and three-dimensional MRCP images were rendered by post processing.  CONTRAST:  84mL MULTIHANCE GADOBENATE DIMEGLUMINE 529 MG/ML IV SOLN  COMPARISON:  CT abdomen/pelvis from 06/09/2004. Abdominal sonogram from earlier today.  FINDINGS: Lower chest:  Clear lung bases.  Hepatobiliary: Normal liver size mild diffuse hepatic steatosis. No liver masses. Mildly distended gallbladder contains numerous subcentimeter gallstones, including gallstones within the gallbladder neck. There is moderate diffuse gallbladder wall thickening, gallbladder wall mucosal hyperenhancement and trace pericholecystic fluid. No intrahepatic biliary ductal dilatation. Normal common bile duct diameter of 5 mm. On the coronal T2 weighted and MRCP images, there is 2 mm filling defect within the distal common bile duct, suggestive of a tiny nonobstructing choledocholith.  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 Otherwise normal pancreas, with no main pancreatic duct dilation and no evidence of pancreas divisum.  Spleen: Normal.  Adrenals/Urinary Tract: Normal adrenals. Normal kidneys, with no hydronephrosis. No renal lesions.  Stomach/Bowel: Collapsed and  grossly normal stomach. Normal caliber visualized small and large bowel.  Vascular/Lymphatic: Mild ectasia of the infrarenal abdominal aorta, maximum diameter 2.3 cm. No abdominal lymphadenopathy. Portal, splenic and hepatic veins are patent.  Other: No ascites or focal fluid collection.  Musculoskeletal: No suspicious focal osseous lesions.  IMPRESSION: 1. Mildly distended  gallbladder with cholelithiasis. Moderate diffuse gallbladder wall thickening. Gallbladder wall mucosal hyperenhancement. Trace pericholecystic fluid. MRI findings suggest acute calculous cholecystitis. 2. No intrahepatic or extrahepatic biliary ductal dilatation. Suggestion of a tiny 2 mm nonobstructing choledocholith in the distal common bile duct. 3. 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 No pancreatic duct dilation. 4. Ectatic infrarenal abdominal aorta, 2.3 cm diameter. 5. Mild diffuse hepatic steatosis.  These results were called by telephone at the time of interpretation on 08/05/2014 at 5:16 pm to Dr. Clementeen Graham, who verbally acknowledged these results.   Electronically Signed   By: Ilona Sorrel M.D.   On: 08/05/2014 17:17   Mr Jeananne Rama W/wo Cm/mrcp  08/05/2014   CLINICAL DATA:  81 year old female inpatient with intermittent abdominal pain and nausea for 5 days. Cholelithiasis, gallbladder wall thickening and tenderness on abdominal sonogram from suggestive of acute cholecystitis.  EXAM: MRI ABDOMEN WITHOUT AND WITH CONTRAST (INCLUDING MRCP)  TECHNIQUE: Multiplanar multisequence MR imaging of the abdomen was performed both before and after the administration of intravenous contrast. Heavily T2-weighted images of the biliary and pancreatic ducts were obtained, and three-dimensional MRCP images were rendered by post processing.  CONTRAST:  66mL MULTIHANCE GADOBENATE DIMEGLUMINE 529 MG/ML IV SOLN  COMPARISON:  CT abdomen/pelvis from 06/09/2004. Abdominal sonogram from earlier today.  FINDINGS: Lower chest:  Clear lung bases.  Hepatobiliary: Normal liver size mild diffuse hepatic steatosis. No liver masses. Mildly distended gallbladder contains numerous subcentimeter gallstones, including gallstones within the gallbladder neck. There is moderate diffuse gallbladder wall thickening, gallbladder wall mucosal hyperenhancement and trace pericholecystic fluid. No intrahepatic biliary ductal  dilatation. Normal common bile duct diameter of 5 mm. On the coronal T2 weighted and MRCP images, there is 2 mm filling defect within the distal common bile duct, suggestive of a tiny nonobstructing choledocholith.  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 Otherwise normal pancreas, with no main pancreatic duct dilation and no evidence of pancreas divisum.  Spleen: Normal.  Adrenals/Urinary Tract: Normal adrenals. Normal kidneys, with no hydronephrosis. No renal lesions.  Stomach/Bowel: Collapsed and grossly normal stomach. Normal caliber visualized small and large bowel.  Vascular/Lymphatic: Mild ectasia of the infrarenal abdominal aorta, maximum diameter 2.3 cm. No abdominal lymphadenopathy. Portal, splenic and hepatic veins are patent.  Other: No ascites or focal fluid collection.  Musculoskeletal: No suspicious focal osseous lesions.  IMPRESSION: 1. Mildly distended gallbladder with cholelithiasis. Moderate diffuse gallbladder wall thickening. Gallbladder wall mucosal hyperenhancement. Trace pericholecystic fluid. MRI findings suggest acute calculous cholecystitis. 2. No intrahepatic or extrahepatic biliary ductal dilatation. Suggestion of a tiny 2 mm nonobstructing choledocholith in the distal common bile duct. 3. 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 No pancreatic duct dilation. 4. Ectatic infrarenal abdominal aorta, 2.3 cm diameter. 5. Mild diffuse hepatic steatosis.  These results were called by telephone at the time of interpretation on 08/05/2014 at 5:16 pm to Dr. Clementeen Graham, who verbally acknowledged these results.   Electronically Signed   By: Ilona Sorrel M.D.   On: 08/05/2014 17:17    Anti-infectives: Anti-infectives  Start     Dose/Rate Route Frequency Ordered Stop   08/06/14 0900  piperacillin-tazobactam (ZOSYN) IVPB 3.375 g     3.375 g 12.5 mL/hr over 240 Minutes Intravenous 3  times per day 08/06/14 0814     08/05/14 1000  cefTRIAXone (ROCEPHIN) 2 g in dextrose 5 % 50 mL IVPB - Premix  Status:  Discontinued    Comments:  Pharmacy may adjust dosing strength / duration / interval for maximal efficacy   2 g 100 mL/hr over 30 Minutes Intravenous Every 24 hours 08/05/14 0914 08/06/14 0808   08/05/14 0545  cefTRIAXone (ROCEPHIN) 1 g in dextrose 5 % 50 mL IVPB     1 g 100 mL/hr over 30 Minutes Intravenous  Once 08/05/14 0535 08/05/14 0618       Assessment/Plan  POD 1, s/p lap chole -patient doing well this morning.   -advance to low fat diet -LFTs trending down -patient surgically stable for dc home today if medically stable -follow up in our office in 3 weeks.  appt made and given   LOS: 2 days    Richar Dunklee E 08/07/2014, 8:46 AM Pager: [phone]

## 2014-08-07 NOTE — Discharge Summary (Signed)
He will have a 811Physician Discharge Summary  [name] EOF:[id number] DOB: [birth year] DOA: 08/05/2014  PCP: Simona Huh, MD  Admit date: 08/05/2014 Discharge date: 08/07/2014  Time spent: 45 minutes  Recommendations for Outpatient Follow-up:  1. [facility] on 8/23 at 2:30pm  Discharge Diagnoses:  Principal Problem:   Acute Cholecystitis Active Problems:   Aortic stenosis   Chronic diastolic heart failure   Hypotension   Breast cancer, right breast   PUD (peptic ulcer disease)   Hypothyroidism   Hypercholesteremia   Abnormal transaminases   Discharge Condition: stable  Diet recommendation: low fat diet  Filed Weights   08/05/14 0227  Weight: 83.008 kg (183 lb)    History of present illness:  Chief Complaint: abdominal pain HPI: [name] is a 81 y.o. female with moderate aortic stenosis, chronic diastolic CHF, peptic ulcer disease, hypothyroidism, hypercholesteremia,  Presented to ER for chest pain, abdominal pain, nausea, and faintness x 5 days. Symptoms of abdominal pain and nausea began shortly after eating a biscuit with egg and cheese. Patient had a similar episode of abdominal pain 3 weeks ago but also had accompanying vomiting and diarrhea. She went to her PCP at that time and was told it could be her gallbladder and to report to the emergency room if there was a recurrence  Hospital Course:  1. Acute cholecystitis/choledocholithiasis -clinically improved, LFts improved and symptomatically much improved by day 2 and hence we suspect she passed a stone -seen by GI and [facility]CS -underwent Lap Chole 8/2, tolerated this well,  -tolerated lunch today and cleared for discharge by [facility], LFTS improving - will need FU with Surgery in 3 weeks  2. Moderate AS and Chronic diastolic CHF - cleared by Cardiology for surgery on admission - stable, lasix resumed  3. PUD -continue PPI  4. hypothyroidism -continue synthroid  Consultations:  [facility]  Discharge  Exam: Filed Vitals:   08/07/14 1026  BP: 104/47  Pulse:   Temp:   Resp:     General: AAOx3 Cardiovascular: S1S2/RRR Respiratory: CTAB  Discharge Instructions   Discharge Instructions    Discharge instructions    Complete by:  As directed   Low fat diet     Increase activity slowly    Complete by:  As directed           Current Discharge Medication List    START taking these medications   Details  HYDROcodone-acetaminophen (NORCO/VICODIN) 5-325 MG per tablet Take 1-2 tablets by mouth every 4 (four) hours as needed for moderate pain or severe pain. Qty: 40 tablet, Refills: 0    senna-docusate (SENOKOT-S) 8.6-50 MG per tablet Take 1 tablet by mouth at bedtime as needed for mild constipation. Qty: 20 tablet, Refills: 0      CONTINUE these medications which have NOT CHANGED   Details  aspirin EC 81 MG tablet Take 81 mg by mouth daily.    Calcium Carbonate-Vitamin D (CALCIUM + D PO) Take 1 tablet by mouth daily.    Coenzyme Q10 (COQ-10 PO) Take 1 tablet by mouth daily.     Cyanocobalamin (VITAMIN B-12 PO) Take 1 tablet by mouth daily.    desloratadine (CLARINEX REDITAB) 5 MG disintegrating tablet Take 5 mg by mouth daily as needed (for allergy symptoms).    fish oil-omega-3 fatty acids 1000 MG capsule Take 1 g by mouth daily.    furosemide (LASIX) 20 MG tablet Take 20 mg by mouth daily.    lansoprazole (PREVACID) 30 MG capsule Take 30  mg by mouth daily.    levothyroxine (SYNTHROID, LEVOTHROID) 112 MCG tablet Take 112 mcg by mouth daily before breakfast.    magnesium gluconate (MAGONATE) 500 MG tablet Take 500 mg by mouth 2 (two) times daily.     Multiple Vitamin (MULTIVITAMIN WITH MINERALS) TABS tablet Take 1 tablet by mouth daily.    potassium chloride (K-DUR,KLOR-CON) 10 MEQ tablet Take 10 mEq by mouth daily.    Probiotic Product (PROBIOTIC DAILY PO) Take 1 tablet by mouth daily.     Vitamin D, Ergocalciferol, (DRISDOL) 50000 UNITS CAPS capsule Take 50,000  Units by mouth every 7 (seven) days. Saturday      STOP taking these medications     ibuprofen (ADVIL,MOTRIN) 200 MG tablet        Allergies  Allergen Reactions  . Atorvastatin     Myalgias 10/2011  . Compazine [Prochlorperazine]     unknown  . Fenofibrate     Rash 09/2011  . Floxin [Ofloxacin]     unknown  . Livalo [Pitavastatin] Other (See Comments)    Leg pain  . Pravastatin Sodium     mylgias 09/2011  . Simvastatin     Elevated lft's 06/2011   Follow-up Information    Follow up with [facility] On 08/27/2014.   Specialty:  General Surgery   Why:  Doc of the [facility], 3:00pm, arrive no later than 2:30pm for paperwork   Contact information:   [place] [phone]        The results of significant diagnostics from this hospitalization (including imaging, microbiology, ancillary and laboratory) are listed below for reference.    Significant Diagnostic Studies: Dg Chest 2 View  08/05/2014   CLINICAL DATA:  Cough, back pain and abdomen pain  EXAM: CHEST  2 VIEW  COMPARISON:  10/21/2012  FINDINGS: There is mild interstitial coarsening. There is no airspace consolidation. There is no effusion. The pulmonary vasculature is normal.  IMPRESSION: Mild interstitial coarsening.  This is of indeterminate chronicity.   Electronically Signed   By: Andreas Newport M.D.   On: 08/05/2014 02:58   Mr 3d Recon At Scanner  08/05/2014   CLINICAL DATA:  81 year old female inpatient with intermittent abdominal pain and nausea for 5 days. Cholelithiasis, gallbladder wall thickening and tenderness on abdominal sonogram from suggestive of acute cholecystitis.  EXAM: MRI ABDOMEN WITHOUT AND WITH CONTRAST (INCLUDING MRCP)  TECHNIQUE: Multiplanar multisequence MR imaging of the abdomen was performed both before and after the administration of intravenous contrast. Heavily T2-weighted images of the biliary and pancreatic ducts were obtained, and  three-dimensional MRCP images were rendered by post processing.  CONTRAST:  24mL MULTIHANCE GADOBENATE DIMEGLUMINE 529 MG/ML IV SOLN  COMPARISON:  CT abdomen/pelvis from 06/09/2004. Abdominal sonogram from earlier today.  FINDINGS: Lower chest:  Clear lung bases.  Hepatobiliary: Normal liver size mild diffuse hepatic steatosis. No liver masses. Mildly distended gallbladder contains numerous subcentimeter gallstones, including gallstones within the gallbladder neck. There is moderate diffuse gallbladder wall thickening, gallbladder wall mucosal hyperenhancement and trace pericholecystic fluid. No intrahepatic biliary ductal dilatation. Normal common bile duct diameter of 5 mm. On the coronal T2 weighted and MRCP images, there is 2 mm filling defect within the distal common bile duct, suggestive of a tiny nonobstructing choledocholith.  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 Otherwise normal pancreas, with no main pancreatic duct  dilation and no evidence of pancreas divisum.  Spleen: Normal.  Adrenals/Urinary Tract: Normal adrenals. Normal kidneys, with no hydronephrosis. No renal lesions.  Stomach/Bowel: Collapsed and grossly normal stomach. Normal caliber visualized small and large bowel.  Vascular/Lymphatic: Mild ectasia of the infrarenal abdominal aorta, maximum diameter 2.3 cm. No abdominal lymphadenopathy. Portal, splenic and hepatic veins are patent.  Other: No ascites or focal fluid collection.  Musculoskeletal: No suspicious focal osseous lesions.  IMPRESSION: 1. Mildly distended gallbladder with cholelithiasis. Moderate diffuse gallbladder wall thickening. Gallbladder wall mucosal hyperenhancement. Trace pericholecystic fluid. MRI findings suggest acute calculous cholecystitis. 2. No intrahepatic or extrahepatic biliary ductal dilatation. Suggestion of a tiny 2 mm nonobstructing choledocholith in the distal common bile  duct. 3. 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 No pancreatic duct dilation. 4. Ectatic infrarenal abdominal aorta, 2.3 cm diameter. 5. Mild diffuse hepatic steatosis.  These results were called by telephone at the time of interpretation on 08/05/2014 at 5:16 pm to Dr. Clementeen Graham, who verbally acknowledged these results.   Electronically Signed   By: Ilona Sorrel M.D.   On: 08/05/2014 17:17   US Abdomen Limited  08/05/2014   CLINICAL DATA:  Right upper quadrant abdominal pain  EXAM: US ABDOMEN LIMITED - RIGHT UPPER QUADRANT  COMPARISON:  None.  FINDINGS: Gallbladder:  Multiple mobile calculi are present in the gallbladder lumen. There is mild gallbladder wall thickening, 4 mm. The patient was tender over the gallbladder.  Common bile duct:  Diameter: 4 mm  Liver:  No focal lesion identified. Within normal limits in parenchymal echogenicity.  IMPRESSION: Cholelithiasis. Mild gallbladder mural thickening and mild tenderness. This could represent cholecystitis.   Electronically Signed   By: Andreas Newport M.D.   On: 08/05/2014 04:13   Mr Jeananne Rama W/wo Cm/mrcp  08/05/2014   CLINICAL DATA:  81 year old female inpatient with intermittent abdominal pain and nausea for 5 days. Cholelithiasis, gallbladder wall thickening and tenderness on abdominal sonogram from suggestive of acute cholecystitis.  EXAM: MRI ABDOMEN WITHOUT AND WITH CONTRAST (INCLUDING MRCP)  TECHNIQUE: Multiplanar multisequence MR imaging of the abdomen was performed both before and after the administration of intravenous contrast. Heavily T2-weighted images of the biliary and pancreatic ducts were obtained, and three-dimensional MRCP images were rendered by post processing.  CONTRAST:  22mL MULTIHANCE GADOBENATE DIMEGLUMINE 529 MG/ML IV SOLN  COMPARISON:  CT abdomen/pelvis from 06/09/2004. Abdominal sonogram from earlier today.  FINDINGS: Lower chest:  Clear lung bases.  Hepatobiliary: Normal liver size mild diffuse hepatic  steatosis. No liver masses. Mildly distended gallbladder contains numerous subcentimeter gallstones, including gallstones within the gallbladder neck. There is moderate diffuse gallbladder wall thickening, gallbladder wall mucosal hyperenhancement and trace pericholecystic fluid. No intrahepatic biliary ductal dilatation. Normal common bile duct diameter of 5 mm. On the coronal T2 weighted and MRCP images, there is 2 mm filling defect within the distal common bile duct, suggestive of a tiny nonobstructing choledocholith.  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 Otherwise normal pancreas, with no main pancreatic duct dilation and no evidence of pancreas divisum.  Spleen: Normal.  Adrenals/Urinary Tract: Normal adrenals. Normal kidneys, with no hydronephrosis. No renal lesions.  Stomach/Bowel: Collapsed and grossly normal stomach. Normal caliber visualized small and large bowel.  Vascular/Lymphatic: Mild ectasia of the infrarenal abdominal aorta, maximum diameter 2.3 cm. No abdominal lymphadenopathy. Portal, splenic and hepatic veins are patent.  Other: No ascites  or focal fluid collection.  Musculoskeletal: No suspicious focal osseous lesions.  IMPRESSION: 1. Mildly distended gallbladder with cholelithiasis. Moderate diffuse gallbladder wall thickening. Gallbladder wall mucosal hyperenhancement. Trace pericholecystic fluid. MRI findings suggest acute calculous cholecystitis. 2. No intrahepatic or extrahepatic biliary ductal dilatation. Suggestion of a tiny 2 mm nonobstructing choledocholith in the distal common bile duct. 3. 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 No pancreatic duct dilation. 4. Ectatic infrarenal abdominal aorta, 2.3 cm diameter. 5. Mild diffuse hepatic steatosis.  These results were called by telephone at the time of interpretation on 08/05/2014 at 5:16 pm to Dr. Clementeen Graham, who verbally  acknowledged these results.   Electronically Signed   By: Ilona Sorrel M.D.   On: 08/05/2014 17:17    Microbiology: Recent Results (from the past 240 hour(s))  Surgical pcr screen     Status: None   Collection Time: 08/06/14  5:13 AM  Result Value Ref Range Status   MRSA, PCR NEGATIVE NEGATIVE Final   Staphylococcus aureus NEGATIVE NEGATIVE Final    Comment:        The Xpert SA Assay (FDA approved for NASAL specimens in patients over 90 years of age), is one component of a comprehensive surveillance program.  Test performance has been validated by [facility] for patients greater than or equal to 85 year old. It is not intended to diagnose infection nor to guide or monitor treatment.      Labs: Basic Metabolic Panel:  Recent Labs Lab 08/05/14 0305 08/06/14 0305 08/07/14 0600  NA 137 139 138  K 4.0 3.8 4.3  CL 101 103 103  CO2 27 25 27   GLUCOSE 144* 96 119*  BUN 16 9 10   CREATININE 1.01* 0.75 0.91  CALCIUM 9.4 8.8* 8.8*   Liver Function Tests:  Recent Labs Lab 08/05/14 0305 08/06/14 0305 08/07/14 0600  AST 471* 285* 163*  ALT 323* 360* 272*  ALKPHOS 145* 170* 152*  BILITOT 3.1* 1.5* 1.0  PROT 7.0 6.2* 6.0*  ALBUMIN 3.6 3.1* 3.0*    Recent Labs Lab 08/05/14 0305  LIPASE 28   No results for input(s): AMMONIA in the last 168 hours. CBC:  Recent Labs Lab 08/05/14 0305 08/06/14 0305 08/07/14 0600  WBC 6.4 3.1* 6.5  NEUTROABS 4.8  --   --   HGB 14.0 12.7 12.4  HCT 41.8 39.2 37.8  MCV 95.2 95.8 96.9  PLT 242 213 192   Cardiac Enzymes: No results for input(s): CKTOTAL, CKMB, CKMBINDEX, TROPONINI in the last 168 hours. BNP: BNP (last 3 results)  Recent Labs  08/05/14 0305  BNP 54.1    ProBNP (last 3 results)  Recent Labs  05/14/14 0828  PROBNP 90.0    CBG: No results for input(s): GLUCAP in the last 168 hours.     SignedDomenic Polite  [facility] 08/07/2014, 1:45 PM

## 2014-08-07 NOTE — Progress Notes (Signed)
Subjective: Sore post operatively, but overall she feels fine.  Objective: Vital signs in last 24 hours: Temp:  [95.8 F (35.4 C)-98 F (36.7 C)] 98 F (36.7 C) (08/03 0523) Pulse Rate:  [52-77] 56 (08/03 0523) Resp:  [12-22] 18 (08/03 0523) BP: (108-144)/(45-69) 109/51 mmHg (08/03 0523) SpO2:  [90 %-98 %] 96 % (08/03 0523) Last BM Date: 08/04/14  Intake/Output from previous day: 08/02 0701 - 08/03 0700 In: 1420 [P.O.:120; I.V.:1200; IV Piggyback:100] Out: 300 [Urine:200; Blood:100] Intake/Output this shift:    General appearance: alert and no distress GI: tender at the incision sites  Lab Results:  Recent Labs  08/05/14 0305 08/06/14 0305 08/07/14 0600  WBC 6.4 3.1* 6.5  HGB 14.0 12.7 12.4  HCT 41.8 39.2 37.8  PLT 242 213 192   BMET  Recent Labs  08/05/14 0305 08/06/14 0305 08/07/14 0600  NA 137 139 138  K 4.0 3.8 4.3  CL 101 103 103  CO2 27 25 27   GLUCOSE 144* 96 119*  BUN 16 9 10   CREATININE 1.01* 0.75 0.91  CALCIUM 9.4 8.8* 8.8*   LFT  Recent Labs  08/07/14 0600  PROT 6.0*  ALBUMIN 3.0*  AST 163*  ALT 272*  ALKPHOS 152*  BILITOT 1.0   PT/INR No results for input(s): LABPROT, INR in the last 72 hours. Hepatitis Panel No results for input(s): HEPBSAG, HCVAB, HEPAIGM, HEPBIGM in the last 72 hours. C-Diff No results for input(s): CDIFFTOX in the last 72 hours. Fecal Lactopherrin No results for input(s): FECLLACTOFRN in the last 72 hours.  Studies/Results: Mr 3d Recon At Scanner  08/05/2014   CLINICAL DATA:  81 year old female inpatient with intermittent abdominal pain and nausea for 5 days. Cholelithiasis, gallbladder wall thickening and tenderness on abdominal sonogram from suggestive of acute cholecystitis.  EXAM: MRI ABDOMEN WITHOUT AND WITH CONTRAST (INCLUDING MRCP)  TECHNIQUE: Multiplanar multisequence MR imaging of the abdomen was performed both before and after the administration of intravenous contrast. Heavily T2-weighted images of  the biliary and pancreatic ducts were obtained, and three-dimensional MRCP images were rendered by post processing.  CONTRAST:  75mL MULTIHANCE GADOBENATE DIMEGLUMINE 529 MG/ML IV SOLN  COMPARISON:  CT abdomen/pelvis from 06/09/2004. Abdominal sonogram from earlier today.  FINDINGS: Lower chest:  Clear lung bases.  Hepatobiliary: Normal liver size mild diffuse hepatic steatosis. No liver masses. Mildly distended gallbladder contains numerous subcentimeter gallstones, including gallstones within the gallbladder neck. There is moderate diffuse gallbladder wall thickening, gallbladder wall mucosal hyperenhancement and trace pericholecystic fluid. No intrahepatic biliary ductal dilatation. Normal common bile duct diameter of 5 mm. On the coronal T2 weighted and MRCP images, there is 2 mm filling defect within the distal common bile duct, suggestive of a tiny nonobstructing choledocholith.  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 Otherwise normal pancreas, with no main pancreatic duct dilation and no evidence of pancreas divisum.  Spleen: Normal.  Adrenals/Urinary Tract: Normal adrenals. Normal kidneys, with no hydronephrosis. No renal lesions.  Stomach/Bowel: Collapsed and grossly normal stomach. Normal caliber visualized small and large bowel.  Vascular/Lymphatic: Mild ectasia of the infrarenal abdominal aorta, maximum diameter 2.3 cm. No abdominal lymphadenopathy. Portal, splenic and hepatic veins are patent.  Other: No ascites or focal fluid collection.  Musculoskeletal: No suspicious focal osseous lesions.  IMPRESSION: 1. Mildly distended gallbladder with cholelithiasis. Moderate diffuse gallbladder wall thickening. Gallbladder wall mucosal hyperenhancement. Trace pericholecystic fluid. MRI findings suggest acute  calculous cholecystitis. 2. No intrahepatic or extrahepatic biliary ductal dilatation. Suggestion of a tiny 2 mm  nonobstructing choledocholith in the distal common bile duct. 3. 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 No pancreatic duct dilation. 4. Ectatic infrarenal abdominal aorta, 2.3 cm diameter. 5. Mild diffuse hepatic steatosis.  These results were called by telephone at the time of interpretation on 08/05/2014 at 5:16 pm to [name], who verbally acknowledged these results.   Electronically Signed   By: Janice Sorrel M.George.   On: 08/05/2014 17:17   Mr [name] W/wo Cm/mrcp  08/05/2014   CLINICAL DATA:  81 year old female inpatient with intermittent abdominal pain and nausea for 5 days. Cholelithiasis, gallbladder wall thickening and tenderness on abdominal sonogram from suggestive of acute cholecystitis.  EXAM: MRI ABDOMEN WITHOUT AND WITH CONTRAST (INCLUDING MRCP)  TECHNIQUE: Multiplanar multisequence MR imaging of the abdomen was performed both before and after the administration of intravenous contrast. Heavily T2-weighted images of the biliary and pancreatic ducts were obtained, and three-dimensional MRCP images were rendered by post processing.  CONTRAST:  36mL MULTIHANCE GADOBENATE DIMEGLUMINE 529 MG/ML IV SOLN  COMPARISON:  CT abdomen/pelvis from 06/09/2004. Abdominal sonogram from earlier today.  FINDINGS: Lower chest:  Clear lung bases.  Hepatobiliary: Normal liver size mild diffuse hepatic steatosis. No liver masses. Mildly distended gallbladder contains numerous subcentimeter gallstones, including gallstones within the gallbladder neck. There is moderate diffuse gallbladder wall thickening, gallbladder wall mucosal hyperenhancement and trace pericholecystic fluid. No intrahepatic biliary ductal dilatation. Normal common bile duct diameter of 5 mm. On the coronal T2 weighted and MRCP images, there is 2 mm filling defect within the distal common bile duct, suggestive of a tiny nonobstructing choledocholith.  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 Otherwise normal pancreas, with no main pancreatic duct dilation and no evidence of pancreas divisum.  Spleen: Normal.  Adrenals/Urinary Tract: Normal adrenals. Normal kidneys, with no hydronephrosis. No renal lesions.  Stomach/Bowel: Collapsed and grossly normal stomach. Normal caliber visualized small and large bowel.  Vascular/Lymphatic: Mild ectasia of the infrarenal abdominal aorta, maximum diameter 2.3 cm. No abdominal lymphadenopathy. Portal, splenic and hepatic veins are patent.  Other: No ascites or focal fluid collection.  Musculoskeletal: No suspicious focal osseous lesions.  IMPRESSION: 1. Mildly distended gallbladder with cholelithiasis. Moderate diffuse gallbladder wall thickening. Gallbladder wall mucosal hyperenhancement. Trace pericholecystic fluid. MRI findings suggest acute calculous cholecystitis. 2. No intrahepatic or extrahepatic biliary ductal dilatation. Suggestion of a tiny 2 mm nonobstructing choledocholith in the distal common bile duct. 3. 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 No pancreatic duct dilation. 4. Ectatic infrarenal abdominal aorta, 2.3 cm diameter. 5. Mild diffuse hepatic steatosis.  These results were called by telephone at the time of interpretation on 08/05/2014 at 5:16 pm to [name], who verbally acknowledged these results.   Electronically Signed   By: Janice Sorrel M.George.   On: 08/05/2014 17:17    Medications:  Scheduled: . aspirin EC  81 mg Oral Daily  . levothyroxine  112 mcg Oral QAC breakfast  .  morphine injection  4 mg Intravenous Once  . pantoprazole (PROTONIX) IV  40 mg Intravenous Q24H  . piperacillin-tazobactam (ZOSYN)  IV  3.375 g Intravenous 3 times per day   Continuous: . sodium chloride 50 mL/hr at 08/06/14 1754  . dextrose 5 % and 0.9% NaCl 75 mL/hr at  08/05/14 1303    Assessment/Plan: 1) Choledocholithiasis. 2) S/p lap chole.   Overall the patient is well.   No IOC was performed, but her liver enzymes are trending downwards.  At this time I will continue to hold on performing an ERCP.  She can be followed as an outpatient with [name] upon discharge.  Her CBD stone is 2 mm and potentially it can pass spontaneously.  Plan: 1) Routine post-operative care. 2) Signing off.   LOS: 2 days   [name] 08/07/2014, 8:00 AM

## 2014-08-07 NOTE — Anesthesia Postprocedure Evaluation (Signed)
  Anesthesia Post-op Note  Patient: [name]  Procedure(s) Performed: Procedure(s): LAPAROSCOPIC CHOLECYSTECTOMY  (N/A)  Patient Location: PACU  Anesthesia Type:General  Level of Consciousness: awake  Airway and Oxygen Therapy: Patient Spontanous Breathing  Post-op Pain: mild  Post-op Assessment: Post-op Vital signs reviewed, Patient's Cardiovascular Status Stable, Respiratory Function Stable, Patent Airway, No signs of Nausea or vomiting and Pain level controlled              Post-op Vital Signs: Reviewed and stable  Last Vitals:  Filed Vitals:   08/07/14 1026  BP: 104/47  Pulse:   Temp:   Resp:     Complications: No apparent anesthesia complications

## 2014-08-07 NOTE — Care Management Important Message (Signed)
Important Message  Patient Details  Name: [name] MRN: [id number] Date of Birth: Mar 20, 1931   Medicare Important Message Given:  Yes-second notification given    Delorse Lek 08/07/2014, 3:18 PM

## 2014-08-21 ENCOUNTER — Other Ambulatory Visit: Payer: Self-pay | Admitting: Cardiology

## 2014-10-21 DIAGNOSIS — Z23 Encounter for immunization: Secondary | ICD-10-CM | POA: Diagnosis not present

## 2014-11-04 DIAGNOSIS — L720 Epidermal cyst: Secondary | ICD-10-CM | POA: Diagnosis not present

## 2014-11-04 DIAGNOSIS — L821 Other seborrheic keratosis: Secondary | ICD-10-CM | POA: Diagnosis not present

## 2014-11-14 ENCOUNTER — Encounter: Payer: Self-pay | Admitting: Cardiology

## 2014-11-14 ENCOUNTER — Ambulatory Visit (INDEPENDENT_AMBULATORY_CARE_PROVIDER_SITE_OTHER): Payer: Medicare Other | Admitting: Cardiology

## 2014-11-14 VITALS — BP 118/78 | HR 60 | Ht 63.0 in | Wt 183.0 lb

## 2014-11-14 DIAGNOSIS — I5032 Chronic diastolic (congestive) heart failure: Secondary | ICD-10-CM | POA: Diagnosis not present

## 2014-11-14 DIAGNOSIS — I350 Nonrheumatic aortic (valve) stenosis: Secondary | ICD-10-CM | POA: Diagnosis not present

## 2014-11-14 DIAGNOSIS — I471 Supraventricular tachycardia: Secondary | ICD-10-CM

## 2014-11-14 MED ORDER — POTASSIUM CHLORIDE CRYS ER 10 MEQ PO TBCR: 10.0000 meq | EXTENDED_RELEASE_TABLET | Freq: Every day | ORAL | Status: DC

## 2014-11-14 MED ORDER — FUROSEMIDE 20 MG PO TABS: 20.0000 mg | ORAL_TABLET | Freq: Every day | ORAL | Status: DC | PRN

## 2014-11-14 NOTE — Patient Instructions (Signed)

## 2014-11-14 NOTE — Progress Notes (Addendum)
Cardiology Office Note   Date:  11/14/2014   ID:  [name], DOB 1932/01/02, MRN [id number]  PCP:  Simona Huh, MD    Chief Complaint  Patient presents with  . Congestive Heart Failure  . Aortic Stenosis      History of Present Illness: [name] is a 81 y.o. female with a history of rheumatic fever as a child, moderate AS with mild AI, SVT, diastolic dysfunction with chronic diastolic CHF who presents today for followup. She is doing well. She denies any chest pain. She has chronic DOE which is stable.  She started taking her Lasix PRN. She denies any LE edema, dizziness or syncope.     Past Medical History  Diagnosis Date  . Coronary artery disease   . Hypercholesteremia   . Hypothyroidism   . Osteopenia   . PUD (peptic ulcer disease)   . Rheumatic fever   . Atypical chest pain   . CKD (chronic kidney disease), stage III   . SVT (supraventricular tachycardia) [facility]) Janice 2014    converted with vagal maneuver  . Vitamin D deficiency   . Aortic stenosis     moderate by echo 06/2013  . Diastolic dysfunction   . Cancer ([place]) 2006    SURGERY AND CHEMO AND RADIATION   . Breast cancer, right breast [facility])     Past Surgical History  Procedure Laterality Date  . Thyroid surgery      NODULE REMOVED  . Total hip arthroplasty Left 2003  . Replacement total knee Right 2009  . Breast surgery      RIGHT LUMPECTOMY AND REMOVAL OF LYMPH NODES  . Total hip arthroplasty Right 08/28/2013    Procedure: RIGHT TOTAL HIP ARTHROPLASTY ANTERIOR APPROACH;  Surgeon: Mauri Pole, MD;  Location: [facility];  Service: Orthopedics;  Laterality: Right;  . Cholecystectomy N/A 08/06/2014    Procedure: LAPAROSCOPIC CHOLECYSTECTOMY ;  Surgeon: Rolm Bookbinder, MD;  Location: [place];  Service: General;  Laterality: N/A;     Current Outpatient Prescriptions  Medication Sig Dispense Refill  . aspirin EC 81 MG tablet Take 81 mg by mouth daily.    .  Calcium Carbonate-Vitamin D (CALCIUM + D PO) Take 1 tablet by mouth daily.    . Coenzyme Q10 (COQ-10 PO) Take 1 tablet by mouth daily.     . Cyanocobalamin (VITAMIN B-12 PO) Take 1 tablet by mouth daily.    Marland Kitchen desloratadine (CLARINEX REDITAB) 5 MG disintegrating tablet Take 5 mg by mouth daily as needed (for allergy symptoms).    . fish oil-omega-3 fatty acids 1000 MG capsule Take 1 g by mouth daily.    . furosemide (LASIX) 20 MG tablet Take 1 tablet (20 mg total) by mouth daily as needed. 30 tablet 6  . lansoprazole (PREVACID) 30 MG capsule Take 30 mg by mouth daily.    Marland Kitchen levothyroxine (SYNTHROID, LEVOTHROID) 112 MCG tablet Take 112 mcg by mouth daily before breakfast.    . magnesium gluconate (MAGONATE) 500 MG tablet Take 500 mg by mouth 2 (two) times daily.     . Multiple Vitamin (MULTIVITAMIN WITH MINERALS) TABS tablet Take 1 tablet by mouth daily.    . potassium chloride (K-DUR,KLOR-CON) 10 MEQ tablet Take 1 tablet (10 mEq total) by mouth daily. 30 tablet 6  . Probiotic Product (PROBIOTIC DAILY PO) Take 1 tablet by mouth daily.     Marland Kitchen  senna-docusate (SENOKOT-S) 8.6-50 MG per tablet Take 1 tablet by mouth at bedtime as needed for mild constipation. 20 tablet 0  . Vitamin D, Ergocalciferol, (DRISDOL) 50000 UNITS CAPS capsule Take 50,000 Units by mouth every 7 (seven) days. Saturday    . [DISCONTINUED] metoprolol succinate (TOPROL-XL) 25 MG 24 hr tablet Take 1 tablet (25 mg total) by mouth daily. 30 tablet 0   No current facility-administered medications for this visit.    Allergies:   Atorvastatin; Compazine; Fenofibrate; Floxin; Livalo; Pravastatin sodium; and Simvastatin    Social History:  The patient  reports that she has never smoked. She has never used smokeless tobacco. She reports that she does not drink alcohol or use illicit drugs.   Family History:  The patient's family history includes Heart disease in her father and mother.    ROS:  Please see the history of present illness.    Otherwise, review of systems are positive for none.   All other systems are reviewed and negative.    PHYSICAL EXAM: VS:  BP 118/78 mmHg  Pulse 60  Ht 5\' 3"  (1.6 m)  Wt 183 lb (83.008 kg)  BMI 32.43 kg/m2 , BMI Body mass index is 32.43 kg/(m^2). GEN: Well nourished, well developed, in no acute distress HEENT: normal Neck: no JVD, carotid bruits, or masses Cardiac: RRR; no murmurs, rubs, or gallops,no edema  Respiratory:  clear to auscultation bilaterally, normal work of breathing GI: soft, nontender, nondistended, + BS MS: no deformity or atrophy Skin: warm and dry, no rash Neuro:  Strength and sensation are intact Psych: euthymic mood, full affect   EKG:  EKG was not ordered today.    Recent Labs: 05/14/2014: Pro B Natriuretic peptide (BNP) 90.0 08/05/2014: B Natriuretic Peptide 54.1 08/07/2014: ALT 272*; BUN 10; Creatinine, Ser 0.91; Hemoglobin 12.4; Platelets 192; Potassium 4.3; Sodium 138    Lipid Panel No results found for: CHOL, TRIG, HDL, CHOLHDL, VLDL, LDLCALC, LDLDIRECT    Wt Readings from Last 3 Encounters:  11/14/14 183 lb (83.008 kg)  08/05/14 183 lb (83.008 kg)  05/14/14 186 lb (84.369 kg)    ASSESSMENT AND PLAN:  1. SVT with no reoccurence - continue metoprolol PRN for breakthrough 2. Chronic diastolic CHF appears compensated with chronic DOE - continue Lasix daily 3. Moderate AS - will repeat echo in June 2017 4. SOB that is chronic and stable     Current medicines are reviewed at length with the patient today.  The patient does not have concerns regarding medicines.  The following changes have been made:  no change  Labs/ tests ordered today: See above Assessment and Plan No orders of the defined types were placed in this encounter.     Disposition:   FU with me in 6 months  Signed, Sueanne Margarita, MD  11/14/2014 9:21 AM    [facility] [place], [place], [place]  [address] Phone: [phone]; Fax: [phone]

## 2014-11-19 DIAGNOSIS — D2312 Other benign neoplasm of skin of left eyelid, including canthus: Secondary | ICD-10-CM | POA: Diagnosis not present

## 2014-11-19 DIAGNOSIS — D485 Neoplasm of uncertain behavior of skin: Secondary | ICD-10-CM | POA: Diagnosis not present

## 2014-12-02 DIAGNOSIS — E7800 Pure hypercholesterolemia, unspecified: Secondary | ICD-10-CM | POA: Diagnosis not present

## 2014-12-02 DIAGNOSIS — E039 Hypothyroidism, unspecified: Secondary | ICD-10-CM | POA: Diagnosis not present

## 2014-12-02 DIAGNOSIS — N183 Chronic kidney disease, stage 3 (moderate): Secondary | ICD-10-CM | POA: Diagnosis not present

## 2014-12-02 DIAGNOSIS — E559 Vitamin D deficiency, unspecified: Secondary | ICD-10-CM | POA: Diagnosis not present

## 2014-12-02 DIAGNOSIS — R05 Cough: Secondary | ICD-10-CM | POA: Diagnosis not present

## 2014-12-15 ENCOUNTER — Other Ambulatory Visit: Payer: Self-pay | Admitting: Cardiology

## 2014-12-20 ENCOUNTER — Other Ambulatory Visit: Payer: Self-pay | Admitting: Cardiology

## 2015-05-01 DIAGNOSIS — H25013 Cortical age-related cataract, bilateral: Secondary | ICD-10-CM | POA: Diagnosis not present

## 2015-05-01 DIAGNOSIS — H40033 Anatomical narrow angle, bilateral: Secondary | ICD-10-CM | POA: Diagnosis not present

## 2015-05-01 DIAGNOSIS — Z0100 Encounter for examination of eyes and vision without abnormal findings: Secondary | ICD-10-CM | POA: Diagnosis not present

## 2015-05-01 DIAGNOSIS — H2513 Age-related nuclear cataract, bilateral: Secondary | ICD-10-CM | POA: Diagnosis not present

## 2015-05-08 ENCOUNTER — Encounter: Payer: Self-pay | Admitting: Cardiology

## 2015-05-08 ENCOUNTER — Ambulatory Visit (INDEPENDENT_AMBULATORY_CARE_PROVIDER_SITE_OTHER): Payer: Medicare Other | Admitting: Cardiology

## 2015-05-08 VITALS — BP 110/68 | HR 80 | Ht 63.0 in | Wt 191.4 lb

## 2015-05-08 DIAGNOSIS — R531 Weakness: Secondary | ICD-10-CM | POA: Diagnosis not present

## 2015-05-08 DIAGNOSIS — I471 Supraventricular tachycardia: Secondary | ICD-10-CM | POA: Diagnosis not present

## 2015-05-08 DIAGNOSIS — I350 Nonrheumatic aortic (valve) stenosis: Secondary | ICD-10-CM

## 2015-05-08 DIAGNOSIS — I5032 Chronic diastolic (congestive) heart failure: Secondary | ICD-10-CM

## 2015-05-08 NOTE — Progress Notes (Addendum)
Cardiology Office Note    Date:  05/08/2015   ID:  CALEA HEMPSTEAD, DOB 01-21-1931, MRN [id number]  PCP:  Simona Huh, MD  Cardiologist:  Sueanne Margarita, MD   Chief Complaint  Patient presents with  . Aortic Stenosis    History of Present Illness:  MERL ANCELET is a 82 y.o. female with a history of rheumatic fever as a child, moderate AS with mild AI, SVT, diastolic dysfunction with chronic diastolic CHF who presents today for followup. She is doing well but has felt weak and tired for the past few days with activity..  She denies any chest pain. She has chronic DOE which is fairly stable. She started taking her Lasix PRN. She denies any LE edema, dizziness or syncope. She has noticed some skipping of her heart beat now in the am which is new.      Past Medical History  Diagnosis Date  . Hypercholesteremia   . Hypothyroidism   . Osteopenia   . PUD (peptic ulcer disease)   . Rheumatic fever   . Atypical chest pain   . CKD (chronic kidney disease), stage III   . SVT (supraventricular tachycardia) [facility]) October 2014    converted with vagal maneuver  . Vitamin D deficiency   . Aortic stenosis     moderate by echo 06/2013  . Diastolic dysfunction   . Cancer ([place]) 2006    SURGERY AND CHEMO AND RADIATION   . Breast cancer, right breast [facility])     Past Surgical History  Procedure Laterality Date  . Thyroid surgery      NODULE REMOVED  . Total hip arthroplasty Left 2003  . Replacement total knee Right 2009  . Breast surgery      RIGHT LUMPECTOMY AND REMOVAL OF LYMPH NODES  . Total hip arthroplasty Right 08/28/2013    Procedure: RIGHT TOTAL HIP ARTHROPLASTY ANTERIOR APPROACH;  Surgeon: Mauri Pole, MD;  Location: [facility];  Service: Orthopedics;  Laterality: Right;  . Cholecystectomy N/A 08/06/2014    Procedure: LAPAROSCOPIC CHOLECYSTECTOMY ;  Surgeon: Rolm Bookbinder, MD;  Location: [place];  Service: General;  Laterality: N/A;    Current  Medications: Outpatient Prescriptions Prior to Visit  Medication Sig Dispense Refill  . aspirin EC 81 MG tablet Take 81 mg by mouth daily.    . Calcium Carbonate-Vitamin D (CALCIUM + D PO) Take 1 tablet by mouth daily.    . Coenzyme Q10 (COQ-10 PO) Take 1 tablet by mouth daily.     . Cyanocobalamin (VITAMIN B-12 PO) Take 1 tablet by mouth daily.    Marland Kitchen desloratadine (CLARINEX REDITAB) 5 MG disintegrating tablet Take 5 mg by mouth daily as needed (for allergy symptoms).    . fish oil-omega-3 fatty acids 1000 MG capsule Take 1 g by mouth daily.    . lansoprazole (PREVACID) 30 MG capsule Take 30 mg by mouth daily.    Marland Kitchen levothyroxine (SYNTHROID, LEVOTHROID) 112 MCG tablet Take 112 mcg by mouth daily before breakfast.    . magnesium gluconate (MAGONATE) 500 MG tablet Take 500 mg by mouth 2 (two) times daily.     . Multiple Vitamin (MULTIVITAMIN WITH MINERALS) TABS tablet Take 1 tablet by mouth daily.    . potassium chloride (K-DUR,KLOR-CON) 10 MEQ tablet Take 1 tablet (10 mEq total) by mouth daily. 30 tablet 6  . Probiotic Product (PROBIOTIC DAILY PO) Take 1 tablet by mouth daily.     . Vitamin D, Ergocalciferol, (DRISDOL) 50000 UNITS CAPS  capsule Take 50,000 Units by mouth every 7 (seven) days. Saturday    . furosemide (LASIX) 20 MG tablet Take 1 tablet (20 mg total) by mouth daily as needed. 30 tablet 6  . furosemide (LASIX) 20 MG tablet TAKE 1 TABLET BY MOUTH EVERY DAY AS NEEDED FOR FLUID 30 tablet 10  . senna-docusate (SENOKOT-S) 8.6-50 MG per tablet Take 1 tablet by mouth at bedtime as needed for mild constipation. 20 tablet 0   No facility-administered medications prior to visit.     Allergies:   Atorvastatin; Compazine; Fenofibrate; Floxin; Livalo; Pravastatin sodium; and Simvastatin   Social History   Social History  . Marital Status: Widowed    Spouse Name: N/A  . Number of Children: N/A  . Years of Education: N/A   Social History Main Topics  . Smoking status: Never Smoker   .  Smokeless tobacco: Never Used  . Alcohol Use: No  . Drug Use: No  . Sexual Activity: Not Asked   Other Topics Concern  . None   Social History Narrative     Family History:  The patient's family history includes Heart disease in her father and mother.   ROS:   Please see the history of present illness.    ROS All other systems reviewed and are negative.   PHYSICAL EXAM:   VS:  BP 110/68 mmHg  Pulse 80  Ht 5\' 3"  (1.6 m)  Wt 191 lb 6.4 oz (86.818 kg)  BMI 33.91 kg/m2   GEN: Well nourished, well developed, in no acute distress HEENT: normal Neck: no JVD, carotid bruits, or masses Cardiac: RRR; no murmurs, rubs, or gallops,no edema.  Intact distal pulses bilaterally.  Respiratory:  clear to auscultation bilaterally, normal work of breathing GI: soft, nontender, nondistended, + BS MS: no deformity or atrophy Skin: warm and dry, no rash Neuro:  Alert and Oriented x 3, Strength and sensation are intact Psych: euthymic mood, full affect  Wt Readings from Last 3 Encounters:  05/08/15 191 lb 6.4 oz (86.818 kg)  11/14/14 183 lb (83.008 kg)  08/05/14 183 lb (83.008 kg)      Studies/Labs Reviewed:   EKG:  EKG is not ordered today.    Recent Labs: 05/14/2014: Pro B Natriuretic peptide (BNP) 90.0 08/05/2014: B Natriuretic Peptide 54.1 08/07/2014: ALT 272*; BUN 10; Creatinine, Ser 0.91; Hemoglobin 12.4; Platelets 192; Potassium 4.3; Sodium 138   Lipid Panel No results found for: CHOL, TRIG, HDL, CHOLHDL, VLDL, LDLCALC, LDLDIRECT  Additional studies/ records that were reviewed today include:  None    ASSESSMENT:    1. Aortic stenosis   2. SVT (supraventricular tachycardia) ([place])   3. Chronic diastolic heart failure ([place])   4. Weakness      PLAN:  In order of problems listed above:  1. Moderate AS - she has been having some exertional weakness lately so need to make sure she has not had progression of her AS.  Repeat echo to assess for progression. 2. SVT with no  reocurrence but she has had some palpitations.  I will get a 30 day heart monitor to make sure she is not having PAF. 3. Chronic diastolic CHF - she appears euvolemic on exam.  Continue lasix and check BMET.   4. Weakness - this occurs with exertion.  Her BP is stable.  If stress test shows stability of her moderate AS then I will get a nuclear stress test to rule out ischemia.  I have asked her to check her  BP when she gets a weak spell and let me know what it is running.    Medication Adjustments/Labs and Tests Ordered: Current medicines are reviewed at length with the patient today.  Concerns regarding medicines are outlined above.  Medication changes, Labs and Tests ordered today are listed in the Patient Instructions below.  Patient Instructions  Medication Instructions:  Your physician recommends that you continue on your current medications as directed. Please refer to the Current Medication list given to you today.   Labwork: None  Testing/Procedures: Your physician has requested that you have an echocardiogram. Echocardiography is a painless test that uses sound waves to create images of your heart. It provides your doctor with information about the size and shape of your heart and how well your heart's chambers and valves are working. This procedure takes approximately one hour. There are no restrictions for this procedure.  Follow-Up: Your physician wants you to follow-up in: 6 months with Dr. Radford Pax. You will receive a reminder letter in the mail two months in advance. If you don't receive a letter, please call our office to schedule the follow-up appointment.   Any Other Special Instructions Will Be Listed Below (If Applicable).     If you need a refill on your cardiac medications before your next appointment, please call your pharmacy.       Lurena Nida, MD  05/08/2015 9:26 AM    [facility] [place] [address], [place], [place]   [address] Phone: [phone]; Fax: [phone]

## 2015-05-08 NOTE — Patient Instructions (Signed)
Medication Instructions:  Your physician recommends that you continue on your current medications as directed. Please refer to the Current Medication list given to you today.   Labwork: None  Testing/Procedures: Your physician has requested that you have an echocardiogram. Echocardiography is a painless test that uses sound waves to create images of your heart. It provides your doctor with information about the size and shape of your heart and how well your heart's chambers and valves are working. This procedure takes approximately one hour. There are no restrictions for this procedure.  Follow-Up: Your physician wants you to follow-up in: 6 months with Dr. Turner. You will receive a reminder letter in the mail two months in advance. If you don't receive a letter, please call our office to schedule the follow-up appointment.   Any Other Special Instructions Will Be Listed Below (If Applicable).     If you need a refill on your cardiac medications before your next appointment, please call your pharmacy.   

## 2015-05-12 ENCOUNTER — Ambulatory Visit (HOSPITAL_COMMUNITY): Payer: Medicare Other | Attending: Internal Medicine

## 2015-05-12 ENCOUNTER — Other Ambulatory Visit: Payer: Self-pay

## 2015-05-12 DIAGNOSIS — I5032 Chronic diastolic (congestive) heart failure: Secondary | ICD-10-CM | POA: Diagnosis not present

## 2015-05-12 DIAGNOSIS — I2510 Atherosclerotic heart disease of native coronary artery without angina pectoris: Secondary | ICD-10-CM | POA: Insufficient documentation

## 2015-05-12 DIAGNOSIS — I517 Cardiomegaly: Secondary | ICD-10-CM | POA: Insufficient documentation

## 2015-05-12 DIAGNOSIS — I350 Nonrheumatic aortic (valve) stenosis: Secondary | ICD-10-CM | POA: Diagnosis not present

## 2015-05-12 DIAGNOSIS — Z6833 Body mass index (BMI) 33.0-33.9, adult: Secondary | ICD-10-CM | POA: Diagnosis not present

## 2015-05-12 DIAGNOSIS — E669 Obesity, unspecified: Secondary | ICD-10-CM | POA: Insufficient documentation

## 2015-05-12 DIAGNOSIS — I351 Nonrheumatic aortic (valve) insufficiency: Secondary | ICD-10-CM | POA: Insufficient documentation

## 2015-05-12 DIAGNOSIS — E785 Hyperlipidemia, unspecified: Secondary | ICD-10-CM | POA: Diagnosis not present

## 2015-05-13 ENCOUNTER — Telehealth: Payer: Self-pay | Admitting: Cardiology

## 2015-05-13 DIAGNOSIS — I5032 Chronic diastolic (congestive) heart failure: Secondary | ICD-10-CM

## 2015-05-13 DIAGNOSIS — I350 Nonrheumatic aortic (valve) stenosis: Secondary | ICD-10-CM

## 2015-05-13 DIAGNOSIS — R531 Weakness: Secondary | ICD-10-CM

## 2015-05-13 DIAGNOSIS — I471 Supraventricular tachycardia: Secondary | ICD-10-CM

## 2015-05-13 NOTE — Telephone Encounter (Signed)
Informed patient of results and verbal understanding expressed.  Repeat ECHO ordered to be scheduled in 1 year. Patient agrees to stress myoview, instructions reviewed, and test ordered for scheduling. Patient agrees with treatment plan.

## 2015-05-13 NOTE — Telephone Encounter (Signed)
Follow Up:; ° ° °Returning your call. °

## 2015-05-13 NOTE — Telephone Encounter (Signed)
-----   Message from Sueanne Margarita, MD sent at 05/12/2015  7:48 PM EDT ----- Please let patient know that echo showed normal LVF with mild LVH and moderate AS with mild AR.  Repeat echo in 1 year for AS

## 2015-05-20 DIAGNOSIS — H25012 Cortical age-related cataract, left eye: Secondary | ICD-10-CM | POA: Diagnosis not present

## 2015-05-20 DIAGNOSIS — H25812 Combined forms of age-related cataract, left eye: Secondary | ICD-10-CM | POA: Diagnosis not present

## 2015-05-20 DIAGNOSIS — H2512 Age-related nuclear cataract, left eye: Secondary | ICD-10-CM | POA: Diagnosis not present

## 2015-06-03 DIAGNOSIS — H25011 Cortical age-related cataract, right eye: Secondary | ICD-10-CM | POA: Diagnosis not present

## 2015-06-03 DIAGNOSIS — H2511 Age-related nuclear cataract, right eye: Secondary | ICD-10-CM | POA: Diagnosis not present

## 2015-06-03 DIAGNOSIS — H25811 Combined forms of age-related cataract, right eye: Secondary | ICD-10-CM | POA: Diagnosis not present

## 2015-06-26 ENCOUNTER — Encounter: Payer: Self-pay | Admitting: Physician Assistant

## 2015-06-26 DIAGNOSIS — E039 Hypothyroidism, unspecified: Secondary | ICD-10-CM | POA: Diagnosis not present

## 2015-06-26 DIAGNOSIS — N183 Chronic kidney disease, stage 3 (moderate): Secondary | ICD-10-CM | POA: Diagnosis not present

## 2015-06-26 DIAGNOSIS — E559 Vitamin D deficiency, unspecified: Secondary | ICD-10-CM | POA: Diagnosis not present

## 2015-06-26 DIAGNOSIS — I062 Rheumatic aortic stenosis with insufficiency: Secondary | ICD-10-CM | POA: Diagnosis not present

## 2015-06-26 DIAGNOSIS — M899 Disorder of bone, unspecified: Secondary | ICD-10-CM | POA: Diagnosis not present

## 2015-06-26 DIAGNOSIS — Z853 Personal history of malignant neoplasm of breast: Secondary | ICD-10-CM | POA: Diagnosis not present

## 2015-07-09 ENCOUNTER — Telehealth (HOSPITAL_COMMUNITY): Payer: Self-pay | Admitting: Radiology

## 2015-07-09 ENCOUNTER — Telehealth (HOSPITAL_COMMUNITY): Payer: Self-pay | Admitting: *Deleted

## 2015-07-09 NOTE — Telephone Encounter (Signed)
Left message on voicemail in reference to upcoming appointment scheduled for 07/14/15. Phone number given for a call back so details instructions can be given. Hubbard Robinson, RN

## 2015-07-09 NOTE — Telephone Encounter (Signed)
Given instructions ;  Will arrive @9 :45 Am.  AY

## 2015-07-14 ENCOUNTER — Ambulatory Visit (HOSPITAL_COMMUNITY): Payer: Medicare Other | Attending: Cardiology

## 2015-07-14 DIAGNOSIS — R0609 Other forms of dyspnea: Secondary | ICD-10-CM | POA: Diagnosis not present

## 2015-07-14 DIAGNOSIS — I471 Supraventricular tachycardia, unspecified: Secondary | ICD-10-CM

## 2015-07-14 DIAGNOSIS — R531 Weakness: Secondary | ICD-10-CM

## 2015-07-14 DIAGNOSIS — R9439 Abnormal result of other cardiovascular function study: Secondary | ICD-10-CM | POA: Insufficient documentation

## 2015-07-14 DIAGNOSIS — I5032 Chronic diastolic (congestive) heart failure: Secondary | ICD-10-CM | POA: Diagnosis not present

## 2015-07-14 LAB — MYOCARDIAL PERFUSION IMAGING
CHL CUP MPHR: 139 {beats}/min
CHL CUP NUCLEAR SDS: 2
CHL CUP NUCLEAR SRS: 2
CSEPHR: 101 %
Estimated workload: 4.6 METS
Exercise duration (min): 4 min
Exercise duration (sec): 0 s
LHR: 0.38
LV sys vol: 34 mL
LVDIAVOL: 89 mL (ref 46–106)
Peak HR: 141 {beats}/min
Rest HR: 65 {beats}/min
SSS: 4
TID: 0.99

## 2015-07-14 MED ORDER — TECHNETIUM TC 99M TETROFOSMIN IV KIT
10.5000 | PACK | Freq: Once | INTRAVENOUS | Status: AC | PRN
  Administered 2015-07-14: 11 via INTRAVENOUS
  Filled 2015-07-14: qty 11

## 2015-07-14 MED ORDER — TECHNETIUM TC 99M TETROFOSMIN IV KIT
32.3000 | PACK | Freq: Once | INTRAVENOUS | Status: AC | PRN
  Administered 2015-07-14: 32.3 via INTRAVENOUS
  Filled 2015-07-14: qty 32

## 2015-07-15 ENCOUNTER — Telehealth: Payer: Self-pay | Admitting: Cardiology

## 2015-07-15 NOTE — Telephone Encounter (Signed)
Patient had myoview done 7/10. She reports after the test yesterday she was more SOB than usual and tired. She took her Lasix in the evening (she held it for stress test). Today she states she feels much better and that her SOB is back to baseline prior to stress test.  Informed patient of abnormal stress test results. Scheduled patient for evaluation 7/13 with Estella Husk to discuss heart catheterization.

## 2015-07-15 NOTE — Telephone Encounter (Signed)
New message     Pt verbalized since she has came into the office and had the test done she has been feeling very weak and tired

## 2015-07-17 ENCOUNTER — Encounter: Payer: Self-pay | Admitting: *Deleted

## 2015-07-17 ENCOUNTER — Encounter: Payer: Self-pay | Admitting: Physician Assistant

## 2015-07-17 ENCOUNTER — Ambulatory Visit (INDEPENDENT_AMBULATORY_CARE_PROVIDER_SITE_OTHER): Payer: Medicare Other | Admitting: Physician Assistant

## 2015-07-17 ENCOUNTER — Encounter (INDEPENDENT_AMBULATORY_CARE_PROVIDER_SITE_OTHER): Payer: Self-pay

## 2015-07-17 VITALS — BP 122/78 | HR 68 | Ht 63.0 in | Wt 191.0 lb

## 2015-07-17 DIAGNOSIS — R9439 Abnormal result of other cardiovascular function study: Secondary | ICD-10-CM | POA: Diagnosis not present

## 2015-07-17 DIAGNOSIS — I471 Supraventricular tachycardia: Secondary | ICD-10-CM

## 2015-07-17 DIAGNOSIS — I5032 Chronic diastolic (congestive) heart failure: Secondary | ICD-10-CM | POA: Diagnosis not present

## 2015-07-17 DIAGNOSIS — I350 Nonrheumatic aortic (valve) stenosis: Secondary | ICD-10-CM

## 2015-07-17 DIAGNOSIS — R0609 Other forms of dyspnea: Secondary | ICD-10-CM

## 2015-07-17 LAB — BASIC METABOLIC PANEL
BUN: 21 mg/dL (ref 7–25)
CO2: 27 mmol/L (ref 20–31)
Calcium: 9.3 mg/dL (ref 8.6–10.4)
Chloride: 102 mmol/L (ref 98–110)
Creat: 1 mg/dL — ABNORMAL HIGH (ref 0.60–0.88)
Glucose, Bld: 102 mg/dL — ABNORMAL HIGH (ref 65–99)
POTASSIUM: 4.5 mmol/L (ref 3.5–5.3)
SODIUM: 139 mmol/L (ref 135–146)

## 2015-07-17 LAB — CBC WITH DIFFERENTIAL/PLATELET
BASOS PCT: 0 %
Basophils Absolute: 0 cells/uL (ref 0–200)
Eosinophils Absolute: 300 cells/uL (ref 15–500)
Eosinophils Relative: 6 %
HCT: 42.8 % (ref 35.0–45.0)
HEMOGLOBIN: 14.5 g/dL (ref 11.7–15.5)
LYMPHS ABS: 1400 {cells}/uL (ref 850–3900)
Lymphocytes Relative: 28 %
MCH: 31.8 pg (ref 27.0–33.0)
MCHC: 33.9 g/dL (ref 32.0–36.0)
MCV: 93.9 fL (ref 80.0–100.0)
MONO ABS: 450 {cells}/uL (ref 200–950)
MPV: 9.3 fL (ref 7.5–12.5)
Monocytes Relative: 9 %
Neutro Abs: 2850 cells/uL (ref 1500–7800)
Neutrophils Relative %: 57 %
Platelets: 256 10*3/uL (ref 140–400)
RBC: 4.56 MIL/uL (ref 3.80–5.10)
RDW: 13.6 % (ref 11.0–15.0)
WBC: 5 10*3/uL (ref 3.8–10.8)

## 2015-07-17 LAB — PROTIME-INR
INR: 1
PROTHROMBIN TIME: 10.9 s (ref 9.0–11.5)

## 2015-07-17 NOTE — Progress Notes (Signed)
Cardiology Office Note    Date:  07/17/2015   ID:  [name], DOB [birth year], MRN [id number]  PCP:  Simona Huh, MD  Cardiologist:  Dr. Radford Pax  Chief complaint exertional weakness here to be scheduled for cath  History of Present Illness:  [name] is a 82 y.o. female with a history of rheumatic fever as a child, moderate AS with mild AI, SVT, diastolic dysfunction with chronic diastolic CHF. She saw Dr. Radford Pax 05/2015 and was complaining of his exertional weakness but no chest pain. Repeat 2-D echo showed moderate AS.nuclear stress test was consistent with ischemia in the basal inferolateral region. It was an intermediate risk study with the medium defect of mild severity. Right and left heart catheterization was recommended and she is here today to discuss. She was also having palpitations and 30 day monitor was placed.  Patient comes in today accompanied by her son-in-law. Patient complains of chronic dyspnea on exertion that slows her down but she never has to stop. She says she got very short of breath on the stress test. She denies any chest pain, dizziness or presyncope. She does have occasional palpitations that usually goes away with coughing. It occurs early in the morning. She hasn't had any for over a week.     Past Medical History  Diagnosis Date  . Hypercholesteremia   . Hypothyroidism   . Osteopenia   . PUD (peptic ulcer disease)   . Rheumatic fever   . Atypical chest pain   . CKD (chronic kidney disease), stage III   . SVT (supraventricular tachycardia) [facility]) October 2014    converted with vagal maneuver  . Vitamin D deficiency   . Aortic stenosis     moderate by echo 06/2013  . Diastolic dysfunction   . Cancer ([place]) 2006    SURGERY AND CHEMO AND RADIATION   . Breast cancer, right breast [facility])     Past Surgical History  Procedure Laterality Date  . Thyroid surgery      NODULE REMOVED  . Total hip arthroplasty Left 2003  . Replacement  total knee Right 2009  . Breast surgery      RIGHT LUMPECTOMY AND REMOVAL OF LYMPH NODES  . Total hip arthroplasty Right 08/28/2013    Procedure: RIGHT TOTAL HIP ARTHROPLASTY ANTERIOR APPROACH;  Surgeon: Mauri Pole, MD;  Location: [facility];  Service: Orthopedics;  Laterality: Right;  . Cholecystectomy N/A 08/06/2014    Procedure: LAPAROSCOPIC CHOLECYSTECTOMY ;  Surgeon: Rolm Bookbinder, MD;  Location: [facility];  Service: General;  Laterality: N/A;    Current Medications: Outpatient Prescriptions Prior to Visit  Medication Sig Dispense Refill  . aspirin EC 81 MG tablet Take 81 mg by mouth daily.    . Calcium Carbonate-Vitamin D (CALCIUM + D PO) Take 1 tablet by mouth daily.    . Coenzyme Q10 (COQ-10 PO) Take 1 tablet by mouth daily.     . Cyanocobalamin (VITAMIN B-12 PO) Take 1 tablet by mouth daily.    Marland Kitchen desloratadine (CLARINEX REDITAB) 5 MG disintegrating tablet Take 5 mg by mouth daily as needed (for allergy symptoms).    . fish oil-omega-3 fatty acids 1000 MG capsule Take 1 g by mouth daily.    . furosemide (LASIX) 20 MG tablet Take 20 mg by mouth daily.    . lansoprazole (PREVACID) 30 MG capsule Take 30 mg by mouth daily.    Marland Kitchen levothyroxine (SYNTHROID, LEVOTHROID) 112 MCG tablet Take 112 mcg by mouth daily  before breakfast.    . magnesium gluconate (MAGONATE) 500 MG tablet Take 500 mg by mouth 2 (two) times daily.     . Multiple Vitamin (MULTIVITAMIN WITH MINERALS) TABS tablet Take 1 tablet by mouth daily.    . potassium chloride (K-DUR,KLOR-CON) 10 MEQ tablet Take 1 tablet (10 mEq total) by mouth daily. 30 tablet 6  . Probiotic Product (PROBIOTIC DAILY PO) Take 1 tablet by mouth daily.     . Vitamin D, Ergocalciferol, (DRISDOL) 50000 UNITS CAPS capsule Take 50,000 Units by mouth every 7 (seven) days. Saturday     No facility-administered medications prior to visit.     Allergies:   Atorvastatin; Compazine; Fenofibrate; Floxin; Livalo; Pravastatin sodium; and Simvastatin   Social  History   Social History  . Marital Status: Widowed    Spouse Name: N/A  . Number of Children: N/A  . Years of Education: N/A   Social History Main Topics  . Smoking status: Never Smoker   . Smokeless tobacco: Never Used  . Alcohol Use: No  . Drug Use: No  . Sexual Activity: Not Asked   Other Topics Concern  . None   Social History Narrative     Family History:  The patient's family history includes Heart disease in her father and mother.   ROS:   Please see the history of present illness.    Review of Systems  Constitution: Negative.  HENT: Negative.   Eyes: Negative.   Cardiovascular: Positive for dyspnea on exertion and palpitations.  Respiratory: Positive for cough and shortness of breath.   Hematologic/Lymphatic: Negative.   Musculoskeletal: Negative.  Negative for joint pain.  Gastrointestinal: Negative.   Genitourinary: Negative.   Neurological: Negative.    All other systems reviewed and are negative.   PHYSICAL EXAM:   VS:  BP 122/78 mmHg  Pulse 68  Ht 5\' 3"  (1.6 m)  Wt 191 lb (86.637 kg)  BMI 33.84 kg/m2   GEN: Well nourished, well developed, in no acute distress HEENT: normal Neck: Murmur portrayed in the carotids, no JVD, carotid bruits, or masses Cardiac:  RRR; 99991111 harsh systolic murmur at the left sternal border and right sternal border into her carotids, no rubs, or gallops,no edema  Respiratory:  clear to auscultation bilaterally, normal work of breathing GI: soft, nontender, nondistended, + BS MS: no deformity or atrophy Skin: warm and dry, no rash Neuro:  Alert and Oriented x 3, Strength and sensation are intact Psych: euthymic mood, full affect  Wt Readings from Last 3 Encounters:  07/17/15 191 lb (86.637 kg)  07/14/15 191 lb (86.637 kg)  05/08/15 191 lb 6.4 oz (86.818 kg)      Studies/Labs Reviewed:   EKG:  EKG is  ordered today.  The ekg ordered today demonstrates Normal sinus rhythm, normal EKG  Recent Labs: 08/05/2014: B  Natriuretic Peptide 54.1 08/07/2014: ALT 272*; BUN 10; Creatinine, Ser 0.91; Hemoglobin 12.4; Platelets 192; Potassium 4.3; Sodium 138   Lipid Panel No results found for: CHOL, TRIG, HDL, CHOLHDL, VLDL, LDLCALC, LDLDIRECT  Additional studies/ records that were reviewed today include:  Nuclear stress test 07/2015  Nuclear stress EF: 61%. No wall motion abnormalities  There was no ST segment deviation noted during stress.  Defect 1: There is a medium defect of mild severity present in the basal inferolateral location.  Findings consistent with ischemia.  This is an intermediate risk study.   2-D echo 05/2015 Study Conclusions   - Left ventricle: The cavity size was normal.  Wall thickness was   increased in a pattern of mild LVH. Systolic function was normal.   The estimated ejection fraction was in the range of 60% to 65%.   The study is not technically sufficient to allow evaluation of LV   diastolic function. - Aortic valve: AV is thickened, calcified with restricted motion   Peak and mean gradients through the valve are 50 and 31 mm Hg   respectively consistent with moderate AS> There was mild   regurgitation. - Mitral valve: MV is mildly thickened with minimally restricted   motion. - Left atrium: The atrium was moderately dilated.    ASSESSMENT:    1. Chronic diastolic heart failure ([place])   2. SVT (supraventricular tachycardia) ([place])   3. Abnormal stress test   4. Aortic stenosis      PLAN:  In order of problems listed above:  Chronic diastolic heart failure compensated  SVT: Palpitations seem to be controlled. There was discussion about placing an event monitor but she is not having daily palpitations and would prefer to wait until after the heart catheterization.  Abnormal stress test with basal inferior lateral ischemia and chronic dyspnea on exertion. Dr. Radford Pax recommends right and left heart catheterization to further assess ischemia and aortic stenosis. I  have reviewed the risks, indications, and alternatives to angioplasty and stenting with the patient. Risks include but are not limited to bleeding, infection, vascular injury, stroke, myocardial infection, arrhythmia, kidney injury, radiation-related injury in the case of prolonged fluoroscopy use, emergency cardiac surgery, and death. The patient understands the risks of serious complication is low (123456) and he agrees to proceed.   Aortic stenosis moderate by recent 2-D echo for right heart catheterization tomorrow  Dyspnea on exertion rule out secondary to ischemia with cardiac catheterization    Medication Adjustments/Labs and Tests Ordered: Current medicines are reviewed at length with the patient today.  Concerns regarding medicines are outlined above.  Medication changes, Labs and Tests ordered today are listed in the Patient Instructions below. Patient Instructions  Medication Instructions:  Your physician recommends that you continue on your current medications as directed. Please refer to the Current Medication list given to you today.   Labwork: TODAY:  BMET, CBC W/DIFF & PT/INR  Testing/Procedures: Your physician has requested that you have a cardiac catheterization. Cardiac catheterization is used to diagnose and/or treat various heart conditions. Doctors may recommend this procedure for a number of different reasons. The most common reason is to evaluate chest pain. Chest pain can be a symptom of coronary artery disease (CAD), and cardiac catheterization can show whether plaque is narrowing or blocking your heart's arteries. This procedure is also used to evaluate the valves, as well as measure the blood flow and oxygen levels in different parts of your heart. For further information please visit [email or web address].[email or web address]. Please follow instruction sheet, as given.    Follow-Up: Your physician recommends that you schedule a follow-up appointment in: WILL BE SET UP AT [place]   Any Other Special Instructions Will Be Listed Below (If Applicable). Coronary Angiogram A coronary angiogram, also called coronary angiography, is an X-ray procedure used to look at the arteries in the heart. In this procedure, a dye (contrast dye) is injected through a long, hollow tube (catheter). The catheter is about the size of a piece of cooked spaghetti and is inserted through your groin, wrist, or arm. The dye is injected into each artery, and X-rays are then taken to show if there  is a blockage in the arteries of your heart. LET [facility] CARE PROVIDER KNOW ABOUT:  Any allergies you have, including allergies to shellfish or contrast dye.   All medicines you are taking, including vitamins, herbs, eye drops, creams, and over-the-counter medicines.   Previous problems you or members of your family have had with the use of anesthetics.   Any blood disorders you have.   Previous surgeries you have had.  History of kidney problems or failure.   Other medical conditions you have. RISKS AND COMPLICATIONS  Generally, a coronary angiogram is a safe procedure. However, problems can occur and include:  Allergic reaction to the dye.  Bleeding from the access site or other locations.  Kidney injury, especially in people with impaired kidney function.  Stroke (rare).  Heart attack (rare). BEFORE THE PROCEDURE   Do not eat or drink anything after midnight the night before the procedure or as directed by your health care provider.   Ask your health care provider about changing or stopping your regular medicines. This is especially important if you are taking diabetes medicines or blood thinners. PROCEDURE  You may be given a medicine to help you relax (sedative) before the procedure. This medicine is given through an intravenous (IV) access tube that is inserted into one of your veins.   The area where the catheter will be inserted will be washed and shaved.  This is usually done in the groin but may be done in the fold of your arm (near your elbow) or in the wrist.   A medicine will be given to numb the area where the catheter will be inserted (local anesthetic).   The health care provider will insert the catheter into an artery. The catheter will be guided by using a special type of X-ray (fluoroscopy) of the blood vessel being examined.   A special dye will then be injected into the catheter, and X-rays will be taken. The dye will help to show where any narrowing or blockages are located in the heart arteries.  AFTER THE PROCEDURE   If the procedure is done through the leg, you will be kept in bed lying flat for several hours. You will be instructed to not bend or cross your legs.  The insertion site will be checked frequently.   The pulse in your feet or wrist will be checked frequently.   Additional blood tests, X-rays, and an electrocardiogram may be done.    This information is not intended to replace advice given to you by your health care provider. Make sure you discuss any questions you have with your health care provider.   Document Released: 06/27/2002 Document Revised: 01/11/2014 Document Reviewed: 05/15/2012 Elsevier Interactive Patient Education [employer].     If you need a refill on your cardiac medications before your next appointment, please call your pharmacy.       Sumner Boast, PA-C  07/17/2015 9:26 AM    [facility] [place], [place], [place]  [address] Phone: [phone]; Fax: [phone]

## 2015-07-17 NOTE — Patient Instructions (Signed)
Medication Instructions:  Your physician recommends that you continue on your current medications as directed. Please refer to the Current Medication list given to you today.   Labwork: TODAY:  BMET, CBC W/DIFF & PT/INR  Testing/Procedures: Your physician has requested that you have a cardiac catheterization. Cardiac catheterization is used to diagnose and/or treat various heart conditions. Doctors may recommend this procedure for a number of different reasons. The most common reason is to evaluate chest pain. Chest pain can be a symptom of coronary artery disease (CAD), and cardiac catheterization can show whether plaque is narrowing or blocking your heart's arteries. This procedure is also used to evaluate the valves, as well as measure the blood flow and oxygen levels in different parts of your heart. For further information please visit [email or web address][employer][email or web address]. Please follow instruction sheet, as given.    Follow-Up: Your physician recommends that you schedule a follow-up appointment in: WILL BE SET UP AT [place]   Any Other Special Instructions Will Be Listed Below (If Applicable). Coronary Angiogram A coronary angiogram, also called coronary angiography, is an X-ray procedure used to look at the arteries in the heart. In this procedure, a dye (contrast dye) is injected through a long, hollow tube (catheter). The catheter is about the size of a piece of cooked spaghetti and is inserted through your groin, wrist, or arm. The dye is injected into each artery, and X-rays are then taken to show if there is a blockage in the arteries of your heart. LET [facility] CARE PROVIDER KNOW ABOUT:  Any allergies you have, including allergies to shellfish or contrast dye.   All medicines you are taking, including vitamins, herbs, eye drops, creams, and over-the-counter medicines.   Previous problems you or members of your family have had with the use of anesthetics.   Any blood  disorders you have.   Previous surgeries you have had.  History of kidney problems or failure.   Other medical conditions you have. RISKS AND COMPLICATIONS  Generally, a coronary angiogram is a safe procedure. However, problems can occur and include:  Allergic reaction to the dye.  Bleeding from the access site or other locations.  Kidney injury, especially in people with impaired kidney function.  Stroke (rare).  Heart attack (rare). BEFORE THE PROCEDURE   Do not eat or drink anything after midnight the night before the procedure or as directed by your health care provider.   Ask your health care provider about changing or stopping your regular medicines. This is especially important if you are taking diabetes medicines or blood thinners. PROCEDURE  You may be given a medicine to help you relax (sedative) before the procedure. This medicine is given through an intravenous (IV) access tube that is inserted into one of your veins.   The area where the catheter will be inserted will be washed and shaved. This is usually done in the groin but may be done in the fold of your arm (near your elbow) or in the wrist.   A medicine will be given to numb the area where the catheter will be inserted (local anesthetic).   The health care provider will insert the catheter into an artery. The catheter will be guided by using a special type of X-ray (fluoroscopy) of the blood vessel being examined.   A special dye will then be injected into the catheter, and X-rays will be taken. The dye will help to show where any narrowing or blockages are located in the heart  arteries.  AFTER THE PROCEDURE   If the procedure is done through the leg, you will be kept in bed lying flat for several hours. You will be instructed to not bend or cross your legs.  The insertion site will be checked frequently.   The pulse in your feet or wrist will be checked frequently.   Additional blood tests,  X-rays, and an electrocardiogram may be done.    This information is not intended to replace advice given to you by your health care provider. Make sure you discuss any questions you have with your health care provider.   Document Released: 06/27/2002 Document Revised: 01/11/2014 Document Reviewed: 05/15/2012 Elsevier Interactive Patient Education [employer].     If you need a refill on your cardiac medications before your next appointment, please call your pharmacy.

## 2015-07-18 ENCOUNTER — Encounter (HOSPITAL_COMMUNITY)
Admission: RE | Disposition: A | Discharge status: Home / Self Care | Payer: Self-pay | Source: Ambulatory Visit | Attending: Cardiovascular Disease

## 2015-07-18 ENCOUNTER — Ambulatory Visit (HOSPITAL_COMMUNITY)
Admission: RE | Admit: 2015-07-18 | Discharge: 2015-07-18 | Disposition: A | Discharge status: Home / Self Care | Payer: Medicare Other | Source: Ambulatory Visit | Attending: Cardiovascular Disease | Admitting: Cardiovascular Disease

## 2015-07-18 ENCOUNTER — Encounter (HOSPITAL_COMMUNITY): Payer: Self-pay | Admitting: Cardiovascular Disease

## 2015-07-18 DIAGNOSIS — I471 Supraventricular tachycardia: Secondary | ICD-10-CM | POA: Diagnosis not present

## 2015-07-18 DIAGNOSIS — I5032 Chronic diastolic (congestive) heart failure: Secondary | ICD-10-CM | POA: Diagnosis not present

## 2015-07-18 DIAGNOSIS — N183 Chronic kidney disease, stage 3 (moderate): Secondary | ICD-10-CM | POA: Insufficient documentation

## 2015-07-18 DIAGNOSIS — Z96643 Presence of artificial hip joint, bilateral: Secondary | ICD-10-CM | POA: Insufficient documentation

## 2015-07-18 DIAGNOSIS — M8580 Other specified disorders of bone density and structure, unspecified site: Secondary | ICD-10-CM | POA: Insufficient documentation

## 2015-07-18 DIAGNOSIS — E039 Hypothyroidism, unspecified: Secondary | ICD-10-CM | POA: Insufficient documentation

## 2015-07-18 DIAGNOSIS — E7800 Pure hypercholesterolemia, unspecified: Secondary | ICD-10-CM | POA: Insufficient documentation

## 2015-07-18 DIAGNOSIS — I350 Nonrheumatic aortic (valve) stenosis: Secondary | ICD-10-CM | POA: Insufficient documentation

## 2015-07-18 DIAGNOSIS — R0609 Other forms of dyspnea: Secondary | ICD-10-CM

## 2015-07-18 DIAGNOSIS — I2510 Atherosclerotic heart disease of native coronary artery without angina pectoris: Secondary | ICD-10-CM | POA: Diagnosis not present

## 2015-07-18 DIAGNOSIS — Z853 Personal history of malignant neoplasm of breast: Secondary | ICD-10-CM | POA: Diagnosis not present

## 2015-07-18 DIAGNOSIS — R9439 Abnormal result of other cardiovascular function study: Secondary | ICD-10-CM | POA: Insufficient documentation

## 2015-07-18 DIAGNOSIS — E559 Vitamin D deficiency, unspecified: Secondary | ICD-10-CM | POA: Diagnosis not present

## 2015-07-18 DIAGNOSIS — Z8249 Family history of ischemic heart disease and other diseases of the circulatory system: Secondary | ICD-10-CM | POA: Diagnosis not present

## 2015-07-18 DIAGNOSIS — Z7982 Long term (current) use of aspirin: Secondary | ICD-10-CM | POA: Insufficient documentation

## 2015-07-18 DIAGNOSIS — Z8711 Personal history of peptic ulcer disease: Secondary | ICD-10-CM | POA: Diagnosis not present

## 2015-07-18 HISTORY — PX: CARDIAC CATHETERIZATION: SHX172

## 2015-07-18 LAB — POCT I-STAT 3, ART BLOOD GAS (G3+)
Acid-Base Excess: 2 mmol/L (ref 0.0–2.0)
Acid-Base Excess: 3 mmol/L — ABNORMAL HIGH (ref 0.0–2.0)
Bicarbonate: 28.6 mEq/L — ABNORMAL HIGH (ref 20.0–24.0)
Bicarbonate: 29.1 mEq/L — ABNORMAL HIGH (ref 20.0–24.0)
O2 Saturation: 71 %
O2 Saturation: 98 %
TCO2: 30 mmol/L (ref 0–100)
TCO2: 31 mmol/L (ref 0–100)
pCO2 arterial: 54.6 mmHg — ABNORMAL HIGH (ref 35.0–45.0)
pCO2 arterial: 51.5 mmHg — ABNORMAL HIGH (ref 35.0–45.0)
pH, Arterial: 7.327 — ABNORMAL LOW (ref 7.350–7.450)
pH, Arterial: 7.361 (ref 7.350–7.450)
pO2, Arterial: 41 mmHg — ABNORMAL LOW (ref 80.0–100.0)
pO2, Arterial: 111 mmHg — ABNORMAL HIGH (ref 80.0–100.0)

## 2015-07-18 SURGERY — RIGHT/LEFT HEART CATH AND CORONARY ANGIOGRAPHY

## 2015-07-18 MED ORDER — ASPIRIN 81 MG PO CHEW
CHEWABLE_TABLET | ORAL | Status: AC
  Administered 2015-07-18: 81 mg via ORAL
  Filled 2015-07-18: qty 1

## 2015-07-18 MED ORDER — FENTANYL CITRATE (PF) 100 MCG/2ML IJ SOLN
INTRAMUSCULAR | Status: AC
  Filled 2015-07-18: qty 2

## 2015-07-18 MED ORDER — SODIUM CHLORIDE 0.9% FLUSH: 3.0000 mL | Freq: Two times a day (BID) | INTRAVENOUS | Status: DC

## 2015-07-18 MED ORDER — ASPIRIN 81 MG PO CHEW
81.0000 mg | CHEWABLE_TABLET | ORAL | Status: AC
  Administered 2015-07-18: 81 mg via ORAL

## 2015-07-18 MED ORDER — IOPAMIDOL (ISOVUE-370) INJECTION 76%
INTRAVENOUS | Status: DC | PRN
  Administered 2015-07-18: 40 mL via INTRA_ARTERIAL

## 2015-07-18 MED ORDER — SODIUM CHLORIDE 0.9 % WEIGHT BASED INFUSION
1.0000 mL/kg/h | INTRAVENOUS | Status: DC
  Administered 2015-07-18: 1 mL/kg/h via INTRAVENOUS

## 2015-07-18 MED ORDER — HEPARIN (PORCINE) IN NACL 2-0.9 UNIT/ML-% IJ SOLN
INTRAMUSCULAR | Status: AC
Start: 2015-07-18 — End: 2015-07-18
  Filled 2015-07-18: qty 1000

## 2015-07-18 MED ORDER — VERAPAMIL HCL 2.5 MG/ML IV SOLN
INTRAVENOUS | Status: DC | PRN
  Administered 2015-07-18: 8 mL via INTRA_ARTERIAL

## 2015-07-18 MED ORDER — SODIUM CHLORIDE 0.9% FLUSH: 3.0000 mL | INTRAVENOUS | Status: DC | PRN

## 2015-07-18 MED ORDER — HEPARIN SODIUM (PORCINE) 1000 UNIT/ML IJ SOLN
INTRAMUSCULAR | Status: AC
  Filled 2015-07-18: qty 1

## 2015-07-18 MED ORDER — IOPAMIDOL (ISOVUE-370) INJECTION 76%
INTRAVENOUS | Status: AC
  Filled 2015-07-18: qty 100

## 2015-07-18 MED ORDER — VERAPAMIL HCL 2.5 MG/ML IV SOLN
INTRAVENOUS | Status: AC
  Filled 2015-07-18: qty 2

## 2015-07-18 MED ORDER — SODIUM CHLORIDE 0.9 % IV SOLN: 250.0000 mL | INTRAVENOUS | Status: DC | PRN

## 2015-07-18 MED ORDER — FENTANYL CITRATE (PF) 100 MCG/2ML IJ SOLN
INTRAMUSCULAR | Status: DC | PRN
  Administered 2015-07-18: 25 ug via INTRAVENOUS

## 2015-07-18 MED ORDER — LIDOCAINE HCL (PF) 1 % IJ SOLN
INTRAMUSCULAR | Status: DC | PRN
  Administered 2015-07-18 (×2): 2 mL

## 2015-07-18 MED ORDER — MIDAZOLAM HCL 2 MG/2ML IJ SOLN
INTRAMUSCULAR | Status: DC | PRN
  Administered 2015-07-18: 1 mg via INTRAVENOUS

## 2015-07-18 MED ORDER — LIDOCAINE HCL (PF) 1 % IJ SOLN
INTRAMUSCULAR | Status: AC
  Filled 2015-07-18: qty 30

## 2015-07-18 MED ORDER — SODIUM CHLORIDE 0.9 % IV SOLN: INTRAVENOUS | Status: DC

## 2015-07-18 MED ORDER — MIDAZOLAM HCL 2 MG/2ML IJ SOLN
INTRAMUSCULAR | Status: AC
  Filled 2015-07-18: qty 2

## 2015-07-18 MED ORDER — HEPARIN (PORCINE) IN NACL 2-0.9 UNIT/ML-% IJ SOLN
INTRAMUSCULAR | Status: DC | PRN
  Administered 2015-07-18: 1500 mL

## 2015-07-18 MED ORDER — SODIUM CHLORIDE 0.9 % WEIGHT BASED INFUSION
3.0000 mL/kg/h | INTRAVENOUS | Status: DC
  Administered 2015-07-18: 3 mL/kg/h via INTRAVENOUS

## 2015-07-18 MED ORDER — HEPARIN SODIUM (PORCINE) 1000 UNIT/ML IJ SOLN
INTRAMUSCULAR | Status: DC | PRN
Start: 2015-07-18 — End: 2015-07-18
  Administered 2015-07-18: 4500 [IU] via INTRAVENOUS

## 2015-07-18 SURGICAL SUPPLY — 17 items
CATH BALLN WEDGE 5F 110CM (CATHETERS) ×1 IMPLANT
CATH INFINITI 5FR AL1 (CATHETERS) ×1 IMPLANT
CATH INFINITI 5FR MULTPACK ANG (CATHETERS) ×1 IMPLANT
COVER PRB 48X5XTLSCP FOLD TPE (BAG) IMPLANT
COVER PROBE 5X48 (BAG) ×2
DEVICE RAD COMP TR BAND LRG (VASCULAR PRODUCTS) ×1 IMPLANT
GLIDESHEATH SLEND SS 6F .021 (SHEATH) ×1 IMPLANT
KIT HEART LEFT (KITS) ×2 IMPLANT
KIT HEART RIGHT NAMIC (KITS) ×2 IMPLANT
PACK CARDIAC CATHETERIZATION (CUSTOM PROCEDURE TRAY) ×2 IMPLANT
SHEATH FAST CATH BRACH 5F 5CM (SHEATH) ×1 IMPLANT
SYR MEDRAD MARK V 150ML (SYRINGE) ×2 IMPLANT
TRANSDUCER W/STOPCOCK (MISCELLANEOUS) ×3 IMPLANT
TUBING CIL FLEX 10 FLL-RA (TUBING) ×2 IMPLANT
WIRE EMERALD ST .035X150CM (WIRE) ×1 IMPLANT
WIRE HI TORQ VERSACORE-J 145CM (WIRE) ×1 IMPLANT
WIRE SAFE-T 1.5MM-J .035X260CM (WIRE) ×1 IMPLANT

## 2015-07-18 NOTE — Interval H&P Note (Signed)
History and Physical Interval Note:  07/18/2015 9:17 AM  [name]  has presented today for cardiac cath with the diagnosis of aortic stenosis, abnormal stress test  The various methods of treatment have been discussed with the patient and family. After consideration of risks, benefits and other options for treatment, the patient has consented to  Procedure(s): Right/Left Heart Cath and Coronary Angiography (N/A) as a surgical intervention .  The patient's history has been reviewed, patient examined, no change in status, stable for surgery.  I have reviewed the patient's chart and labs.  Questions were answered to the patient's satisfaction.    Cath Lab Visit (complete for each Cath Lab visit)  Clinical Evaluation Leading to the Procedure:   ACS: No.  Non-ACS:    Anginal Classification: CCS II  Anti-ischemic medical therapy: No Therapy  Non-Invasive Test Results: Intermediate-risk stress test findings: cardiac mortality 1-3%/year  Prior CABG: No previous CABG         Lauree Chandler

## 2015-07-18 NOTE — Research (Signed)
LEADERS FREE II Informed Consent   Subject Name: [name]  Subject met inclusion and exclusion criteria.  The informed consent form, study requirements and expectations were reviewed with the subject and questions and concerns were addressed prior to the signing of the consent form.  The subject verbalized understanding of the trial requirements.  The subject agreed to participate in the LEADERS FREE II trial and signed the informed consent.  The informed consent was obtained prior to performance of any protocol-specific procedures for the subject.  A copy of the signed informed consent was given to the subject and a copy was placed in the subject's medical record. [name] would be a candidate for the LEADERS FREE II trial if she meets angiographic criteria.  Berneda Rose 07/18/2015, 9:11 AM

## 2015-07-18 NOTE — Discharge Instructions (Signed)
Radial Site Care °Refer to this sheet in the next few weeks. These instructions provide you with information about caring for yourself after your procedure. Your health care provider may also give you more specific instructions. Your treatment has been planned according to current medical practices, but problems sometimes occur. Call your health care provider if you have any problems or questions after your procedure. °WHAT TO EXPECT AFTER THE PROCEDURE °After your procedure, it is typical to have the following: °· Bruising at the radial site that usually fades within 1-2 weeks. °· Blood collecting in the tissue (hematoma) that may be painful to the touch. It should usually decrease in size and tenderness within 1-2 weeks. °HOME CARE INSTRUCTIONS °· Take medicines only as directed by your health care provider. °· You may shower 24-48 hours after the procedure or as directed by your health care provider. Remove the bandage (dressing) and gently wash the site with plain soap and water. Pat the area dry with a clean towel. Do not rub the site, because this may cause bleeding. °· Do not take baths, swim, or use a hot tub until your health care provider approves. °· Check your insertion site every day for redness, swelling, or drainage. °· Do not apply powder or lotion to the site. °· Do not flex or bend the affected arm for 24 hours or as directed by your health care provider. °· Do not push or pull heavy objects with the affected arm for 24 hours or as directed by your health care provider. °· Do not lift over 10 lb (4.5 kg) for 5 days after your procedure or as directed by your health care provider. °· Ask your health care provider when it is okay to: °¨ Return to work or school. °¨ Resume usual physical activities or sports. °¨ Resume sexual activity. °· Do not drive home if you are discharged the same day as the procedure. Have someone else drive you. °· You may drive 24 hours after the procedure unless otherwise  instructed by your health care provider. °· Do not operate machinery or power tools for 24 hours after the procedure. °· If your procedure was done as an outpatient procedure, which means that you went home the same day as your procedure, a responsible adult should be with you for the first 24 hours after you arrive home. °· Keep all follow-up visits as directed by your health care provider. This is important. °SEEK MEDICAL CARE IF: °· You have a fever. °· You have chills. °· You have increased bleeding from the radial site. Hold pressure on the site. °SEEK IMMEDIATE MEDICAL CARE IF: °· You have unusual pain at the radial site. °· You have redness, warmth, or swelling at the radial site. °· You have drainage (other than a small amount of blood on the dressing) from the radial site. °· The radial site is bleeding, and the bleeding does not stop after 30 minutes of holding steady pressure on the site. °· Your arm or hand becomes pale, cool, tingly, or numb. °  °This information is not intended to replace advice given to you by your health care provider. Make sure you discuss any questions you have with your health care provider. °  °Document Released: 01/23/2010 Document Revised: 01/11/2014 Document Reviewed: 07/09/2013 °Elsevier Interactive Patient Education ©2016 [employer] Inc[employer] ° °

## 2015-07-18 NOTE — H&P (View-Only) (Signed)
Cardiology Office Note    Date:  07/17/2015   ID:  [name], DOB 1931-02-15, MRN [id number]  PCP:  Simona Huh, MD  Cardiologist:  Dr. Radford Pax  Chief complaint exertional weakness here to be scheduled for cath  History of Present Illness:  [name] is a 82 y.o. female with a history of rheumatic fever as a child, moderate AS with mild AI, SVT, diastolic dysfunction with chronic diastolic CHF. She saw Dr. Radford Pax 05/2015 and was complaining of his exertional weakness but no chest pain. Repeat 2-D echo showed moderate AS.nuclear stress test was consistent with ischemia in the basal inferolateral region. It was an intermediate risk study with the medium defect of mild severity. Right and left heart catheterization was recommended and she is here today to discuss. She was also having palpitations and 30 day monitor was placed.  Patient comes in today accompanied by her son-in-law. Patient complains of chronic dyspnea on exertion that slows her down but she never has to stop. She says she got very short of breath on the stress test. She denies any chest pain, dizziness or presyncope. She does have occasional palpitations that usually goes away with coughing. It occurs early in the morning. She hasn't had any for over a week.     Past Medical History  Diagnosis Date  . Hypercholesteremia   . Hypothyroidism   . Osteopenia   . PUD (peptic ulcer disease)   . Rheumatic fever   . Atypical chest pain   . CKD (chronic kidney disease), stage III   . SVT (supraventricular tachycardia) [facility]) October 2014    converted with vagal maneuver  . Vitamin D deficiency   . Aortic stenosis     moderate by echo 06/2013  . Diastolic dysfunction   . Cancer ([place]) 2006    SURGERY AND CHEMO AND RADIATION   . Breast cancer, right breast [facility])     Past Surgical History  Procedure Laterality Date  . Thyroid surgery      NODULE REMOVED  . Total hip arthroplasty Left 2003  . Replacement  total knee Right 2009  . Breast surgery      RIGHT LUMPECTOMY AND REMOVAL OF LYMPH NODES  . Total hip arthroplasty Right 08/28/2013    Procedure: RIGHT TOTAL HIP ARTHROPLASTY ANTERIOR APPROACH;  Surgeon: Mauri Pole, MD;  Location: [facility];  Service: Orthopedics;  Laterality: Right;  . Cholecystectomy N/A 08/06/2014    Procedure: LAPAROSCOPIC CHOLECYSTECTOMY ;  Surgeon: Rolm Bookbinder, MD;  Location: [place];  Service: General;  Laterality: N/A;    Current Medications: Outpatient Prescriptions Prior to Visit  Medication Sig Dispense Refill  . aspirin EC 81 MG tablet Take 81 mg by mouth daily.    . Calcium Carbonate-Vitamin D (CALCIUM + D PO) Take 1 tablet by mouth daily.    . Coenzyme Q10 (COQ-10 PO) Take 1 tablet by mouth daily.     . Cyanocobalamin (VITAMIN B-12 PO) Take 1 tablet by mouth daily.    Marland Kitchen desloratadine (CLARINEX REDITAB) 5 MG disintegrating tablet Take 5 mg by mouth daily as needed (for allergy symptoms).    . fish oil-omega-3 fatty acids 1000 MG capsule Take 1 g by mouth daily.    . furosemide (LASIX) 20 MG tablet Take 20 mg by mouth daily.    . lansoprazole (PREVACID) 30 MG capsule Take 30 mg by mouth daily.    Marland Kitchen levothyroxine (SYNTHROID, LEVOTHROID) 112 MCG tablet Take 112 mcg by mouth daily  before breakfast.    . magnesium gluconate (MAGONATE) 500 MG tablet Take 500 mg by mouth 2 (two) times daily.     . Multiple Vitamin (MULTIVITAMIN WITH MINERALS) TABS tablet Take 1 tablet by mouth daily.    . potassium chloride (K-DUR,KLOR-CON) 10 MEQ tablet Take 1 tablet (10 mEq total) by mouth daily. 30 tablet 6  . Probiotic Product (PROBIOTIC DAILY PO) Take 1 tablet by mouth daily.     . Vitamin D, Ergocalciferol, (DRISDOL) 50000 UNITS CAPS capsule Take 50,000 Units by mouth every 7 (seven) days. Saturday     No facility-administered medications prior to visit.     Allergies:   Atorvastatin; Compazine; Fenofibrate; Floxin; Livalo; Pravastatin sodium; and Simvastatin   Social  History   Social History  . Marital Status: Widowed    Spouse Name: N/A  . Number of Children: N/A  . Years of Education: N/A   Social History Main Topics  . Smoking status: Never Smoker   . Smokeless tobacco: Never Used  . Alcohol Use: No  . Drug Use: No  . Sexual Activity: Not Asked   Other Topics Concern  . None   Social History Narrative     Family History:  The patient's family history includes Heart disease in her father and mother.   ROS:   Please see the history of present illness.    Review of Systems  Constitution: Negative.  HENT: Negative.   Eyes: Negative.   Cardiovascular: Positive for dyspnea on exertion and palpitations.  Respiratory: Positive for cough and shortness of breath.   Hematologic/Lymphatic: Negative.   Musculoskeletal: Negative.  Negative for joint pain.  Gastrointestinal: Negative.   Genitourinary: Negative.   Neurological: Negative.    All other systems reviewed and are negative.   PHYSICAL EXAM:   VS:  BP 122/78 mmHg  Pulse 68  Ht 5\' 3"  (1.6 m)  Wt 191 lb (86.637 kg)  BMI 33.84 kg/m2   GEN: Well nourished, well developed, in no acute distress HEENT: normal Neck: Murmur portrayed in the carotids, no JVD, carotid bruits, or masses Cardiac:  RRR; 99991111 harsh systolic murmur at the left sternal border and right sternal border into her carotids, no rubs, or gallops,no edema  Respiratory:  clear to auscultation bilaterally, normal work of breathing GI: soft, nontender, nondistended, + BS MS: no deformity or atrophy Skin: warm and dry, no rash Neuro:  Alert and Oriented x 3, Strength and sensation are intact Psych: euthymic mood, full affect  Wt Readings from Last 3 Encounters:  07/17/15 191 lb (86.637 kg)  07/14/15 191 lb (86.637 kg)  05/08/15 191 lb 6.4 oz (86.818 kg)      Studies/Labs Reviewed:   EKG:  EKG is  ordered today.  The ekg ordered today demonstrates Normal sinus rhythm, normal EKG  Recent Labs: 08/05/2014: B  Natriuretic Peptide 54.1 08/07/2014: ALT 272*; BUN 10; Creatinine, Ser 0.91; Hemoglobin 12.4; Platelets 192; Potassium 4.3; Sodium 138   Lipid Panel No results found for: CHOL, TRIG, HDL, CHOLHDL, VLDL, LDLCALC, LDLDIRECT  Additional studies/ records that were reviewed today include:  Nuclear stress test 07/2015  Nuclear stress EF: 61%. No wall motion abnormalities  There was no ST segment deviation noted during stress.  Defect 1: There is a medium defect of mild severity present in the basal inferolateral location.  Findings consistent with ischemia.  This is an intermediate risk study.   2-D echo 05/2015 Study Conclusions   - Left ventricle: The cavity size was normal.  Wall thickness was   increased in a pattern of mild LVH. Systolic function was normal.   The estimated ejection fraction was in the range of 60% to 65%.   The study is not technically sufficient to allow evaluation of LV   diastolic function. - Aortic valve: AV is thickened, calcified with restricted motion   Peak and mean gradients through the valve are 50 and 31 mm Hg   respectively consistent with moderate AS> There was mild   regurgitation. - Mitral valve: MV is mildly thickened with minimally restricted   motion. - Left atrium: The atrium was moderately dilated.    ASSESSMENT:    1. Chronic diastolic heart failure ([place])   2. SVT (supraventricular tachycardia) ([place])   3. Abnormal stress test   4. Aortic stenosis      PLAN:  In order of problems listed above:  Chronic diastolic heart failure compensated  SVT: Palpitations seem to be controlled. There was discussion about placing an event monitor but she is not having daily palpitations and would prefer to wait until after the heart catheterization.  Abnormal stress test with basal inferior lateral ischemia and chronic dyspnea on exertion. Dr. Radford Pax recommends right and left heart catheterization to further assess ischemia and aortic stenosis. I  have reviewed the risks, indications, and alternatives to angioplasty and stenting with the patient. Risks include but are not limited to bleeding, infection, vascular injury, stroke, myocardial infection, arrhythmia, kidney injury, radiation-related injury in the case of prolonged fluoroscopy use, emergency cardiac surgery, and death. The patient understands the risks of serious complication is low (123456) and he agrees to proceed.   Aortic stenosis moderate by recent 2-D echo for right heart catheterization tomorrow  Dyspnea on exertion rule out secondary to ischemia with cardiac catheterization    Medication Adjustments/Labs and Tests Ordered: Current medicines are reviewed at length with the patient today.  Concerns regarding medicines are outlined above.  Medication changes, Labs and Tests ordered today are listed in the Patient Instructions below. Patient Instructions  Medication Instructions:  Your physician recommends that you continue on your current medications as directed. Please refer to the Current Medication list given to you today.   Labwork: TODAY:  BMET, CBC W/DIFF & PT/INR  Testing/Procedures: Your physician has requested that you have a cardiac catheterization. Cardiac catheterization is used to diagnose and/or treat various heart conditions. Doctors may recommend this procedure for a number of different reasons. The most common reason is to evaluate chest pain. Chest pain can be a symptom of coronary artery disease (CAD), and cardiac catheterization can show whether plaque is narrowing or blocking your heart's arteries. This procedure is also used to evaluate the valves, as well as measure the blood flow and oxygen levels in different parts of your heart. For further information please visit [email or web address]. Please follow instruction sheet, as given.    Follow-Up: Your physician recommends that you schedule a follow-up appointment in: WILL BE SET UP AT [place]   Any Other Special Instructions Will Be Listed Below (If Applicable). Coronary Angiogram A coronary angiogram, also called coronary angiography, is an X-ray procedure used to look at the arteries in the heart. In this procedure, a dye (contrast dye) is injected through a long, hollow tube (catheter). The catheter is about the size of a piece of cooked spaghetti and is inserted through your groin, wrist, or arm. The dye is injected into each artery, and X-rays are then taken to show if there  is a blockage in the arteries of your heart. LET [facility] CARE PROVIDER KNOW ABOUT:  Any allergies you have, including allergies to shellfish or contrast dye.   All medicines you are taking, including vitamins, herbs, eye drops, creams, and over-the-counter medicines.   Previous problems you or members of your family have had with the use of anesthetics.   Any blood disorders you have.   Previous surgeries you have had.  History of kidney problems or failure.   Other medical conditions you have. RISKS AND COMPLICATIONS  Generally, a coronary angiogram is a safe procedure. However, problems can occur and include:  Allergic reaction to the dye.  Bleeding from the access site or other locations.  Kidney injury, especially in people with impaired kidney function.  Stroke (rare).  Heart attack (rare). BEFORE THE PROCEDURE   Do not eat or drink anything after midnight the night before the procedure or as directed by your health care provider.   Ask your health care provider about changing or stopping your regular medicines. This is especially important if you are taking diabetes medicines or blood thinners. PROCEDURE  You may be given a medicine to help you relax (sedative) before the procedure. This medicine is given through an intravenous (IV) access tube that is inserted into one of your veins.   The area where the catheter will be inserted will be washed and shaved.  This is usually done in the groin but may be done in the fold of your arm (near your elbow) or in the wrist.   A medicine will be given to numb the area where the catheter will be inserted (local anesthetic).   The health care provider will insert the catheter into an artery. The catheter will be guided by using a special type of X-ray (fluoroscopy) of the blood vessel being examined.   A special dye will then be injected into the catheter, and X-rays will be taken. The dye will help to show where any narrowing or blockages are located in the heart arteries.  AFTER THE PROCEDURE   If the procedure is done through the leg, you will be kept in bed lying flat for several hours. You will be instructed to not bend or cross your legs.  The insertion site will be checked frequently.   The pulse in your feet or wrist will be checked frequently.   Additional blood tests, X-rays, and an electrocardiogram may be done.    This information is not intended to replace advice given to you by your health care provider. Make sure you discuss any questions you have with your health care provider.   Document Released: 06/27/2002 Document Revised: 01/11/2014 Document Reviewed: 05/15/2012 Elsevier Interactive Patient Education [employer].     If you need a refill on your cardiac medications before your next appointment, please call your pharmacy.       Sumner Boast, PA-C  07/17/2015 9:26 AM    [facility] [place], [place], [place]  [address] Phone: [phone]; Fax: [phone]

## 2015-08-05 ENCOUNTER — Other Ambulatory Visit: Payer: Self-pay | Admitting: Family Medicine

## 2015-08-05 DIAGNOSIS — Z1231 Encounter for screening mammogram for malignant neoplasm of breast: Secondary | ICD-10-CM

## 2015-08-05 DIAGNOSIS — M810 Age-related osteoporosis without current pathological fracture: Secondary | ICD-10-CM | POA: Diagnosis not present

## 2015-08-06 ENCOUNTER — Encounter (INDEPENDENT_AMBULATORY_CARE_PROVIDER_SITE_OTHER): Payer: Self-pay

## 2015-08-06 ENCOUNTER — Encounter: Payer: Self-pay | Admitting: Physician Assistant

## 2015-08-06 ENCOUNTER — Ambulatory Visit (INDEPENDENT_AMBULATORY_CARE_PROVIDER_SITE_OTHER): Payer: Medicare Other

## 2015-08-06 ENCOUNTER — Ambulatory Visit (INDEPENDENT_AMBULATORY_CARE_PROVIDER_SITE_OTHER): Payer: Medicare Other | Admitting: Physician Assistant

## 2015-08-06 VITALS — BP 124/74 | HR 70 | Ht 63.0 in | Wt 190.8 lb

## 2015-08-06 DIAGNOSIS — R0609 Other forms of dyspnea: Secondary | ICD-10-CM | POA: Diagnosis not present

## 2015-08-06 DIAGNOSIS — R9439 Abnormal result of other cardiovascular function study: Secondary | ICD-10-CM

## 2015-08-06 DIAGNOSIS — I350 Nonrheumatic aortic (valve) stenosis: Secondary | ICD-10-CM

## 2015-08-06 DIAGNOSIS — R002 Palpitations: Secondary | ICD-10-CM

## 2015-08-06 DIAGNOSIS — I5032 Chronic diastolic (congestive) heart failure: Secondary | ICD-10-CM

## 2015-08-06 NOTE — Progress Notes (Signed)
Cardiology Office Note    Date:  08/06/2015   ID:  [name], DOB 12/08/1931, MRN [id number]  PCP:  Simona Huh, MD  Cardiologist: Dr. Radford Pax  Chief Complaint  Patient presents with  . Follow-up    History of Present Illness:  [name] is a 82 y.o. female  with a history of rheumatic fever as a child, moderate AS with mild AI, SVT, diastolic dysfunction with chronic diastolic CHF. She saw Dr. Radford Pax 05/2015 and was complaining of his exertional weakness but no chest pain. Repeat 2-D echo showed moderate AS.nuclear stress test was consistent with ischemia in the basal inferolateral region. It was an intermediate risk study with the medium defect of mild severity.   Right and left heart catheterization was performed and showed mild LAD lesion 20% stenosed, mild nonobstructive CAD, moderate aortic stenosis with peak to peak gradient 20 mmHg aortic valve area 1.1 cm.   She was also having palpitations and 30 day monitor was recommended but she wanted to hold off until after cath. She hasn't had any palpitations in the past 2 weeks. When she has at its early morning and she feels completely washed out a week for the rest the day. She still has chronic dyspnea on exertion. She denies any chest pain.     Past Medical History:  Diagnosis Date  . Aortic stenosis    moderate by echo 06/2013  . Atypical chest pain   . Breast cancer, right breast ([place])   . Cancer ([place]) 2006   SURGERY AND CHEMO AND RADIATION   . CKD (chronic kidney disease), stage III   . Diastolic dysfunction   . Hypercholesteremia   . Hypothyroidism   . Osteopenia   . PUD (peptic ulcer disease)   . Rheumatic fever   . SVT (supraventricular tachycardia) [facility]) October 2014   converted with vagal maneuver  . Vitamin D deficiency     Past Surgical History:  Procedure Laterality Date  . BREAST SURGERY     RIGHT LUMPECTOMY AND REMOVAL OF LYMPH NODES  . CARDIAC CATHETERIZATION N/A 07/18/2015   Procedure: Right/Left Heart Cath and Coronary Angiography;  Surgeon: [name], MD;  Location: [employer] [facility] [facility];  Service: Cardiovascular;  Laterality: N/A;  . CHOLECYSTECTOMY N/A 08/06/2014   Procedure: LAPAROSCOPIC CHOLECYSTECTOMY ;  Surgeon: Rolm Bookbinder, MD;  Location: [place];  Service: General;  Laterality: N/A;  . REPLACEMENT TOTAL KNEE Right 2009  . THYROID SURGERY     NODULE REMOVED  . TOTAL HIP ARTHROPLASTY Left 2003  . TOTAL HIP ARTHROPLASTY Right 08/28/2013   Procedure: RIGHT TOTAL HIP ARTHROPLASTY ANTERIOR APPROACH;  Surgeon: Mauri Pole, MD;  Location: [facility];  Service: Orthopedics;  Laterality: Right;    Current Medications: Outpatient Medications Prior to Visit  Medication Sig Dispense Refill  . aspirin EC 81 MG tablet Take 81 mg by mouth daily.    . Calcium Carbonate-Vitamin D (CALCIUM + D PO) Take 1 tablet by mouth daily.    . Coenzyme Q10 (COQ-10 PO) Take 1 tablet by mouth daily.     . Cyanocobalamin (VITAMIN B-12 PO) Take 1 tablet by mouth daily.    Marland Kitchen desloratadine (CLARINEX REDITAB) 5 MG disintegrating tablet Take 5 mg by mouth daily as needed (for allergy symptoms).    . fish oil-omega-3 fatty acids 1000 MG capsule Take 1 g by mouth daily.    . furosemide (LASIX) 20 MG tablet Take 20 mg by mouth daily.    Marland Kitchen  lansoprazole (PREVACID) 30 MG capsule Take 30 mg by mouth daily.    Marland Kitchen levothyroxine (SYNTHROID, LEVOTHROID) 112 MCG tablet Take 112 mcg by mouth daily before breakfast.    . magnesium gluconate (MAGONATE) 500 MG tablet Take 500 mg by mouth 2 (two) times daily.     . Multiple Vitamin (MULTIVITAMIN WITH MINERALS) TABS tablet Take 1 tablet by mouth daily.    . potassium chloride (K-DUR,KLOR-CON) 10 MEQ tablet Take 1 tablet (10 mEq total) by mouth daily. 30 tablet 6  . Probiotic Product (PROBIOTIC DAILY PO) Take 1 tablet by mouth daily.     . Vitamin D, Ergocalciferol, (DRISDOL) 50000 UNITS CAPS capsule Take 50,000 Units by mouth every 7 (seven) days.  Saturday     No facility-administered medications prior to visit.      Allergies:   Atorvastatin; Compazine [prochlorperazine]; Fenofibrate; Floxin [ofloxacin]; Livalo [pitavastatin]; Pravastatin sodium; and Simvastatin   Social History   Social History  . Marital status: Widowed    Spouse name: N/A  . Number of children: N/A  . Years of education: N/A   Social History Main Topics  . Smoking status: Never Smoker  . Smokeless tobacco: Never Used  . Alcohol use No  . Drug use: No  . Sexual activity: Not Asked   Other Topics Concern  . None   Social History Narrative  . None     Family History:  The patient's family history includes Heart disease in her father and mother.   ROS:   Please see the history of present illness.    Review of Systems  Constitution: Negative.  HENT: Negative.   Eyes: Negative.   Cardiovascular: Positive for dyspnea on exertion, irregular heartbeat and palpitations.  Respiratory: Negative.   Hematologic/Lymphatic: Negative.   Musculoskeletal: Negative.  Negative for joint pain.  Gastrointestinal: Negative.   Genitourinary: Negative.   Neurological: Negative.    All other systems reviewed and are negative.   PHYSICAL EXAM:   VS:  BP 124/74 (BP Location: Left Arm)   Pulse 70   Ht 5\' 3"  (1.6 m)   Wt 190 lb 12.8 oz (86.5 kg)   BMI 33.80 kg/m   Physical Exam  GEN: Well nourished, well developed, in no acute distress Neck: Murmur portrayed into her carotids no JVD,or masses Cardiac:RRR; 2/6 systolic murmur at the left sternal border and a carotids, no rubs, or gallops  Respiratory:  clear to auscultation bilaterally, normal work of breathing GI: soft, nontender, nondistended, + BS Ext: Left wrist at cath site without hematoma or hemorrhage good radial and brachial pulses, lower extremities without cyanosis, clubbing, or edema, Good distal pulses bilaterally MS: no deformity or atrophy Skin: warm and dry, no rash Psych: euthymic mood, full  affect  Wt Readings from Last 3 Encounters:  08/06/15 190 lb 12.8 oz (86.5 kg)  07/18/15 191 lb (86.6 kg)  07/17/15 191 lb (86.6 kg)      Studies/Labs Reviewed:   EKG:  EKG is Not ordered today.    Recent Labs: 08/07/2014: ALT 272 07/17/2015: BUN 21; Creat 1.00; Hemoglobin 14.5; Platelets 256; Potassium 4.5; Sodium 139   Lipid Panel No results found for: CHOL, TRIG, HDL, CHOLHDL, VLDL, LDLCALC, LDLDIRECT  Additional studies/ records that were reviewed today include:  Presentation 07/18/15 Conclusion   Mid LAD lesion, 20% stenosed.   1. Mild non-obstructive CAD 2. Moderate aortic stenosis (peak to peak gradient 20 mmHg, AVA 1.1 cm2)   Recommendations: Medical management of CAD. Follow aortic stenosis with serial  echocardiograms.      Nuclear stress test 07/2015  Nuclear stress EF: 61%. No wall motion abnormalities  There was no ST segment deviation noted during stress.  Defect 1: There is a medium defect of mild severity present in the basal inferolateral location.  Findings consistent with ischemia.  This is an intermediate risk study.   2-D echo 05/2015 Study Conclusions   - Left ventricle: The cavity size was normal. Wall thickness was   increased in a pattern of mild LVH. Systolic function was normal.   The estimated ejection fraction was in the range of 60% to 65%.   The study is not technically sufficient to allow evaluation of LV   diastolic function. - Aortic valve: AV is thickened, calcified with restricted motion   Peak and mean gradients through the valve are 50 and 31 mm Hg   respectively consistent with moderate AS> There was mild   regurgitation. - Mitral valve: MV is mildly thickened with minimally restricted   motion. - Left atrium: The atrium was moderately dilated.       ASSESSMENT:    1. Palpitations   2. Aortic stenosis   3. Dyspnea on exertion   4. Abnormal stress test   5. Chronic diastolic heart failure (HCC)      PLAN:  In  order of problems listed above:  Palpitations patient has history of SVT and is not on any medications. We'll place an event recorder to rule out SVT her bradycardia.  Moderate aortic stenosis and 2-D echo and cardiac catheterization. We'll follow with periodic 2-D echoes. Follow-up with Dr. Radford Pax in 2 months.  Dyspnea on exertion could be related to aortic stenosis or arrhythmia.  Abnormal stress test with mild nonobstructive CAD on cardiac catheterization  Chronic diastolic heart failure compensated    Medication Adjustments/Labs and Tests Ordered: Current medicines are reviewed at length with the patient today.  Concerns regarding medicines are outlined above.  Medication changes, Labs and Tests ordered today are listed in the Patient Instructions below. Patient Instructions  Medication Instructions:  Your physician recommends that you continue on your current medications as directed. Please refer to the Current Medication list given to you today.  Labwork: NONE  Testing/Procedures: Your physician has recommended that you wear an event monitor. Event monitors are medical devices that record the heart's electrical activity. Doctors most often [place] these monitors to diagnose arrhythmias. Arrhythmias are problems with the speed or rhythm of the heartbeat. The monitor is a small, portable device. You can wear one while you do your normal daily activities. This is usually used to diagnose what is causing palpitations/syncope (passing out).   Follow-Up: Your physician recommends that you schedule a follow-up appointment in: 2 MONTHS  WITH DR  TURNER Any Other Special Instructions Will Be Listed Below (If Applicable).     If you need a refill on your cardiac medications before your next appointment, please call your pharmacy.      Sumner Boast, PA-C  08/06/2015 8:34 AM    [facility] [place], [place], [place]  [address] Phone: [phone];  Fax: [phone]

## 2015-08-06 NOTE — Patient Instructions (Signed)
Medication Instructions:  Your physician recommends that you continue on your current medications as directed. Please refer to the Current Medication list given to you today.  Labwork: NONE  Testing/Procedures: Your physician has recommended that you wear an event monitor. Event monitors are medical devices that record the heart's electrical activity. Doctors most often [place] these monitors to diagnose arrhythmias. Arrhythmias are problems with the speed or rhythm of the heartbeat. The monitor is a small, portable device. You can wear one while you do your normal daily activities. This is usually used to diagnose what is causing palpitations/syncope (passing out).   Follow-Up: Your physician recommends that you schedule a follow-up appointment in: 2 MONTHS  WITH DR  TURNER Any Other Special Instructions Will Be Listed Below (If Applicable).     If you need a refill on your cardiac medications before your next appointment, please call your pharmacy.

## 2015-08-14 ENCOUNTER — Telehealth: Payer: Self-pay | Admitting: Cardiology

## 2015-08-14 NOTE — Telephone Encounter (Signed)
New message       What dental office are you calling from? Dr Arita Miss What is your office phone and fax number? [phone] What type of procedure is the patient having performed?  Tooth extraction 1. What date is procedure scheduled? Pending clearance  2. What is your question (ex. Antibiotics prior to procedure, holding medication-we need to know how long dentist wants pt to hold med)? Pt is wearing an event monitor and need to be cleared

## 2015-08-14 NOTE — Telephone Encounter (Signed)
error 

## 2015-08-14 NOTE — Telephone Encounter (Signed)
Faxed to Dr. Ronnald Ramp at [phone].

## 2015-08-14 NOTE — Telephone Encounter (Signed)
She does not need antibx prophylaxis.  She is on ASA but I did not put her on that

## 2015-08-19 ENCOUNTER — Ambulatory Visit: Payer: Medicare Other

## 2015-09-11 ENCOUNTER — Ambulatory Visit
Admission: RE | Admit: 2015-09-11 | Discharge: 2015-09-11 | Disposition: A | Discharge status: Home / Self Care | Payer: Medicare Other | Source: Ambulatory Visit | Attending: Family Medicine | Admitting: Family Medicine

## 2015-09-11 DIAGNOSIS — Z1231 Encounter for screening mammogram for malignant neoplasm of breast: Secondary | ICD-10-CM

## 2015-09-24 ENCOUNTER — Encounter: Payer: Self-pay | Admitting: Cardiology

## 2015-10-06 ENCOUNTER — Other Ambulatory Visit: Payer: Self-pay | Admitting: Cardiology

## 2015-10-09 ENCOUNTER — Ambulatory Visit: Payer: Medicare Other | Admitting: Cardiology

## 2015-10-20 ENCOUNTER — Encounter: Payer: Self-pay | Admitting: Cardiology

## 2015-10-20 ENCOUNTER — Encounter (INDEPENDENT_AMBULATORY_CARE_PROVIDER_SITE_OTHER): Payer: Self-pay

## 2015-10-20 ENCOUNTER — Ambulatory Visit (INDEPENDENT_AMBULATORY_CARE_PROVIDER_SITE_OTHER): Payer: Medicare Other | Admitting: Cardiology

## 2015-10-20 VITALS — BP 118/80 | HR 72 | Ht 63.0 in | Wt 189.8 lb

## 2015-10-20 DIAGNOSIS — I060 Rheumatic aortic stenosis: Secondary | ICD-10-CM

## 2015-10-20 DIAGNOSIS — I471 Supraventricular tachycardia: Secondary | ICD-10-CM | POA: Diagnosis not present

## 2015-10-20 DIAGNOSIS — I2510 Atherosclerotic heart disease of native coronary artery without angina pectoris: Secondary | ICD-10-CM | POA: Insufficient documentation

## 2015-10-20 DIAGNOSIS — I5032 Chronic diastolic (congestive) heart failure: Secondary | ICD-10-CM | POA: Diagnosis not present

## 2015-10-20 HISTORY — DX: Atherosclerotic heart disease of native coronary artery without angina pectoris: I25.10

## 2015-10-20 NOTE — Patient Instructions (Signed)
Medication Instructions:  Your physician recommends that you continue on your current medications as directed. Please refer to the Current Medication list given to you today.   Labwork: None  Testing/Procedures: Your physician has requested that you have an echocardiogram in July, 2018. Echocardiography is a painless test that uses sound waves to create images of your heart. It provides your doctor with information about the size and shape of your heart and how well your heart's chambers and valves are working. This procedure takes approximately one hour. There are no restrictions for this procedure.   Follow-Up: Your physician wants you to follow-up in: 6 months with Dr. Radford Pax. You will receive a reminder letter in the mail two months in advance. If you don't receive a letter, please call our office to schedule the follow-up appointment.   Any Other Special Instructions Will Be Listed Below (If Applicable).     If you need a refill on your cardiac medications before your next appointment, please call your pharmacy.

## 2015-10-20 NOTE — Progress Notes (Signed)
Cardiology Office Note    Date:  10/20/2015   ID:  [name], DOB 04-Aug-1931, MRN [id number]  PCP:  Simona Huh, MD  Cardiologist:  Fransico Him, MD   Chief Complaint  Patient presents with  . Aortic Stenosis  . Coronary Artery Disease  . Congestive Heart Failure    History of Present Illness:  [name] is a 82 y.o. female with a history of rheumatic fever as a child, moderate AS with mild AI, SVT, diastolic dysfunction with chronic diastolic CHF who presents today for followup. At last OV she complained of weakness and fatigue and a nuclear stress test showed basal inferolateral ischemia and she underwent right and left heart cath which showed 20% mild LAD and otherwise normal coronary arteries and moderate AS.  She presents back today for folloup.   She is doing well today.   She denies any chest pain. She has chronic DOE which is fairly stable.She denies any LE edema, dizziness or syncope.She has occasional skipped beats and recent heart monitor showed PVCs and PACs.      Past Medical History:  Diagnosis Date  . Aortic stenosis    moderate by cath 07/2015  . Atypical chest pain   . Breast cancer, right breast ([place])   . CAD (coronary artery disease), native coronary artery 10/20/2015   20% mid LAD  . Cancer ([place]) 2006   SURGERY AND CHEMO AND RADIATION   . CKD (chronic kidney disease), stage III   . Diastolic dysfunction   . Hypercholesteremia   . Hypothyroidism   . Osteopenia   . PUD (peptic ulcer disease)   . Rheumatic fever   . SVT (supraventricular tachycardia) [facility]) October 2014   converted with vagal maneuver  . Vitamin D deficiency     Past Surgical History:  Procedure Laterality Date  . BREAST SURGERY     RIGHT LUMPECTOMY AND REMOVAL OF LYMPH NODES  . CARDIAC CATHETERIZATION N/A 07/18/2015   Procedure: Right/Left Heart Cath and Coronary Angiography;  Surgeon: [name], MD;  Location: [place] [facility];  Service:  Cardiovascular;  Laterality: N/A;  . CHOLECYSTECTOMY N/A 08/06/2014   Procedure: LAPAROSCOPIC CHOLECYSTECTOMY ;  Surgeon: Rolm Bookbinder, MD;  Location: [facility];  Service: General;  Laterality: N/A;  . REPLACEMENT TOTAL KNEE Right 2009  . THYROID SURGERY     NODULE REMOVED  . TOTAL HIP ARTHROPLASTY Left 2003  . TOTAL HIP ARTHROPLASTY Right 08/28/2013   Procedure: RIGHT TOTAL HIP ARTHROPLASTY ANTERIOR APPROACH;  Surgeon: Mauri Pole, MD;  Location: [facility];  Service: Orthopedics;  Laterality: Right;    Current Medications: Outpatient Medications Prior to Visit  Medication Sig Dispense Refill  . aspirin EC 81 MG tablet Take 81 mg by mouth daily.    . Calcium Carbonate-Vitamin D (CALCIUM + D PO) Take 1 tablet by mouth daily.    . Coenzyme Q10 (COQ-10 PO) Take 1 tablet by mouth daily.     . Cyanocobalamin (VITAMIN B-12 PO) Take 1 tablet by mouth daily.    Marland Kitchen desloratadine (CLARINEX REDITAB) 5 MG disintegrating tablet Take 5 mg by mouth daily as needed (for allergy symptoms).    . fish oil-omega-3 fatty acids 1000 MG capsule Take 1 g by mouth daily.    . furosemide (LASIX) 20 MG tablet TAKE 1 TABLET BY MOUTH EVERY EVENING 30 tablet 9  . lansoprazole (PREVACID) 30 MG capsule Take 30 mg by mouth daily.    Marland Kitchen levothyroxine (SYNTHROID, LEVOTHROID) 112  MCG tablet Take 112 mcg by mouth daily before breakfast.    . magnesium gluconate (MAGONATE) 500 MG tablet Take 500 mg by mouth 2 (two) times daily.     . Multiple Vitamin (MULTIVITAMIN WITH MINERALS) TABS tablet Take 1 tablet by mouth daily.    . potassium chloride (K-DUR,KLOR-CON) 10 MEQ tablet TAKE 1 TABLET BY MOUTH EVERY DAY 30 tablet 9  . Probiotic Product (PROBIOTIC DAILY PO) Take 1 tablet by mouth daily.     . Vitamin D, Ergocalciferol, (DRISDOL) 50000 UNITS CAPS capsule Take 50,000 Units by mouth every 7 (seven) days. Saturday     No facility-administered medications prior to visit.      Allergies:   Atorvastatin; Compazine  [prochlorperazine]; Fenofibrate; Floxin [ofloxacin]; Livalo [pitavastatin]; Pravastatin sodium; and Simvastatin   Social History   Social History  . Marital status: Widowed    Spouse name: N/A  . Number of children: N/A  . Years of education: N/A   Social History Main Topics  . Smoking status: Never Smoker  . Smokeless tobacco: Never Used  . Alcohol use No  . Drug use: No  . Sexual activity: Not Asked   Other Topics Concern  . None   Social History Narrative  . None     Family History:  The patient's family history includes Heart disease in her father and mother.   ROS:   Please see the history of present illness.    ROS All other systems reviewed and are negative.  No flowsheet data found.     PHYSICAL EXAM:   VS:  BP 118/80   Pulse 72   Ht 5\' 3"  (1.6 m)   Wt 189 lb 12.8 oz (86.1 kg)   LMP  (LMP Unknown)   BMI 33.62 kg/m    GEN: Well nourished, well developed, in no acute distress  HEENT: normal  Neck: no JVD, carotid bruits, or masses Cardiac: RRR; no murmurs, rubs, or gallops,no edema.  Intact distal pulses bilaterally.  Respiratory:  clear to auscultation bilaterally, normal work of breathing GI: soft, nontender, nondistended, + BS MS: no deformity or atrophy  Skin: warm and dry, no rash Neuro:  Alert and Oriented x 3, Strength and sensation are intact Psych: euthymic mood, full affect  Wt Readings from Last 3 Encounters:  10/20/15 189 lb 12.8 oz (86.1 kg)  08/06/15 190 lb 12.8 oz (86.5 kg)  07/18/15 191 lb (86.6 kg)      Studies/Labs Reviewed:   EKG:  EKG is not ordered today.    Recent Labs: 07/17/2015: BUN 21; Creat 1.00; Hemoglobin 14.5; Platelets 256; Potassium 4.5; Sodium 139   Lipid Panel No results found for: CHOL, TRIG, HDL, CHOLHDL, VLDL, LDLCALC, LDLDIRECT  Additional studies/ records that were reviewed today include:  none    ASSESSMENT:    1. Rheumatic aortic stenosis   2. Coronary artery disease involving native coronary  artery of native heart without angina pectoris   3. Chronic diastolic heart failure ([place])   4. SVT (supraventricular tachycardia) (HCC)     PLAN:  In order of problems listed above:  1. Rheumatic AS - moderate by recent cath.  Repeat echo 07/2016. 2. ASCAD - non obstructive with 20% mid LAD.  Continue ASA.  Needs aggressive secondary prevention.  I will get a copy of FLP from PCP.. 3. Chronic diastolic CHF - appears euvolemic on exam.  Continue Lasix.  4. SVT with no reoccurence.     Medication Adjustments/Labs and Tests Ordered: Current medicines  are reviewed at length with the patient today.  Concerns regarding medicines are outlined above.  Medication changes, Labs and Tests ordered today are listed in the Patient Instructions below.  There are no Patient Instructions on file for this visit.   Signed, Fransico Him, MD  10/20/2015 1:49 PM    [facility] [place], [place], [place]  [address] Phone: [phone]; Fax: [phone]

## 2015-10-29 DIAGNOSIS — Z23 Encounter for immunization: Secondary | ICD-10-CM | POA: Diagnosis not present

## 2015-11-18 DIAGNOSIS — N390 Urinary tract infection, site not specified: Secondary | ICD-10-CM | POA: Diagnosis not present

## 2015-11-18 DIAGNOSIS — R300 Dysuria: Secondary | ICD-10-CM | POA: Diagnosis not present

## 2015-12-15 DIAGNOSIS — J069 Acute upper respiratory infection, unspecified: Secondary | ICD-10-CM | POA: Diagnosis not present

## 2016-04-22 ENCOUNTER — Encounter: Payer: Self-pay | Admitting: Cardiology

## 2016-04-22 ENCOUNTER — Ambulatory Visit (INDEPENDENT_AMBULATORY_CARE_PROVIDER_SITE_OTHER): Payer: Medicare Other | Admitting: Cardiology

## 2016-04-22 ENCOUNTER — Encounter (INDEPENDENT_AMBULATORY_CARE_PROVIDER_SITE_OTHER): Payer: Self-pay

## 2016-04-22 VITALS — BP 126/82 | HR 64 | Ht 63.0 in | Wt 188.4 lb

## 2016-04-22 DIAGNOSIS — E7800 Pure hypercholesterolemia, unspecified: Secondary | ICD-10-CM | POA: Diagnosis not present

## 2016-04-22 DIAGNOSIS — I350 Nonrheumatic aortic (valve) stenosis: Secondary | ICD-10-CM | POA: Diagnosis not present

## 2016-04-22 DIAGNOSIS — I471 Supraventricular tachycardia, unspecified: Secondary | ICD-10-CM

## 2016-04-22 DIAGNOSIS — I2510 Atherosclerotic heart disease of native coronary artery without angina pectoris: Secondary | ICD-10-CM | POA: Diagnosis not present

## 2016-04-22 DIAGNOSIS — R0602 Shortness of breath: Secondary | ICD-10-CM | POA: Diagnosis not present

## 2016-04-22 DIAGNOSIS — I5032 Chronic diastolic (congestive) heart failure: Secondary | ICD-10-CM | POA: Diagnosis not present

## 2016-04-22 LAB — BASIC METABOLIC PANEL
BUN/Creatinine Ratio: 18 (ref 12–28)
BUN: 16 mg/dL (ref 8–27)
CHLORIDE: 95 mmol/L — AB (ref 96–106)
CO2: 26 mmol/L (ref 18–29)
CREATININE: 0.89 mg/dL (ref 0.57–1.00)
Calcium: 10 mg/dL (ref 8.7–10.3)
GFR calc Af Amer: 70 mL/min/{1.73_m2} (ref >59)
GFR calc non Af Amer: 61 mL/min/{1.73_m2} (ref >59)
GLUCOSE: 83 mg/dL (ref 65–99)
Potassium: 4.5 mmol/L (ref 3.5–5.2)
SODIUM: 138 mmol/L (ref 134–144)

## 2016-04-22 LAB — PRO B NATRIURETIC PEPTIDE: NT-PRO BNP: 307 pg/mL (ref 0–738)

## 2016-04-22 MED ORDER — FUROSEMIDE 20 MG PO TABS: 20.0000 mg | ORAL_TABLET | Freq: Every evening | ORAL | 3 refills | Status: DC

## 2016-04-22 MED ORDER — POTASSIUM CHLORIDE CRYS ER 10 MEQ PO TBCR: 10.0000 meq | EXTENDED_RELEASE_TABLET | Freq: Every day | ORAL | 3 refills | Status: DC

## 2016-04-22 NOTE — Progress Notes (Signed)
Cardiology Office Note    Date:  04/22/2016   ID:  [name], DOB [birth year], MRN [id number]  PCP:  Simona Huh, MD  Cardiologist:  Fransico Him, MD   Chief Complaint  Patient presents with  . Coronary Artery Disease  . Hypertension  . Aortic Stenosis  . Congestive Heart Failure    History of Present Illness:  [name] is a 83 y.o. female with a history of rheumatic fever as a child, moderate AS with mild AI, SVT, diastolic dysfunction with chronic diastolic CHF and ASCAD with cath showing 20% mild LAD and otherwise normal coronary arteries and moderate AS.  She is here today for followup and is doing well.  She denies any chest pain or pressure. She has chronic DOE which is fairly stable.She denies any LE edema, dizziness or syncope.She has occasional skipped beats and has a history of PVCs and PACs by heart monitor in the past.     Past Medical History:  Diagnosis Date  . Aortic stenosis    moderate by cath 07/2015  . Atypical chest pain   . Breast cancer, right breast ([place])   . CAD (coronary artery disease), native coronary artery 10/20/2015   20% mid LAD  . Cancer ([place]) 2006   SURGERY AND CHEMO AND RADIATION   . CKD (chronic kidney disease), stage III   . Diastolic dysfunction   . Hypercholesteremia   . Hypothyroidism   . Osteopenia   . PUD (peptic ulcer disease)   . Rheumatic fever   . SVT (supraventricular tachycardia) [facility]) October 2014   converted with vagal maneuver  . Vitamin D deficiency     Past Surgical History:  Procedure Laterality Date  . BREAST SURGERY     RIGHT LUMPECTOMY AND REMOVAL OF LYMPH NODES  . CARDIAC CATHETERIZATION N/A 07/18/2015   Procedure: Right/Left Heart Cath and Coronary Angiography;  Surgeon: [name], MD;  Location: [facility];  Service: Cardiovascular;  Laterality: N/A;  . CHOLECYSTECTOMY N/A 08/06/2014   Procedure: LAPAROSCOPIC CHOLECYSTECTOMY ;  Surgeon: Rolm Bookbinder, MD;   Location: [facility];  Service: General;  Laterality: N/A;  . REPLACEMENT TOTAL KNEE Right 2009  . THYROID SURGERY     NODULE REMOVED  . TOTAL HIP ARTHROPLASTY Left 2003  . TOTAL HIP ARTHROPLASTY Right 08/28/2013   Procedure: RIGHT TOTAL HIP ARTHROPLASTY ANTERIOR APPROACH;  Surgeon: Mauri Pole, MD;  Location: [facility];  Service: Orthopedics;  Laterality: Right;    Current Medications: Current Meds  Medication Sig  . aspirin EC 81 MG tablet Take 81 mg by mouth daily.  . Calcium Carbonate-Vitamin D (CALCIUM + D PO) Take 1 tablet by mouth daily.  . Coenzyme Q10 (COQ-10 PO) Take 1 tablet by mouth daily.   . Cyanocobalamin (VITAMIN B-12 PO) Take 1 tablet by mouth daily.  . fish oil-omega-3 fatty acids 1000 MG capsule Take 1 g by mouth daily.  . fluticasone (FLONASE) 50 MCG/ACT nasal spray Place 1 spray into both nostrils daily.  . furosemide (LASIX) 20 MG tablet TAKE 1 TABLET BY MOUTH EVERY EVENING  . lansoprazole (PREVACID) 30 MG capsule Take 30 mg by mouth daily.  Marland Kitchen levothyroxine (SYNTHROID, LEVOTHROID) 112 MCG tablet Take 112 mcg by mouth daily before breakfast.  . magnesium gluconate (MAGONATE) 500 MG tablet Take 500 mg by mouth 2 (two) times daily.   . Multiple Vitamin (MULTIVITAMIN WITH MINERALS) TABS tablet Take 1 tablet by mouth daily.  . potassium chloride (K-DUR,KLOR-CON) 10  MEQ tablet TAKE 1 TABLET BY MOUTH EVERY DAY  . Vitamin D, Ergocalciferol, (DRISDOL) 50000 UNITS CAPS capsule Take 50,000 Units by mouth every 7 (seven) days. Saturday    Allergies:   Atorvastatin; Compazine [prochlorperazine]; Fenofibrate; Floxin [ofloxacin]; Livalo [pitavastatin]; Pravastatin sodium; and Simvastatin   Social History   Social History  . Marital status: Widowed    Spouse name: N/A  . Number of children: N/A  . Years of education: N/A   Social History Main Topics  . Smoking status: Never Smoker  . Smokeless tobacco: Never Used  . Alcohol use No  . Drug use: No  . Sexual activity: Not  Asked   Other Topics Concern  . None   Social History Narrative  . None     Family History:  The patient's family history includes Heart disease in her father and mother.   ROS:   Please see the history of present illness.    ROS All other systems reviewed and are negative.  No flowsheet data found.     PHYSICAL EXAM:   VS:  BP 126/82   Pulse 64   Ht 5\' 3"  (1.6 m)   Wt 188 lb 6.4 oz (85.5 kg)   LMP  (LMP Unknown)   SpO2 94%   BMI 33.37 kg/m    GEN: Well nourished, well developed, in no acute distress  HEENT: normal  Neck: no JVD, carotid bruits, or masses Cardiac: RRR; no rubs, or gallops,no edema.  Intact distal pulses bilaterally. 2/6 mid peaking systolic murmr at RUSB to LLSB Respiratory:  clear to auscultation bilaterally, normal work of breathing GI: soft, nontender, nondistended, + BS MS: no deformity or atrophy  Skin: warm and dry, no rash Neuro:  Alert and Oriented x 3, Strength and sensation are intact Psych: euthymic mood, full affect  Wt Readings from Last 3 Encounters:  04/22/16 188 lb 6.4 oz (85.5 kg)  10/20/15 189 lb 12.8 oz (86.1 kg)  08/06/15 190 lb 12.8 oz (86.5 kg)      Studies/Labs Reviewed:   EKG:  EKG is not ordered today.   Recent Labs: 07/17/2015: BUN 21; Creat 1.00; Hemoglobin 14.5; Platelets 256; Potassium 4.5; Sodium 139   Lipid Panel No results found for: CHOL, TRIG, HDL, CHOLHDL, VLDL, LDLCALC, LDLDIRECT  Additional studies/ records that were reviewed today include:  none    ASSESSMENT:    1. Aortic valve stenosis, etiology of cardiac valve disease unspecified   2. Coronary artery disease involving native coronary artery of native heart without angina pectoris   3. Chronic diastolic heart failure ([place])   4. SVT (supraventricular tachycardia) ([place])   5. Hypercholesteremia      PLAN:  In order of problems listed above:  1. Moderate aortic stenosis - echo a year ago showed a mean AV gradient of 85mmHg.  I will repeat  the echo to make sure this as not progressed. She is not symptomatic except for walking long distances which is stable.  I encouraged her to let me know if she has worsening of her DOE, exertional fatigue, CP, LE edema or syncope which could be a sign that her AS is progressing.  2. ASCAD - nonobstructive with 20% LAD and otherwise normal coronary arteries.  She will continue on ASA. 3. Chronic diastolic CHF - she appears euvolemic on exam but does get SOB when walking long distances. Her weight is stable.  She will continue on  Lasix. I will check a BNP and BMET.  Her O2  sats are 94% on RA and drop to 90% with ambulation.  I will get PFTs with DLCO - I suspect that she has restrictive lung disease from obesity. 4. SVT - this has not reoccurred. 5. Hyperlipidemia with LDL goal < 70.  She is statin intolerant so I will refer her to lipid clinic to see if she is a candidate for PCSK 9 drug.     Medication Adjustments/Labs and Tests Ordered: Current medicines are reviewed at length with the patient today.  Concerns regarding medicines are outlined above.  Medication changes, Labs and Tests ordered today are listed in the Patient Instructions below.  There are no Patient Instructions on file for this visit.   Signed, Fransico Him, MD  04/22/2016 8:20 AM    [facility] [place], [place], [place]  [address] Phone: [phone]; Fax: [phone]

## 2016-04-22 NOTE — Patient Instructions (Addendum)
Medication Instructions:  Your physician recommends that you continue on your current medications as directed. Please refer to the Current Medication list given to you today.   Labwork: TODAY: BMET, BNP  Testing/Procedures: Your physician has recommended that you have a pulmonary function test. Pulmonary Function Tests are a group of tests that measure how well air moves in and out of your lungs.   Your physician has requested that you have an echocardiogram in May, 2018. Echocardiography is a painless test that uses sound waves to create images of your heart. It provides your doctor with information about the size and shape of your heart and how well your heart's chambers and valves are working. This procedure takes approximately one hour. There are no restrictions for this procedure.  Follow-Up: You have been referred to LIPID CLINIC.  Your physician wants you to follow-up in: 6 months with Dr. Theodosia Blender assistant. You will receive a reminder letter in the mail two months in advance. If you don't receive a letter, please call our office to schedule the follow-up appointment.   Your physician wants you to follow-up in: 1 year with Dr. Radford Pax. You will receive a reminder letter in the mail two months in advance. If you don't receive a letter, please call our office to schedule the follow-up appointment.   Any Other Special Instructions Will Be Listed Below (If Applicable).     If you need a refill on your cardiac medications before your next appointment, please call your pharmacy.

## 2016-05-11 ENCOUNTER — Ambulatory Visit (HOSPITAL_COMMUNITY): Payer: Medicare Other | Attending: Cardiovascular Disease

## 2016-05-11 ENCOUNTER — Ambulatory Visit (INDEPENDENT_AMBULATORY_CARE_PROVIDER_SITE_OTHER): Payer: Medicare Other | Admitting: Pharmacist

## 2016-05-11 ENCOUNTER — Other Ambulatory Visit: Payer: Self-pay

## 2016-05-11 DIAGNOSIS — I350 Nonrheumatic aortic (valve) stenosis: Secondary | ICD-10-CM

## 2016-05-11 DIAGNOSIS — E785 Hyperlipidemia, unspecified: Secondary | ICD-10-CM | POA: Diagnosis not present

## 2016-05-11 DIAGNOSIS — E7800 Pure hypercholesterolemia, unspecified: Secondary | ICD-10-CM

## 2016-05-11 DIAGNOSIS — I509 Heart failure, unspecified: Secondary | ICD-10-CM | POA: Insufficient documentation

## 2016-05-11 DIAGNOSIS — I2510 Atherosclerotic heart disease of native coronary artery without angina pectoris: Secondary | ICD-10-CM | POA: Diagnosis not present

## 2016-05-11 DIAGNOSIS — Z853 Personal history of malignant neoplasm of breast: Secondary | ICD-10-CM | POA: Diagnosis not present

## 2016-05-11 DIAGNOSIS — I080 Rheumatic disorders of both mitral and aortic valves: Secondary | ICD-10-CM | POA: Insufficient documentation

## 2016-05-11 NOTE — Patient Instructions (Addendum)
Zetia (ezetimibe) - daily oral medication NOT in statin family - blocks absorption  Repatha or Praluent - PCSK9 inhibitor - injectable therapy - twice monthly  [phone] - Georgina Peer or Megan    Cholesterol Cholesterol is a fat. Your body needs a small amount of cholesterol. Cholesterol (plaque) may build up in your blood vessels (arteries). That makes you more likely to have a heart attack or stroke. You cannot feel your cholesterol level. Having a blood test is the only way to find out if your level is high. Keep your test results. Work with your doctor to keep your cholesterol at a good level. What do the results mean?  Total cholesterol is how much cholesterol is in your blood.  LDL is bad cholesterol. This is the type that can build up. Try to have low LDL.  HDL is good cholesterol. It cleans your blood vessels and carries LDL away. Try to have high HDL.  Triglycerides are fat that the body can store or burn for energy. What are good levels of cholesterol?  Total cholesterol below 200.  LDL below 100 is good for people who have health risks. LDL below 70 is good for people who have very high risks.  HDL above 40 is good. It is best to have HDL of 60 or higher.  Triglycerides below 150. How can I lower my cholesterol? Diet  Follow your diet program as told by your doctor.  Choose fish, white meat chicken, or [place] that is roasted or baked. Try not to eat red meat, fried foods, sausage, or lunch meats.  Eat lots of fresh fruits and vegetables.  Choose whole grains, beans, pasta, potatoes, and cereals.  Choose olive oil, corn oil, or canola oil. Only use small amounts.  Try not to eat butter, mayonnaise, shortening, or palm kernel oils.  Try not to eat foods with trans fats.  Choose low-fat or nonfat dairy foods.  Drink skim or nonfat milk.  Eat low-fat or nonfat yogurt and cheeses.  Try not to drink whole milk or cream.  Try not to eat ice cream, egg yolks, or  full-fat cheeses.  Healthy desserts include angel food cake, ginger snaps, animal crackers, hard candy, popsicles, and low-fat or nonfat frozen yogurt. Try not to eat pastries, cakes, pies, and cookies. Exercise  Follow your exercise program as told by your doctor.  Be more active. Try gardening, walking, and taking the stairs.  Ask your doctor about ways that you can be more active. Medicine  Take over-the-counter and prescription medicines only as told by your doctor. This information is not intended to replace advice given to you by your health care provider. Make sure you discuss any questions you have with your health care provider. Document Released: 03/19/2008 Document Revised: 07/23/2015 Document Reviewed: 07/03/2015 Elsevier Interactive Patient Education  2017 [employer].

## 2016-05-11 NOTE — Progress Notes (Signed)
Patient ID: [name]                 DOB: Dec 06, 1931                    MRN: [id number]     HPI: [name] is a 83 y.o. female patient of Dr. Radford Pax that presents today for lipid evaluation. PMH includes rheumatic fever as a child, moderate AS with mild AI, SVT, diastolic dysfunction with chronic diastolic CHF and ASCVD. She has been intolerant to statins in the past.  She presents today and states that she has had so much difficulty with cholesterol agents that she is not really willing to try another cholesterol medicine (statin or not). She states she will allow me to discuss options with her, but she will need to go home and discuss any option with her daughter (who is a Marine scientist) and her PCP (whom she sees in several months) before pursuing treatment. She states she likely will decide not to pursue treatment at all, but she will at least consider options.   She states she believes that the lipid panel from 2015 is the most recent panel performed. She declines having another panel ordered until she decides what she would like to do about treatment.   She has not tried Zetia that she is aware.   Risk Factors: CAD, age LDL Goal: <70, nonHDL <100  Current Medications:  Intolerances: atorvastatin 10/2011 - muscle aches, Livalo 0.5mg  daily - leg pain, pravastatin - myalgias, simvastatin 06/2011 - elevated LFTs, Crestor daily - muscle aches, fenofibrate 09/2011 - rash  Diet: Most meals from out. She mostly eats salads from out with baked/grilled chicken. Red meat about once per week. She tries to choose leaner cuts of meat. Rare fried foods. Does eat a lot of vegetables. She does endorse high carb intake. She eats bread and crackers a lot. She does like sweets as well.   Exercise: No formal exercise. She is active around the house and out and about.   Family History: The patient's family history includes Heart disease in her father and mother.   Social History: No tobacco products  and no alcohol.   Labs: 02/02/13 TC 226, TG 98, HDL 56, LDL 151, nonHDL 170  Past Medical History:  Diagnosis Date  . Aortic stenosis    moderate by cath 07/2015  . Atypical chest pain   . Breast cancer, right breast ([place])   . CAD (coronary artery disease), native coronary artery 10/20/2015   20% mid LAD  . Cancer ([place]) 2006   SURGERY AND CHEMO AND RADIATION   . CKD (chronic kidney disease), stage III   . Diastolic dysfunction   . Hypercholesteremia   . Hypothyroidism   . Osteopenia   . PUD (peptic ulcer disease)   . Rheumatic fever   . SVT (supraventricular tachycardia) [facility]) October 2014   converted with vagal maneuver  . Vitamin D deficiency     Current Outpatient Prescriptions on File Prior to Visit  Medication Sig Dispense Refill  . aspirin EC 81 MG tablet Take 81 mg by mouth daily.    . Calcium Carbonate-Vitamin D (CALCIUM + D PO) Take 1 tablet by mouth daily.    . Coenzyme Q10 (COQ-10 PO) Take 1 tablet by mouth daily.     . Cyanocobalamin (VITAMIN B-12 PO) Take 1 tablet by mouth daily.    . fish oil-omega-3 fatty acids 1000 MG capsule Take 1 g by mouth  daily.    . fluticasone (FLONASE) 50 MCG/ACT nasal spray Place 1 spray into both nostrils daily.    . furosemide (LASIX) 20 MG tablet Take 1 tablet (20 mg total) by mouth every evening. 90 tablet 3  . lansoprazole (PREVACID) 30 MG capsule Take 30 mg by mouth daily.    Marland Kitchen levothyroxine (SYNTHROID, LEVOTHROID) 112 MCG tablet Take 112 mcg by mouth daily before breakfast.    . magnesium gluconate (MAGONATE) 500 MG tablet Take 500 mg by mouth 2 (two) times daily.     . Multiple Vitamin (MULTIVITAMIN WITH MINERALS) TABS tablet Take 1 tablet by mouth daily.    . potassium chloride (K-DUR,KLOR-CON) 10 MEQ tablet Take 1 tablet (10 mEq total) by mouth daily. 90 tablet 3  . Vitamin D, Ergocalciferol, (DRISDOL) 50000 UNITS CAPS capsule Take 50,000 Units by mouth every 7 (seven) days. Saturday    . [DISCONTINUED] metoprolol succinate  (TOPROL-XL) 25 MG 24 hr tablet Take 1 tablet (25 mg total) by mouth daily. 30 tablet 0   No current facility-administered medications on file prior to visit.     Allergies  Allergen Reactions  . Atorvastatin     Myalgias 10/2011  . Compazine [Prochlorperazine]     unknown  . Fenofibrate     Rash 09/2011  . Floxin [Ofloxacin]     unknown  . Livalo [Pitavastatin] Other (See Comments)    Leg pain  . Pravastatin Sodium     mylgias 09/2011  . Simvastatin     Elevated lft's 06/2011    Assessment/Plan: Hyperlipidemia: LDL not at goal on panel from 3 years ago. Lengthy discussion about treatment options. She is not interested in participating in a clinical trial. Discussed Zetia and she is most interested in this option despite discussion on no evidence to use alone. Also discussed and provided information for PCSK9i therapy. She will discuss with her daughter and call with treatment decision and so we can schedule baseline lipid panel.   Thank you,  Lelan Pons. Patterson Hammersmith, [facility]  05/11/2016 8:17 AM

## 2016-05-12 ENCOUNTER — Telehealth: Payer: Self-pay | Admitting: Cardiology

## 2016-05-12 DIAGNOSIS — I350 Nonrheumatic aortic (valve) stenosis: Secondary | ICD-10-CM

## 2016-05-12 NOTE — Telephone Encounter (Signed)
Informed patient of results and verbal understanding expressed.  GXT ordered and scheduled 5/17. Reviewed instructions with patient. She was grateful for call and agrees with treatment plan.

## 2016-05-12 NOTE — Telephone Encounter (Signed)
New message    Pt is returning your call about the echo results

## 2016-05-12 NOTE — Telephone Encounter (Signed)
-----   Message from Sueanne Margarita, MD sent at 05/11/2016 10:17 PM EDT ----- I would like to get an ETT to evaluate for exercise intolerance and symptoms related to AS

## 2016-05-13 ENCOUNTER — Telehealth: Payer: Self-pay | Admitting: Cardiology

## 2016-05-13 NOTE — Telephone Encounter (Signed)
I spoke with [name]- we discussed the reasoning for the patient having her GXT done in relation to the status of her aortic valve.  She voices understanding.

## 2016-05-13 NOTE — Telephone Encounter (Signed)
I left a message for [name] to call.

## 2016-05-13 NOTE — Telephone Encounter (Signed)
New Message    Pt daughter is concerned why her mother needs to have exercise stress test ?

## 2016-05-20 ENCOUNTER — Ambulatory Visit (INDEPENDENT_AMBULATORY_CARE_PROVIDER_SITE_OTHER): Payer: Medicare Other

## 2016-05-20 DIAGNOSIS — I350 Nonrheumatic aortic (valve) stenosis: Secondary | ICD-10-CM | POA: Diagnosis not present

## 2016-05-20 LAB — EXERCISE TOLERANCE TEST
CHL CUP STRESS STAGE 1 GRADE: 0 %
CHL CUP STRESS STAGE 1 HR: 67 {beats}/min
CHL CUP STRESS STAGE 2 GRADE: 0 %
CHL CUP STRESS STAGE 2 HR: 74 {beats}/min
CHL CUP STRESS STAGE 2 SPEED: 1 mph
CHL CUP STRESS STAGE 3 HR: 74 {beats}/min
CHL CUP STRESS STAGE 3 SPEED: 1 mph
CHL CUP STRESS STAGE 4 DBP: 68 mmHg
CHL CUP STRESS STAGE 4 GRADE: 0 %
CHL CUP STRESS STAGE 4 SPEED: 1.7 mph
CHL CUP STRESS STAGE 5 HR: 126 {beats}/min
CHL CUP STRESS STAGE 5 SPEED: 1.7 mph
CHL CUP STRESS STAGE 6 GRADE: 5.1 %
CHL CUP STRESS STAGE 7 DBP: 76 mmHg
CHL CUP STRESS STAGE 7 GRADE: 0 %
CHL CUP STRESS STAGE 7 HR: 100 {beats}/min
CHL CUP STRESS STAGE 8 DBP: 80 mmHg
CSEPED: 6 min
CSEPEW: 3.4 METS
CSEPPHR: 126 {beats}/min
CSEPPMHR: 91 %
Exercise duration (sec): 0 s
MPHR: 138 {beats}/min
Percent HR: 92 %
RPE: 15
Rest HR: 61 {beats}/min
Stage 1 DBP: 70 mmHg
Stage 1 SBP: 143 mmHg
Stage 1 Speed: 0 mph
Stage 3 Grade: 0 %
Stage 4 HR: 106 {beats}/min
Stage 4 SBP: 106 mmHg
Stage 5 Grade: 5 %
Stage 6 HR: 126 {beats}/min
Stage 6 Speed: 1.7 mph
Stage 7 SBP: 170 mmHg
Stage 7 Speed: 0 mph
Stage 8 Grade: 0 %
Stage 8 HR: 73 {beats}/min
Stage 8 SBP: 134 mmHg
Stage 8 Speed: 0 mph

## 2016-05-25 ENCOUNTER — Telehealth: Payer: Self-pay | Admitting: Cardiology

## 2016-05-25 NOTE — Telephone Encounter (Signed)
New message       Pt request a callback from the nurse.  She would not tell me what she wanted

## 2016-05-25 NOTE — Telephone Encounter (Signed)
Left message to call back  

## 2016-05-26 NOTE — Telephone Encounter (Signed)
Patient called to get results of stress test. Informed patient that Dr. Radford Pax is still reviewing results and making a plan for her. She understands she will be called as soon as Dr. Radford Pax sends results to Nursing. She was grateful for call.

## 2016-05-28 ENCOUNTER — Telehealth: Payer: Self-pay | Admitting: Cardiology

## 2016-05-28 NOTE — Telephone Encounter (Signed)
°  Follow Up   Pt is calling to follow up on stress test results. Please call.

## 2016-05-28 NOTE — Telephone Encounter (Signed)
I discussed results of stress test with patient.  Her AS has progressed on echo and now mean AVG is 58mmHg with peak velocity 416cm/s and AVA by VTI 1cm2.  She has noticed increased DOE over the past year but still able to do ADLs. Her ETT showed no ischemia and no drop in BP with exercise but only able to do 6 mets on modified Bruce which is similar to 3.8 mets on standard Bruce protocol.  I have  Discussed natural history of AS with her and I am concerned that her worsening DOE may be due to her AS.  I am going to refer her to Dr. Burt Knack to discuss whether she would be a candidate for TAVR.  She is in agreement with this.  Please set up appt with Dr. Burt Knack.

## 2016-05-28 NOTE — Telephone Encounter (Signed)
Called patient about stress test results. Informed patient that we would call her with final test results when they have been reviewed by Dr. Radford Pax.  Patient stated she is very upset that she had not been notified yet. Informed patient that the test has been read by another cardiologist and if there was any cause for concern that she would have been notified by now. Encouraged patient that there should be no cause of alarm and that this message would be sent to Dr. Radford Pax right away. Patient verbalized understanding.

## 2016-06-01 NOTE — Telephone Encounter (Signed)
Left message on machine for pt to contact the office in regards to scheduling appointment with Dr Burt Knack for TAVR consult.

## 2016-06-01 NOTE — Telephone Encounter (Signed)
Patient has been scheduled 6/18 with Dr. Burt Knack.

## 2016-06-01 NOTE — Telephone Encounter (Signed)
I spoke with the pt and she requested TAVR appointment on 06/21/16 with Dr Burt Knack. Appointment scheduled.

## 2016-06-02 ENCOUNTER — Encounter: Payer: Self-pay | Admitting: Cardiovascular Disease

## 2016-06-17 ENCOUNTER — Ambulatory Visit (INDEPENDENT_AMBULATORY_CARE_PROVIDER_SITE_OTHER): Payer: Medicare Other | Admitting: Internal Medicine

## 2016-06-17 DIAGNOSIS — R0602 Shortness of breath: Secondary | ICD-10-CM

## 2016-06-17 LAB — PULMONARY FUNCTION TEST
FEF 25-75 Post: 0.3 L/sec
FEF 25-75 Pre: 0.34 L/sec
FEF2575-%CHANGE-POST: -12 %
FEF2575-%PRED-POST: 27 %
FEF2575-%Pred-Pre: 31 %
FEV1-%CHANGE-POST: -3 %
FEV1-%PRED-POST: 48 %
FEV1-%Pred-Pre: 50 %
FEV1-POST: 0.77 L
FEV1-Pre: 0.79 L
FEV1FVC-%CHANGE-POST: 8 %
FEV1FVC-%Pred-Pre: 84 %
FEV6-%Change-Post: -8 %
FEV6-%PRED-PRE: 62 %
FEV6-%Pred-Post: 56 %
FEV6-PRE: 1.25 L
FEV6-Post: 1.14 L
FEV6FVC-%Change-Post: 2 %
FEV6FVC-%PRED-PRE: 102 %
FEV6FVC-%Pred-Post: 105 %
FVC-%Change-Post: -11 %
FVC-%PRED-PRE: 60 %
FVC-%Pred-Post: 53 %
FVC-POST: 1.14 L
FVC-PRE: 1.29 L
POST FEV1/FVC RATIO: 67 %
POST FEV6/FVC RATIO: 99 %
PRE FEV6/FVC RATIO: 97 %
Pre FEV1/FVC ratio: 61 %
RV % PRED: 116 %
RV: 2.67 L
TLC % pred: 90 %
TLC: 4.18 L

## 2016-06-17 NOTE — Progress Notes (Signed)
PFT done today. 

## 2016-06-21 ENCOUNTER — Encounter: Payer: Self-pay | Admitting: Cardiovascular Disease

## 2016-06-21 ENCOUNTER — Ambulatory Visit (INDEPENDENT_AMBULATORY_CARE_PROVIDER_SITE_OTHER): Payer: Medicare Other | Admitting: Cardiovascular Disease

## 2016-06-21 VITALS — BP 134/70 | HR 60 | Ht 63.0 in | Wt 187.4 lb

## 2016-06-21 DIAGNOSIS — I2510 Atherosclerotic heart disease of native coronary artery without angina pectoris: Secondary | ICD-10-CM | POA: Diagnosis not present

## 2016-06-21 DIAGNOSIS — I350 Nonrheumatic aortic (valve) stenosis: Secondary | ICD-10-CM

## 2016-06-21 NOTE — Patient Instructions (Addendum)
Medication Instructions:  Your physician recommends that you continue on your current medications as directed. Please refer to the Current Medication list given to you today.  Labwork: Your physician recommends that you have lab work today: BMP  Testing/Procedures: Please arrive in Admitting at [facility] on Wednesday, June 30, 2016 at 9:00 AM 1. No solid foods, No caffeine and No smoking 4 hours prior to CT. 2. No herbal supplements by mouth 24 hours prior to test.  Your physician has requested that you have cardiac CT. Cardiac computed tomography (CT) is a painless test that uses an x-ray machine to take clear, detailed pictures of your heart. For further information please visit [email or web address].   Non-Cardiac CT Angiography (CTA), is a special type of CT scan that uses a computer to produce multi-dimensional views of major blood vessels throughout the body. In CT angiography, a contrast material is injected through an IV to help visualize the blood vessels (CTA chest/abdomen/pelvis)  Your physician has requested that you have a carotid duplex. This test is an ultrasound of the carotid arteries in your neck. It looks at blood flow through these arteries that supply the brain with blood. Allow one hour for this exam. There are no restrictions or special instructions.  You have been referred for outpatient physical therapy evaluation. (We will contact you with this appointment) [address], [place], [place] [address]  Follow-Up: You have been referred to Dr Roxy Manns and Dr Cyndia Bent at [facility] for further evaluation of aortic stenosis (TAVR).  Dr Cyndia Bent July 06, 2016 at 4:00 PM, check in at 3:45 PM   Any Other Special Instructions Will Be Listed Below (If Applicable).     If you need a refill on your cardiac medications before your next appointment, please call your pharmacy.

## 2016-06-21 NOTE — Progress Notes (Signed)
Cardiology Office Note Date:  06/21/2016   ID:  CHARMANE George, DOB 1931-04-04, MRN [id number]  PCP:  Janice Arabian, MD  Cardiologist:  Janice Him, MD  Chief Complaint  Patient presents with  . Aortic Stenosis    TAVR consult      History of Present Illness: [name] is a 83 y.o. female who presents for evaluation of aortic stenosis, referred by Dr [name].  The patient is here with her daughter today. She has been followed for a heart murmur and aortic stenosis now for many years. She's had the murmur since she was a teenager.   Over the past year she has become increasingly short of breath with walking. Also reports shortness of breath bending forward which has been worse recently. She denies chest pain, chest pressure, lightheadedness, or syncope. She's had palpitations but no sustained events over recent years. Also reports a more frequent cough than in the past.   She's also limited by hip and knee arthritis. She's had 2 hip replacements and one knee replacement. She's retired as an Engineer, mining. She's been a widow for 10 years, originally from [place], [place]. She sees a dentist regularly and hasn't had problems with her teeth.   Past Medical History:  Diagnosis Date  . Aortic stenosis    moderate by cath 07/2015  . Atypical chest pain   . Breast cancer, right breast ([place])   . CAD (coronary artery disease), native coronary artery 10/20/2015   20% mid LAD  . Cancer ([place]) 2006   SURGERY AND CHEMO AND RADIATION   . CKD (chronic kidney disease), stage III   . Diastolic dysfunction   . Hypercholesteremia   . Hypothyroidism   . Osteopenia   . PUD (peptic ulcer disease)   . Rheumatic fever   . SVT (supraventricular tachycardia) [facility]) October 2014   converted with vagal maneuver  . Vitamin D deficiency     Past Surgical History:  Procedure Laterality Date  . BREAST SURGERY     RIGHT LUMPECTOMY AND REMOVAL OF LYMPH NODES  . CARDIAC  CATHETERIZATION N/A 07/18/2015   Procedure: Right/Left Heart Cath and Coronary Angiography;  Surgeon: [name], MD;  Location: [facility];  Service: Cardiovascular;  Laterality: N/A;  . CHOLECYSTECTOMY N/A 08/06/2014   Procedure: LAPAROSCOPIC CHOLECYSTECTOMY ;  Surgeon: Rolm Bookbinder, MD;  Location: [facility];  Service: General;  Laterality: N/A;  . REPLACEMENT TOTAL KNEE Right 2009  . THYROID SURGERY     NODULE REMOVED  . TOTAL HIP ARTHROPLASTY Left 2003  . TOTAL HIP ARTHROPLASTY Right 08/28/2013   Procedure: RIGHT TOTAL HIP ARTHROPLASTY ANTERIOR APPROACH;  Surgeon: Mauri Pole, MD;  Location: [facility];  Service: Orthopedics;  Laterality: Right;    Current Outpatient Prescriptions  Medication Sig Dispense Refill  . aspirin EC 81 MG tablet Take 81 mg by mouth daily.    . Calcium Carbonate-Vitamin D (CALCIUM + D PO) Take 1 tablet by mouth daily.    . chlorpheniramine (CHLOR-TRIMETON) 4 MG tablet Take 4 mg by mouth daily.    . Coenzyme Q10 (COQ-10 PO) Take 1 tablet by mouth daily.     . Cyanocobalamin (VITAMIN B-12 PO) Take 1 tablet by mouth daily.    . fish oil-omega-3 fatty acids 1000 MG capsule Take 1 g by mouth daily.    . furosemide (LASIX) 20 MG tablet Take 1 tablet (20 mg total) by mouth every evening. 90 tablet 3  . lansoprazole (PREVACID)  30 MG capsule Take 30 mg by mouth daily.    Marland Kitchen levothyroxine (SYNTHROID, LEVOTHROID) 112 MCG tablet Take 112 mcg by mouth daily before breakfast.    . magnesium gluconate (MAGONATE) 500 MG tablet Take 500 mg by mouth 2 (two) times daily.     . Multiple Vitamin (MULTIVITAMIN WITH MINERALS) TABS tablet Take 1 tablet by mouth daily.    . potassium chloride (K-DUR,KLOR-CON) 10 MEQ tablet Take 1 tablet (10 mEq total) by mouth daily. 90 tablet 3  . Vitamin D, Ergocalciferol, (DRISDOL) 50000 UNITS CAPS capsule Take 50,000 Units by mouth every 7 (seven) days. Saturday     No current facility-administered medications for this visit.      Allergies:   Atorvastatin; Compazine [prochlorperazine]; Fenofibrate; Floxin [ofloxacin]; Livalo [pitavastatin]; Pravastatin sodium; and Simvastatin   Social History:  The patient  reports that she has never smoked. She has never used smokeless tobacco. She reports that she does not drink alcohol or use drugs.   Family History:  The patient's  family history includes Heart disease in her father and mother.    ROS:  Please see the history of present illness.  Otherwise, review of systems is positive for knee and hip pain.  All other systems are reviewed and negative.    PHYSICAL EXAM: VS:  BP 134/70   Pulse 60   Ht 5\' 3"  (1.6 m)   Wt 187 lb 6.4 oz (85 kg)   LMP  (LMP Unknown)   BMI 33.20 kg/m  , BMI Body mass index is 33.2 kg/m. GEN: Well nourished, well developed, pleasant elderly woman in no acute distress  HEENT: normal  Neck: no JVD, no masses. BL carotid bruits Cardiac: RRR with 4/6 harsh systolic murmur at the RUSB             Respiratory:  clear to auscultation bilaterally, normal work of breathing GI: soft, nontender, nondistended, + BS MS: no deformity or atrophy  Ext: no pretibial edema, pedal pulses 2+= bilaterally Skin: warm and dry, no rash Neuro:  Strength and sensation are intact Psych: euthymic mood, full affect  EKG:  EKG is not ordered today.  Recent Labs: 07/17/2015: Hemoglobin 14.5; Platelets 256 04/22/2016: BUN 16; Creatinine, Ser 0.89; NT-Pro BNP 307; Potassium 4.5; Sodium 138   Lipid Panel  No results found for: CHOL, TRIG, HDL, CHOLHDL, VLDL, LDLCALC, LDLDIRECT    Wt Readings from Last 3 Encounters:  06/21/16 187 lb 6.4 oz (85 kg)  04/22/16 188 lb 6.4 oz (85.5 kg)  10/20/15 189 lb 12.8 oz (86.1 kg)     Cardiac Studies Reviewed: GXT 05/20/2016: Study Highlights     Blood pressure demonstrated a normal response to exercise.  There was no ST segment deviation noted during stress.   No ischemia. Moderately impaired exercise capacity,  the patient walked for 6 minutes, however with modified Bruce protocol this represents only 3.4 METS.    Echo 05/11/2016: Left ventricle:  The cavity size was normal. Wall thickness was normal. Systolic function was normal. The estimated ejection fraction was in the range of 55% to 60%. Wall motion was normal; there were no regional wall motion abnormalities.  ------------------------------------------------------------------- Aortic valve:   Trileaflet; moderately thickened, moderately calcified leaflets.  Doppler:   There was moderate to severe stenosis.   There was mild regurgitation.    VTI ratio of LVOT to aortic valve: 0.29. Valve area (VTI): 1 cm^2. Indexed valve area (VTI): 0.5 cm^2/m^2. Peak velocity ratio of LVOT to aortic valve: 0.25.  Valve area (Vmax): 0.86 cm^2. Indexed valve area (Vmax): 0.43 cm^2/m^2. Mean velocity ratio of LVOT to aortic valve: 0.26. Valve area (Vmean): 0.89 cm^2. Indexed valve area (Vmean): 0.45 cm^2/m^2.    Mean gradient (S): 37 mm Hg. Peak gradient (S): 69 mm Hg.  ------------------------------------------------------------------- Aorta:  Aortic root: The aortic root was normal in size. Ascending aorta: The ascending aorta was normal in size.  ------------------------------------------------------------------- Mitral valve:   Mildly thickened leaflets .  Doppler:  There was mild regurgitation.    Peak gradient (D): 6 mm Hg.  ------------------------------------------------------------------- Left atrium:  The atrium was mildly dilated.  ------------------------------------------------------------------- Right ventricle:  The cavity size was normal. Systolic function was normal.  ------------------------------------------------------------------- Pulmonic valve:   Poorly visualized.  The valve appears to be grossly normal.   Cusp separation was normal.  Doppler: Transvalvular velocity was within the normal range. There was  no regurgitation.  ------------------------------------------------------------------- Tricuspid valve:   Structurally normal valve.   Leaflet separation was normal.  Doppler:  Transvalvular velocity was within the normal range. There was no regurgitation.  ------------------------------------------------------------------- Pulmonary artery:    Systolic pressure could not be accurately estimated.  ------------------------------------------------------------------- Right atrium:  The atrium was mildly dilated.  ------------------------------------------------------------------- Pericardium:  There was no pericardial effusion.  ------------------------------------------------------------------- Systemic veins: Inferior vena cava: The vessel was normal in size. The respirophasic diameter changes were in the normal range (>= 50%), consistent with normal central venous pressure.  ------------------------------------------------------------------- Measurements   Left ventricle                            Value          Reference  LV ID, ED, PLAX chordal           (L)     42.1  mm       43 - 52  LV ID, ES, PLAX chordal                   26.5  mm       23 - 38  LV fx shortening, PLAX chordal            37    %        >=29  LV PW thickness, ED                       11.6  mm       ---------  IVS/LV PW ratio, ED                       1.25           <=1.3  Stroke volume, 2D                         99    ml       ---------  Stroke volume/bsa, 2D                     50    ml/m^2   ---------  LV e&', lateral                            10.1  cm/s     ---------  LV E/e&', lateral  12.28          ---------  LV e&', medial                             4.83  cm/s     ---------  LV E/e&', medial                           25.67          ---------  LV e&', average                            7.47  cm/s     ---------  LV E/e&', average                          16.61           ---------    Ventricular septum                        Value          Reference  IVS thickness, ED                         14.5  mm       ---------    LVOT                                      Value          Reference  LVOT ID, S                                21    mm       ---------  LVOT area                                 3.46  cm^2     ---------  LVOT ID                                   21    mm       ---------  LVOT peak velocity, S                     103   cm/s     ---------  LVOT mean velocity, S                     75.4  cm/s     ---------  LVOT VTI, S                               28.6  cm       ---------  LVOT peak gradient, S                     4     mm Hg    ---------  Stroke volume (SV), LVOT DP  99.1  ml       ---------  Stroke index (SV/bsa), LVOT DP            49.9  ml/m^2   ---------    Aortic valve                              Value          Reference  Aortic valve peak velocity, S             416   cm/s     ---------  Aortic valve mean velocity, S             292   cm/s     ---------  Aortic valve VTI, S                       98.5  cm       ---------  Aortic mean gradient, S                   37    mm Hg    ---------  Aortic peak gradient, S                   69    mm Hg    ---------  VTI ratio, LVOT/AV                        0.29           ---------  Aortic valve area, VTI                    1     cm^2     ---------  Aortic valve area/bsa, VTI                0.5   cm^2/m^2 ---------  Velocity ratio, peak, LVOT/AV             0.25           ---------  Aortic valve area, peak velocity          0.86  cm^2     ---------  Aortic valve area/bsa, peak               0.43  cm^2/m^2 ---------  velocity  Velocity ratio, mean, LVOT/AV             0.26           ---------  Aortic valve area, mean velocity          0.89  cm^2     ---------  Aortic valve area/bsa, mean               0.45  cm^2/m^2 ---------  velocity  Aortic regurg pressure half-time           400   ms       ---------    Aorta                                     Value          Reference  Aortic root ID, ED                        28    mm       ---------  Left atrium                               Value          Reference  LA ID, A-P, ES                            45    mm       ---------  LA ID/bsa, A-P                    (H)     2.27  cm/m^2   <=2.2  LA volume, S                              60    ml       ---------  LA volume/bsa, S                          30.2  ml/m^2   ---------  LA volume, ES, 1-p A4C                    48    ml       ---------  LA volume/bsa, ES, 1-p A4C                24.2  ml/m^2   ---------  LA volume, ES, 1-p A2C                    71    ml       ---------  LA volume/bsa, ES, 1-p A2C                35.8  ml/m^2   ---------    Mitral valve                              Value          Reference  Mitral E-wave peak velocity               124   cm/s     ---------  Mitral A-wave peak velocity               111   cm/s     ---------  Mitral deceleration time          (H)     370   ms       150 - 230  Mitral peak gradient, D                   6     mm Hg    ---------  Mitral E/A ratio, peak                    1.1            ---------    Right ventricle                           Value          Reference  RV s&', lateral, S  14.3  cm/s     ---------  Cardiac Cath 07/18/2015: Procedures   Right/Left Heart Cath and Coronary Angiography  Conclusion    Mid LAD lesion, 20% stenosed.   1. Mild non-obstructive CAD 2. Moderate aortic stenosis (peak to peak gradient 20 mmHg, AVA 1.1 cm2)  Recommendations: Medical management of CAD. Follow aortic stenosis with serial echocardiograms.    Indications   Aortic stenosis [I35.0 (ICD-10-CM)]  Abnormal nuclear stress test [R94.39 (VOZ-36-UY)]  Complications   Complications documented in old activity   Estimated blood loss <50 mL. Indication: 83 yo female with known moderate AS with  recent weakness and dyspnea. Nuclear stress test with possible ischemia.   Procedure: The risks, benefits, complications, treatment options, and expected outcomes were discussed with the patient. The patient and/or family concurred with the proposed plan, giving informed consent. The patient was brought to the cath lab after IV hydration was begun and oral premedication was given. The patient was further sedated with Versed and Fentanyl. There was an IV catheter present in the left antecubital vein. This area was prepped and draped in a sterile fashion. I then changed out this IV catheter for a 5 French sheath. A balloon tipped catheter was used to perform a right heart catheterization. The left wrist was assessed with a modified Allens test which was positive. The left wrist was prepped and draped in a sterile fashion. 1% lidocaine was used for local anesthesia. Using the modified Seldinger access technique, a 5 French sheath was placed in the left radial artery. 3 mg Verapamil was given through the sheath. 4500 units IV heparin was given. Standard diagnostic catheters were used to perform selective coronary angiography. I crossed the aortic valve with an AL-1 catheter and a straight wire. The sheath was removed from the right radial artery and a Terumo hemostasis band was applied at the arteriotomy site on the right wrist.   Sedation: During this procedure the patient is administered a total of Versed 1 mg and Fentanyl 25 mcg to achieve and maintain moderate conscious sedation. The patient's heart rate, blood pressure, and oxygen saturation are monitored continuously during the procedure. The period of conscious sedation is 49 minutes, of which I was present face-to-face 100% of this time.      Coronary Findings   Dominance: Right  Left Anterior Descending  . Vessel is large.  Mid LAD lesion, 20% stenosed. Discrete.  Second Diagonal Branch  The vessel is moderate in size.  Third Diagonal Branch   The vessel is small in size.  Left Circumflex  . Vessel is moderate in size.  First Obtuse Marginal Branch  The vessel is small in size.  Second Obtuse Marginal Branch  The vessel is small in size.  Third Obtuse Marginal Branch  The vessel is moderate in size.  Right Coronary Artery  . Vessel is large. Vessel is angiographically normal.  Right Posterior Descending Artery  The vessel is moderate in size.  Left Heart   Aortic Valve There is moderate aortic valve stenosis.    Coronary Diagrams   Diagnostic Diagram       Implants     No implant documentation for this case.  PACS Images   Show images for Cardiac catheterization   Link to Procedure Log   Procedure Log    Hemo Data    Most Recent Value  Fick Cardiac Output 4.75 L/min  Fick Cardiac Output Index 2.5 (L/min)/BSA  Aortic Mean Gradient 23.9 mmHg  Aortic Peak Gradient 20  mmHg  Aortic Valve Area 1.10  Aortic Value Area Index 0.58 cm2/BSA  RA A Wave 10 mmHg  RA V Wave 9 mmHg  RA Mean 7 mmHg  RV Systolic Pressure 38 mmHg  RV Diastolic Pressure 5 mmHg  RV EDP 9 mmHg  PA Systolic Pressure 42 mmHg  PA Diastolic Pressure 6 mmHg  PA Mean 21 mmHg  PW A Wave 16 mmHg  PW V Wave 15 mmHg  PW Mean 11 mmHg  AO Systolic Pressure 176 mmHg  AO Diastolic Pressure 60 mmHg  AO Mean 84 mmHg  LV Systolic Pressure 160 mmHg  LV Diastolic Pressure 8 mmHg  LV EDP 16 mmHg  Arterial Occlusion Pressure Extended Systolic Pressure 737 mmHg  Arterial Occlusion Pressure Extended Diastolic Pressure 61 mmHg  Arterial Occlusion Pressure Extended Mean Pressure 91 mmHg  Left Ventricular Apex Extended Systolic Pressure 106 mmHg  Left Ventricular Apex Extended Diastolic Pressure 8 mmHg  Left Ventricular Apex Extended EDP Pressure 18 mmHg  QP/QS 1  TPVR Index 8.41 HRUI  TSVR Index 33.64 HRUI  PVR SVR Ratio 0.13  TPVR/TSVR Ratio 0.25   STS RISK CALCULATOR: Procedure: AV Replacement  Risk of Mortality: 2.603%  Morbidity or  Mortality: 14.554%  Long Length of Stay: 6.916%  Short Length of Stay: 30.416%  Permanent Stroke: 1.551%  Prolonged Ventilation: 9.594%  DSW Infection: 0.254%  Renal Failure: 2.846%  Reoperation: 6.462%    ASSESSMENT AND PLAN: 83 year old woman was severe, stage DI, aortic stenosis: I have personally reviewed the patients echo images which demonstrate moderate calcification and severe restriction of aortic valve leaflets. Peak and mean gradients are as high as 69 and 43 mmHg with the peak systolic velocity of 269 cm/s. There is mild aortic insufficiency present. The patient's exam, symptoms, and echo findings all suggest severe aortic stenosis.  I have reviewed the natural history of aortic stenosis with the patient and her daughter who is present today. We have discussed the limitations of medical therapy and the poor prognosis associated with symptomatic aortic stenosis. We have reviewed potential treatment options, including palliative medical therapy, conventional surgical aortic valve replacement, and transcatheter aortic valve replacement. We discussed treatment options in the context of this patient's specific comorbid medical conditions.   While the patient's predicted risk of mortality with conventional surgical aortic valve replacement is slightly less than 3%, I suspect her actual risk is iron than that considering her age of 62. She's also had multiple joint replacements and I think her recovery from TAVR would likely be much easier for her. Her daughter is a surgical OR nurse with a lot of experience in cardiac surgery. She is present for today's discussion. I have reviewed specific considerations around TAVR with them. We discussed procedural expectations, necessary workup, and potential complications. We'll plan on proceeding with a gated cardiac CTA and a CTA of the chest/abdomen/pelvis to evaluate the patient's aortic annulus and specific anatomy for TAVR. After the patient's CT scans  are completed, she will be referred for formal cardiac surgical evaluation. She just had a heart catheterization one year ago and had mild nonobstructive CAD. This probably does not need to be repeated.  Current medicines are reviewed with the patient today.  The patient does not have concerns regarding medicines.  Labs/ tests ordered today include:  No orders of the defined types were placed in this encounter.  Disposition:   As outlined above  Signed, Sherren Mocha, MD  06/21/2016 12:29 PM    [facility]  [address], [place], [place]  [address] Phone: [phone]; Fax: [phone]

## 2016-06-22 LAB — BASIC METABOLIC PANEL
BUN/Creatinine Ratio: 21 (ref 12–28)
BUN: 19 mg/dL (ref 8–27)
CO2: 25 mmol/L (ref 20–29)
Calcium: 10.4 mg/dL — ABNORMAL HIGH (ref 8.7–10.3)
Chloride: 94 mmol/L — ABNORMAL LOW (ref 96–106)
Creatinine, Ser: 0.9 mg/dL (ref 0.57–1.00)
GFR, EST AFRICAN AMERICAN: 69 mL/min/{1.73_m2} (ref >59)
GFR, EST NON AFRICAN AMERICAN: 60 mL/min/{1.73_m2} (ref >59)
Glucose: 86 mg/dL (ref 65–99)
Potassium: 4.6 mmol/L (ref 3.5–5.2)
SODIUM: 137 mmol/L (ref 134–144)

## 2016-06-30 ENCOUNTER — Ambulatory Visit (HOSPITAL_COMMUNITY)
Admission: RE | Admit: 2016-06-30 | Discharge: 2016-06-30 | Disposition: A | Discharge status: Home / Self Care | Payer: Medicare Other | Source: Ambulatory Visit | Attending: Cardiovascular Disease | Admitting: Cardiovascular Disease

## 2016-06-30 ENCOUNTER — Encounter: Payer: Self-pay | Admitting: Physical Therapy

## 2016-06-30 ENCOUNTER — Ambulatory Visit: Payer: Medicare Other | Attending: Family Medicine | Admitting: Physical Therapy

## 2016-06-30 ENCOUNTER — Encounter (HOSPITAL_COMMUNITY): Payer: Self-pay

## 2016-06-30 ENCOUNTER — Ambulatory Visit (HOSPITAL_BASED_OUTPATIENT_CLINIC_OR_DEPARTMENT_OTHER)
Admission: RE | Admit: 2016-06-30 | Discharge: 2016-06-30 | Disposition: A | Discharge status: Home / Self Care | Payer: Medicare Other | Source: Ambulatory Visit | Attending: Cardiovascular Disease | Admitting: Cardiovascular Disease

## 2016-06-30 DIAGNOSIS — I350 Nonrheumatic aortic (valve) stenosis: Secondary | ICD-10-CM | POA: Insufficient documentation

## 2016-06-30 DIAGNOSIS — R2689 Other abnormalities of gait and mobility: Secondary | ICD-10-CM | POA: Diagnosis not present

## 2016-06-30 DIAGNOSIS — Z96643 Presence of artificial hip joint, bilateral: Secondary | ICD-10-CM | POA: Insufficient documentation

## 2016-06-30 DIAGNOSIS — I6523 Occlusion and stenosis of bilateral carotid arteries: Secondary | ICD-10-CM | POA: Insufficient documentation

## 2016-06-30 DIAGNOSIS — Z952 Presence of prosthetic heart valve: Secondary | ICD-10-CM | POA: Diagnosis not present

## 2016-06-30 DIAGNOSIS — I700 Atherosclerosis of aorta: Secondary | ICD-10-CM | POA: Insufficient documentation

## 2016-06-30 LAB — VAS US CAROTID
LCCAPSYS: 74 cm/s
LEFT ECA DIAS: -1 cm/s
LEFT VERTEBRAL DIAS: -19 cm/s
LICADDIAS: -31 cm/s
LICAPDIAS: -26 cm/s
LICAPSYS: -88 cm/s
Left CCA dist dias: -18 cm/s
Left CCA dist sys: -68 cm/s
Left CCA prox dias: 16 cm/s
Left ICA dist sys: -150 cm/s
RIGHT ECA DIAS: -2 cm/s
RIGHT VERTEBRAL DIAS: 9 cm/s
Right CCA prox dias: 16 cm/s
Right CCA prox sys: 107 cm/s
Right cca dist sys: -96 cm/s

## 2016-06-30 MED ORDER — IOPAMIDOL (ISOVUE-370) INJECTION 76%
INTRAVENOUS | Status: AC
  Filled 2016-06-30: qty 50

## 2016-06-30 MED ORDER — IOPAMIDOL (ISOVUE-370) INJECTION 76%
INTRAVENOUS | Status: AC
  Administered 2016-06-30: 150 mL
  Filled 2016-06-30: qty 100

## 2016-06-30 NOTE — Progress Notes (Signed)
VASCULAR LAB PRELIMINARY  PRELIMINARY  PRELIMINARY  PRELIMINARY  Carotid duplex completed.    Preliminary report:  Bilateral :  40-59% internal carotid artery stenosis.  Vertebral artery flow is antegrade.  Alayza Pieper, RVS 06/30/2016, 1:12 PM

## 2016-06-30 NOTE — Therapy (Signed)
[place], [place], [address] Phone: [phone]   Fax:  [phone]  Physical Therapy Evaluation  Patient Details  Name: [name] MRN: [id number] Date of Birth: [birth year] Referring Provider: Dr. Sherren Mocha  Encounter Date: 06/30/2016      PT End of Session - 06/30/16 1255    Visit Number 1   PT Start Time 5462   PT Stop Time 1245   PT Time Calculation (min) 30 min      Past Medical History:  Diagnosis Date  . Aortic stenosis    moderate by cath 07/2015  . Atypical chest pain   . Breast cancer, right breast ([place])   . CAD (coronary artery disease), native coronary artery 10/20/2015   20% mid LAD  . Cancer ([place]) 2006   SURGERY AND CHEMO AND RADIATION   . CKD (chronic kidney disease), stage III   . Diastolic dysfunction   . Hypercholesteremia   . Hypothyroidism   . Osteopenia   . PUD (peptic ulcer disease)   . Rheumatic fever   . SVT (supraventricular tachycardia) [facility]) October 2014   converted with vagal maneuver  . Vitamin D deficiency     Past Surgical History:  Procedure Laterality Date  . BREAST SURGERY     RIGHT LUMPECTOMY AND REMOVAL OF LYMPH NODES  . CARDIAC CATHETERIZATION N/A 07/18/2015   Procedure: Right/Left Heart Cath and Coronary Angiography;  Surgeon: [name], MD;  Location: [facility];  Service: Cardiovascular;  Laterality: N/A;  . CHOLECYSTECTOMY N/A 08/06/2014   Procedure: LAPAROSCOPIC CHOLECYSTECTOMY ;  Surgeon: Rolm Bookbinder, MD;  Location: [facility];  Service: General;  Laterality: N/A;  . REPLACEMENT TOTAL KNEE Right 2009  . THYROID SURGERY     NODULE REMOVED  . TOTAL HIP ARTHROPLASTY Left 2003  . TOTAL HIP ARTHROPLASTY Right 08/28/2013   Procedure: RIGHT TOTAL HIP ARTHROPLASTY ANTERIOR APPROACH;  Surgeon: Mauri Pole, MD;  Location: [facility];  Service: Orthopedics;  Laterality: Right;    There were no vitals filed for this  visit.       Subjective Assessment - 06/30/16 1214    Subjective Pt reports long standing heart murmur that has been followed since a young age. Pt reports shortness of breath progressively worsened recently - notices when walking [place] as well as bending forward.    Patient Stated Goals to fix heart   Currently in Pain? No/denies            [facility] PT Assessment - 06/30/16 0001      Assessment   Medical Diagnosis severe arotic stenosis   Referring Provider Dr. Sherren Mocha   Onset Date/Surgical Date --  approximately 1 year     Precautions   Precautions --   Precaution Comments No BP RUE     Restrictions   Weight Bearing Restrictions No     Balance Screen   Has the patient fallen in the past 6 months No   Has the patient had a decrease in activity level because of a fear of falling?  No   Is the patient reluctant to leave their home because of a fear of falling?  No     Home Environment   Living Environment Private residence   [place] Access Level entry   [place] One level     Prior Function   Level of Independence Independent with community mobility without  device     Posture/Postural Control   Posture/Postural Control Postural limitations   Postural Limitations Rounded Shoulders;Forward head  mild     ROM / Strength   AROM / PROM / Strength AROM;Strength     AROM   Overall AROM Comments grossly WNL     Strength   Overall Strength Comments grossly 4/5 except L hip 4-/5   Strength Assessment Site Hand   Right/Left hand Right;Left   Right Hand Grip (lbs) 25  R hand dominant   Left Hand Grip (lbs) 20     Ambulation/Gait   Gait Comments No significant gait deviations noted. Pt's distance was limited by 25% for age/gender due to shortness of breath.           [facility] Pre-Surgical Assessment - 2016/07/16 0001    5 Meter Walk Test- trial 1 5 sec   5 Meter Walk Test- trial 2 6 sec.    5 Meter Walk  Test- trial 3 5 sec.   5 meter walk test average 5.33 sec   4 Stage Balance Test tolerated for:  2 sec.   4 Stage Balance Test Position 4   Sit To Stand Test- trial 1 13 sec.   ADL/IADL Independent with: Bathing;Dressing;Meal prep;Finances   ADL/IADL Needs Assistance with: [employer] work   ADL/IADL Fraility Index Vulnerable   6 Minute Walk- Baseline yes   BP (mmHg) 118/69   HR (bpm) 64   02 Sat (%RA) 95 %   Modified Borg Scale for Dyspnea 0- Nothing at all   Perceived Rate of Exertion (Borg) 6-   6 Minute Walk Post Test yes   BP (mmHg) 155/82   HR (bpm) 113   02 Sat (%RA) 93 %   Modified Borg Scale for Dyspnea 3- Moderate shortness of breath or breathing difficulty   Perceived Rate of Exertion (Borg) 10-   Aerobic Endurance Distance Walked 955          Objective measurements completed on examination: See above findings.                               Plan - 07-16-16 1256    Clinical Impression Statement see below   PT Frequency One time visit   Consulted and Agree with Plan of Care Patient     Clinical Impression Statement: Pt is an 83 yo female presenting to OP PT for evaluation prior to possible TAVR surgery due to severe aortic stenosis. Pt reports onset of shortness of breath with activity and bending over approximately 12 months ago. Symptoms are limiting her ability to walk community distances without moderate to severe shortness of breath. Pt presents with good ROM and strength, good balance and is not at high fall risk 4 stage balance test, good walking speed and fair aerobic endurance per 6 minute walk test. Pt ambulated a total of 955 feet in 6 minute walk. BP increased significantly with 6 minute walk test. Based on the Short Physical Performance Battery, patient has a frailty rating of 10/12 with </= 5/12 considered frail.   Patient demonstrated the following deficits and impairments:     Visit Diagnosis: Other abnormalities of gait and  mobility      G-Codes - 07-16-16 1256    Functional Assessment Tool Used (Outpatient Only) 6 minute walk 955'   Functional Limitation Mobility: Walking and moving around   Mobility: Walking and Moving Around Current Status ([id number]) At least 1  percent but less than 20 percent impaired, limited or restricted   Mobility: Walking and Moving Around Goal Status [phone]) At least 1 percent but less than 20 percent impaired, limited or restricted   Mobility: Walking and Moving Around Discharge Status [phone]) At least 1 percent but less than 20 percent impaired, limited or restricted       Problem List Patient Active Problem List   Diagnosis Date Noted  . CAD (coronary artery disease), native coronary artery 10/20/2015  . Abnormal stress test   . Weakness 05/08/2015  . Cholecystitis 08/05/2014  . Abnormal transaminases 08/05/2014  . Breast cancer, right breast ([place])   . PUD (peptic ulcer disease)   . Hypothyroidism   . Hypercholesteremia   . Obese 08/29/2013  . Expected blood loss anemia 08/29/2013  . S/P right THA, AA 08/28/2013  . Chronic diastolic heart failure ([place]) 02/13/2013  . Aortic stenosis   . SVT (supraventricular tachycardia) ([place]) 10/24/2012    [place], PT 06/30/2016, 12:57 PM  [facility] [address] [place], [place], [address] Phone: [phone]   Fax:  [phone]  Name: [name] MRN: [id number] Date of Birth: [birth year]

## 2016-07-06 ENCOUNTER — Encounter: Payer: Self-pay | Admitting: Surgery

## 2016-07-06 ENCOUNTER — Institutional Professional Consult (permissible substitution) (INDEPENDENT_AMBULATORY_CARE_PROVIDER_SITE_OTHER): Payer: Medicare Other | Admitting: Surgery

## 2016-07-06 VITALS — BP 128/71 | HR 63 | Resp 20 | Ht 63.0 in | Wt 183.0 lb

## 2016-07-06 DIAGNOSIS — I2510 Atherosclerotic heart disease of native coronary artery without angina pectoris: Secondary | ICD-10-CM | POA: Diagnosis not present

## 2016-07-06 DIAGNOSIS — I350 Nonrheumatic aortic (valve) stenosis: Secondary | ICD-10-CM | POA: Diagnosis not present

## 2016-07-08 ENCOUNTER — Other Ambulatory Visit: Payer: Self-pay | Admitting: *Deleted

## 2016-07-08 DIAGNOSIS — I350 Nonrheumatic aortic (valve) stenosis: Secondary | ICD-10-CM

## 2016-07-09 NOTE — Progress Notes (Signed)
[facility]  MULTIDISCIPLINARY HEART VALVE CLINIC  CARDIOTHORACIC SURGERY CONSULTATION REPORT  Referring Provider is Sueanne Margarita, MD PCP is Gaynelle Arabian, MD  Chief Complaint  Patient presents with  . Aortic Stenosis    Surgical eval on TAVR, review all studies    HPI:  The patient is an 83 year old woman with a hx of chronic kidney disease, hypercholesterolemia, hypothyroidism, history of right breast cancer s/p lumpectomy followed by chemo/XRT, 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 An echo in 05/2015 showed moderate AS with a mean gradient of 31 mm Hg and normal LV function. She was having some exertional fatigue at that time. She underwent a nuclear stress test on 07/14/2015 that showed ischemia in the mid and basal inferolateral walls. She says that she got very short of breath with the stress test. She then had a cardiac cath on 07/17/2016 showing mild non-obstructive disease in the mid LAD. The peak to peak AV gradient at that time was 20 mm Hg and the mean was 23.9 mm Hg. Since then she has had progressive shortness of breath and fatigue with exertion. She has no chest pain or dizziness. She has had frequently coughing. Her most recent echo on 05/11/2016 showed an LVEF of 55-60% with a mean AV gradient of 37 mm Hg and a peak of 69 mm Hg. She had an ETT performed by Dr. Radford Pax on 05/20/2016 which showed no ischemia with moderately impaired exercise capacity. She was referred to Dr. Burt Knack and underwent work up for TAVR.  She is widowed and lives independently in [place]. He daughter [name] is with her today and she is a long time OR nurse who used to work on our heart team. The patient has a lot of friends and keeps very busy.  Past Medical History:  Diagnosis Date  . Aortic stenosis    moderate by cath 07/2015  . Atypical chest pain   . Breast cancer, right breast ([place])   . CAD  (coronary artery disease), native coronary artery 10/20/2015   20% mid LAD  . Cancer ([place]) 2006   SURGERY AND CHEMO AND RADIATION   . CKD (chronic kidney disease), stage III   . Diastolic dysfunction   . Hypercholesteremia   . Hypothyroidism   . Osteopenia   . PUD (peptic ulcer disease)   . Rheumatic fever   . SVT (supraventricular tachycardia) [facility]) October 2014   converted with vagal maneuver  . Vitamin D deficiency     Past Surgical History:  Procedure Laterality Date  . BREAST SURGERY     RIGHT LUMPECTOMY AND REMOVAL OF LYMPH NODES  . CARDIAC CATHETERIZATION N/A 07/18/2015   Procedure: Right/Left Heart Cath and Coronary Angiography;  Surgeon: [name], MD;  Location: [facility];  Service: Cardiovascular;  Laterality: N/A;  . CHOLECYSTECTOMY N/A 08/06/2014   Procedure: LAPAROSCOPIC CHOLECYSTECTOMY ;  Surgeon: Rolm Bookbinder, MD;  Location: [place];  Service: General;  Laterality: N/A;  . REPLACEMENT TOTAL KNEE Right 2009  . THYROID SURGERY     NODULE REMOVED  . TOTAL HIP ARTHROPLASTY Left 2003  . TOTAL HIP ARTHROPLASTY Right 08/28/2013   Procedure: RIGHT TOTAL HIP ARTHROPLASTY ANTERIOR APPROACH;  Surgeon: Mauri Pole, MD;  Location: [facility];  Service: Orthopedics;  Laterality: Right;    Family History  Problem Relation Age of Onset  . Heart disease Mother   . Heart disease  Father     Social History   Social History  . Marital status: Widowed    Spouse name: N/A  . Number of children: N/A  . Years of education: N/A   Occupational History  . Not on file.   Social History Main Topics  . Smoking status: Never Smoker  . Smokeless tobacco: Never Used  . Alcohol use No  . Drug use: No  . Sexual activity: Not on file   Other Topics Concern  . Not on file   Social History Narrative  . No narrative on file    Current Outpatient Prescriptions  Medication Sig Dispense Refill  . aspirin EC 81 MG tablet Take 81 mg by mouth daily.    .  Calcium Carbonate-Vitamin D (CALCIUM + D PO) Take 1 tablet by mouth daily.    . chlorpheniramine (CHLOR-TRIMETON) 4 MG tablet Take 4 mg by mouth daily.    . Coenzyme Q10 (COQ-10 PO) Take 1 tablet by mouth daily.     . Cyanocobalamin (VITAMIN B-12 PO) Take 1 tablet by mouth daily.    . fish oil-omega-3 fatty acids 1000 MG capsule Take 1 g by mouth daily.    . furosemide (LASIX) 20 MG tablet Take 1 tablet (20 mg total) by mouth every evening. 90 tablet 3  . lansoprazole (PREVACID) 30 MG capsule Take 30 mg by mouth daily.    Marland Kitchen levothyroxine (SYNTHROID, LEVOTHROID) 112 MCG tablet Take 112 mcg by mouth daily before breakfast.    . magnesium gluconate (MAGONATE) 500 MG tablet Take 500 mg by mouth 2 (two) times daily.     . Multiple Vitamin (MULTIVITAMIN WITH MINERALS) TABS tablet Take 1 tablet by mouth daily.    . potassium chloride (K-DUR,KLOR-CON) 10 MEQ tablet Take 1 tablet (10 mEq total) by mouth daily. 90 tablet 3  . Vitamin D, Ergocalciferol, (DRISDOL) 50000 UNITS CAPS capsule Take 50,000 Units by mouth every 7 (seven) days. Saturday     No current facility-administered medications for this visit.     Allergies  Allergen Reactions  . Atorvastatin     Myalgias 10/2011  . Compazine [Prochlorperazine]     unknown  . Fenofibrate     Rash 09/2011  . Floxin [Ofloxacin]     unknown  . Livalo [Pitavastatin] Other (See Comments)    Leg pain  . Pravastatin Sodium     mylgias 09/2011  . Simvastatin     Elevated lft's 06/2011      Review of Systems:   General:  normal appetite, reduced energy, no weight gain, no weight loss, no fever  Cardiac:  no chest pain with exertion, no chest pain at rest, has SOB with moderate exertion, no resting SOB, no PND, no orthopnea, no palpitations, no arrhythmia, no atrial fibrillation, mile LE edema, no dizzy spells, no syncope  Respiratory:  exertional shortness of breath, no home oxygen, no productive cough, has dry cough, no bronchitis, no wheezing, no  hemoptysis, no asthma, no pain with inspiration or cough, no sleep apnea, no CPAP at night  GI:   no difficulty swallowing, no reflux, no frequent heartburn, no hiatal hernia, no abdominal pain, no constipation, no diarrhea, no hematochezia, no hematemesis, no melena  GU:   no dysuria,  no frequency, no urinary tract infection, no hematuria, no kidney stones, chronic kidney disease  Vascular:  no pain suggestive of claudication, no pain in feet, no leg cramps, no varicose veins, no DVT, no non-healing foot ulcer  Neuro:  no stroke, no TIA's, no seizures, no headaches, no temporary blindness one eye,  no slurred speech, no peripheral neuropathy, no chronic pain, mild instability of gait, no memory/cognitive dysfunction  Musculoskeletal: has arthritis, no joint swelling, no myalgias, no difficulty walking, normal mobility   Skin:   no rash, no itching, no skin infections, no pressure sores or ulcerations  Psych:   no anxiety, no depression, no nervousness, no unusual recent stress  Eyes:   no blurry vision, no floaters, no recent vision changes,  wears glasses   ENT:   no hearing loss, no loose or painful teeth, no dentures, last saw dentist this year and no problems found.  Hematologic:  no easy bruising, no abnormal bleeding, no clotting disorder, no frequent epistaxis  Endocrine:  no diabetes, does not check CBG's at home           Physical Exam:   BP 128/71   Pulse 63   Resp 20   Ht 5\' 3"  (1.6 m)   Wt 183 lb (83 kg)   LMP  (LMP Unknown)   SpO2 93% Comment: RA  BMI 32.42 kg/m   General:  Elderly but  well-appearing  HEENT:  Unremarkable, NCAT, PERLA, EOMI, oropharynx clear  Neck:   no JVD, no bruits, no adenopathy or thryromegaly  Chest:   clear to auscultation, symmetrical breath sounds, no wheezes, no rhonchi   CV:   RRR, grade III/VI crescendo/decrescendo murmur heard best at RSB,  no diastolic murmur  Abdomen:  soft, non-tender, no masses or organomegaly  Extremities:  warm,  well-perfused, pulses palpable in feet, no LE edema  Rectal/GU  Deferred  Neuro:   Grossly non-focal and symmetrical throughout  Skin:   Clean and dry, no rashes, no breakdown   Diagnostic Tests:    [facility]                        [address], [place] [address]                            [phone]  ------------------------------------------------------------------- Transthoracic Echocardiography  Patient:    Zuria, Fosdick MR #:       [id number] Study Date: 05/11/2016 Gender:     F Age:        52 Height:     160 cm Weight:     85.5 kg BSA:        1.98 m^2 Pt. Status: Room:   ORDERING     Fransico Him, MD  REFERRING    Fransico Him, MD  SONOGRAPHER  Cindy Hazy, RDCS  ATTENDING    Sanda Klein, MD  PERFORMING   [facility], Outpatient  cc:  ------------------------------------------------------------------- LV EF: 55% -   60%  ------------------------------------------------------------------- Indications:      I35.9 Aortic Valve Disorder.  ------------------------------------------------------------------- History:   PMH:  Acquired from the patient and from the patient&'s chart.  PMH:  History of Breast Cancer. CAD. SVT. CHF.  Risk factors:  Dyslipidemia.  ------------------------------------------------------------------- Study Conclusions  - Left ventricle: The cavity size was normal. Wall thickness was   normal. Systolic function was normal. The estimated ejection   fraction was in the range of 55% to 60%. Wall motion was normal;   there were no regional wall  motion abnormalities. - Aortic valve: There was moderate to severe stenosis. There was   mild regurgitation. - Mitral valve: There was mild regurgitation. - Left atrium: The atrium was mildly dilated. - Right atrium: The atrium was mildly dilated.  Impressions:  - Compared to 05/12/2015, aortic stenosis has progressed and there   is  evidence for decompensated diastolic dysfunction.  ------------------------------------------------------------------- Study data:   Study status:  Routine.  Procedure:  The patient reported no pain pre or post test. Transthoracic echocardiography for left ventricular function evaluation, for right ventricular function evaluation, and for assessment of valvular function. Image quality was adequate.  Study completion:  There were no complications.          Transthoracic echocardiography.  M-mode, complete 2D, spectral Doppler, and color Doppler.  Birthdate: Patient birthdate: 1931/09/01.  Age:  Patient is 83 yr old.  Sex: Gender: female.    BMI: 33.4 kg/m^2.  Patient status:  Outpatient. Study date:  Study date: 05/11/2016. Study time: 08:48 AM. Location:  [facility]  -------------------------------------------------------------------  ------------------------------------------------------------------- Left ventricle:  The cavity size was normal. Wall thickness was normal. Systolic function was normal. The estimated ejection fraction was in the range of 55% to 60%. Wall motion was normal; there were no regional wall motion abnormalities.  ------------------------------------------------------------------- Aortic valve:   Trileaflet; moderately thickened, moderately calcified leaflets.  Doppler:   There was moderate to severe stenosis.   There was mild regurgitation.    VTI ratio of LVOT to aortic valve: 0.29. Valve area (VTI): 1 cm^2. Indexed valve area (VTI): 0.5 cm^2/m^2. Peak velocity ratio of LVOT to aortic valve: 0.25. Valve area (Vmax): 0.86 cm^2. Indexed valve area (Vmax): 0.43 cm^2/m^2. Mean velocity ratio of LVOT to aortic valve: 0.26. Valve area (Vmean): 0.89 cm^2. Indexed valve area (Vmean): 0.45 cm^2/m^2.    Mean gradient (S): 37 mm Hg. Peak gradient (S): 69 mm Hg.  ------------------------------------------------------------------- Aorta:  Aortic root: The  aortic root was normal in size. Ascending aorta: The ascending aorta was normal in size.  ------------------------------------------------------------------- Mitral valve:   Mildly thickened leaflets .  Doppler:  There was mild regurgitation.    Peak gradient (D): 6 mm Hg.  ------------------------------------------------------------------- Left atrium:  The atrium was mildly dilated.  ------------------------------------------------------------------- Right ventricle:  The cavity size was normal. Systolic function was normal.  ------------------------------------------------------------------- Pulmonic valve:   Poorly visualized.  The valve appears to be grossly normal.   Cusp separation was normal.  Doppler: Transvalvular velocity was within the normal range. There was no regurgitation.  ------------------------------------------------------------------- Tricuspid valve:   Structurally normal valve.   Leaflet separation was normal.  Doppler:  Transvalvular velocity was within the normal range. There was no regurgitation.  ------------------------------------------------------------------- Pulmonary artery:    Systolic pressure could not be accurately estimated.  ------------------------------------------------------------------- Right atrium:  The atrium was mildly dilated.  ------------------------------------------------------------------- Pericardium:  There was no pericardial effusion.  ------------------------------------------------------------------- Systemic veins: Inferior vena cava: The vessel was normal in size. The respirophasic diameter changes were in the normal range (>= 50%), consistent with normal central venous pressure.  ------------------------------------------------------------------- Measurements   Left ventricle                            Value          Reference  LV ID, ED, PLAX chordal           (L)     42.1  mm  43 - 52  LV  ID, ES, PLAX chordal                   26.5  mm       23 - 38  LV fx shortening, PLAX chordal            37    %        >=29  LV PW thickness, ED                       11.6  mm       ---------  IVS/LV PW ratio, ED                       1.25           <=1.3  Stroke volume, 2D                         99    ml       ---------  Stroke volume/bsa, 2D                     50    ml/m^2   ---------  LV e&', lateral                            10.1  cm/s     ---------  LV E/e&', lateral                          12.28          ---------  LV e&', medial                             4.83  cm/s     ---------  LV E/e&', medial                           25.67          ---------  LV e&', average                            7.47  cm/s     ---------  LV E/e&', average                          16.61          ---------    Ventricular septum                        Value          Reference  IVS thickness, ED                         14.5  mm       ---------    LVOT                                      Value          Reference  LVOT ID, S  21    mm       ---------  LVOT area                                 3.46  cm^2     ---------  LVOT ID                                   21    mm       ---------  LVOT peak velocity, S                     103   cm/s     ---------  LVOT mean velocity, S                     75.4  cm/s     ---------  LVOT VTI, S                               28.6  cm       ---------  LVOT peak gradient, S                     4     mm Hg    ---------  Stroke volume (SV), LVOT DP               99.1  ml       ---------  Stroke index (SV/bsa), LVOT DP            49.9  ml/m^2   ---------    Aortic valve                              Value          Reference  Aortic valve peak velocity, S             416   cm/s     ---------  Aortic valve mean velocity, S             292   cm/s     ---------  Aortic valve VTI, S                       98.5  cm       ---------  Aortic mean  gradient, S                   37    mm Hg    ---------  Aortic peak gradient, S                   69    mm Hg    ---------  VTI ratio, LVOT/AV                        0.29           ---------  Aortic valve area, VTI                    1     cm^2     ---------  Aortic valve area/bsa, VTI  0.5   cm^2/m^2 ---------  Velocity ratio, peak, LVOT/AV             0.25           ---------  Aortic valve area, peak velocity          0.86  cm^2     ---------  Aortic valve area/bsa, peak               0.43  cm^2/m^2 ---------  velocity  Velocity ratio, mean, LVOT/AV             0.26           ---------  Aortic valve area, mean velocity          0.89  cm^2     ---------  Aortic valve area/bsa, mean               0.45  cm^2/m^2 ---------  velocity  Aortic regurg pressure half-time          400   ms       ---------    Aorta                                     Value          Reference  Aortic root ID, ED                        28    mm       ---------    Left atrium                               Value          Reference  LA ID, A-P, ES                            45    mm       ---------  LA ID/bsa, A-P                    (H)     2.27  cm/m^2   <=2.2  LA volume, S                              60    ml       ---------  LA volume/bsa, S                          30.2  ml/m^2   ---------  LA volume, ES, 1-p A4C                    48    ml       ---------  LA volume/bsa, ES, 1-p A4C                24.2  ml/m^2   ---------  LA volume, ES, 1-p A2C                    71    ml       ---------  LA volume/bsa, ES, 1-p A2C  35.8  ml/m^2   ---------    Mitral valve                              Value          Reference  Mitral E-wave peak velocity               124   cm/s     ---------  Mitral A-wave peak velocity               111   cm/s     ---------  Mitral deceleration time          (H)     370   ms       150 - 230  Mitral peak gradient, D                   6     mm Hg    ---------   Mitral E/A ratio, peak                    1.1            ---------    Right ventricle                           Value          Reference  RV s&', lateral, S                         14.3  cm/s     ---------  Legend: (L)  and  (H)  mark values outside specified reference range.  ------------------------------------------------------------------- Prepared and Electronically Authenticated by  Sanda Klein, MD 2018-05-08T15:24:59  Physicians   Panel Physicians Referring Physician Case Authorizing Physician  [name], MD (Primary)    Procedures   Right/Left Heart Cath and Coronary Angiography  Conclusion    Mid LAD lesion, 20% stenosed.   1. Mild non-obstructive CAD 2. Moderate aortic stenosis (peak to peak gradient 20 mmHg, AVA 1.1 cm2)  Recommendations: Medical management of CAD. Follow aortic stenosis with serial echocardiograms.    Indications   Aortic stenosis [I35.0 (ICD-10-CM)]  Abnormal nuclear stress test [R94.39 ([id number])]  Complications   Complications documented in old activity   Estimated blood loss <50 mL. Indication: 83 yo female with known moderate AS with recent weakness and dyspnea. Nuclear stress test with possible ischemia.   Procedure: The risks, benefits, complications, treatment options, and expected outcomes were discussed with the patient. The patient and/or family concurred with the proposed plan, giving informed consent. The patient was brought to the cath lab after IV hydration was begun and oral premedication was given. The patient was further sedated with Versed and Fentanyl. There was an IV catheter present in the left antecubital vein. This area was prepped and draped in a sterile fashion. I then changed out this IV catheter for a 5 French sheath. A balloon tipped catheter was used to perform a right heart catheterization. The left wrist was assessed with a modified Allens test which was positive. The left wrist was prepped  and draped in a sterile fashion. 1% lidocaine was used for local anesthesia. Using the modified Seldinger access technique, a 5 French sheath was placed in the left radial artery. 3 mg Verapamil was given through the sheath. 4500 units IV heparin was  given. Standard diagnostic catheters were used to perform selective coronary angiography. I crossed the aortic valve with an AL-1 catheter and a straight wire. The sheath was removed from the right radial artery and a Terumo hemostasis band was applied at the arteriotomy site on the right wrist.   Sedation: During this procedure the patient is administered a total of Versed 1 mg and Fentanyl 25 mcg to achieve and maintain moderate conscious sedation. The patient's heart rate, blood pressure, and oxygen saturation are monitored continuously during the procedure. The period of conscious sedation is 49 minutes, of which I was present face-to-face 100% of this time.      Coronary Findings   Dominance: Right  Left Anterior Descending  . Vessel is large.  Mid LAD lesion, 20% stenosed. Discrete.  Second Diagonal Branch  The vessel is moderate in size.  Third Diagonal Branch  The vessel is small in size.  Left Circumflex  . Vessel is moderate in size.  First Obtuse Marginal Branch  The vessel is small in size.  Second Obtuse Marginal Branch  The vessel is small in size.  Third Obtuse Marginal Branch  The vessel is moderate in size.  Right Coronary Artery  . Vessel is large. Vessel is angiographically normal.  Right Posterior Descending Artery  The vessel is moderate in size.  Left Heart   Aortic Valve There is moderate aortic valve stenosis.    Coronary Diagrams   Diagnostic Diagram       Implants     No implant documentation for this case.  PACS Images   Show images for Cardiac catheterization   Link to Procedure Log   Procedure Log    Hemo Data    Most Recent Value  Fick Cardiac Output 4.75 L/min  Fick Cardiac Output Index  2.5 (L/min)/BSA  Aortic Mean Gradient 23.9 mmHg  Aortic Peak Gradient 20 mmHg  Aortic Valve Area 1.10  Aortic Value Area Index 0.58 cm2/BSA  RA A Wave 10 mmHg  RA V Wave 9 mmHg  RA Mean 7 mmHg  RV Systolic Pressure 38 mmHg  RV Diastolic Pressure 5 mmHg  RV EDP 9 mmHg  PA Systolic Pressure 42 mmHg  PA Diastolic Pressure 6 mmHg  PA Mean 21 mmHg  PW A Wave 16 mmHg  PW V Wave 15 mmHg  PW Mean 11 mmHg  AO Systolic Pressure 812 mmHg  AO Diastolic Pressure 60 mmHg  AO Mean 84 mmHg  LV Systolic Pressure 751 mmHg  LV Diastolic Pressure 8 mmHg  LV EDP 16 mmHg  Arterial Occlusion Pressure Extended Systolic Pressure 700 mmHg  Arterial Occlusion Pressure Extended Diastolic Pressure 61 mmHg  Arterial Occlusion Pressure Extended Mean Pressure 91 mmHg  Left Ventricular Apex Extended Systolic Pressure 174 mmHg  Left Ventricular Apex Extended Diastolic Pressure 8 mmHg  Left Ventricular Apex Extended EDP Pressure 18 mmHg  QP/QS 1  TPVR Index 8.41 HRUI  TSVR Index 33.64 HRUI  PVR SVR Ratio 0.13  TPVR/TSVR Ratio 0.25    CT CORONARY MORPH W/CTA COR W/SCORE W/CA W/CM &/OR WO/CM (Accession [id number]) (Order [id number])  Imaging  Date: 06/30/2016 Department: [facility] CT IMAGING Released By: Reggy Eye Authorizing: Sherren Mocha, MD  Exam Information   Status Exam Begun  Exam Ended   Final [99] 06/30/2016 10:31 AM 06/30/2016 11:17 AM  PACS Images   Show images for CT CORONARY MORPH W/CTA COR W/SCORE W/CA W/CM &/OR WO/CM  Addendum   ADDENDUM REPORT: 06/30/2016 17:25  CLINICAL DATA:  83 year old female with severe aortic stenosis.  EXAM: Cardiac TAVR CT  TECHNIQUE: The patient was scanned on a Philips 256 scanner. A 120 kV retrospective scan was triggered in the descending thoracic aorta at 111 HU's. Gantry rotation speed was 270 msecs and collimation was .9 mm. 5 mg of IV Metoprolol and no nitro were given. The 3D data set was reconstructed in 5%  intervals of the R-R cycle. Systolic and diastolic phases were analyzed on a dedicated work station using MPR, MIP and VRT modes. The patient received 80 cc of contrast.  FINDINGS: Aortic Valve: Trileaflet, severely thickened and calcified aortic valve with severely restricted leaflet opening. There are asymmetric calcifications predominantly in the non-coronary cusp extending into the LVOT.  Aorta:  Normal size, no dissection.  Mild diffuse calcifications.  Sinotubular Junction:  27 x 25 mm  Ascending Thoracic Aorta:  31 x 30 mm  Aortic Arch:  26 x 26 mm  Descending Thoracic Aorta:  23 x 22 mm  Sinus of Valsalva Measurements:  Non-coronary:  30 mm  Right -coronary:  29 mm  Left -coronary:  30 mm  Coronary Artery Height above Annulus:  Left Main:  14 mm  Right Coronary:  16 mm  Virtual Basal Annulus Measurements:  Maximum/Minimum Diameter:  27 x 21 mm  Perimeter:  77 mm  Area:  447 mm2  Optimum Fluoroscopic Angle for Delivery:  LAO 6 CAU 6  IMPRESSION: 1. Trileaflet, severely thickened and calcified aortic valve with severely restricted leaflet opening. There are asymmetric calcifications predominantly in the non-coronary cusp extending into the LVOT. Annular measurements suitable for delivery of a 26 mm Edwards-SAPIEN 3 valve.  2. Sufficient annulus to coronary distance.  3. Optimum Fluoroscopic Angle for Delivery:  LAO 6 CAU 6  4. No thrombus in the left atrial appendage.  Ena Dawley   Electronically Signed   By: Ena Dawley   On: 06/30/2016 17:25   Addended by Dorothy Spark, MD on 06/30/2016 5:27 PM    Study Result   EXAM: OVER-READ INTERPRETATION  CT CHEST  The following report is an over-read performed by radiologist Dr. Collene Leyden [facility], [place] on 06/30/2016. This over-read does not include interpretation of cardiac or coronary anatomy or pathology. The coronary CTA interpretation by  the cardiologist is attached.  COMPARISON:  None.  FINDINGS: Cardiovascular: Heart is upper limits normal in size. Aorta is normal caliber.  Mediastinum/Nodes: No mediastinal, hilar, or axillary adenopathy. Trachea and esophagus are unremarkable.  Lungs/Pleura: No confluent airspace opacities or effusions.  Upper Abdomen: Imaging into the upper abdomen shows no acute findings.  Musculoskeletal: Chest wall soft tissues are unremarkable. No acute bony abnormality.  IMPRESSION: No acute or significant extracardiac abnormality.  Electronically Signed: By: Rolm Baptise M.D. On: 06/30/2016 11:25       CT Angio Abd/Pel w/ and/or w/o (Accession [id number]) (Order [id number])  Imaging  Date: 06/30/2016 Department: [facility] IMAGING Released By: Theodore Demark, RT Authorizing: Sherren Mocha, MD  Exam Information   Status Exam Begun  Exam Ended   Final [[id number]] 06/30/2016 10:35 AM 06/30/2016 11:18 AM  PACS Images   Show images for CT Angio Abd/Pel w/ and/or w/o  Study Result   CLINICAL DATA:  83 year old female with history of severe aortic stenosis. Preprocedural study prior to potential transcatheter aortic valve replacement (TAVR) procedure.  EXAM: CT ANGIOGRAPHY CHEST, ABDOMEN AND PELVIS  TECHNIQUE: Multidetector CT imaging through the chest, abdomen and pelvis was  performed using the standard protocol during bolus administration of intravenous contrast. Multiplanar reconstructed images and MIPs were obtained and reviewed to evaluate the vascular anatomy.  CONTRAST:  150 mL of Isovue 370.  COMPARISON:  CT the abdomen and pelvis 06/09/2004. Chest CT 03/17/2005.  FINDINGS: CTA CHEST FINDINGS  Cardiovascular: Heart size is mildly enlarged with left atrial dilatation. There is no significant pericardial fluid, thickening or pericardial calcification. There is aortic atherosclerosis, as well as atherosclerosis of the great vessels  of the mediastinum and the coronary arteries, including calcified atherosclerotic plaque in the left main and left anterior descending coronary arteries. Calcifications of the aortic valve. Mild calcifications of the mitral annulus.  Mediastinum/Lymph Nodes: No pathologically enlarged mediastinal or hilar lymph nodes. Esophagus is unremarkable in appearance. No axillary lymphadenopathy.  Lungs/Pleura: 8 x 4 mm nodule in the right middle lobe associated with the minor fissure (axial image 75 of series 7), stable compared to remote prior study from 2007, considered definitively benign (presumably a subpleural lymph node). No acute consolidative airspace disease. No pleural effusions.  Musculoskeletal/Soft Tissues: There are no aggressive appearing lytic or blastic lesions noted in the visualized portions of the skeleton.  CTA ABDOMEN AND PELVIS FINDINGS  Hepatobiliary: No cystic or solid hepatic lesions. No intra or extrahepatic biliary ductal dilatation. Status post cholecystectomy.  Pancreas: No pancreatic mass. No pancreatic ductal dilatation. No pancreatic or peripancreatic fluid or inflammatory changes.  Spleen: Unremarkable.  Adrenals/Urinary Tract: Bilateral kidneys and bilateral adrenal glands are normal in appearance. No hydroureteronephrosis. Urinary bladder is largely obscured by extensive beam hardening artifact from the patient's bilateral hip arthroplasties.  Stomach/Bowel: The appearance of the stomach is normal. There is no pathologic dilatation of small bowel or colon. The appendix is not confidently identified and may be surgically absent. Regardless, there are no inflammatory changes noted adjacent to the cecum to suggest the presence of an acute appendicitis at this time.  Vascular/Lymphatic: Aortic atherosclerosis with vascular findings and measurements pertinent to potential TAVR procedure, as detailed below. Portions of the common femoral  arteries are obscured by beam hardening artifact, as discussed below, and are considered uninterpretable. No aneurysm or dissection noted in the abdominal or pelvic vasculature. Celiac axis, superior mesenteric artery and inferior mesenteric artery all appear patent without hemodynamically significant stenosis. Single renal arteries bilaterally are patent without hemodynamically significant stenosis. No lymphadenopathy noted in the abdomen or pelvis.  Reproductive: 3.3 x 2.8 x 2.0 cm mass in the fundus of the uterus, nonspecific, but likely to represent a fibroid. Ovaries are atrophic.  Other: Subtle fat containing mass in the retroperitoneum immediately adjacent to the inferior aspect of the pancreatic head and uncinate process measuring approximately 3.4 x 2.4 x 3.7 cm (axial image 161 of series 6 and coronal image 69 of series 9), increased in size compared to remote prior examination from 06/09/2004 at which point this lesion measured 2.1 x 1.6 cm on axial images. No significant volume of ascites. No pneumoperitoneum.  Musculoskeletal: Status post bilateral total hip arthroplasty. There are no aggressive appearing lytic or blastic lesions noted in the visualized portions of the skeleton.  VASCULAR MEASUREMENTS PERTINENT TO TAVR:  AORTA:  Minimal Aortic Diameter -  15 x 15 mm  Severity of Aortic Calcification -  moderate  RIGHT PELVIS:  Right Common Iliac Artery -  Minimal Diameter - 9.3 x 8.3 mm  Tortuosity - mild  Calcification - mild  Right External Iliac Artery -  Minimal Diameter - 6.4 x 6.1 mm  Tortuosity - mild  Calcification - none  Right Common Femoral Artery -  Minimal Diameter - uninterpretable secondary to artifact  Tortuosity - mild  Calcification - mild  LEFT PELVIS:  Left Common Iliac Artery -  Minimal Diameter - 10.1 x 8.4 mm  Tortuosity - mild  Calcification - mild  Left External Iliac Artery  -  Minimal Diameter - 6.5 x 6.2 mm  Tortuosity - mild  Calcification - none  Left Common Femoral Artery -  Minimal Diameter - uninterpretable secondary to artifact.  Tortuosity - mild  Calcification - mild  Review of the MIP images confirms the above findings.  IMPRESSION: 1. Vascular findings and measurements pertinent to potential TAVR procedure, as detailed above. This patient appears likely to have suitable pelvic arterial access bilaterally, however, secondary to extensive beam hardening artifact from the patient's bilateral total hip arthroplasties, portions of the common femoral arteries are completely obscured and cannot be accurately evaluated on today's examination. However, given the minimal tortuosity in the pelvic vasculature and general mild degree of atherosclerosis throughout the remaining vasculature, it is highly likely that these common femoral arteries are suitable for access. This could be confirmed with catheter arteriography if clinically appropriate. 2. Calcifications of the aortic valve, compatible with the reported clinical history of severe aortic stenosis. 3. Fatty attenuation mass in the retroperitoneum immediately inferior to the pancreatic head and uncinate process measuring 3.4 x 2.4 x 3.7 cm which has slowly grown compared to remote prior examination from 2006. 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 A slow-growing low-grade retroperitoneal liposarcoma is not favored at this time, but is not entirely excluded, and attention on repeat CT of the abdomen and pelvis is suggested in 12 months to ensure stability. 4. Cardiomegaly with left atrial dilatation. 5. Additional incidental findings, as above.   Electronically Signed   By: Vinnie Langton M.D.   On: 06/30/2016 14:10      Ref Range & Units 3wk ago  FVC-Pre L 1.29   FVC-%Pred-Pre % 60   FVC-Post L 1.14   FVC-%Pred-Post % 53    FVC-%Change-Post % -11   FEV1-Pre L 0.79   FEV1-%Pred-Pre % 50   FEV1-Post L 0.77   FEV1-%Pred-Post % 48   FEV1-%Change-Post % -3   FEV6-Pre L 1.25   FEV6-%Pred-Pre % 62   FEV6-Post L 1.14   FEV6-%Pred-Post % 56   FEV6-%Change-Post % -8   Pre FEV1/FVC ratio % 61   FEV1FVC-%Pred-Pre % 84   Post FEV1/FVC ratio % 67   FEV1FVC-%Change-Post % 8   Pre FEV6/FVC Ratio % 97   FEV6FVC-%Pred-Pre % 102   Post FEV6/FVC ratio % 99   FEV6FVC-%Pred-Post % 105   FEV6FVC-%Change-Post % 2   FEF 25-75 Pre L/sec 0.34   FEF2575-%Pred-Pre % 31   FEF 25-75 Post L/sec 0.30   FEF2575-%Pred-Post % 27   FEF2575-%Change-Post % -12   RV L 2.67   RV % pred % 116   TLC L 4.18   TLC % pred % 90       STS RISK CALCULATOR: Procedure: AV Replacement  Risk of Mortality: 2.603%  Morbidity or Mortality: 14.554%  Long Length of Stay: 6.916%  Short Length of Stay: 30.416%  Permanent Stroke: 1.551%  Prolonged Ventilation: 9.594%  DSW Infection: 0.254%  Renal Failure: 2.846%  Reoperation: 6.462%   Impression:  This 83 year old woman has stage D, severe, symptomatic aortic stenosis with NYHA class II symptoms of exertional fatigue and shortness  of breath consistent with chronic diastolic heart failure. I have personally reviewed her echo, cath and CTA studies. She has a trileaflet, moderately calcified and thickened valve with restricted mobility and a mean gradient of 37 mm Hg with a dimensionless index of 0.25, indexed valve area of 0.45 cm2/m2. LV function is normal and there was minimal non-obstructive disease on cath 1 year ago. Her recent ETT was negative for ischemia and I don't think she needs another cath. I think AVR is indicated in this patient for relief of her heart failure symptoms and to prevent LV deterioration. 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 I think TAVR is the best treatment for her with lower operative risk and a much quicker recovery.  Her gated cardiac CT shows anatomy favorable for a 26 mm Sapien 3 valve and her pelvic arterial anatomy is suitable for transfemoral insertion.  The patient and her daughter were counseled at length regarding treatment alternatives for management of severe symptomatic aortic stenosis. The risks and benefits of surgical intervention has been discussed in detail. Long-term prognosis with medical therapy was discussed. Alternative approaches such as conventional surgical aortic valve replacement, transcatheter aortic valve replacement, and palliative medical therapy were compared and contrasted at length. This discussion was placed in the context of the patient's own specific clinical presentation and past medical history. All of their questions been addressed. The patient is eager to proceed with surgical management as soon as possible.   Following the decision to proceed with transcatheter aortic valve replacement, a discussion was held regarding what types of management strategies would be attempted intraoperatively in the event of life-threatening complications, including whether or not the patient would be considered a candidate for the use of cardiopulmonary bypass and/or conversion to open sternotomy for attempted surgical intervention.   The patient has been advised of a variety of complications that might develop including but not limited to risks of death, stroke, paravalvular leak, aortic dissection or other major vascular complications, aortic annulus rupture, device embolization, cardiac rupture or perforation, mitral regurgitation, acute myocardial infarction, arrhythmia, heart block or bradycardia requiring permanent pacemaker placement, congestive heart failure, respiratory failure, renal failure, pneumonia, infection, other late complications related to structural valve  deterioration or migration, or other complications that might ultimately cause a temporary or permanent loss of functional independence or other long term morbidity. The patient provides full informed consent for the procedure as described and all questions were answered.     Plan:  Transfemoral TAVR on 07/27/2016   I spent 60 minutes performing this consultation and > 50% of this time was spent face to face counseling and coordinating the care of this patient's severe aortic stenosis.    Gaye Pollack, MD 07/06/2016

## 2016-07-14 ENCOUNTER — Telehealth: Payer: Self-pay | Admitting: Pharmacist

## 2016-07-14 NOTE — Telephone Encounter (Signed)
Called to follow up with patient about lipids/if patient has decided about cholesterol treatment as previously seen in lipid clinic. She states she would like to wait until after her procedure with Dr. Cyndia Bent to pursue treatment. She is aware to call so that we can start treatment or if she has any questions. She states appreciation for follow up call.

## 2016-07-15 ENCOUNTER — Encounter: Payer: Self-pay | Admitting: Thoracic Surgery (Cardiothoracic Vascular Surgery)

## 2016-07-15 ENCOUNTER — Institutional Professional Consult (permissible substitution) (INDEPENDENT_AMBULATORY_CARE_PROVIDER_SITE_OTHER): Payer: Medicare Other | Admitting: Thoracic Surgery (Cardiothoracic Vascular Surgery)

## 2016-07-15 VITALS — BP 145/77 | HR 55 | Resp 20 | Ht 63.0 in | Wt 183.0 lb

## 2016-07-15 DIAGNOSIS — I350 Nonrheumatic aortic (valve) stenosis: Secondary | ICD-10-CM | POA: Diagnosis not present

## 2016-07-15 DIAGNOSIS — I2510 Atherosclerotic heart disease of native coronary artery without angina pectoris: Secondary | ICD-10-CM | POA: Diagnosis not present

## 2016-07-15 NOTE — Patient Instructions (Signed)
   Continue taking all current medications without change through the day before surgery.  Have nothing to eat or drink after midnight the night before surgery.  On the morning of surgery take only Synthroid and Prevacid with a sip of water.

## 2016-07-15 NOTE — Progress Notes (Signed)
[facility]  MULTIDISCIPLINARY HEART VALVE CLINIC  CARDIOTHORACIC SURGERY CONSULTATION REPORT  Referring Provider is Janice Margarita, MD PCP is Janice Arabian, MD  Chief Complaint  Patient presents with  . Aortic Stenosis    2nd TAVR eval, review all studies, surgery scheduled 07/27/16    HPI:  Patient is an 83 year old female with history of rheumatic fever during childhood, aortic stenosis, chronic kidney disease, hypercholesterolemia, hypothyroidism, breast cancer status post lumpectomy followed by chemotherapy and radiation therapy, degenerative arthritis status post bilateral total hip replacement and right total knee replacement with been referred for second surgical opinion to discuss treatment options for management of severe symptomatic aortic stenosis. The patient has been told of the presence of a heart murmur for many years. She has been followed for the last several years by Dr. Radford George. Echocardiograms have demonstrated the presence of aortic stenosis that has gradually progressed. In 2017 the patient began to experience symptoms of exertional shortness of breath and fatigue. She underwent nuclear stress test at that time that was felt to be moderate risk for ischemia. Diagnostic cardiac catheterization revealed mild nonobstructive coronary artery disease. Echocardiogram revealed normal left ventricular systolic function and moderate aortic stenosis. Over the past year symptoms of exertional shortness of breath that progressed. Recent follow-up echocardiogram performed 05/11/2016 revealed severe aortic stenosis with peak velocity across the aortic valve measured greater than 4.1 m/s corresponding to mean transvalvular gradient estimated 37 mmHg. Exercise treadmill test revealed no ischemia but moderately impaired exercise capacity. The patient was referred to Dr. Burt George for consultation and subsequently underwent CT angiography to evaluate the feasibility of transcatheter  aortic valve replacement. She was referred for surgical consultation and evaluated previously by Dr. Cyndia George on 07/06/2016. Plans for transcatheter aortic valve replacement have been made in the patient has been referred for second surgical opinion.  The patient is widowed and lives independently in [place]. Her daughter is [name], a nurse who worked in the cardiac surgery operating room for many years in the past. The patient remains reasonably active physically and entirely functionally independent. She complains of progressive symptoms of exertional shortness of breath and fatigue. She now gets short of breath with moderate activity such as walking up a single flight of stairs. She denies any history of resting shortness of breath, PND, orthopnea, or lower extremity edema. She denies any history of exertional chest pain or chest tightness. She has had occasional palpitations but she denies any dizzy spells or syncope.  Past Medical History:  Diagnosis Date  . Aortic stenosis    moderate by cath 07/2015  . Atypical chest pain   . Breast cancer, right breast ([place])   . CAD (coronary artery disease), native coronary artery 10/20/2015   20% mid LAD  . Cancer ([place]) 2006   SURGERY AND CHEMO AND RADIATION   . CKD (chronic kidney disease), stage III   . Diastolic dysfunction   . Hypercholesteremia   . Hypothyroidism   . Osteopenia   . PUD (peptic ulcer disease)   . Rheumatic fever   . SVT (supraventricular tachycardia) [facility]) October 2014   converted with vagal maneuver  . Vitamin D deficiency     Past Surgical History:  Procedure Laterality Date  . BREAST SURGERY     RIGHT LUMPECTOMY AND REMOVAL OF LYMPH NODES  . CARDIAC CATHETERIZATION N/A 07/18/2015   Procedure: Right/Left Heart Cath and Coronary Angiography;  Surgeon: [name], MD;  Location: [facility];  Service: Cardiovascular;  Laterality: N/A;  . CHOLECYSTECTOMY N/A 08/06/2014   Procedure: LAPAROSCOPIC  CHOLECYSTECTOMY ;  Surgeon: Rolm Bookbinder, MD;  Location: [facility];  Service: General;  Laterality: N/A;  . REPLACEMENT TOTAL KNEE Right 2009  . THYROID SURGERY     NODULE REMOVED  . TOTAL HIP ARTHROPLASTY Left 2003  . TOTAL HIP ARTHROPLASTY Right 08/28/2013   Procedure: RIGHT TOTAL HIP ARTHROPLASTY ANTERIOR APPROACH;  Surgeon: Mauri Pole, MD;  Location: [facility];  Service: Orthopedics;  Laterality: Right;    Family History  Problem Relation Age of Onset  . Heart disease Mother   . Heart disease Father     Social History   Social History  . Marital status: Widowed    Spouse name: N/A  . Number of children: N/A  . Years of education: N/A   Occupational History  . Not on file.   Social History Main Topics  . Smoking status: Never Smoker  . Smokeless tobacco: Never Used  . Alcohol use No  . Drug use: No  . Sexual activity: Not on file   Other Topics Concern  . Not on file   Social History Narrative  . No narrative on file    Current Outpatient Prescriptions  Medication Sig Dispense Refill  . aspirin EC 81 MG tablet Take 81 mg by mouth daily.    . Calcium Carbonate-Vitamin D (CALCIUM + D PO) Take 1 tablet by mouth daily.    . chlorpheniramine (CHLOR-TRIMETON) 4 MG tablet Take 4 mg by mouth daily.    . Coenzyme Q10 (COQ-10 PO) Take 1 tablet by mouth daily.     . Cyanocobalamin (VITAMIN B-12 PO) Take 1 tablet by mouth daily.    . fish oil-omega-3 fatty acids 1000 MG capsule Take 1 g by mouth daily.    . furosemide (LASIX) 20 MG tablet Take 1 tablet (20 mg total) by mouth every evening. 90 tablet 3  . lansoprazole (PREVACID) 30 MG capsule Take 30 mg by mouth daily.    Marland Kitchen levothyroxine (SYNTHROID, LEVOTHROID) 112 MCG tablet Take 112 mcg by mouth daily before breakfast.    . magnesium gluconate (MAGONATE) 500 MG tablet Take 500 mg by mouth 2 (two) times daily.     . Multiple Vitamin (MULTIVITAMIN WITH MINERALS) TABS tablet Take 1 tablet by mouth daily.    . potassium  chloride (K-DUR,KLOR-CON) 10 MEQ tablet Take 1 tablet (10 mEq total) by mouth daily. 90 tablet 3  . Vitamin D, Ergocalciferol, (DRISDOL) 50000 UNITS CAPS capsule Take 50,000 Units by mouth every 7 (seven) days. Saturday     No current facility-administered medications for this visit.     Allergies  Allergen Reactions  . Atorvastatin     Myalgias 10/2011  . Compazine [Prochlorperazine]     unknown  . Fenofibrate     Rash 09/2011  . Floxin [Ofloxacin]     unknown  . Livalo [Pitavastatin] Other (See Comments)    Leg pain  . Pravastatin Sodium     mylgias 09/2011  . Simvastatin     Elevated lft's 06/2011      Review of Systems:   General:  normal appetite, decreased energy, no weight gain, no weight loss, no fever  Cardiac:  no chest pain with exertion, no chest pain at rest, + SOB with exertion, no resting SOB, no PND, no orthopnea, + palpitations, no arrhythmia, no atrial fibrillation, no LE edema, no dizzy spells, no syncope  Respiratory:  + shortness of breath, no home oxygen,  no productive cough, + CHRONIC dry cough, no bronchitis, no wheezing, no hemoptysis, no asthma, no pain with inspiration or cough, no sleep apnea, no CPAP at night  GI:   no difficulty swallowing, no reflux, no frequent heartburn, no hiatal hernia, no abdominal pain, no constipation, no diarrhea, no hematochezia, no hematemesis, no melena  GU:   no dysuria,  no frequency, no urinary tract infection, no hematuria, no enlarged prostate, no kidney stones, + kidney disease  Vascular:  no pain suggestive of claudication, no pain in feet, no leg cramps, no varicose veins, no DVT, no non-healing foot ulcer  Neuro:   no stroke, no TIA's, no seizures, no headaches, no temporary blindness one eye,  no slurred speech, no peripheral neuropathy, no chronic pain, no instability of gait, no memory/cognitive dysfunction  Musculoskeletal: + arthritis, no joint swelling, no myalgias, no difficulty walking, normal mobility    Skin:   no rash, no itching, no skin infections, no pressure sores or ulcerations  Psych:   no anxiety, no depression, no nervousness, no unusual recent stress  Eyes:   no blurry vision, no floaters, no recent vision changes, + wears glasses for reading  ENT:   no hearing loss, no loose or painful teeth, no dentures, last saw dentist 3 months ago  Hematologic:  no easy bruising, no abnormal bleeding, no clotting disorder, no frequent epistaxis  Endocrine:  no diabetes, does not check CBG's at home           Physical Exam:   BP (!) 145/77   Pulse (!) 55   Resp 20 Comment: RA  Ht 5\' 3"  (1.6 m)   Wt 183 lb (83 kg)   LMP  (LMP Unknown)   SpO2 97%   BMI 32.42 kg/m   General:  Moderately obese,  well-appearing  HEENT:  Unremarkable   Neck:   no JVD, no bruits, no adenopathy   Chest:   clear to auscultation, symmetrical breath sounds, no wheezes, no rhonchi   CV:   RRR, grade III/VI crescendo/decrescendo murmur heard best at RSB,  no diastolic murmur  Abdomen:  soft, non-tender, no masses   Extremities:  warm, well-perfused, pulses diminished, no LE edema  Rectal/GU  Deferred  Neuro:   Grossly non-focal and symmetrical throughout  Skin:   Clean and dry, no rashes, no breakdown   Diagnostic Tests:  Transthoracic Echocardiography  Patient:    [name], [name] MR #:       [id number] Study Date: 05/11/2016 Gender:     F Age:        4 Height:     160 cm Weight:     85.5 kg BSA:        1.98 m^2 Pt. Status: Room:   ORDERING     Fransico Him, MD  REFERRING    Fransico Him, MD  SONOGRAPHER  Cindy Hazy, RDCS  ATTENDING    Sanda Klein, MD  PERFORMING   [facility], Outpatient  cc:  ------------------------------------------------------------------- LV EF: 55% -   60%  ------------------------------------------------------------------- Indications:      I35.9 Aortic Valve Disorder.  ------------------------------------------------------------------- History:    PMH:  Acquired from the patient and from the patient&'s chart.  PMH:  History of Breast Cancer. CAD. SVT. CHF.  Risk factors:  Dyslipidemia.  ------------------------------------------------------------------- Study Conclusions  - Left ventricle: The cavity size was normal. Wall thickness was   normal. Systolic function was normal. The estimated ejection   fraction was in the range of 55% to  60%. Wall motion was normal;   there were no regional wall motion abnormalities. - Aortic valve: There was moderate to severe stenosis. There was   mild regurgitation. - Mitral valve: There was mild regurgitation. - Left atrium: The atrium was mildly dilated. - Right atrium: The atrium was mildly dilated.  Impressions:  - Compared to 05/12/2015, aortic stenosis has progressed and there   is evidence for decompensated diastolic dysfunction.  ------------------------------------------------------------------- Study data:   Study status:  Routine.  Procedure:  The patient reported no pain pre or post test. Transthoracic echocardiography for left ventricular function evaluation, for right ventricular function evaluation, and for assessment of valvular function. Image quality was adequate.  Study completion:  There were no complications.          Transthoracic echocardiography.  M-mode, complete 2D, spectral Doppler, and color Doppler.  Birthdate: Patient birthdate: [birth year].  Age:  Patient is 83 yr old.  Sex: Gender: female.    BMI: 33.4 kg/m^2.  Patient status:  Outpatient. Study date:  Study date: 05/11/2016. Study time: 08:48 AM. Location:  [facility]  -------------------------------------------------------------------  ------------------------------------------------------------------- Left ventricle:  The cavity size was normal. Wall thickness was normal. Systolic function was normal. The estimated ejection fraction was in the range of 55% to 60%. Wall motion was  normal; there were no regional wall motion abnormalities.  ------------------------------------------------------------------- Aortic valve:   Trileaflet; moderately thickened, moderately calcified leaflets.  Doppler:   There was moderate to severe stenosis.   There was mild regurgitation.    VTI ratio of LVOT to aortic valve: 0.29. Valve area (VTI): 1 cm^2. Indexed valve area (VTI): 0.5 cm^2/m^2. Peak velocity ratio of LVOT to aortic valve: 0.25. Valve area (Vmax): 0.86 cm^2. Indexed valve area (Vmax): 0.43 cm^2/m^2. Mean velocity ratio of LVOT to aortic valve: 0.26. Valve area (Vmean): 0.89 cm^2. Indexed valve area (Vmean): 0.45 cm^2/m^2.    Mean gradient (S): 37 mm Hg. Peak gradient (S): 69 mm Hg.  ------------------------------------------------------------------- Aorta:  Aortic root: The aortic root was normal in size. Ascending aorta: The ascending aorta was normal in size.  ------------------------------------------------------------------- Mitral valve:   Mildly thickened leaflets .  Doppler:  There was mild regurgitation.    Peak gradient (D): 6 mm Hg.  ------------------------------------------------------------------- Left atrium:  The atrium was mildly dilated.  ------------------------------------------------------------------- Right ventricle:  The cavity size was normal. Systolic function was normal.  ------------------------------------------------------------------- Pulmonic valve:   Poorly visualized.  The valve appears to be grossly normal.   Cusp separation was normal.  Doppler: Transvalvular velocity was within the normal range. There was no regurgitation.  ------------------------------------------------------------------- Tricuspid valve:   Structurally normal valve.   Leaflet separation was normal.  Doppler:  Transvalvular velocity was within the normal range. There was no  regurgitation.  ------------------------------------------------------------------- Pulmonary artery:    Systolic pressure could not be accurately estimated.  ------------------------------------------------------------------- Right atrium:  The atrium was mildly dilated.  ------------------------------------------------------------------- Pericardium:  There was no pericardial effusion.  ------------------------------------------------------------------- Systemic veins: Inferior vena cava: The vessel was normal in size. The respirophasic diameter changes were in the normal range (>= 50%), consistent with normal central venous pressure.  ------------------------------------------------------------------- Measurements   Left ventricle                            Value          Reference  LV ID, ED, PLAX chordal           (L)  42.1  mm       43 - 52  LV ID, ES, PLAX chordal                   26.5  mm       23 - 38  LV fx shortening, PLAX chordal            37    %        >=29  LV PW thickness, ED                       11.6  mm       ---------  IVS/LV PW ratio, ED                       1.25           <=1.3  Stroke volume, 2D                         99    ml       ---------  Stroke volume/bsa, 2D                     50    ml/m^2   ---------  LV e&', lateral                            10.1  cm/s     ---------  LV E/e&', lateral                          12.28          ---------  LV e&', medial                             4.83  cm/s     ---------  LV E/e&', medial                           25.67          ---------  LV e&', average                            7.47  cm/s     ---------  LV E/e&', average                          16.61          ---------    Ventricular septum                        Value          Reference  IVS thickness, ED                         14.5  mm       ---------    LVOT                                      Value          Reference  LVOT ID, S  21    mm       ---------  LVOT area                                 3.46  cm^2     ---------  LVOT ID                                   21    mm       ---------  LVOT peak velocity, S                     103   cm/s     ---------  LVOT mean velocity, S                     75.4  cm/s     ---------  LVOT VTI, S                               28.6  cm       ---------  LVOT peak gradient, S                     4     mm Hg    ---------  Stroke volume (SV), LVOT DP               99.1  ml       ---------  Stroke index (SV/bsa), LVOT DP            49.9  ml/m^2   ---------    Aortic valve                              Value          Reference  Aortic valve peak velocity, S             416   cm/s     ---------  Aortic valve mean velocity, S             292   cm/s     ---------  Aortic valve VTI, S                       98.5  cm       ---------  Aortic mean gradient, S                   37    mm Hg    ---------  Aortic peak gradient, S                   69    mm Hg    ---------  VTI ratio, LVOT/AV                        0.29           ---------  Aortic valve area, VTI                    1     cm^2     ---------  Aortic valve area/bsa, VTI  0.5   cm^2/m^2 ---------  Velocity ratio, peak, LVOT/AV             0.25           ---------  Aortic valve area, peak velocity          0.86  cm^2     ---------  Aortic valve area/bsa, peak               0.43  cm^2/m^2 ---------  velocity  Velocity ratio, mean, LVOT/AV             0.26           ---------  Aortic valve area, mean velocity          0.89  cm^2     ---------  Aortic valve area/bsa, mean               0.45  cm^2/m^2 ---------  velocity  Aortic regurg pressure half-time          400   ms       ---------    Aorta                                     Value          Reference  Aortic root ID, ED                        28    mm       ---------    Left atrium                               Value          Reference  LA ID,  A-P, ES                            45    mm       ---------  LA ID/bsa, A-P                    (H)     2.27  cm/m^2   <=2.2  LA volume, S                              60    ml       ---------  LA volume/bsa, S                          30.2  ml/m^2   ---------  LA volume, ES, 1-p A4C                    48    ml       ---------  LA volume/bsa, ES, 1-p A4C                24.2  ml/m^2   ---------  LA volume, ES, 1-p A2C                    71    ml       ---------  LA volume/bsa, ES, 1-p A2C  35.8  ml/m^2   ---------    Mitral valve                              Value          Reference  Mitral E-wave peak velocity               124   cm/s     ---------  Mitral A-wave peak velocity               111   cm/s     ---------  Mitral deceleration time          (H)     370   ms       150 - 230  Mitral peak gradient, D                   6     mm Hg    ---------  Mitral E/A ratio, peak                    1.1            ---------    Right ventricle                           Value          Reference  RV s&', lateral, S                         14.3  cm/s     ---------  Legend: (L)  and  (H)  mark values outside specified reference range.  ------------------------------------------------------------------- Prepared and Electronically Authenticated by  Sanda Klein, MD 2018-05-08T15:24:59   Right/Left Heart Cath and Coronary Angiography  Conclusion    Mid LAD lesion, 20% stenosed.   1. Mild non-obstructive CAD 2. Moderate aortic stenosis (peak to peak gradient 20 mmHg, AVA 1.1 cm2)  Recommendations: Medical management of CAD. Follow aortic stenosis with serial echocardiograms.    Indications   Aortic stenosis [I35.0 (ICD-10-CM)]  Abnormal nuclear stress test [R94.39 ([id number])]  Complications   Complications documented in old activity   Estimated blood loss <50 mL. Indication: 83 yo female with known moderate AS with recent weakness and dyspnea. Nuclear stress test  with possible ischemia.   Procedure: The risks, benefits, complications, treatment options, and expected outcomes were discussed with the patient. The patient and/or family concurred with the proposed plan, giving informed consent. The patient was brought to the cath lab after IV hydration was begun and oral premedication was given. The patient was further sedated with Versed and Fentanyl. There was an IV catheter present in the left antecubital vein. This area was prepped and draped in a sterile fashion. I then changed out this IV catheter for a 5 French sheath. A balloon tipped catheter was used to perform a right heart catheterization. The left wrist was assessed with a modified Allens test which was positive. The left wrist was prepped and draped in a sterile fashion. 1% lidocaine was used for local anesthesia. Using the modified Seldinger access technique, a 5 French sheath was placed in the left radial artery. 3 mg Verapamil was given through the sheath. 4500 units IV heparin was given. Standard diagnostic catheters were used to perform selective coronary angiography. I crossed the aortic valve with an AL-1 catheter and a  straight wire. The sheath was removed from the right radial artery and a Terumo hemostasis band was applied at the arteriotomy site on the right wrist.   Sedation: During this procedure the patient is administered a total of Versed 1 mg and Fentanyl 25 mcg to achieve and maintain moderate conscious sedation. The patient's heart rate, blood pressure, and oxygen saturation are monitored continuously during the procedure. The period of conscious sedation is 49 minutes, of which I was present face-to-face 100% of this time.      Coronary Findings   Dominance: Right  Left Anterior Descending  . Vessel is large.  Mid LAD lesion, 20% stenosed. Discrete.  Second Diagonal Branch  The vessel is moderate in size.  Third Diagonal Branch  The vessel is small in size.  Left Circumflex   . Vessel is moderate in size.  First Obtuse Marginal Branch  The vessel is small in size.  Second Obtuse Marginal Branch  The vessel is small in size.  Third Obtuse Marginal Branch  The vessel is moderate in size.  Right Coronary Artery  . Vessel is large. Vessel is angiographically normal.  Right Posterior Descending Artery  The vessel is moderate in size.  Left Heart   Aortic Valve There is moderate aortic valve stenosis.    Coronary Diagrams   Diagnostic Diagram       Implants     No implant documentation for this case.  PACS Images   Show images for Cardiac catheterization   Link to Procedure Log   Procedure Log    Hemo Data    Most Recent Value  Fick Cardiac Output 4.75 L/min  Fick Cardiac Output Index 2.5 (L/min)/BSA  Aortic Mean Gradient 23.9 mmHg  Aortic Peak Gradient 20 mmHg  Aortic Valve Area 1.10  Aortic Value Area Index 0.58 cm2/BSA  RA A Wave 10 mmHg  RA V Wave 9 mmHg  RA Mean 7 mmHg  RV Systolic Pressure 38 mmHg  RV Diastolic Pressure 5 mmHg  RV EDP 9 mmHg  PA Systolic Pressure 42 mmHg  PA Diastolic Pressure 6 mmHg  PA Mean 21 mmHg  PW A Wave 16 mmHg  PW V Wave 15 mmHg  PW Mean 11 mmHg  AO Systolic Pressure 939 mmHg  AO Diastolic Pressure 60 mmHg  AO Mean 84 mmHg  LV Systolic Pressure 030 mmHg  LV Diastolic Pressure 8 mmHg  LV EDP 16 mmHg  Arterial Occlusion Pressure Extended Systolic Pressure 092 mmHg  Arterial Occlusion Pressure Extended Diastolic Pressure 61 mmHg  Arterial Occlusion Pressure Extended Mean Pressure 91 mmHg  Left Ventricular Apex Extended Systolic Pressure 330 mmHg  Left Ventricular Apex Extended Diastolic Pressure 8 mmHg  Left Ventricular Apex Extended EDP Pressure 18 mmHg  QP/QS 1  TPVR Index 8.41 HRUI  TSVR Index 33.64 HRUI  PVR SVR Ratio 0.13  TPVR/TSVR Ratio 0.25    EXERCISE TREADMILL STRESS TEST   Blood pressure demonstrated a normal response to exercise.  There was no ST segment deviation noted  during stress.   No ischemia. Moderately impaired exercise capacity, the patient walked for 6 minutes, however with modified Bruce protocol this represents only 3.4 METS.    Stress Findings   ECG Baseline ECG exhibits normal sinus rhythm..    Stress Findings The patient exercised following a modified Bruce protocol.  The patient reported no symptoms during the stress test. The patient experienced no angina during the stress test.   The patient requested the test to be stopped.  Blood pressure and heart rate demonstrated a normal response to exercise. Blood pressure demonstrated a normal response to exercise. Overall, the patient's exercise capacity was moderately impaired.   85% of maximum heart rate was achieved after 4.2 minutes. Recovery time: 5 minutes. The patient's response to exercise was adequate for diagnosis.    Response to Stress There was no ST segment deviation noted during stress.  Arrhythmias during stress: rare PVCs.  Arrhythmias during recovery: rare PVCs.  Arrhythmias were not significant.  ECG was interpretable and conclusive.     Cardiac TAVR CT  TECHNIQUE: The patient was scanned on a Philips 256 scanner. A 120 kV retrospective scan was triggered in the descending thoracic aorta at 111 HU's. Gantry rotation speed was 270 msecs and collimation was .9 mm. 5 mg of IV Metoprolol and no nitro were given. The 3D data set was reconstructed in 5% intervals of the R-R cycle. Systolic and diastolic phases were analyzed on a dedicated work station using MPR, MIP and VRT modes. The patient received 80 cc of contrast.  FINDINGS: Aortic Valve: Trileaflet, severely thickened and calcified aortic valve with severely restricted leaflet opening. There are asymmetric calcifications predominantly in the non-coronary cusp extending into the LVOT.  Aorta:  Normal size, no dissection.  Mild diffuse calcifications.  Sinotubular Junction:  27 x 25 mm  Ascending  Thoracic Aorta:  31 x 30 mm  Aortic Arch:  26 x 26 mm  Descending Thoracic Aorta:  23 x 22 mm  Sinus of Valsalva Measurements:  Non-coronary:  30 mm  Right -coronary:  29 mm  Left -coronary:  30 mm  Coronary Artery Height above Annulus:  Left Main:  14 mm  Right Coronary:  16 mm  Virtual Basal Annulus Measurements:  Maximum/Minimum Diameter:  27 x 21 mm  Perimeter:  77 mm  Area:  447 mm2  Optimum Fluoroscopic Angle for Delivery:  LAO 6 CAU 6  IMPRESSION: 1. Trileaflet, severely thickened and calcified aortic valve with severely restricted leaflet opening. There are asymmetric calcifications predominantly in the non-coronary cusp extending into the LVOT. Annular measurements suitable for delivery of a 26 mm Edwards-SAPIEN 3 valve.  2. Sufficient annulus to coronary distance.  3. Optimum Fluoroscopic Angle for Delivery:  LAO 6 CAU 6  4. No thrombus in the left atrial appendage.  Ena Dawley   Electronically Signed   By: Ena Dawley   On: 06/30/2016 17:25   CT ANGIOGRAPHY CHEST, ABDOMEN AND PELVIS  TECHNIQUE: Multidetector CT imaging through the chest, abdomen and pelvis was performed using the standard protocol during bolus administration of intravenous contrast. Multiplanar reconstructed images and MIPs were obtained and reviewed to evaluate the vascular anatomy.  CONTRAST:  150 mL of Isovue 370.  COMPARISON:  CT the abdomen and pelvis 06/09/2004. Chest CT 03/17/2005.  FINDINGS: CTA CHEST FINDINGS  Cardiovascular: Heart size is mildly enlarged with left atrial dilatation. There is no significant pericardial fluid, thickening or pericardial calcification. There is aortic atherosclerosis, as well as atherosclerosis of the great vessels of the mediastinum and the coronary arteries, including calcified atherosclerotic plaque in the left main and left anterior descending coronary arteries. Calcifications of the  aortic valve. Mild calcifications of the mitral annulus.  Mediastinum/Lymph Nodes: No pathologically enlarged mediastinal or hilar lymph nodes. Esophagus is unremarkable in appearance. No axillary lymphadenopathy.  Lungs/Pleura: 8 x 4 mm nodule in the right middle lobe associated with the minor fissure (axial image 75 of series 7), stable compared to remote prior study  from 2007, considered definitively benign (presumably a subpleural lymph node). No acute consolidative airspace disease. No pleural effusions.  Musculoskeletal/Soft Tissues: There are no aggressive appearing lytic or blastic lesions noted in the visualized portions of the skeleton.  CTA ABDOMEN AND PELVIS FINDINGS  Hepatobiliary: No cystic or solid hepatic lesions. No intra or extrahepatic biliary ductal dilatation. Status post cholecystectomy.  Pancreas: No pancreatic mass. No pancreatic ductal dilatation. No pancreatic or peripancreatic fluid or inflammatory changes.  Spleen: Unremarkable.  Adrenals/Urinary Tract: Bilateral kidneys and bilateral adrenal glands are normal in appearance. No hydroureteronephrosis. Urinary bladder is largely obscured by extensive beam hardening artifact from the patient's bilateral hip arthroplasties.  Stomach/Bowel: The appearance of the stomach is normal. There is no pathologic dilatation of small bowel or colon. The appendix is not confidently identified and may be surgically absent. Regardless, there are no inflammatory changes noted adjacent to the cecum to suggest the presence of an acute appendicitis at this time.  Vascular/Lymphatic: Aortic atherosclerosis with vascular findings and measurements pertinent to potential TAVR procedure, as detailed below. Portions of the common femoral arteries are obscured by beam hardening artifact, as discussed below, and are considered uninterpretable. No aneurysm or dissection noted in the abdominal or pelvic vasculature.  Celiac axis, superior mesenteric artery and inferior mesenteric artery all appear patent without hemodynamically significant stenosis. Single renal arteries bilaterally are patent without hemodynamically significant stenosis. No lymphadenopathy noted in the abdomen or pelvis.  Reproductive: 3.3 x 2.8 x 2.0 cm mass in the fundus of the uterus, nonspecific, but likely to represent a fibroid. Ovaries are atrophic.  Other: Subtle fat containing mass in the retroperitoneum immediately adjacent to the inferior aspect of the pancreatic head and uncinate process measuring approximately 3.4 x 2.4 x 3.7 cm (axial image 161 of series 6 and coronal image 69 of series 9), increased in size compared to remote prior examination from 06/09/2004 at which point this lesion measured 2.1 x 1.6 cm on axial images. No significant volume of ascites. No pneumoperitoneum.  Musculoskeletal: Status post bilateral total hip arthroplasty. There are no aggressive appearing lytic or blastic lesions noted in the visualized portions of the skeleton.  VASCULAR MEASUREMENTS PERTINENT TO TAVR:  AORTA:  Minimal Aortic Diameter -  15 x 15 mm  Severity of Aortic Calcification -  moderate  RIGHT PELVIS:  Right Common Iliac Artery -  Minimal Diameter - 9.3 x 8.3 mm  Tortuosity - mild  Calcification - mild  Right External Iliac Artery -  Minimal Diameter - 6.4 x 6.1 mm  Tortuosity - mild  Calcification - none  Right Common Femoral Artery -  Minimal Diameter - uninterpretable secondary to artifact  Tortuosity - mild  Calcification - mild  LEFT PELVIS:  Left Common Iliac Artery -  Minimal Diameter - 10.1 x 8.4 mm  Tortuosity - mild  Calcification - mild  Left External Iliac Artery -  Minimal Diameter - 6.5 x 6.2 mm  Tortuosity - mild  Calcification - none  Left Common Femoral Artery -  Minimal Diameter - uninterpretable secondary to  artifact.  Tortuosity - mild  Calcification - mild  Review of the MIP images confirms the above findings.  IMPRESSION: 1. Vascular findings and measurements pertinent to potential TAVR procedure, as detailed above. This patient appears likely to have suitable pelvic arterial access bilaterally, however, secondary to extensive beam hardening artifact from the patient's bilateral total hip arthroplasties, portions of the common femoral arteries are completely obscured and cannot be accurately evaluated on  today's examination. However, given the minimal tortuosity in the pelvic vasculature and general mild degree of atherosclerosis throughout the remaining vasculature, it is highly likely that these common femoral arteries are suitable for access. This could be confirmed with catheter arteriography if clinically appropriate. 2. Calcifications of the aortic valve, compatible with the reported clinical history of severe aortic stenosis. 3. Fatty attenuation mass in the retroperitoneum immediately inferior to the pancreatic head and uncinate process measuring 3.4 x 2.4 x 3.7 cm which has slowly grown compared to remote prior examination from 2006. 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 A slow-growing low-grade retroperitoneal liposarcoma is not favored at this time, but is not entirely excluded, and attention on repeat CT of the abdomen and pelvis is suggested in 12 months to ensure stability. 4. Cardiomegaly with left atrial dilatation. 5. Additional incidental findings, as above.   Electronically Signed   By: Vinnie Langton M.D.   On: 06/30/2016 14:10    Impression:  Patient has stage D severe symptomatic aortic stenosis. She presents with slow gradual progression of symptoms of exertional shortness of breath and fatigue consistent with chronic diastolic congestive heart failure, New York Heart Association functional class II. I have  personally reviewed the patient's recent transthoracic echocardiogram and the diagnostic cardiac catheterization performed in 2017. Echocardiogram reveals normal left ventricular systolic function. The aortic valve is trileaflet. There is severe thickening and restricted leaflet mobility involving all 3 leaflets. There is a modest amount of calcification, consistent with likely rheumatic disease. Peak velocity across the aortic valve measured greater than 4.1 m/s corresponding to mean transvalvular gradient estimated 37 mmHg. Diagnostic cardiac catheterization performed in 2017 revealed no significant coronary artery disease, and recent exercise treadmill stress test was felt to be low risk for ischemia. I agree the patient needs aortic valve replacement. Risks associated with conventional surgery would be at least moderately elevated because of the patient's advanced age and comorbid medical problems.  Cardiac-gated CTA of the heart reveals anatomical characteristics consistent with aortic stenosis suitable for treatment by transcatheter aortic valve replacement without any significant complicating features and CTA of the aorta and iliac vessels demonstrate what appears to be adequate pelvic vascular access to facilitate a transfemoral approach.    Plan:  The patient and her daughter were counseled at length regarding treatment alternatives for management of severe symptomatic aortic stenosis. Alternative approaches such as conventional aortic valve replacement, transcatheter aortic valve replacement, and palliative medical therapy were compared and contrasted at length.  The risks associated with conventional surgical aortic valve replacement were been discussed in detail, as were expectations for post-operative convalescence.  Issues specific to transcatheter aortic valve replacement were discussed including questions about long term valve durability, the potential for paravalvular leak, possible increased  risk of need for permanent pacemaker placement, and other technical complications related to the procedure itself.  Long-term prognosis with medical therapy was discussed. This discussion was placed in the context of the patient's own specific clinical presentation and past medical history.  All of their questions been addressed.  The patient hopes to proceed with transcatheter aortic valve replacement in the near future as previously planned.  Following the decision to proceed with transcatheter aortic valve replacement, a discussion has been held regarding what types of management strategies would be attempted intraoperatively in the event of life-threatening complications, including whether or not the patient would be considered a candidate for the use of cardiopulmonary bypass and/or conversion to open sternotomy for attempted surgical intervention.  The patient has been advised of a variety of complications that might develop including but not limited to risks of death, stroke, paravalvular leak, aortic dissection or other major vascular complications, aortic annulus rupture, device embolization, cardiac rupture or perforation, mitral regurgitation, acute myocardial infarction, arrhythmia, heart block or bradycardia requiring permanent pacemaker placement, congestive heart failure, respiratory failure, renal failure, pneumonia, infection, other late complications related to structural valve deterioration or migration, or other complications that might ultimately cause a temporary or permanent loss of functional independence or other long term morbidity.  The patient provides full informed consent for the procedure as described and all questions were answered.   I spent in excess of 90 minutes during the conduct of this office consultation and >50% of this time involved direct face-to-face encounter with the patient for counseling and/or coordination of their care.     Valentina Gu. Roxy Manns,  MD 07/15/2016 12:53 PM

## 2016-07-20 ENCOUNTER — Encounter: Payer: Medicare Other | Admitting: Thoracic Surgery (Cardiothoracic Vascular Surgery)

## 2016-07-21 ENCOUNTER — Encounter (HOSPITAL_COMMUNITY)
Admission: RE | Admit: 2016-07-21 | Discharge: 2016-07-21 | Disposition: A | Discharge status: Home / Self Care | Payer: Medicare Other | Source: Ambulatory Visit | Attending: Surgery | Admitting: Surgery

## 2016-07-21 ENCOUNTER — Encounter (HOSPITAL_COMMUNITY): Payer: Self-pay

## 2016-07-21 ENCOUNTER — Ambulatory Visit (HOSPITAL_COMMUNITY)
Admission: RE | Admit: 2016-07-21 | Discharge: 2016-07-21 | Disposition: A | Discharge status: Home / Self Care | Payer: Medicare Other | Source: Ambulatory Visit | Attending: Cardiovascular Disease | Admitting: Cardiovascular Disease

## 2016-07-21 DIAGNOSIS — I350 Nonrheumatic aortic (valve) stenosis: Secondary | ICD-10-CM | POA: Diagnosis not present

## 2016-07-21 DIAGNOSIS — Z01812 Encounter for preprocedural laboratory examination: Secondary | ICD-10-CM | POA: Diagnosis not present

## 2016-07-21 DIAGNOSIS — R001 Bradycardia, unspecified: Secondary | ICD-10-CM | POA: Diagnosis not present

## 2016-07-21 DIAGNOSIS — Z01810 Encounter for preprocedural cardiovascular examination: Secondary | ICD-10-CM | POA: Insufficient documentation

## 2016-07-21 HISTORY — DX: Dyspnea, unspecified: R06.00

## 2016-07-21 HISTORY — DX: Unspecified osteoarthritis, unspecified site: M19.90

## 2016-07-21 LAB — TYPE AND SCREEN
ABO/RH(D): A POS
ANTIBODY SCREEN: NEGATIVE

## 2016-07-21 LAB — BLOOD GAS, ARTERIAL
Acid-Base Excess: 3.9 mmol/L — ABNORMAL HIGH (ref 0.0–2.0)
BICARBONATE: 28.1 mmol/L — AB (ref 20.0–28.0)
Drawn by: 421801
O2 Saturation: 92.8 %
PH ART: 7.427 (ref 7.350–7.450)
PO2 ART: 64.7 mmHg — AB (ref 83.0–108.0)
Patient temperature: 98.6
pCO2 arterial: 43.3 mmHg (ref 32.0–48.0)

## 2016-07-21 LAB — CBC
HCT: 42.2 % (ref 36.0–46.0)
HEMOGLOBIN: 13.6 g/dL (ref 12.0–15.0)
MCH: 30.4 pg (ref 26.0–34.0)
MCHC: 32.2 g/dL (ref 30.0–36.0)
MCV: 94.4 fL (ref 78.0–100.0)
Platelets: 229 10*3/uL (ref 150–400)
RBC: 4.47 MIL/uL (ref 3.87–5.11)
RDW: 13.5 % (ref 11.5–15.5)
WBC: 4.8 10*3/uL (ref 4.0–10.5)

## 2016-07-21 LAB — COMPREHENSIVE METABOLIC PANEL
ALK PHOS: 61 U/L (ref 38–126)
ALT: 16 U/L (ref 14–54)
AST: 21 U/L (ref 15–41)
Albumin: 4.1 g/dL (ref 3.5–5.0)
Anion gap: 9 (ref 5–15)
BILIRUBIN TOTAL: 0.9 mg/dL (ref 0.3–1.2)
BUN: 19 mg/dL (ref 6–20)
CALCIUM: 9.8 mg/dL (ref 8.9–10.3)
CO2: 25 mmol/L (ref 22–32)
CREATININE: 0.92 mg/dL (ref 0.44–1.00)
Chloride: 102 mmol/L (ref 101–111)
GFR, EST NON AFRICAN AMERICAN: 56 mL/min — AB (ref >60)
Glucose, Bld: 110 mg/dL — ABNORMAL HIGH (ref 65–99)
Potassium: 3.9 mmol/L (ref 3.5–5.1)
Sodium: 136 mmol/L (ref 135–145)
Total Protein: 7.3 g/dL (ref 6.5–8.1)

## 2016-07-21 LAB — URINALYSIS, ROUTINE W REFLEX MICROSCOPIC
Bilirubin Urine: NEGATIVE
GLUCOSE, UA: NEGATIVE mg/dL
HGB URINE DIPSTICK: NEGATIVE
KETONES UR: NEGATIVE mg/dL
Leukocytes, UA: NEGATIVE
Nitrite: NEGATIVE
PROTEIN: NEGATIVE mg/dL
Specific Gravity, Urine: 1.024 (ref 1.005–1.030)
pH: 5 (ref 5.0–8.0)

## 2016-07-21 LAB — PROTIME-INR
INR: 0.98
PROTHROMBIN TIME: 13 s (ref 11.4–15.2)

## 2016-07-21 LAB — SURGICAL PCR SCREEN
MRSA, PCR: NEGATIVE
Staphylococcus aureus: NEGATIVE

## 2016-07-21 LAB — ABO/RH: ABO/RH(D): A POS

## 2016-07-21 LAB — APTT: aPTT: 29 seconds (ref 24–36)

## 2016-07-21 NOTE — Progress Notes (Signed)
PATIENT STATED THAT DR. OWEN INSTRUCTED HER TO CONTINUE ASPIRIN BUT NOT TO TAKE DOS.

## 2016-07-21 NOTE — Pre-Procedure Instructions (Signed)
[name]  07/21/2016      [id number] - [place], [place] - [address] AT [place] [address] [place] [address] Phone: [phone] Fax: [phone]  [facility] [place], [address] AT [place] [address] [address] Phone: [phone] Fax: [phone]    Your procedure is scheduled on  Tuesday  07/27/16  Report to [facility] Admitting at 530 A.M.  Call this number if you have problems the morning of surgery:  [phone]   Remember:  Do not eat food or drink liquids after midnight.  Take these medicines the morning of surgery with A SIP OF WATER   LANSOPRAZOLE (PREVACID), LEVOTHYROXINE (SYNTHROID)  7 days prior to surgery STOP taking any  Aleve, Naproxen, Ibuprofen, Motrin, Advil, Goody's, BC's, all herbal medications, fish oil, and all vitamins   Do not wear jewelry, make-up or nail polish.  Do not wear lotions, powders, or perfumes, or deoderant.  Do not shave 48 hours prior to surgery.  Men may shave face and neck.  Do not bring valuables to the hospital.  [facility] is not responsible for any belongings or valuables.  Contacts, dentures or bridgework may not be worn into surgery.  Leave your suitcase in the car.  After surgery it may be brought to your room.  For patients admitted to the hospital, discharge time will be determined by your treatment team.  Patients discharged the day of surgery will not be allowed to drive home.   Name and phone number of your driver:    Special instructions:  [place] - Preparing for Surgery  Before surgery, you can play an important role.  Because skin is not sterile, your skin needs to be as free of germs as possible.  You can reduce the number of germs on you skin by washing with CHG (chlorahexidine gluconate) soap before surgery.  CHG is an antiseptic cleaner which kills germs and  bonds with the skin to continue killing germs even after washing.  Please DO NOT use if you have an allergy to CHG or antibacterial soaps.  If your skin becomes reddened/irritated stop using the CHG and inform your nurse when you arrive at Short Stay.  Do not shave (including legs and underarms) for at least 48 hours prior to the first CHG shower.  You may shave your face.  Please follow these instructions carefully:   1.  Shower with CHG Soap the night before surgery and the                                morning of Surgery.  2.  If you choose to wash your hair, wash your hair first as usual with your       normal shampoo.  3.  After you shampoo, rinse your hair and body thoroughly to remove the                      Shampoo.  4.  Use CHG as you would any other liquid soap.  You can apply chg directly       to the skin and wash gently with scrungie or a clean washcloth.  5.  Apply the CHG Soap to your body ONLY FROM THE NECK DOWN.        Do  not use on open wounds or open sores.  Avoid contact with your eyes,       ears, mouth and genitals (private parts).  Wash genitals (private parts)       with your normal soap.  6.  Wash thoroughly, paying special attention to the area where your surgery        will be performed.  7.  Thoroughly rinse your body with warm water from the neck down.  8.  DO NOT shower/wash with your normal soap after using and rinsing off       the CHG Soap.  9.  Pat yourself dry with a clean towel.            10.  Wear clean pajamas.            11.  Place clean sheets on your bed the night of your first shower and do not        sleep with pets.  Day of Surgery  Do not apply any lotions/deoderants the morning of surgery.  Please wear clean clothes to the hospital/surgery center.    Please read over the following fact sheets that you were given. Pain Booklet, Coughing and Deep Breathing, MRSA Information and Surgical Site Infection Prevention

## 2016-07-22 LAB — HEMOGLOBIN A1C
HEMOGLOBIN A1C: 5.5 % (ref 4.8–5.6)
MEAN PLASMA GLUCOSE: 111 mg/dL

## 2016-07-22 NOTE — Progress Notes (Signed)
Anesthesia Chart Review: Patient is a 83 year old female scheduled for TAVR, transfemoral approach on 07/27/16 by Dr. Angelena Form and Dr. Cyndia Bent.   History includes never smoker, hypercholesteremia, hypothyroidism, PUD, CKD stage III, mild CAD (20% mid LAD) 07/2015, atypical chest pain, palpitations, SVT '14, rheumatic fever, aortic stenosis, diastolic dysfunction, dyspnea, right breast cancer s/p and chemoradiation, cholecystectomy '16, right THA '15. BMI is consistent with obesity.   PCP is Dr. Gaynelle Arabian. Primary cardiologist is Dr. Fransico Him.   Meds include ASA 81 mg, fish oil, Lasix, Prevacid, levothyroxine, magnesium gluconate, KCl.   BP (!) 131/50   Pulse (!) 58   Temp 36.6 C   Resp 20   Ht 5\' 2"  (1.575 m)   Wt 187 lb 4.8 oz (85 kg)   LMP  (LMP Unknown)   SpO2 97%   BMI 34.26 kg/m   ETT 05/20/16:  Blood pressure demonstrated a normal response to exercise.  There was no ST segment deviation noted during stress. No ischemia. Moderately impaired exercise capacity, the patient walked for 6 minutes, however with modified Bruce protocol this represents only 3.4 METS.   Echo 05/11/16: Study Conclusions - Left ventricle: The cavity size was normal. Wall thickness was   normal. Systolic function was normal. The estimated ejection   fraction was in the range of 55% to 60%. Wall motion was normal;   there were no regional wall motion abnormalities. - Aortic valve: There was moderate to severe stenosis. There was   mild regurgitation. VTI ratio of LVOT to aortic valve: 0.29. Valve area (VTI): 1 cm^2. Indexed valve area (VTI): 0.5 cm^2/m^2. Peak velocity ratio of LVOT to aortic valve: 0.25. Valve area (Vmax): 0.86 cm^2. Indexed valve area (Vmax): 0.43 cm^2/m^2. Mean velocity ratio of LVOT to aortic valve: 0.26. Valve area (Vmean): 0.89 cm^2. Indexed valve area (Vmean): 0.45 cm^2/m^2.    Mean gradient (S): 37 mm Hg. Peak gradient (S): 69 mm Hg. - Mitral valve: There was mild  regurgitation. - Left atrium: The atrium was mildly dilated. - Right atrium: The atrium was mildly dilated. Impressions: - Compared to 05/12/2015, aortic stenosis has progressed and there   is evidence for decompensated diastolic dysfunction.  Cardiac cath 07/18/15:  Mid LAD lesion, 20% stenosed.  1. Mild non-obstructive CAD 2. Moderate aortic stenosis (peak to peak gradient 20 mmHg, AVA 1.1 cm2) Recommendations: Medical management of CAD. Follow aortic stenosis with serial echocardiograms.  Event Monitor 08/06/15:  Normal sinus rhythm with average heart rate 66bpm.  Occasional PVCs and PACs  Carotid U/S 06/30/16: Summary: - Right - 40% to 59% ICA stenosis. Vertebral artery flow is   antegrade. - Left - 40% to 59% ICA stenosis. Plaque morphology does not   support the increase. May possibly be due to tortuosity.   Vertebral artery flow is antegrade.  CTA chest/abd/pelvic 06/30/16: IMPRESSION: 1. Vascular findings and measurements pertinent to potential TAVR procedure, as detailed above. This patient appears likely to have suitable pelvic arterial access bilaterally, however, secondary to extensive beam hardening artifact from the patient's bilateral total hip arthroplasties, portions of the common femoral arteries are completely obscured and cannot be accurately evaluated on today's examination. However, given the minimal tortuosity in the pelvic vasculature and general mild degree of atherosclerosis throughout the remaining vasculature, it is highly likely that these common femoral arteries are suitable for access. This could be confirmed with catheter arteriography if clinically appropriate. 2. Calcifications of the aortic valve, compatible with the reported clinical history of severe aortic  stenosis. 3. Fatty attenuation mass in the retroperitoneum immediately inferior to the pancreatic head and uncinate process measuring 3.4 x 2.4 x 3.7 cm which has slowly grown compared  to remote prior examination from 2006. 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 A slow-growing low-grade retroperitoneal liposarcoma is not favored at this time, but is not entirely excluded, and attention on repeat CT of the abdomen and pelvis is suggested in 12 months to ensure stability. 4. Cardiomegaly with left atrial dilatation. 5. Additional incidental findings, as above (see Results Review tab).  Spirometry 06/17/16: FVC 1.29 (60%), FEV1 0.79 (50%). She could not perform DLCO. (Dr. Radford Pax recommended pulmonology referral to Dr. Chase Caller, but patient declined until TAVR work-up completed.)  Preoperative EKG, cardiac CT, CXR noted.  Preoperative labs and ABG reviewed.   If no acute changes then I would anticipate that she can proceed as planned.  George Hugh [facility] Short Stay Center/Anesthesiology Phone [phone] 07/22/2016 11:05 AM

## 2016-07-23 ENCOUNTER — Other Ambulatory Visit (HOSPITAL_COMMUNITY): Payer: Medicare Other

## 2016-07-26 MED ORDER — DEXTROSE 5 % IV SOLN
1.5000 g | INTRAVENOUS | Status: AC
  Administered 2016-07-27: 1.5 g via INTRAVENOUS
  Filled 2016-07-26 (×2): qty 1.5

## 2016-07-26 MED ORDER — DEXMEDETOMIDINE HCL IN NACL 400 MCG/100ML IV SOLN
0.1000 ug/kg/h | INTRAVENOUS | Status: DC
  Filled 2016-07-26: qty 100

## 2016-07-26 MED ORDER — NOREPINEPHRINE BITARTRATE 1 MG/ML IV SOLN
0.0000 ug/min | INTRAVENOUS | Status: DC
  Filled 2016-07-26: qty 4

## 2016-07-26 MED ORDER — SODIUM CHLORIDE 0.9 % IV SOLN
INTRAVENOUS | Status: DC
  Filled 2016-07-26: qty 30

## 2016-07-26 MED ORDER — MAGNESIUM SULFATE 50 % IJ SOLN
40.0000 meq | INTRAMUSCULAR | Status: DC
  Filled 2016-07-26: qty 10

## 2016-07-26 MED ORDER — SODIUM CHLORIDE 0.9 % IV SOLN
30.0000 ug/min | INTRAVENOUS | Status: DC
  Filled 2016-07-26: qty 2

## 2016-07-26 MED ORDER — DOPAMINE-DEXTROSE 3.2-5 MG/ML-% IV SOLN
0.0000 ug/kg/min | INTRAVENOUS | Status: DC
Start: 2016-07-27 — End: 2016-07-27
  Filled 2016-07-26: qty 250

## 2016-07-26 MED ORDER — SODIUM CHLORIDE 0.9 % IV SOLN
INTRAVENOUS | Status: DC
  Filled 2016-07-26: qty 1

## 2016-07-26 MED ORDER — POTASSIUM CHLORIDE 2 MEQ/ML IV SOLN
80.0000 meq | INTRAVENOUS | Status: DC
  Filled 2016-07-26: qty 40

## 2016-07-26 MED ORDER — NITROGLYCERIN IN D5W 200-5 MCG/ML-% IV SOLN
2.0000 ug/min | INTRAVENOUS | Status: DC
  Filled 2016-07-26: qty 250

## 2016-07-26 MED ORDER — VANCOMYCIN HCL 10 G IV SOLR
1500.0000 mg | INTRAVENOUS | Status: AC
  Administered 2016-07-27: 1500 mg via INTRAVENOUS
  Filled 2016-07-26: qty 1500

## 2016-07-26 MED ORDER — EPINEPHRINE PF 1 MG/ML IJ SOLN
0.0000 ug/min | INTRAVENOUS | Status: DC
  Filled 2016-07-26: qty 4

## 2016-07-26 NOTE — H&P (Signed)
[address]Suite [address]       [place] [address]             [phone]      Cardiothoracic Surgery Admission History and Physical   Referring Provider is Turner, Eber Hong, MD PCP is Gaynelle Arabian, MD      Chief Complaint  Patient presents with  . Aortic Stenosis        HPI:  The patient is an 83 year old woman with a hx of chronic kidney disease, hypercholesterolemia, hypothyroidism, history of right breast cancer s/p lumpectomy followed by chemo/XRT, 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 An echo in 05/2015 showed moderate AS with a mean gradient of 31 mm Hg and normal LV function. She was having some exertional fatigue at that time. She underwent a nuclear stress test on 07/14/2015 that showed ischemia in the mid and basal inferolateral walls. She says that she got very short of breath with the stress test. She then had a cardiac cath on 07/17/2016 showing mild non-obstructive disease in the mid LAD. The peak to peak AV gradient at that time was 20 mm Hg and the mean was 23.9 mm Hg. Since then she has had progressive shortness of breath and fatigue with exertion. She has no chest pain or dizziness. She has had frequently coughing. Her most recent echo on 05/11/2016 showed an LVEF of 55-60% with a mean AV gradient of 37 mm Hg and a peak of 69 mm Hg. She had an ETT performed by Dr. Radford Pax on 05/20/2016 which showed no ischemia with moderately impaired exercise capacity. She was referred to Dr. Burt Knack and underwent work up for TAVR.  She is widowed and lives independently in [place]. He daughter [name] is with her today and she is a long time OR nurse who used to work on our heart team. The patient has a lot of friends and keeps very busy.      Past Medical History:  Diagnosis Date  . Aortic stenosis    moderate by cath 07/2015  . Atypical chest pain   . Breast cancer, right  breast ([place])   . CAD (coronary artery disease), native coronary artery 10/20/2015   20% mid LAD  . Cancer ([place]) 2006   SURGERY AND CHEMO AND RADIATION   . CKD (chronic kidney disease), stage III   . Diastolic dysfunction   . Hypercholesteremia   . Hypothyroidism   . Osteopenia   . PUD (peptic ulcer disease)   . Rheumatic fever   . SVT (supraventricular tachycardia) [facility]) October 2014   converted with vagal maneuver  . Vitamin D deficiency          Past Surgical History:  Procedure Laterality Date  . BREAST SURGERY     RIGHT LUMPECTOMY AND REMOVAL OF LYMPH NODES  . CARDIAC CATHETERIZATION N/A 07/18/2015   Procedure: Right/Left Heart Cath and Coronary Angiography;  Surgeon: [name], MD;  Location: [facility];  Service: Cardiovascular;  Laterality: N/A;  . CHOLECYSTECTOMY N/A 08/06/2014   Procedure: LAPAROSCOPIC CHOLECYSTECTOMY ;  Surgeon: Rolm Bookbinder, MD;  Location: [place];  Service: General;  Laterality: N/A;  . REPLACEMENT TOTAL KNEE Right 2009  . THYROID SURGERY     NODULE REMOVED  . TOTAL HIP ARTHROPLASTY Left 2003  . TOTAL HIP ARTHROPLASTY Right 08/28/2013   Procedure: RIGHT TOTAL HIP ARTHROPLASTY ANTERIOR APPROACH;  Surgeon:  Mauri Pole, MD;  Location: [facility];  Service: Orthopedics;  Laterality: Right;         Family History  Problem Relation Age of Onset  . Heart disease Mother   . Heart disease Father     Social History        Social History  . Marital status: Widowed    Spouse name: N/A  . Number of children: N/A  . Years of education: N/A      Occupational History  . Not on file.       Social History Main Topics  . Smoking status: Never Smoker  . Smokeless tobacco: Never Used  . Alcohol use No  . Drug use: No  . Sexual activity: Not on file       Other Topics Concern  . Not on file      Social History Narrative  . No narrative on file          Current Outpatient  Prescriptions  Medication Sig Dispense Refill  . aspirin EC 81 MG tablet Take 81 mg by mouth daily.    . Calcium Carbonate-Vitamin D (CALCIUM + D PO) Take 1 tablet by mouth daily.    . chlorpheniramine (CHLOR-TRIMETON) 4 MG tablet Take 4 mg by mouth daily.    . Coenzyme Q10 (COQ-10 PO) Take 1 tablet by mouth daily.     . Cyanocobalamin (VITAMIN B-12 PO) Take 1 tablet by mouth daily.    . fish oil-omega-3 fatty acids 1000 MG capsule Take 1 g by mouth daily.    . furosemide (LASIX) 20 MG tablet Take 1 tablet (20 mg total) by mouth every evening. 90 tablet 3  . lansoprazole (PREVACID) 30 MG capsule Take 30 mg by mouth daily.    Marland Kitchen levothyroxine (SYNTHROID, LEVOTHROID) 112 MCG tablet Take 112 mcg by mouth daily before breakfast.    . magnesium gluconate (MAGONATE) 500 MG tablet Take 500 mg by mouth 2 (two) times daily.     . Multiple Vitamin (MULTIVITAMIN WITH MINERALS) TABS tablet Take 1 tablet by mouth daily.    . potassium chloride (K-DUR,KLOR-CON) 10 MEQ tablet Take 1 tablet (10 mEq total) by mouth daily. 90 tablet 3  . Vitamin D, Ergocalciferol, (DRISDOL) 50000 UNITS CAPS capsule Take 50,000 Units by mouth every 7 (seven) days. Saturday     No current facility-administered medications for this visit.          Allergies  Allergen Reactions  . Atorvastatin     Myalgias 10/2011  . Compazine [Prochlorperazine]     unknown  . Fenofibrate     Rash 09/2011  . Floxin [Ofloxacin]     unknown  . Livalo [Pitavastatin] Other (See Comments)    Leg pain  . Pravastatin Sodium     mylgias 09/2011  . Simvastatin     Elevated lft's 06/2011      Review of Systems:              General:                      normal appetite, reduced energy, no weight gain, no weight loss, no fever             Cardiac:                       no chest pain with exertion, no chest pain at rest, has SOB with moderate exertion, no resting SOB, no  PND, no orthopnea, no  palpitations, no arrhythmia, no atrial fibrillation, mile LE edema, no dizzy spells, no syncope             Respiratory:                 exertional shortness of breath, no home oxygen, no productive cough, has dry cough, no bronchitis, no wheezing, no hemoptysis, no asthma, no pain with inspiration or cough, no sleep apnea, no CPAP at night             GI:                               no difficulty swallowing, no reflux, no frequent heartburn, no hiatal hernia, no abdominal pain, no constipation, no diarrhea, no hematochezia, no hematemesis, no melena             GU:                              no dysuria,  no frequency, no urinary tract infection, no hematuria, no kidney stones, chronic kidney disease             Vascular:                     no pain suggestive of claudication, no pain in feet, no leg cramps, no varicose veins, no DVT, no non-healing foot ulcer             Neuro:                         no stroke, no TIA's, no seizures, no headaches, no temporary blindness one eye,  no slurred speech, no peripheral neuropathy, no chronic pain, mild instability of gait, no memory/cognitive dysfunction             Musculoskeletal:         has arthritis, no joint swelling, no myalgias, no difficulty walking, normal mobility              Skin:                            no rash, no itching, no skin infections, no pressure sores or ulcerations             Psych:                         no anxiety, no depression, no nervousness, no unusual recent stress             Eyes:                           no blurry vision, no floaters, no recent vision changes,  wears glasses              ENT:                            no hearing loss, no loose or painful teeth, no dentures, last saw dentist this year and no problems found.             Hematologic:  no easy bruising, no abnormal bleeding, no clotting disorder, no frequent epistaxis             Endocrine:                   no diabetes, does not check  CBG's at home                                                       Physical Exam:              BP 128/71   Pulse 63   Resp 20   Ht 5\' 3"  (1.6 m)   Wt 183 lb (83 kg)   LMP  (LMP Unknown)   SpO2 93% Comment: RA  BMI 32.42 kg/m              General:                      Elderly but  well-appearing             HEENT:                       Unremarkable, NCAT, PERLA, EOMI, oropharynx clear             Neck:                           no JVD, no bruits, no adenopathy or thryromegaly             Chest:                          clear to auscultation, symmetrical breath sounds, no wheezes, no rhonchi              CV:                              RRR, grade III/VI crescendo/decrescendo murmur heard best at RSB,  no diastolic murmur             Abdomen:                    soft, non-tender, no masses or organomegaly             Extremities:                 warm, well-perfused, pulses palpable in feet, no LE edema             Rectal/GU                   Deferred             Neuro:                         Grossly non-focal and symmetrical throughout             Skin:                            Clean and dry, no rashes, no breakdown   Diagnostic Tests:  [facility] [address], [place] [address] [phone]  ------------------------------------------------------------------- Transthoracic Echocardiography  Patient: [name], [name] MR #: [id number] Study Date: 05/11/2016 Gender: F Age: 32 Height: 160 cm Weight: 85.5 kg BSA: 1.98 m^2 Pt. Status: Room:  ORDERING Fransico Him, MD REFERRING Fransico Him, MD SONOGRAPHER Cindy Hazy, RDCS ATTENDING Sanda Klein, MD PERFORMING [facility], Outpatient  cc:  ------------------------------------------------------------------- LV EF: 55% -  60%  ------------------------------------------------------------------- Indications: I35.9 Aortic Valve Disorder.  ------------------------------------------------------------------- History: PMH: Acquired from the patient and from the patient&'s chart. PMH: History of Breast Cancer. CAD. SVT. CHF. Risk factors: Dyslipidemia.  ------------------------------------------------------------------- Study Conclusions  - Left ventricle: The cavity size was normal. Wall thickness was normal. Systolic function was normal. The estimated ejection fraction was in the range of 55% to 60%. Wall motion was normal; there were no regional wall motion abnormalities. - Aortic valve: There was moderate to severe stenosis. There was mild regurgitation. - Mitral valve: There was mild regurgitation. - Left atrium: The atrium was mildly dilated. - Right atrium: The atrium was mildly dilated.  Impressions:  - Compared to 05/12/2015, aortic stenosis has progressed and there is evidence for decompensated diastolic dysfunction.  ------------------------------------------------------------------- Study data: Study status: Routine. Procedure: The patient reported no pain pre or post test. Transthoracic echocardiography for left ventricular function evaluation, for right ventricular function evaluation, and for assessment of valvular function. Image quality was adequate. Study completion: There were no complications. Transthoracic echocardiography. M-mode, complete 2D, spectral Doppler, and color Doppler. Birthdate: Patient birthdate: 28-May-1931. Age: Patient is 83 yr old. Sex: Gender: female. BMI: 33.4 kg/m^2. Patient status: Outpatient. Study date: Study date: 05/11/2016. Study time: 08:48 AM. Location: [facility]  -------------------------------------------------------------------  ------------------------------------------------------------------- Left ventricle: The cavity size was normal. Wall thickness was normal. Systolic function was normal. The estimated ejection fraction was in the range of 55% to 60%. Wall motion was normal; there were no regional wall motion abnormalities.  ------------------------------------------------------------------- Aortic valve: Trileaflet; moderately thickened, moderately calcified leaflets. Doppler: There was moderate to severe stenosis. There was mild regurgitation. VTI ratio of LVOT to aortic valve: 0.29. Valve area (VTI): 1 cm^2. Indexed valve area (VTI): 0.5 cm^2/m^2. Peak velocity ratio of LVOT to aortic valve: 0.25. Valve area (Vmax): 0.86 cm^2. Indexed valve area (Vmax): 0.43 cm^2/m^2. Mean velocity ratio of LVOT to aortic valve: 0.26. Valve area (Vmean): 0.89 cm^2. Indexed valve area (Vmean): 0.45 cm^2/m^2. Mean gradient (S): 37 mm Hg. Peak gradient (S): 69 mm Hg.  ------------------------------------------------------------------- Aorta: Aortic root: The aortic root was normal in size. Ascending aorta: The ascending aorta was normal in size.  ------------------------------------------------------------------- Mitral valve: Mildly thickened leaflets . Doppler: There was mild regurgitation. Peak gradient (D): 6 mm Hg.  ------------------------------------------------------------------- Left atrium: The atrium was mildly dilated.  ------------------------------------------------------------------- Right ventricle: The cavity size was normal. Systolic function was normal.  ------------------------------------------------------------------- Pulmonic valve: Poorly visualized. The valve appears to be grossly normal. Cusp separation was normal. Doppler: Transvalvular velocity was within the normal range.  There was no regurgitation.  ------------------------------------------------------------------- Tricuspid valve: Structurally normal valve. Leaflet separation was normal. Doppler: Transvalvular velocity was within the normal range. There was no regurgitation.  ------------------------------------------------------------------- Pulmonary artery: Systolic pressure could not be accurately estimated.  ------------------------------------------------------------------- Right atrium: The atrium was mildly dilated.  ------------------------------------------------------------------- Pericardium: There was no pericardial effusion.  ------------------------------------------------------------------- Systemic veins: Inferior vena cava: The vessel was normal in size. The respirophasic diameter changes were in the normal range (>= 50%), consistent with normal central venous pressure.  ------------------------------------------------------------------- Measurements  Left ventricle Value  Reference LV ID, ED, PLAX chordal (L) 42.1 mm 43 - 52 LV ID, ES, PLAX chordal 26.5 mm 23 - 38 LV fx shortening, PLAX chordal 37 % >=29 LV PW thickness, ED 11.6 mm --------- IVS/LV PW ratio, ED 1.25 <=1.3 Stroke volume, 2D 99 ml --------- Stroke volume/bsa, 2D 50 ml/m^2 --------- LV e&', lateral 10.1 cm/s --------- LV E/e&', lateral 12.28 --------- LV e&', medial 4.83 cm/s --------- LV E/e&', medial 25.67 --------- LV e&', average 7.47 cm/s --------- LV E/e&', average  16.61 ---------  Ventricular septum Value Reference IVS thickness, ED 14.5 mm ---------  LVOT Value Reference LVOT ID, S 21 mm --------- LVOT area 3.46 cm^2 --------- LVOT ID 21 mm --------- LVOT peak velocity, S 103 cm/s --------- LVOT mean velocity, S 75.4 cm/s --------- LVOT VTI, S 28.6 cm --------- LVOT peak gradient, S 4 mm Hg --------- Stroke volume (SV), LVOT DP 99.1 ml --------- Stroke index (SV/bsa), LVOT DP 49.9 ml/m^2 ---------  Aortic valve Value Reference Aortic valve peak velocity, S 416 cm/s --------- Aortic valve mean velocity, S 292 cm/s --------- Aortic valve VTI, S 98.5 cm --------- Aortic mean gradient, S 37 mm Hg --------- Aortic peak gradient, S 69 mm Hg --------- VTI ratio, LVOT/AV 0.29 --------- Aortic valve area, VTI 1 cm^2 --------- Aortic valve area/bsa, VTI 0.5 cm^2/m^2 --------- Velocity ratio, peak, LVOT/AV 0.25 --------- Aortic valve area, peak velocity 0.86 cm^2 --------- Aortic valve area/bsa, peak 0.43 cm^2/m^2 --------- velocity Velocity ratio, mean, LVOT/AV 0.26 --------- Aortic valve area, mean velocity 0.89 cm^2 --------- Aortic valve area/bsa, mean 0.45 cm^2/m^2  --------- velocity Aortic regurg pressure half-time 400 ms ---------  Aorta Value Reference Aortic root ID, ED 28 mm ---------  Left atrium Value Reference LA ID, A-P, ES 45 mm --------- LA ID/bsa, A-P (H) 2.27 cm/m^2 <=2.2 LA volume, S 60 ml --------- LA volume/bsa, S 30.2 ml/m^2 --------- LA volume, ES, 1-p A4C 48 ml --------- LA volume/bsa, ES, 1-p A4C 24.2 ml/m^2 --------- LA volume, ES, 1-p A2C 71 ml --------- LA volume/bsa, ES, 1-p A2C 35.8 ml/m^2 ---------  Mitral valve Value Reference Mitral E-wave peak velocity 124 cm/s --------- Mitral A-wave peak velocity 111 cm/s --------- Mitral deceleration time (H) 370 ms 150 - 230 Mitral peak gradient, D 6 mm Hg --------- Mitral E/A ratio, peak 1.1 ---------  Right ventricle Value Reference RV s&', lateral, S 14.3 cm/s ---------  Legend: (L) and (H) mark values outside specified reference range.  ------------------------------------------------------------------- Prepared and Electronically Authenticated by  Sanda Klein, MD 2018-05-08T15:24:59  Physicians   Panel Physicians Referring Physician Case Authorizing Physician  [name], MD (Primary)    Procedures   Right/Left Heart Cath and Coronary Angiography  Conclusion    Mid LAD lesion, 20% stenosed.  1. Mild non-obstructive CAD 2. Moderate aortic  stenosis (peak to peak gradient 20 mmHg, AVA 1.1 cm2)  Recommendations: Medical management of CAD. Follow aortic stenosis with serial echocardiograms.    Indications   Aortic stenosis [I35.0 (ICD-10-CM)]  Abnormal nuclear stress test [R94.39 ([id number])]  Complications   Complications documented in old activity   Estimated blood loss <50 mL. Indication: 83 yo female with known moderate AS with recent weakness and dyspnea. Nuclear stress test with possible ischemia.   Procedure: The risks, benefits, complications, treatment options, and expected outcomes were discussed with the patient. The patient and/or family concurred with the proposed plan, giving informed consent. The patient was brought to the cath lab after IV hydration was begun and oral premedication was given. The  patient was further sedated with Versed and Fentanyl. There was an IV catheter present in the left antecubital vein. This area was prepped and draped in a sterile fashion. I then changed out this IV catheter for a 5 French sheath. A balloon tipped catheter was used to perform a right heart catheterization. The left wrist was assessed with a modified Allens test which was positive. The left wrist was prepped and draped in a sterile fashion. 1% lidocaine was used for local anesthesia. Using the modified Seldinger access technique, a 5 French sheath was placed in the left radial artery. 3 mg Verapamil was given through the sheath. 4500 units IV heparin was given. Standard diagnostic catheters were used to perform selective coronary angiography. I crossed the aortic valve with an AL-1 catheter and a straight wire. The sheath was removed from the right radial artery and a Terumo hemostasis band was applied at the arteriotomy site on the right wrist.   Sedation: During this procedure the patient is administered a total of Versed 1 mg and Fentanyl 25 mcg to achieve and maintain moderate conscious sedation. The patient's heart rate,  blood pressure, and oxygen saturation are monitored continuously during the procedure. The period of conscious sedation is 49 minutes, of which I was present face-to-face 100% of this time.      Coronary Findings   Dominance: Right  Left Anterior Descending  . Vessel is large.  Mid LAD lesion, 20% stenosed. Discrete.  Second Diagonal Branch  The vessel is moderate in size.  Third Diagonal Branch  The vessel is small in size.  Left Circumflex  . Vessel is moderate in size.  First Obtuse Marginal Branch  The vessel is small in size.  Second Obtuse Marginal Branch  The vessel is small in size.  Third Obtuse Marginal Branch  The vessel is moderate in size.  Right Coronary Artery  . Vessel is large. Vessel is angiographically normal.  Right Posterior Descending Artery  The vessel is moderate in size.  Left Heart   Aortic Valve There is moderate aortic valve stenosis.    Coronary Diagrams   Diagnostic Diagram       Implants        No implant documentation for this case.  PACS Images   Show images for Cardiac catheterization   Link to Procedure Log   Procedure Log    Hemo Data    Most Recent Value  Fick Cardiac Output 4.75 L/min  Fick Cardiac Output Index 2.5 (L/min)/BSA  Aortic Mean Gradient 23.9 mmHg  Aortic Peak Gradient 20 mmHg  Aortic Valve Area 1.10  Aortic Value Area Index 0.58 cm2/BSA  RA A Wave 10 mmHg  RA V Wave 9 mmHg  RA Mean 7 mmHg  RV Systolic Pressure 38 mmHg  RV Diastolic Pressure 5 mmHg  RV EDP 9 mmHg  PA Systolic Pressure 42 mmHg  PA Diastolic Pressure 6 mmHg  PA Mean 21 mmHg  PW A Wave 16 mmHg  PW V Wave 15 mmHg  PW Mean 11 mmHg  AO Systolic Pressure 073 mmHg  AO Diastolic Pressure 60 mmHg  AO Mean 84 mmHg  LV Systolic Pressure 710 mmHg  LV Diastolic Pressure 8 mmHg  LV EDP 16 mmHg  Arterial Occlusion Pressure Extended Systolic Pressure 626 mmHg  Arterial Occlusion Pressure Extended Diastolic Pressure 61 mmHg  Arterial  Occlusion Pressure Extended Mean Pressure 91 mmHg  Left Ventricular Apex Extended Systolic Pressure 948 mmHg  Left Ventricular Apex Extended Diastolic Pressure 8  mmHg  Left Ventricular Apex Extended EDP Pressure 18 mmHg  QP/QS 1  TPVR Index 8.41 HRUI  TSVR Index 33.64 HRUI  PVR SVR Ratio 0.13  TPVR/TSVR Ratio 0.25    CT CORONARY MORPH W/CTA COR W/SCORE W/CA W/CM &/OR WO/CM (Accession [id number]) (Order [id number])  Imaging  Date: 06/30/2016 Department: [facility] CT IMAGING Released By: Reggy Eye Authorizing: Sherren Mocha, MD  Exam Information   Status Exam Begun  Exam Ended   Final [99] 06/30/2016 10:31 AM 06/30/2016 11:17 AM  PACS Images   Show images for CT CORONARY MORPH W/CTA COR W/SCORE W/CA W/CM &/OR WO/CM  Addendum   ADDENDUM REPORT: 06/30/2016 17:25  CLINICAL DATA: 83 year old female with severe aortic stenosis.  EXAM: Cardiac TAVR CT  TECHNIQUE: The patient was scanned on a Philips 256 scanner. A 120 kV retrospective scan was triggered in the descending thoracic aorta at 111 HU's. Gantry rotation speed was 270 msecs and collimation was .9 mm. 5 mg of IV Metoprolol and no nitro were given. The 3D data set was reconstructed in 5% intervals of the R-R cycle. Systolic and diastolic phases were analyzed on a dedicated work station using MPR, MIP and VRT modes. The patient received 80 cc of contrast.  FINDINGS: Aortic Valve: Trileaflet, severely thickened and calcified aortic valve with severely restricted leaflet opening. There are asymmetric calcifications predominantly in the non-coronary cusp extending into the LVOT.  Aorta: Normal size, no dissection. Mild diffuse calcifications.  Sinotubular Junction: 27 x 25 mm  Ascending Thoracic Aorta: 31 x 30 mm  Aortic Arch: 26 x 26 mm  Descending Thoracic Aorta: 23 x 22 mm  Sinus of Valsalva Measurements:  Non-coronary: 30 mm  Right -coronary: 29  mm  Left -coronary: 30 mm  Coronary Artery Height above Annulus:  Left Main: 14 mm  Right Coronary: 16 mm  Virtual Basal Annulus Measurements:  Maximum/Minimum Diameter: 27 x 21 mm  Perimeter: 77 mm  Area: 447 mm2  Optimum Fluoroscopic Angle for Delivery: LAO 6 CAU 6  IMPRESSION: 1. Trileaflet, severely thickened and calcified aortic valve with severely restricted leaflet opening. There are asymmetric calcifications predominantly in the non-coronary cusp extending into the LVOT. Annular measurements suitable for delivery of a 26 mm Edwards-SAPIEN 3 valve.  2. Sufficient annulus to coronary distance.  3. Optimum Fluoroscopic Angle for Delivery: LAO 6 CAU 6  4. No thrombus in the left atrial appendage.  Ena Dawley   Electronically Signed By: Ena Dawley On: 06/30/2016 17:25   Addended by Dorothy Spark, MD on 06/30/2016 5:27 PM    Study Result   EXAM: OVER-READ INTERPRETATION CT CHEST  The following report is an over-read performed by radiologist Dr. Collene Leyden [facility] [place] on 06/30/2016. This over-read does not include interpretation of cardiac or coronary anatomy or pathology. The coronary CTA interpretation by the cardiologist is attached.  COMPARISON: None.  FINDINGS: Cardiovascular: Heart is upper limits normal in size. Aorta is normal caliber.  Mediastinum/Nodes: No mediastinal, hilar, or axillary adenopathy. Trachea and esophagus are unremarkable.  Lungs/Pleura: No confluent airspace opacities or effusions.  Upper Abdomen: Imaging into the upper abdomen shows no acute findings.  Musculoskeletal: Chest wall soft tissues are unremarkable. No acute bony abnormality.  IMPRESSION: No acute or significant extracardiac abnormality.  Electronically Signed: By: Rolm Baptise M.D. On: 06/30/2016 11:25       CT Angio Abd/Pel w/ and/or w/o (Accession [id number]) (Order  [id number])  Imaging  Date: 06/30/2016 Department: [facility]  CT IMAGING Released By: Theodore Demark, RT Authorizing: Sherren Mocha, MD  Exam Information   Status Exam Begun  Exam Ended   Final [99] 06/30/2016 10:35 AM 06/30/2016 11:18 AM  PACS Images   Show images for CT Angio Abd/Pel w/ and/or w/o  Study Result   CLINICAL DATA: 83 year old female with history of severe aortic stenosis. Preprocedural study prior to potential transcatheter aortic valve replacement (TAVR) procedure.  EXAM: CT ANGIOGRAPHY CHEST, ABDOMEN AND PELVIS  TECHNIQUE: Multidetector CT imaging through the chest, abdomen and pelvis was performed using the standard protocol during bolus administration of intravenous contrast. Multiplanar reconstructed images and MIPs were obtained and reviewed to evaluate the vascular anatomy.  CONTRAST: 150 mL of Isovue 370.  COMPARISON: CT the abdomen and pelvis 06/09/2004. Chest CT 03/17/2005.  FINDINGS: CTA CHEST FINDINGS  Cardiovascular: Heart size is mildly enlarged with left atrial dilatation. There is no significant pericardial fluid, thickening or pericardial calcification. There is aortic atherosclerosis, as well as atherosclerosis of the great vessels of the mediastinum and the coronary arteries, including calcified atherosclerotic plaque in the left main and left anterior descending coronary arteries. Calcifications of the aortic valve. Mild calcifications of the mitral annulus.  Mediastinum/Lymph Nodes: No pathologically enlarged mediastinal or hilar lymph nodes. Esophagus is unremarkable in appearance. No axillary lymphadenopathy.  Lungs/Pleura: 8 x 4 mm nodule in the right middle lobe associated with the minor fissure (axial image 75 of series 7), stable compared to remote prior study from 2007, considered definitively benign (presumably a subpleural lymph node). No acute consolidative airspace disease. No pleural  effusions.  Musculoskeletal/Soft Tissues: There are no aggressive appearing lytic or blastic lesions noted in the visualized portions of the skeleton.  CTA ABDOMEN AND PELVIS FINDINGS  Hepatobiliary: No cystic or solid hepatic lesions. No intra or extrahepatic biliary ductal dilatation. Status post cholecystectomy.  Pancreas: No pancreatic mass. No pancreatic ductal dilatation. No pancreatic or peripancreatic fluid or inflammatory changes.  Spleen: Unremarkable.  Adrenals/Urinary Tract: Bilateral kidneys and bilateral adrenal glands are normal in appearance. No hydroureteronephrosis. Urinary bladder is largely obscured by extensive beam hardening artifact from the patient's bilateral hip arthroplasties.  Stomach/Bowel: The appearance of the stomach is normal. There is no pathologic dilatation of small bowel or colon. The appendix is not confidently identified and may be surgically absent. Regardless, there are no inflammatory changes noted adjacent to the cecum to suggest the presence of an acute appendicitis at this time.  Vascular/Lymphatic: Aortic atherosclerosis with vascular findings and measurements pertinent to potential TAVR procedure, as detailed below. Portions of the common femoral arteries are obscured by beam hardening artifact, as discussed below, and are considered uninterpretable. No aneurysm or dissection noted in the abdominal or pelvic vasculature. Celiac axis, superior mesenteric artery and inferior mesenteric artery all appear patent without hemodynamically significant stenosis. Single renal arteries bilaterally are patent without hemodynamically significant stenosis. No lymphadenopathy noted in the abdomen or pelvis.  Reproductive: 3.3 x 2.8 x 2.0 cm mass in the fundus of the uterus, nonspecific, but likely to represent a fibroid. Ovaries are atrophic.  Other: Subtle fat containing mass in the retroperitoneum immediately adjacent to the  inferior aspect of the pancreatic head and uncinate process measuring approximately 3.4 x 2.4 x 3.7 cm (axial image 161 of series 6 and coronal image 69 of series 9), increased in size compared to remote prior examination from 06/09/2004 at which point this lesion measured 2.1 x 1.6 cm on axial images. No significant volume of ascites. No pneumoperitoneum.  Musculoskeletal: Status post bilateral total hip arthroplasty. There are no aggressive appearing lytic or blastic lesions noted in the visualized portions of the skeleton.  VASCULAR MEASUREMENTS PERTINENT TO TAVR:  AORTA:  Minimal Aortic Diameter - 15 x 15 mm  Severity of Aortic Calcification - moderate  RIGHT PELVIS:  Right Common Iliac Artery -  Minimal Diameter - 9.3 x 8.3 mm  Tortuosity - mild  Calcification - mild  Right External Iliac Artery -  Minimal Diameter - 6.4 x 6.1 mm  Tortuosity - mild  Calcification - none  Right Common Femoral Artery -  Minimal Diameter - uninterpretable secondary to artifact  Tortuosity - mild  Calcification - mild  LEFT PELVIS:  Left Common Iliac Artery -  Minimal Diameter - 10.1 x 8.4 mm  Tortuosity - mild  Calcification - mild  Left External Iliac Artery -  Minimal Diameter - 6.5 x 6.2 mm  Tortuosity - mild  Calcification - none  Left Common Femoral Artery -  Minimal Diameter - uninterpretable secondary to artifact.  Tortuosity - mild  Calcification - mild  Review of the MIP images confirms the above findings.  IMPRESSION: 1. Vascular findings and measurements pertinent to potential TAVR procedure, as detailed above. This patient appears likely to have suitable pelvic arterial access bilaterally, however, secondary to extensive beam hardening artifact from the patient's bilateral total hip arthroplasties, portions of the common femoral arteries are completely obscured and cannot be accurately evaluated on  today's examination. However, given the minimal tortuosity in the pelvic vasculature and general mild degree of atherosclerosis throughout the remaining vasculature, it is highly likely that these common femoral arteries are suitable for access. This could be confirmed with catheter arteriography if clinically appropriate. 2. Calcifications of the aortic valve, compatible with the reported clinical history of severe aortic stenosis. 3. Fatty attenuation mass in the retroperitoneum immediately inferior to the pancreatic head and uncinate process measuring 3.4 x 2.4 x 3.7 cm which has slowly grown compared to remote prior examination from 2006. 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 A slow-growing low-grade retroperitoneal liposarcoma is not favored at this time, but is not entirely excluded, and attention on repeat CT of the abdomen and pelvis is suggested in 12 months to ensure stability. 4. Cardiomegaly with left atrial dilatation. 5. Additional incidental findings, as above.   Electronically Signed By: Vinnie Langton M.D. On: 06/30/2016 14:10      Ref Range & Units 3wk ago  FVC-Pre L 1.29   FVC-%Pred-Pre % 60   FVC-Post L 1.14   FVC-%Pred-Post % 53   FVC-%Change-Post % -11   FEV1-Pre L 0.79   FEV1-%Pred-Pre % 50   FEV1-Post L 0.77   FEV1-%Pred-Post % 48   FEV1-%Change-Post % -3   FEV6-Pre L 1.25   FEV6-%Pred-Pre % 62   FEV6-Post L 1.14   FEV6-%Pred-Post % 56   FEV6-%Change-Post % -8   Pre FEV1/FVC ratio % 61   FEV1FVC-%Pred-Pre % 84   Post FEV1/FVC ratio % 67   FEV1FVC-%Change-Post % 8   Pre FEV6/FVC Ratio % 97   FEV6FVC-%Pred-Pre % 102   Post FEV6/FVC ratio % 99   FEV6FVC-%Pred-Post % 105   FEV6FVC-%Change-Post % 2   FEF 25-75 Pre L/sec 0.34   FEF2575-%Pred-Pre % 31   FEF 25-75 Post L/sec 0.30   FEF2575-%Pred-Post % 27   FEF2575-%Change-Post % -12   RV L 2.67   RV % pred % 116   TLC L 4.18   TLC %  pred % 90        STS RISK CALCULATOR: Procedure: AV Replacement  Risk of Mortality: 2.603%  Morbidity or Mortality: 14.554%  Long Length of Stay: 6.916%  Short Length of Stay: 30.416%  Permanent Stroke: 1.551%  Prolonged Ventilation: 9.594%  DSW Infection: 0.254%  Renal Failure: 2.846%  Reoperation: 6.462%   Impression:  This 83 year old woman has stage D, severe, symptomatic aortic stenosis with NYHA class II symptoms of exertional fatigue and shortness of breath consistent with chronic diastolic heart failure. I have personally reviewed her echo, cath and CTA studies. She has a trileaflet, moderately calcified and thickened valve with restricted mobility and a mean gradient of 37 mm Hg with a dimensionless index of 0.25, indexed valve area of 0.45 cm2/m2. LV function is normal and there was minimal non-obstructive disease on cath 1 year ago. Her recent ETT was negative for ischemia and I don't think she needs another cath. I think AVR is indicated in this patient for relief of her heart failure symptoms and to prevent LV deterioration. 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 I think TAVR is the best treatment for her with lower operative risk and a much quicker recovery.  Her gated cardiac CT shows anatomy favorable for a 26 mm Sapien 3 valve and her pelvic arterial anatomy is suitable for transfemoral insertion.  The patient and her daughter were counseled at length regarding treatment alternatives for management of severe symptomatic aortic stenosis. The risks and benefits of surgical intervention has been discussed in detail. Long-term prognosis with medical therapy was discussed. Alternative approaches such as conventional surgical aortic valve replacement, transcatheter aortic valve replacement, and palliative medical therapy  were compared and contrasted at length. This discussion was placed in the context of the patient's own specific clinical presentation and past medical history. All of their questions been addressed. The patient is eager to proceed with surgical management as soon as possible.   Following the decision to proceed with transcatheter aortic valve replacement, a discussion was held regarding what types of management strategies would be attempted intraoperatively in the event of life-threatening complications, including whether or not the patient would be considered a candidate for the use of cardiopulmonary bypass and/or conversion to open sternotomy for attempted surgical intervention.   The patient has been advised of a variety of complications that might develop including but not limited to risks of death, stroke, paravalvular leak, aortic dissection or other major vascular complications, aortic annulus rupture, device embolization, cardiac rupture or perforation, mitral regurgitation, acute myocardial infarction, arrhythmia, heart block or bradycardia requiring permanent pacemaker placement, congestive heart failure, respiratory failure, renal failure, pneumonia, infection, other late complications related to structural valve deterioration or migration, or other complications that might ultimately cause a temporary or permanent loss of functional independence or other long term morbidity. The patient provides full informed consent for the procedure as described and all questions were answered.     Plan:  Transfemoral TAVR     Gaye Pollack, MD

## 2016-07-27 ENCOUNTER — Inpatient Hospital Stay (HOSPITAL_COMMUNITY): Payer: Medicare Other | Admitting: Vascular Surgery

## 2016-07-27 ENCOUNTER — Inpatient Hospital Stay (HOSPITAL_COMMUNITY)
Admission: RE | Admit: 2016-07-27 | Discharge: 2016-07-29 | DRG: 267 | Disposition: A | Discharge status: Home / Self Care | Payer: Medicare Other | Source: Ambulatory Visit | Attending: Cardiovascular Disease | Admitting: Cardiovascular Disease

## 2016-07-27 ENCOUNTER — Inpatient Hospital Stay (HOSPITAL_COMMUNITY): Payer: Medicare Other

## 2016-07-27 ENCOUNTER — Encounter (HOSPITAL_COMMUNITY): Payer: Self-pay | Admitting: Anesthesiology

## 2016-07-27 ENCOUNTER — Inpatient Hospital Stay (HOSPITAL_COMMUNITY): Payer: Medicare Other | Admitting: Anesthesiology

## 2016-07-27 ENCOUNTER — Encounter (HOSPITAL_COMMUNITY)
Admission: RE | Disposition: A | Discharge status: Home / Self Care | Payer: Self-pay | Source: Ambulatory Visit | Attending: Cardiovascular Disease

## 2016-07-27 DIAGNOSIS — I5032 Chronic diastolic (congestive) heart failure: Secondary | ICD-10-CM | POA: Diagnosis present

## 2016-07-27 DIAGNOSIS — Z96651 Presence of right artificial knee joint: Secondary | ICD-10-CM | POA: Diagnosis present

## 2016-07-27 DIAGNOSIS — K279 Peptic ulcer, site unspecified, unspecified as acute or chronic, without hemorrhage or perforation: Secondary | ICD-10-CM | POA: Diagnosis present

## 2016-07-27 DIAGNOSIS — I350 Nonrheumatic aortic (valve) stenosis: Principal | ICD-10-CM

## 2016-07-27 DIAGNOSIS — E039 Hypothyroidism, unspecified: Secondary | ICD-10-CM | POA: Diagnosis not present

## 2016-07-27 DIAGNOSIS — E7800 Pure hypercholesterolemia, unspecified: Secondary | ICD-10-CM | POA: Diagnosis not present

## 2016-07-27 DIAGNOSIS — Z96643 Presence of artificial hip joint, bilateral: Secondary | ICD-10-CM | POA: Diagnosis not present

## 2016-07-27 DIAGNOSIS — Z923 Personal history of irradiation: Secondary | ICD-10-CM | POA: Diagnosis not present

## 2016-07-27 DIAGNOSIS — I2510 Atherosclerotic heart disease of native coronary artery without angina pectoris: Secondary | ICD-10-CM | POA: Diagnosis not present

## 2016-07-27 DIAGNOSIS — Z952 Presence of prosthetic heart valve: Secondary | ICD-10-CM | POA: Diagnosis not present

## 2016-07-27 DIAGNOSIS — Z79899 Other long term (current) drug therapy: Secondary | ICD-10-CM

## 2016-07-27 DIAGNOSIS — I471 Supraventricular tachycardia, unspecified: Secondary | ICD-10-CM | POA: Diagnosis present

## 2016-07-27 DIAGNOSIS — J9811 Atelectasis: Secondary | ICD-10-CM | POA: Diagnosis not present

## 2016-07-27 DIAGNOSIS — Z9221 Personal history of antineoplastic chemotherapy: Secondary | ICD-10-CM | POA: Diagnosis not present

## 2016-07-27 DIAGNOSIS — Z9049 Acquired absence of other specified parts of digestive tract: Secondary | ICD-10-CM | POA: Diagnosis not present

## 2016-07-27 DIAGNOSIS — Z7982 Long term (current) use of aspirin: Secondary | ICD-10-CM

## 2016-07-27 DIAGNOSIS — I700 Atherosclerosis of aorta: Secondary | ICD-10-CM | POA: Diagnosis present

## 2016-07-27 DIAGNOSIS — C50911 Malignant neoplasm of unspecified site of right female breast: Secondary | ICD-10-CM | POA: Diagnosis present

## 2016-07-27 DIAGNOSIS — Z8249 Family history of ischemic heart disease and other diseases of the circulatory system: Secondary | ICD-10-CM | POA: Diagnosis not present

## 2016-07-27 DIAGNOSIS — Z006 Encounter for examination for normal comparison and control in clinical research program: Secondary | ICD-10-CM

## 2016-07-27 DIAGNOSIS — Z954 Presence of other heart-valve replacement: Secondary | ICD-10-CM | POA: Diagnosis not present

## 2016-07-27 DIAGNOSIS — Z8711 Personal history of peptic ulcer disease: Secondary | ICD-10-CM | POA: Diagnosis not present

## 2016-07-27 DIAGNOSIS — N183 Chronic kidney disease, stage 3 (moderate): Secondary | ICD-10-CM | POA: Diagnosis not present

## 2016-07-27 DIAGNOSIS — Z6833 Body mass index (BMI) 33.0-33.9, adult: Secondary | ICD-10-CM | POA: Diagnosis not present

## 2016-07-27 DIAGNOSIS — I340 Nonrheumatic mitral (valve) insufficiency: Secondary | ICD-10-CM | POA: Diagnosis not present

## 2016-07-27 DIAGNOSIS — I00 Rheumatic fever without heart involvement: Secondary | ICD-10-CM | POA: Diagnosis present

## 2016-07-27 DIAGNOSIS — Z888 Allergy status to other drugs, medicaments and biological substances status: Secondary | ICD-10-CM | POA: Diagnosis not present

## 2016-07-27 DIAGNOSIS — J811 Chronic pulmonary edema: Secondary | ICD-10-CM | POA: Diagnosis not present

## 2016-07-27 DIAGNOSIS — E669 Obesity, unspecified: Secondary | ICD-10-CM | POA: Diagnosis not present

## 2016-07-27 DIAGNOSIS — Z853 Personal history of malignant neoplasm of breast: Secondary | ICD-10-CM

## 2016-07-27 HISTORY — DX: Presence of prosthetic heart valve: Z95.2

## 2016-07-27 HISTORY — DX: Malignant neoplasm of unspecified site of unspecified female breast: C50.919

## 2016-07-27 HISTORY — PX: TRANSCATHETER AORTIC VALVE REPLACEMENT, TRANSFEMORAL: SHX6400

## 2016-07-27 HISTORY — PX: TEE WITHOUT CARDIOVERSION: SHX5443

## 2016-07-27 HISTORY — DX: Nonrheumatic aortic (valve) stenosis: I35.0

## 2016-07-27 LAB — POCT I-STAT, CHEM 8
BUN: 19 mg/dL (ref 6–20)
BUN: 19 mg/dL (ref 6–20)
BUN: 19 mg/dL (ref 6–20)
CALCIUM ION: 1.2 mmol/L (ref 1.15–1.40)
CALCIUM ION: 1.15 mmol/L (ref 1.15–1.40)
CHLORIDE: 98 mmol/L — AB (ref 101–111)
CHLORIDE: 99 mmol/L — AB (ref 101–111)
CREATININE: 0.9 mg/dL (ref 0.44–1.00)
CREATININE: 0.9 mg/dL (ref 0.44–1.00)
Calcium, Ion: 1.12 mmol/L — ABNORMAL LOW (ref 1.15–1.40)
Chloride: 98 mmol/L — ABNORMAL LOW (ref 101–111)
Creatinine, Ser: 0.9 mg/dL (ref 0.44–1.00)
GLUCOSE: 135 mg/dL — AB (ref 65–99)
GLUCOSE: 137 mg/dL — AB (ref 65–99)
Glucose, Bld: 136 mg/dL — ABNORMAL HIGH (ref 65–99)
HCT: 39 % (ref 36.0–46.0)
HCT: 39 % (ref 36.0–46.0)
HCT: 37 % (ref 36.0–46.0)
HEMOGLOBIN: 13.3 g/dL (ref 12.0–15.0)
HEMOGLOBIN: 13.3 g/dL (ref 12.0–15.0)
HEMOGLOBIN: 12.6 g/dL (ref 12.0–15.0)
POTASSIUM: 4 mmol/L (ref 3.5–5.1)
Potassium: 3.8 mmol/L (ref 3.5–5.1)
Potassium: 3.9 mmol/L (ref 3.5–5.1)
SODIUM: 140 mmol/L (ref 135–145)
Sodium: 139 mmol/L (ref 135–145)
Sodium: 138 mmol/L (ref 135–145)
TCO2: 27 mmol/L (ref 0–100)
TCO2: 28 mmol/L (ref 0–100)
TCO2: 27 mmol/L (ref 0–100)

## 2016-07-27 LAB — POCT I-STAT 3, ART BLOOD GAS (G3+)
ACID-BASE EXCESS: 1 mmol/L (ref 0.0–2.0)
BICARBONATE: 27.3 mmol/L (ref 20.0–28.0)
Bicarbonate: 28.6 mmol/L — ABNORMAL HIGH (ref 20.0–28.0)
O2 Saturation: 96 %
O2 Saturation: 96 %
PCO2 ART: 61.9 mmHg — AB (ref 32.0–48.0)
PH ART: 7.343 — AB (ref 7.350–7.450)
TCO2: 29 mmol/L (ref 0–100)
TCO2: 31 mmol/L (ref 0–100)
pCO2 arterial: 50.2 mmHg — ABNORMAL HIGH (ref 32.0–48.0)
pH, Arterial: 7.273 — ABNORMAL LOW (ref 7.350–7.450)
pO2, Arterial: 88 mmHg (ref 83.0–108.0)
pO2, Arterial: 93 mmHg (ref 83.0–108.0)

## 2016-07-27 LAB — PROTIME-INR
INR: 1.13
Prothrombin Time: 14.6 seconds (ref 11.4–15.2)

## 2016-07-27 LAB — CBC
HEMATOCRIT: 36.6 % (ref 36.0–46.0)
HEMOGLOBIN: 12.1 g/dL (ref 12.0–15.0)
MCH: 31.1 pg (ref 26.0–34.0)
MCHC: 33.1 g/dL (ref 30.0–36.0)
MCV: 94.1 fL (ref 78.0–100.0)
Platelets: 211 10*3/uL (ref 150–400)
RBC: 3.89 MIL/uL (ref 3.87–5.11)
RDW: 13.4 % (ref 11.5–15.5)
WBC: 4.5 10*3/uL (ref 4.0–10.5)

## 2016-07-27 LAB — POCT I-STAT 4, (NA,K, GLUC, HGB,HCT)
GLUCOSE: 121 mg/dL — AB (ref 65–99)
HCT: 36 % (ref 36.0–46.0)
Hemoglobin: 12.2 g/dL (ref 12.0–15.0)
Potassium: 4.1 mmol/L (ref 3.5–5.1)
Sodium: 139 mmol/L (ref 135–145)

## 2016-07-27 LAB — APTT: APTT: 34 s (ref 24–36)

## 2016-07-27 SURGERY — IMPLANTATION, AORTIC VALVE, TRANSCATHETER, FEMORAL APPROACH
Anesthesia: Monitor Anesthesia Care

## 2016-07-27 MED ORDER — ACETAMINOPHEN 160 MG/5ML PO SOLN: 1000.0000 mg | Freq: Four times a day (QID) | ORAL | Status: DC

## 2016-07-27 MED ORDER — TRAMADOL HCL 50 MG PO TABS: 50.0000 mg | ORAL_TABLET | ORAL | Status: DC | PRN

## 2016-07-27 MED ORDER — PHENYLEPHRINE 40 MCG/ML (10ML) SYRINGE FOR IV PUSH (FOR BLOOD PRESSURE SUPPORT)
PREFILLED_SYRINGE | INTRAVENOUS | Status: AC
  Filled 2016-07-27: qty 10

## 2016-07-27 MED ORDER — LIDOCAINE HCL 1 % IJ SOLN
INTRAMUSCULAR | Status: DC | PRN
  Administered 2016-07-27: 24 mL

## 2016-07-27 MED ORDER — METOPROLOL TARTRATE 25 MG/10 ML ORAL SUSPENSION
12.5000 mg | Freq: Two times a day (BID) | ORAL | Status: DC
  Administered 2016-07-29: 12.5 mg
  Filled 2016-07-27: qty 5

## 2016-07-27 MED ORDER — CHLORHEXIDINE GLUCONATE 4 % EX LIQD: 60.0000 mL | Freq: Once | CUTANEOUS | Status: DC

## 2016-07-27 MED ORDER — PANTOPRAZOLE SODIUM 40 MG PO TBEC: 40.0000 mg | DELAYED_RELEASE_TABLET | Freq: Every day | ORAL | Status: DC

## 2016-07-27 MED ORDER — CLOPIDOGREL BISULFATE 75 MG PO TABS
75.0000 mg | ORAL_TABLET | Freq: Every day | ORAL | Status: DC
  Administered 2016-07-28 – 2016-07-29 (×2): 75 mg via ORAL
  Filled 2016-07-27 (×2): qty 1

## 2016-07-27 MED ORDER — ONDANSETRON HCL 4 MG/2ML IJ SOLN
INTRAMUSCULAR | Status: AC
  Filled 2016-07-27: qty 2

## 2016-07-27 MED ORDER — MORPHINE SULFATE (PF) 4 MG/ML IV SOLN: 2.0000 mg | INTRAVENOUS | Status: DC | PRN

## 2016-07-27 MED ORDER — CHLORHEXIDINE GLUCONATE 0.12 % MT SOLN
OROMUCOSAL | Status: AC
  Administered 2016-07-27: 15 mL via OROMUCOSAL
  Filled 2016-07-27: qty 15

## 2016-07-27 MED ORDER — FUROSEMIDE 20 MG PO TABS: 20.0000 mg | ORAL_TABLET | Freq: Every day | ORAL | Status: DC

## 2016-07-27 MED ORDER — ASPIRIN EC 81 MG PO TBEC
81.0000 mg | DELAYED_RELEASE_TABLET | Freq: Every day | ORAL | Status: DC
  Administered 2016-07-28: 81 mg via ORAL
  Filled 2016-07-27: qty 1

## 2016-07-27 MED ORDER — SODIUM CHLORIDE 0.9 % IV SOLN
0.1000 ug/kg/h | INTRAVENOUS | Status: DC
  Filled 2016-07-27: qty 2

## 2016-07-27 MED ORDER — PROPOFOL 10 MG/ML IV BOLUS
INTRAVENOUS | Status: AC
  Filled 2016-07-27: qty 20

## 2016-07-27 MED ORDER — HEPARIN SODIUM (PORCINE) 1000 UNIT/ML IJ SOLN
INTRAMUSCULAR | Status: AC
  Filled 2016-07-27: qty 1

## 2016-07-27 MED ORDER — LACTATED RINGERS IV SOLN
INTRAVENOUS | Status: DC | PRN
  Administered 2016-07-27: 07:00:00 via INTRAVENOUS

## 2016-07-27 MED ORDER — ONDANSETRON HCL 4 MG/2ML IJ SOLN: 4.0000 mg | Freq: Four times a day (QID) | INTRAMUSCULAR | Status: DC | PRN

## 2016-07-27 MED ORDER — ACETAMINOPHEN 650 MG RE SUPP: 650.0000 mg | Freq: Once | RECTAL | Status: AC

## 2016-07-27 MED ORDER — FENTANYL CITRATE (PF) 250 MCG/5ML IJ SOLN
INTRAMUSCULAR | Status: AC
  Filled 2016-07-27: qty 5

## 2016-07-27 MED ORDER — PANTOPRAZOLE SODIUM 40 MG PO TBEC
40.0000 mg | DELAYED_RELEASE_TABLET | Freq: Every day | ORAL | Status: DC
  Administered 2016-07-28 – 2016-07-29 (×2): 40 mg via ORAL
  Filled 2016-07-27 (×2): qty 1

## 2016-07-27 MED ORDER — REMIFENTANIL HCL 1 MG IV SOLR
INTRAVENOUS | Status: DC | PRN
  Administered 2016-07-27: .05 ug/kg/min via INTRAVENOUS

## 2016-07-27 MED ORDER — ALBUMIN HUMAN 5 % IV SOLN
INTRAVENOUS | Status: DC | PRN
  Administered 2016-07-27: 08:00:00 via INTRAVENOUS

## 2016-07-27 MED ORDER — EPHEDRINE SULFATE 50 MG/ML IJ SOLN
INTRAMUSCULAR | Status: DC | PRN
  Administered 2016-07-27: 10 mg via INTRAVENOUS

## 2016-07-27 MED ORDER — MORPHINE SULFATE (PF) 4 MG/ML IV SOLN: 1.0000 mg | INTRAVENOUS | Status: AC | PRN

## 2016-07-27 MED ORDER — OXYCODONE HCL 5 MG PO TABS
5.0000 mg | ORAL_TABLET | ORAL | Status: DC | PRN
  Administered 2016-07-29: 5 mg via ORAL
  Filled 2016-07-27: qty 1

## 2016-07-27 MED ORDER — IODIXANOL 320 MG/ML IV SOLN
INTRAVENOUS | Status: DC | PRN
  Administered 2016-07-27: 43.4 mL via INTRA_ARTERIAL

## 2016-07-27 MED ORDER — PROTAMINE SULFATE 10 MG/ML IV SOLN
INTRAVENOUS | Status: DC | PRN
  Administered 2016-07-27: 80 mg via INTRAVENOUS

## 2016-07-27 MED ORDER — ACETAMINOPHEN 160 MG/5ML PO SOLN: 650.0000 mg | Freq: Once | ORAL | Status: AC

## 2016-07-27 MED ORDER — HEPARIN SODIUM (PORCINE) 5000 UNIT/ML IJ SOLN
INTRAMUSCULAR | Status: DC | PRN
  Administered 2016-07-27: 09:00:00 500 mL

## 2016-07-27 MED ORDER — NOREPINEPHRINE BITARTRATE 1 MG/ML IV SOLN
INTRAVENOUS | Status: DC | PRN
  Administered 2016-07-27: 2 ug/min via INTRAVENOUS

## 2016-07-27 MED ORDER — METOPROLOL TARTRATE 12.5 MG HALF TABLET
12.5000 mg | ORAL_TABLET | Freq: Two times a day (BID) | ORAL | Status: DC
  Filled 2016-07-27: qty 1

## 2016-07-27 MED ORDER — COQ10 200 MG PO CAPS: 200.0000 mg | ORAL_CAPSULE | Freq: Every day | ORAL | Status: DC

## 2016-07-27 MED ORDER — SODIUM CHLORIDE 0.9 % IV SOLN
0.0125 ug/kg/min | Freq: Once | INTRAVENOUS | Status: DC
  Filled 2016-07-27: qty 2000

## 2016-07-27 MED ORDER — SODIUM CHLORIDE 0.9 % IV SOLN: INTRAVENOUS | Status: DC

## 2016-07-27 MED ORDER — SODIUM CHLORIDE 0.9 % IV SOLN: INTRAVENOUS | Status: AC

## 2016-07-27 MED ORDER — 0.9 % SODIUM CHLORIDE (POUR BTL) OPTIME
TOPICAL | Status: DC | PRN
  Administered 2016-07-27: 5000 mL

## 2016-07-27 MED ORDER — CHLORHEXIDINE GLUCONATE 0.12 % MT SOLN
15.0000 mL | OROMUCOSAL | Status: AC
  Administered 2016-07-27: 15 mL via OROMUCOSAL
  Filled 2016-07-27: qty 15

## 2016-07-27 MED ORDER — ADULT MULTIVITAMIN W/MINERALS CH
1.0000 | ORAL_TABLET | Freq: Every day | ORAL | Status: DC
  Administered 2016-07-28: 1 via ORAL
  Filled 2016-07-27: qty 1

## 2016-07-27 MED ORDER — LIDOCAINE 2% (20 MG/ML) 5 ML SYRINGE
INTRAMUSCULAR | Status: AC
  Filled 2016-07-27: qty 5

## 2016-07-27 MED ORDER — MIDAZOLAM HCL 2 MG/2ML IJ SOLN: 2.0000 mg | INTRAMUSCULAR | Status: DC | PRN

## 2016-07-27 MED ORDER — MAGNESIUM GLUCONATE 500 MG PO TABS
500.0000 mg | ORAL_TABLET | Freq: Two times a day (BID) | ORAL | Status: DC
  Administered 2016-07-28 – 2016-07-29 (×3): 500 mg via ORAL
  Filled 2016-07-27 (×3): qty 1

## 2016-07-27 MED ORDER — VITAMIN B-12 1000 MCG PO TABS
1000.0000 ug | ORAL_TABLET | Freq: Every day | ORAL | Status: DC
  Administered 2016-07-28 – 2016-07-29 (×2): 1000 ug via ORAL
  Filled 2016-07-27 (×2): qty 1

## 2016-07-27 MED ORDER — VITAMIN C 500 MG PO TABS
500.0000 mg | ORAL_TABLET | Freq: Every day | ORAL | Status: DC
  Administered 2016-07-28 – 2016-07-29 (×2): 500 mg via ORAL
  Filled 2016-07-27 (×2): qty 1

## 2016-07-27 MED ORDER — CHLORHEXIDINE GLUCONATE 0.12 % MT SOLN
15.0000 mL | Freq: Once | OROMUCOSAL | Status: AC
  Administered 2016-07-27: 15 mL via OROMUCOSAL

## 2016-07-27 MED ORDER — HEPARIN SODIUM (PORCINE) 1000 UNIT/ML IJ SOLN
INTRAMUSCULAR | Status: DC | PRN
  Administered 2016-07-27: 8000 [IU] via INTRAVENOUS

## 2016-07-27 MED ORDER — POTASSIUM CHLORIDE CRYS ER 10 MEQ PO TBCR
10.0000 meq | EXTENDED_RELEASE_TABLET | Freq: Every day | ORAL | Status: DC
  Administered 2016-07-28 – 2016-07-29 (×2): 10 meq via ORAL
  Filled 2016-07-27 (×2): qty 1

## 2016-07-27 MED ORDER — DEXMEDETOMIDINE HCL 200 MCG/2ML IV SOLN
INTRAVENOUS | Status: DC | PRN
  Administered 2016-07-27: 40 ug via INTRAVENOUS

## 2016-07-27 MED ORDER — CHLORHEXIDINE GLUCONATE 4 % EX LIQD: 30.0000 mL | CUTANEOUS | Status: DC

## 2016-07-27 MED ORDER — LEVOTHYROXINE SODIUM 112 MCG PO TABS
112.0000 ug | ORAL_TABLET | Freq: Every day | ORAL | Status: DC
  Administered 2016-07-28 – 2016-07-29 (×2): 112 ug via ORAL
  Filled 2016-07-27 (×2): qty 1

## 2016-07-27 MED ORDER — VANCOMYCIN HCL IN DEXTROSE 1-5 GM/200ML-% IV SOLN
1000.0000 mg | Freq: Once | INTRAVENOUS | Status: AC
  Administered 2016-07-27: 1000 mg via INTRAVENOUS
  Filled 2016-07-27: qty 200

## 2016-07-27 MED ORDER — ONDANSETRON HCL 4 MG/2ML IJ SOLN
INTRAMUSCULAR | Status: DC | PRN
  Administered 2016-07-27: 4 mg via INTRAVENOUS

## 2016-07-27 MED ORDER — METOPROLOL TARTRATE 5 MG/5ML IV SOLN: 2.5000 mg | INTRAVENOUS | Status: DC | PRN

## 2016-07-27 MED ORDER — DEXTROSE 5 % IV SOLN
1.5000 g | Freq: Two times a day (BID) | INTRAVENOUS | Status: AC
  Administered 2016-07-27 – 2016-07-29 (×4): 1.5 g via INTRAVENOUS
  Filled 2016-07-27 (×4): qty 1.5

## 2016-07-27 MED ORDER — FENTANYL CITRATE (PF) 100 MCG/2ML IJ SOLN
INTRAMUSCULAR | Status: DC | PRN
  Administered 2016-07-27 (×2): 50 ug via INTRAVENOUS

## 2016-07-27 MED ORDER — LIDOCAINE HCL (PF) 1 % IJ SOLN
INTRAMUSCULAR | Status: AC
  Filled 2016-07-27: qty 30

## 2016-07-27 MED ORDER — MORPHINE SULFATE (PF) 2 MG/ML IV SOLN: 1.0000 mg | INTRAVENOUS | Status: DC | PRN

## 2016-07-27 MED ORDER — DEXMEDETOMIDINE HCL IN NACL 400 MCG/100ML IV SOLN
INTRAVENOUS | Status: DC | PRN
  Administered 2016-07-27: .5 ug/kg/h via INTRAVENOUS

## 2016-07-27 MED ORDER — ACETAMINOPHEN 500 MG PO TABS
1000.0000 mg | ORAL_TABLET | Freq: Four times a day (QID) | ORAL | Status: DC
  Administered 2016-07-27 – 2016-07-29 (×7): 1000 mg via ORAL
  Filled 2016-07-27 (×6): qty 2

## 2016-07-27 MED ORDER — FAMOTIDINE IN NACL 20-0.9 MG/50ML-% IV SOLN
20.0000 mg | Freq: Two times a day (BID) | INTRAVENOUS | Status: AC
  Administered 2016-07-27 (×2): 20 mg via INTRAVENOUS
  Filled 2016-07-27 (×2): qty 50

## 2016-07-27 MED ORDER — MORPHINE SULFATE (PF) 2 MG/ML IV SOLN: 2.0000 mg | INTRAVENOUS | Status: DC | PRN

## 2016-07-27 SURGICAL SUPPLY — 98 items
ADAPTER UNIV SWAN GANZ BIP (ADAPTER) ×1 IMPLANT
ADAPTER UNV SWAN GANZ BIP (ADAPTER) ×2
ADH SKN CLS APL DERMABOND .7 (GAUZE/BANDAGES/DRESSINGS) ×1
ADPR CATH UNV NS SG CATH (ADAPTER) ×1
BAG BANDED W/RUBBER/TAPE 36X54 (MISCELLANEOUS) ×3 IMPLANT
BAG DECANTER FOR FLEXI CONT (MISCELLANEOUS) ×2 IMPLANT
BAG EQP BAND 135X91 W/RBR TAPE (MISCELLANEOUS) ×1
BAG SNAP BAND KOVER 36X36 (MISCELLANEOUS) ×6 IMPLANT
BLADE CLIPPER SURG (BLADE) ×2 IMPLANT
BLADE OSCILLATING /SAGITTAL (BLADE) IMPLANT
BLADE STERNUM SYSTEM 6 (BLADE) ×3 IMPLANT
CABLE ADAPT CONN TEMP 6FT (ADAPTER) ×3 IMPLANT
CABLE PACING FASLOC BIEGE (MISCELLANEOUS) ×3 IMPLANT
CABLE PACING FASLOC BLUE (MISCELLANEOUS) ×3 IMPLANT
CANNULA FEM VENOUS REMOTE 22FR (CANNULA) IMPLANT
CANNULA OPTISITE PERFUSION 16F (CANNULA) IMPLANT
CANNULA OPTISITE PERFUSION 18F (CANNULA) IMPLANT
CATH DIAG EXPO 6F VENT PIG 145 (CATHETERS) ×6 IMPLANT
CATH EXPO 5FR AL1 (CATHETERS) ×3 IMPLANT
CATH S G BIP PACING (SET/KITS/TRAYS/PACK) ×6 IMPLANT
CLIP VESOCCLUDE MED 24/CT (CLIP) ×3 IMPLANT
CLIP VESOCCLUDE SM WIDE 24/CT (CLIP) ×3 IMPLANT
CONT SPEC 4OZ CLIKSEAL STRL BL (MISCELLANEOUS) ×6 IMPLANT
COVER BACK TABLE 24X17X13 BIG (DRAPES) ×3 IMPLANT
COVER BACK TABLE 60X90IN (DRAPES) ×3 IMPLANT
COVER BACK TABLE 80X110 HD (DRAPES) ×3 IMPLANT
COVER DOME SNAP 22 D (MISCELLANEOUS) ×3 IMPLANT
COVER MAYO STAND STRL (DRAPES) ×3 IMPLANT
CRADLE DONUT ADULT HEAD (MISCELLANEOUS) ×3 IMPLANT
DERMABOND ADVANCED (GAUZE/BANDAGES/DRESSINGS) ×2
DERMABOND ADVANCED .7 DNX12 (GAUZE/BANDAGES/DRESSINGS) ×1 IMPLANT
DEVICE CLOSURE PERCLS PRGLD 6F (VASCULAR PRODUCTS) IMPLANT
DRAPE INCISE IOBAN 66X45 STRL (DRAPES) IMPLANT
DRAPE SLUSH MACHINE 52X66 (DRAPES) ×3 IMPLANT
DRSG TEGADERM 4X4.75 (GAUZE/BANDAGES/DRESSINGS) ×5 IMPLANT
ELECT REM PT RETURN 9FT ADLT (ELECTROSURGICAL) ×6
ELECTRODE REM PT RTRN 9FT ADLT (ELECTROSURGICAL) ×2 IMPLANT
FELT TEFLON 6X6 (MISCELLANEOUS) ×3 IMPLANT
FEMORAL VENOUS CANN RAP (CANNULA) IMPLANT
GAUZE SPONGE 4X4 12PLY STRL (GAUZE/BANDAGES/DRESSINGS) ×3 IMPLANT
GAUZE SPONGE 4X4 12PLY STRL LF (GAUZE/BANDAGES/DRESSINGS) ×4 IMPLANT
GLOVE BIO SURGEON STRL SZ8 (GLOVE) ×6 IMPLANT
GLOVE EUDERMIC 7 POWDERFREE (GLOVE) ×6 IMPLANT
GLOVE ORTHO TXT STRL SZ7.5 (GLOVE) ×6 IMPLANT
GOWN STRL REUS W/ TWL LRG LVL3 (GOWN DISPOSABLE) ×3 IMPLANT
GOWN STRL REUS W/ TWL XL LVL3 (GOWN DISPOSABLE) ×6 IMPLANT
GOWN STRL REUS W/TWL LRG LVL3 (GOWN DISPOSABLE) ×9
GOWN STRL REUS W/TWL XL LVL3 (GOWN DISPOSABLE) ×18
GUIDEWIRE SAF TJ AMPL .035X180 (WIRE) ×3 IMPLANT
GUIDEWIRE SAFE TJ AMPLATZ EXST (WIRE) ×3 IMPLANT
GUIDEWIRE STRAIGHT .035 260CM (WIRE) ×3 IMPLANT
INSERT FOGARTY 61MM (MISCELLANEOUS) ×3 IMPLANT
INSERT FOGARTY SM (MISCELLANEOUS) IMPLANT
INSERT FOGARTY XLG (MISCELLANEOUS) IMPLANT
KIT BASIN OR (CUSTOM PROCEDURE TRAY) ×3 IMPLANT
KIT DILATOR VASC 18G NDL (KITS) IMPLANT
KIT HEART LEFT (KITS) ×3 IMPLANT
KIT ROOM TURNOVER OR (KITS) ×3 IMPLANT
KIT SUCTION CATH 14FR (SUCTIONS) ×6 IMPLANT
NDL PERC 18GX7CM (NEEDLE) ×1 IMPLANT
NEEDLE PERC 18GX7CM (NEEDLE) ×3 IMPLANT
NS IRRIG 1000ML POUR BTL (IV SOLUTION) ×9 IMPLANT
PACK AORTA (CUSTOM PROCEDURE TRAY) ×3 IMPLANT
PAD ARMBOARD 7.5X6 YLW CONV (MISCELLANEOUS) ×6 IMPLANT
PAD ELECT DEFIB RADIOL ZOLL (MISCELLANEOUS) ×3 IMPLANT
PERCLOSE PROGLIDE 6F (VASCULAR PRODUCTS) ×6
SET MICROPUNCTURE 5F STIFF (MISCELLANEOUS) ×3 IMPLANT
SHEATH AVANTI 11CM 8FR (MISCELLANEOUS) ×3 IMPLANT
SHEATH PINNACLE 6F 10CM (SHEATH) ×6 IMPLANT
SLEEVE REPOSITIONING LENGTH 30 (MISCELLANEOUS) ×3 IMPLANT
SPONGE LAP 4X18 X RAY DECT (DISPOSABLE) ×3 IMPLANT
STOPCOCK MORSE 400PSI 3WAY (MISCELLANEOUS) ×18 IMPLANT
SUT ETHIBOND X763 2 0 SH 1 (SUTURE) IMPLANT
SUT GORETEX CV 4 TH 22 36 (SUTURE) IMPLANT
SUT GORETEX CV4 TH-18 (SUTURE) IMPLANT
SUT GORETEX TH-18 36 INCH (SUTURE) IMPLANT
SUT PROLENE 3 0 SH1 36 (SUTURE) IMPLANT
SUT PROLENE 4 0 RB 1 (SUTURE)
SUT PROLENE 4-0 RB1 .5 CRCL 36 (SUTURE) IMPLANT
SUT PROLENE 5 0 C 1 36 (SUTURE) IMPLANT
SUT PROLENE 6 0 C 1 30 (SUTURE) IMPLANT
SUT SILK  1 MH (SUTURE) ×2
SUT SILK 1 MH (SUTURE) ×1 IMPLANT
SUT SILK 2 0 SH CR/8 (SUTURE) IMPLANT
SUT VIC AB 2-0 CT1 27 (SUTURE)
SUT VIC AB 2-0 CT1 TAPERPNT 27 (SUTURE) IMPLANT
SUT VIC AB 2-0 CTX 36 (SUTURE) IMPLANT
SUT VIC AB 3-0 SH 8-18 (SUTURE) IMPLANT
SYR 10ML LL (SYRINGE) ×9 IMPLANT
SYR 30ML LL (SYRINGE) ×6 IMPLANT
SYR 50ML LL SCALE MARK (SYRINGE) ×3 IMPLANT
TOWEL OR 17X26 10 PK STRL BLUE (TOWEL DISPOSABLE) ×6 IMPLANT
TRANSDUCER W/STOPCOCK (MISCELLANEOUS) ×6 IMPLANT
TRAY FOLEY SILVER 16FR TEMP (SET/KITS/TRAYS/PACK) ×3 IMPLANT
TUBING HIGH PRESSURE 120CM (CONNECTOR) ×3 IMPLANT
VALVE HEART TRANSCATH SZ3 26MM (Prosthesis & Implant Heart) ×2 IMPLANT
WIRE AMPLATZ SS-J .035X180CM (WIRE) ×3 IMPLANT
WIRE BENTSON .035X145CM (WIRE) ×3 IMPLANT

## 2016-07-27 NOTE — Anesthesia Postprocedure Evaluation (Signed)
Anesthesia Post Note  Patient: [name]  Procedure(s) Performed: Procedure(s) (LRB): TRANSCATHETER AORTIC VALVE REPLACEMENT, TRANSFEMORAL (N/A) TRANSESOPHAGEAL ECHOCARDIOGRAM (TEE) (N/A)     Patient location during evaluation: PACU Anesthesia Type: MAC Level of consciousness: awake Pain management: pain level controlled Vital Signs Assessment: post-procedure vital signs reviewed and stable Respiratory status: spontaneous breathing Cardiovascular status: stable Anesthetic complications: no    Last Vitals:  Vitals:   07/27/16 1500 07/27/16 1552  BP: (!) 91/45 (!) 91/45  Pulse:    Resp: 18 15  Temp:  36.6 C    Last Pain:  Vitals:   07/27/16 1552  TempSrc: Oral                 Dynisha Due

## 2016-07-27 NOTE — CV Procedure (Signed)
[facility]  TAVR OPERATIVE NOTE   Date of Procedure:  07/27/2016  Preoperative Diagnosis: Severe Aortic Stenosis   Postoperative Diagnosis: Same   Procedure:    Transcatheter Aortic Valve Replacement - Transfemoral Approach  Edwards Sapien 3 THV (size 26 mm, model # [id number], serial # [id number])   Co-Surgeons:  Gilford Raid, MD and Lauree Chandler, MD  Anesthesiologist:  Nyoka Cowden  Echocardiographer:  Aundra Dubin  Pre-operative Echo Findings:  Severe aortic stenosis  Normal left ventricular systolic function  Post-operative Echo Findings:  No paravalvular leak  Normal left ventricular systolic function  BRIEF CLINICAL NOTE AND INDICATIONS FOR SURGERY  Patient is an 83 year old female with history of rheumatic fever during childhood, aortic stenosis, chronic kidney disease, hypercholesterolemia, hypothyroidism, breast cancer status post lumpectomy followed by chemotherapy and radiation therapy, degenerative arthritis status post bilateral total hip replacement and right total knee replacement who is here today for TAVR. She has had progressive aortic stenosis with dyspnea and fatigue. She has been followed for the last several years by Dr. Radford Pax. Echocardiograms have demonstrated the presence of aortic stenosis that has gradually progressed. In 2017 the patient began to experience symptoms of exertional shortness of breath and fatigue. She underwent nuclear stress test at that time that was felt to be moderate risk for ischemia. Diagnostic cardiac catheterization revealed mild nonobstructive coronary artery disease. Echocardiogram revealed normal left ventricular systolic function and moderate aortic stenosis. Over the past year symptoms of exertional shortness of breath that progressed. Recent follow-up echocardiogram performed 05/11/2016 revealed severe aortic stenosis with peak velocity across the aortic valve measured greater than 4.1 m/s corresponding to mean  transvalvular gradient estimated 37 mmHg. Exercise treadmill test revealed no ischemia but moderately impaired exercise capacity. The patient was referred to Dr. Burt Knack for consultation and subsequently underwent CT angiography to evaluate the feasibility of transcatheter aortic valve replacement.  During the course of the patient's preoperative work up they have been evaluated comprehensively by a multidisciplinary team of specialists coordinated through the [facility] the [facility].  They have been demonstrated to suffer from symptomatic severe aortic stenosis as noted above. The patient has been counseled extensively as to the relative risks and benefits of all options for the treatment of severe aortic stenosis including long term medical therapy, conventional surgery for aortic valve replacement, and transcatheter aortic valve replacement.  The patient has been independently evaluated by two cardiac surgeons including Dr Roxy Manns and Dr. Cyndia Bent, and they are felt to be at high risk for conventional surgical aortic valve replacement. Both surgeons indicated the patient would be a poor candidate for conventional surgery. Based upon review of all of the patient's preoperative diagnostic tests they are felt to be candidate for transcatheter aortic valve replacement using the transfemoral approach as an alternative to high risk conventional surgery.    Following the decision to proceed with transcatheter aortic valve replacement, a discussion has been held regarding what types of management strategies would be attempted intraoperatively in the event of life-threatening complications, including whether or not the patient would be considered a candidate for the use of cardiopulmonary bypass and/or conversion to open sternotomy for attempted surgical intervention.  The patient has been advised of a variety of complications that might develop peculiar to this  approach including but not limited to risks of death, stroke, paravalvular leak, aortic dissection or other major vascular complications, aortic annulus rupture, device embolization, cardiac rupture or perforation, acute myocardial infarction, arrhythmia, heart  block or bradycardia requiring permanent pacemaker placement, congestive heart failure, respiratory failure, renal failure, pneumonia, infection, other late complications related to structural valve deterioration or migration, or other complications that might ultimately cause a temporary or permanent loss of functional independence or other long term morbidity.  The patient provides full informed consent for the procedure as described and all questions were answered preoperatively.    DETAILS OF THE OPERATIVE PROCEDURE  PREPARATION:   The patient is brought to the operating room on the above mentioned date and central monitoring was established by the anesthesia team with placement of a radial arterial line. The patient is placed in the supine position on the operating table.  Intravenous antibiotics are administered. Conscious sedation is administered. A Foley catheter is placed.  Baseline transthoracic echocardiogram was performed. The patient's chest, abdomen, both groins, and both lower extremities are prepared and draped in a sterile manner. A time out procedure is performed.   PERIPHERAL ACCESS:   Using the modified Seldinger technique, femoral arterial and venous access were obtained with placement of 6 Fr sheaths on the left side.  A pigtail diagnostic catheter was passed through the femoral arterial sheath under fluoroscopic guidance into the aortic root.  A temporary transvenous pacemaker catheter was passed through the femoral venous sheath under fluoroscopic guidance into the right ventricle.  The pacemaker was tested to ensure stable lead placement and pacemaker capture. Aortic root angiography was performed in order to determine  the optimal angiographic angle for valve deployment.  TRANSFEMORAL ACCESS:  A micropuncture kit was used to gain access into the right femoral artery. Pre-closure with double ProGlide closure devices. The patient was heparinized systemically and ACT verified > 250 seconds.    A 14 Fr transfemoral E-sheath was introduced into the right femoral artery after progressively dilating over an Amplatz superstiff wire. An AL-1 catheter was used to direct a straight-tip exchange length wire across the native aortic valve into the left ventricle. This was exchanged out for a pigtail catheter and position was confirmed in the LV apex. Simultaneous LV and Ao pressures were recorded.  The pigtail catheter was then exchanged for an Amplatz Extra-stiff wire in the LV apex.   TRANSCATHETER HEART VALVE DEPLOYMENT:  An Edwards Sapien 3 THV (size 26 mm) was prepared and crimped per manufacturer's guidelines, and the proper orientation of the valve is confirmed on the [employer] delivery system. The valve was advanced through the introducer sheath using normal technique until in an appropriate position in the abdominal aorta beyond the sheath tip. The balloon was then retracted and using the fine-tuning wheel was centered on the valve. The valve was then advanced across the aortic arch using appropriate flexion of the catheter. The valve was carefully positioned across the aortic valve annulus. The Commander catheter was retracted using normal technique. Once final position of the valve has been confirmed by angiographic assessment, the valve is deployed while temporarily holding ventilation and during rapid ventricular pacing to maintain systolic blood pressure < 50 mmHg and pulse pressure < 10 mmHg. The balloon inflation is held for >3 seconds after reaching full deployment volume. Once the balloon has fully deflated the balloon is retracted into the ascending aorta and valve function is assessed using TEE. There is  felt to be no paravalvular leak and no central aortic insufficiency.  The patient's hemodynamic recovery following valve deployment is good.  The deployment balloon and guidewire are both removed. Echo demostrated acceptable post-procedural gradients, stable mitral valve function, and  no AI.   PROCEDURE COMPLETION:  The sheath was then removed and the closure device was completed. Protamine was administered once femoral arterial repair was complete. The temporary pacemaker, pigtail catheters and femoral sheaths were removed with manual pressure used for hemostasis.   The patient tolerated the procedure well and is transported to the surgical intensive care in stable condition. There were no immediate intraoperative complications. All sponge instrument and needle counts are verified correct at completion of the operation.   No blood products were administered during the operation.  The patient received a total of 43.4 mL of intravenous contrast during the procedure.  Lauree Chandler MD 07/27/2016 8:29 AM

## 2016-07-27 NOTE — Progress Notes (Signed)
  Echocardiogram 2D Echocardiogram has been performed.  [name] 07/27/2016, 10:22 AM

## 2016-07-27 NOTE — Anesthesia Preprocedure Evaluation (Signed)
Anesthesia Evaluation  Patient identified by MRN, date of birth, ID band Patient awake    Reviewed: Allergy & Precautions, NPO status , Patient's Chart, lab work & pertinent test results  Airway Mallampati: II  TM Distance: >3 FB     Dental   Pulmonary shortness of breath,    breath sounds clear to auscultation       Cardiovascular + CAD  + dysrhythmias  Rhythm:Regular Rate:Normal     Neuro/Psych    GI/Hepatic   Endo/Other  Hypothyroidism   Renal/GU Renal disease     Musculoskeletal  (+) Arthritis ,   Abdominal   Peds  Hematology  (+) anemia ,   Anesthesia Other Findings   Reproductive/Obstetrics                             Anesthesia Physical Anesthesia Plan  ASA: III  Anesthesia Plan: General   Post-op Pain Management:    Induction: Intravenous  PONV Risk Score and Plan: 3 and Ondansetron, Dexamethasone, Propofol and Midazolam  Airway Management Planned: Oral ETT  Additional Equipment:   Intra-op Plan:   Post-operative Plan: Possible Post-op intubation/ventilation  Informed Consent:   Dental advisory given  Plan Discussed with: CRNA and Anesthesiologist  Anesthesia Plan Comments:         Anesthesia Quick Evaluation

## 2016-07-27 NOTE — Progress Notes (Signed)
Patient ID: [name], female   DOB: 1931/10/25, 83 y.o.   MRN: [id number]  SICU Evening Rounds:   Hemodynamically stable    Awake and alert   Urine output good   Groin access sites look good Feet pink and warm  CBC    Component Value Date/Time   WBC 4.5 07/27/2016 1010   RBC 3.89 07/27/2016 1010   HGB 12.2 07/27/2016 1024   HGB 14.3 01/19/2010 1355   HCT 36.0 07/27/2016 1024   HCT 42.8 01/19/2010 1355   PLT 211 07/27/2016 1010   PLT 235 01/19/2010 1355   MCV 94.1 07/27/2016 1010   MCV 93.1 01/19/2010 1355   MCH 31.1 07/27/2016 1010   MCHC 33.1 07/27/2016 1010   RDW 13.4 07/27/2016 1010   RDW 13.6 01/19/2010 1355   LYMPHSABS 1,400 07/17/2015 0941   LYMPHSABS 1.4 01/19/2010 1355   MONOABS 450 07/17/2015 0941   MONOABS 0.3 01/19/2010 1355   EOSABS 300 07/17/2015 0941   EOSABS 0.1 01/19/2010 1355   BASOSABS 0 07/17/2015 0941   BASOSABS 0.0 01/19/2010 1355     BMET    Component Value Date/Time   NA 139 07/27/2016 1024   NA 137 06/21/2016 1344   K 4.1 07/27/2016 1024   CL 99 (L) 07/27/2016 0941   CO2 25 07/21/2016 1353   GLUCOSE 121 (H) 07/27/2016 1024   BUN 19 07/27/2016 0941   BUN 19 06/21/2016 1344   CREATININE 0.90 07/27/2016 0941   CREATININE 1.00 (H) 07/17/2015 0941   CALCIUM 9.8 07/21/2016 1353   GFRNONAA 56 (L) 07/21/2016 1353   GFRAA >60 07/21/2016 1353     A/P:  Stable postop course. Continue current plans

## 2016-07-27 NOTE — Transfer of Care (Signed)
Immediate Anesthesia Transfer of Care Note  Patient: [name]  Procedure(s) Performed: Procedure(s): TRANSCATHETER AORTIC VALVE REPLACEMENT, TRANSFEMORAL (N/A) TRANSESOPHAGEAL ECHOCARDIOGRAM (TEE) (N/A)  Patient Location: SICU  Anesthesia Type:MAC  Level of Consciousness: awake, alert , oriented and patient cooperative  Airway & Oxygen Therapy: Patient Spontanous Breathing and Patient connected to nasal cannula oxygen  Post-op Assessment: Report given to RN, Post -op Vital signs reviewed and stable and Patient moving all extremities  Post vital signs: Reviewed and stable  Last Vitals:  Vitals:   07/27/16 0545  BP: (!) 130/47  Pulse: 65  Resp: 18  Temp: 36.4 C    Last Pain:  Vitals:   07/27/16 0545  TempSrc: Oral         Complications: No apparent anesthesia complications

## 2016-07-27 NOTE — Anesthesia Procedure Notes (Signed)
Arterial Line Insertion Start/End7/24/2018 7:10 AM, 07/27/2016 7:20 AM Performed by: Kerby Less, CRNA  Patient location: Pre-op. Preanesthetic checklist: patient identified, IV checked, surgical consent, monitors and equipment checked, pre-op evaluation and timeout performed radial was placed Catheter size: 20 G Hand hygiene performed , maximum sterile barriers used  and Seldinger technique used Allen's test indicative of satisfactory collateral circulation Attempts: 1 Procedure performed without using ultrasound guided technique. Following insertion, Biopatch and dressing applied. Post procedure assessment: normal  Patient tolerated the procedure well with no immediate complications.

## 2016-07-27 NOTE — Interval H&P Note (Signed)
History and Physical Interval Note:  07/27/2016 5:38 AM  [name]  has presented today for surgery, with the diagnosis of SEVERE AS  The various methods of treatment have been discussed with the patient and family. After consideration of risks, benefits and other options for treatment, the patient has consented to  Procedure(s): TRANSCATHETER AORTIC VALVE REPLACEMENT, TRANSFEMORAL (N/A) TRANSESOPHAGEAL ECHOCARDIOGRAM (TEE) (N/A) as a surgical intervention .  The patient's history has been reviewed, patient examined, no change in status, stable for surgery.  I have reviewed the patient's chart and labs.  Questions were answered to the patient's satisfaction.     Gaye Pollack

## 2016-07-27 NOTE — Care Management Note (Addendum)
Case Management Note  Patient Details  Name: [name] MRN: [id number] Date of Birth: 1931/06/25  Subjective/Objective:   From home, s/p TAVR.  She will have her daughter [name] and her sister [name] who is a Therapist, sports , with her at discharge. She is ambulating in hall with RN, she has PCP , Dr. Marisue Humble, she has medication coverage.  She ahs walker at home but does not use it. She has a walk in shower with shower chair.                  Action/Plan: NCM will follow for dc needs.   Expected Discharge Date:                  Expected Discharge Plan:     In-House Referral:     Discharge planning Services  CM Consult  Post Acute Care Choice:    Choice offered to:     DME Arranged:    DME Agency:     HH Arranged:    HH Agency:     Status of Service:  In process, will continue to follow  If discussed at Long Length of Stay Meetings, dates discussed:    Additional Comments:  [name], RN 07/27/2016, 5:25 PM

## 2016-07-27 NOTE — Anesthesia Procedure Notes (Signed)
Central Venous Catheter Insertion Performed by: Roberts Gaudy, anesthesiologist Start/End7/24/2018 7:50 AM, 07/27/2016 7:55 AM Patient location: OR. Preanesthetic checklist: patient identified, IV checked, site marked, risks and benefits discussed, surgical consent, monitors and equipment checked, pre-op evaluation and timeout performed Position: supine Lidocaine 1% used for infiltration Hand hygiene performed , maximum sterile barriers used  and Seldinger technique used Catheter size: 8 Fr Total catheter length 17. Central line was placed.Double lumen Ultrasound Notes:anatomy identified, needle tip was noted to be adjacent to the nerve/plexus identified and no ultrasound evidence of intravascular and/or intraneural injection Attempts: 1 Following insertion, line sutured, dressing applied and Biopatch. Post procedure assessment: blood return through all ports, free fluid flow and no air  Patient tolerated the procedure well with no immediate complications.

## 2016-07-28 ENCOUNTER — Inpatient Hospital Stay (HOSPITAL_COMMUNITY): Payer: Medicare Other

## 2016-07-28 ENCOUNTER — Other Ambulatory Visit: Payer: Self-pay | Admitting: *Deleted

## 2016-07-28 ENCOUNTER — Encounter (HOSPITAL_COMMUNITY): Payer: Self-pay | Admitting: Cardiovascular Disease

## 2016-07-28 DIAGNOSIS — Z952 Presence of prosthetic heart valve: Secondary | ICD-10-CM

## 2016-07-28 DIAGNOSIS — Z954 Presence of other heart-valve replacement: Secondary | ICD-10-CM

## 2016-07-28 DIAGNOSIS — I350 Nonrheumatic aortic (valve) stenosis: Secondary | ICD-10-CM

## 2016-07-28 DIAGNOSIS — I340 Nonrheumatic mitral (valve) insufficiency: Secondary | ICD-10-CM

## 2016-07-28 LAB — ECHOCARDIOGRAM COMPLETE
AO mean calculated velocity dopler: 129 cm/s
AOASC: 30 cm
AOPV: 0.53 m/s
AOVTI: 45.5 cm
AV Area VTI index: 0.61 cm2/m2
AV Area mean vel: 1.24 cm2
AV VEL mean LVOT/AV: 0.54
AV pk vel: 198 cm/s
AVAREAMEANVIN: 0.63 cm2/m2
AVAREAVTI: 1.19 cm2
AVG: 8 mmHg
AVPG: 16 mmHg
CHL CUP AV PEAK INDEX: 0.61
CHL CUP AV VALUE AREA INDEX: 0.61
CHL CUP AV VEL: 1.2
CHL CUP DOP CALC LVOT VTI: 24.1 cm
E decel time: 370 msec
E/e' ratio: 21.55
FS: 39 % (ref 28–44)
HEIGHTINCHES: 62 in
IV/PV OW: 1.61
LA ID, A-P, ES: 43 mm
LA diam end sys: 43 mm
LA vol A4C: 70 ml
LA vol index: 38.8 mL/m2
LADIAMINDEX: 2.2 cm/m2
LAVOL: 75.7 mL
LDCA: 2.27 cm2
LVEEAVG: 21.55
LVEEMED: 21.55
LVELAT: 6.45 cm/s
LVOT SV: 55 mL
LVOT diameter: 17 mm
LVOT peak VTI: 0.53 cm
LVOTPV: 104 cm/s
Lateral S' vel: 12.2 cm/s
MV Dec: 370
MV Peak grad: 8 mmHg
MVPKAVEL: 102 m/s
MVPKEVEL: 139 m/s
PW: 9.17 mm — AB (ref 0.6–1.1)
RV TAPSE: 21.4 mm
TDI e' lateral: 6.45
TDI e' medial: 5.4
Valve area: 1.2 cm2
WEIGHTICAEL: 2960 [oz_av]

## 2016-07-28 LAB — POCT I-STAT, CHEM 8
BUN: 15 mg/dL (ref 6–20)
BUN: 15 mg/dL (ref 6–20)
BUN: 9 mg/dL (ref 6–20)
CALCIUM ION: 1.15 mmol/L (ref 1.15–1.40)
CHLORIDE: 97 mmol/L — AB (ref 101–111)
Calcium, Ion: 1.24 mmol/L (ref 1.15–1.40)
Calcium, Ion: 1.28 mmol/L (ref 1.15–1.40)
Chloride: 97 mmol/L — ABNORMAL LOW (ref 101–111)
Chloride: 106 mmol/L (ref 101–111)
Creatinine, Ser: 0.7 mg/dL (ref 0.44–1.00)
Creatinine, Ser: 0.6 mg/dL (ref 0.44–1.00)
Creatinine, Ser: 0.6 mg/dL (ref 0.44–1.00)
GLUCOSE: 93 mg/dL (ref 65–99)
Glucose, Bld: 220 mg/dL — ABNORMAL HIGH (ref 65–99)
Glucose, Bld: 238 mg/dL — ABNORMAL HIGH (ref 65–99)
HEMATOCRIT: 32 % — AB (ref 36.0–46.0)
HEMATOCRIT: 31 % — AB (ref 36.0–46.0)
HEMATOCRIT: 28 % — AB (ref 36.0–46.0)
HEMOGLOBIN: 10.5 g/dL — AB (ref 12.0–15.0)
HEMOGLOBIN: 9.5 g/dL — AB (ref 12.0–15.0)
Hemoglobin: 10.9 g/dL — ABNORMAL LOW (ref 12.0–15.0)
POTASSIUM: 4.3 mmol/L (ref 3.5–5.1)
POTASSIUM: 3.6 mmol/L (ref 3.5–5.1)
Potassium: 4.4 mmol/L (ref 3.5–5.1)
SODIUM: 135 mmol/L (ref 135–145)
SODIUM: 137 mmol/L (ref 135–145)
SODIUM: 142 mmol/L (ref 135–145)
TCO2: 26 mmol/L (ref 0–100)
TCO2: 27 mmol/L (ref 0–100)
TCO2: 24 mmol/L (ref 0–100)

## 2016-07-28 LAB — CBC
HEMATOCRIT: 36.8 % (ref 36.0–46.0)
HEMOGLOBIN: 12 g/dL (ref 12.0–15.0)
MCH: 30.7 pg (ref 26.0–34.0)
MCHC: 32.6 g/dL (ref 30.0–36.0)
MCV: 94.1 fL (ref 78.0–100.0)
Platelets: 184 10*3/uL (ref 150–400)
RBC: 3.91 MIL/uL (ref 3.87–5.11)
RDW: 13.2 % (ref 11.5–15.5)
WBC: 5.2 10*3/uL (ref 4.0–10.5)

## 2016-07-28 LAB — POCT I-STAT 3, ART BLOOD GAS (G3+)
Bicarbonate: 26.3 mmol/L (ref 20.0–28.0)
O2 SAT: 92 %
TCO2: 28 mmol/L (ref 0–100)
pCO2 arterial: 50.8 mmHg — ABNORMAL HIGH (ref 32.0–48.0)
pH, Arterial: 7.321 — ABNORMAL LOW (ref 7.350–7.450)
pO2, Arterial: 68 mmHg — ABNORMAL LOW (ref 83.0–108.0)

## 2016-07-28 LAB — GLUCOSE, CAPILLARY: GLUCOSE-CAPILLARY: 103 mg/dL — AB (ref 65–99)

## 2016-07-28 LAB — BASIC METABOLIC PANEL
ANION GAP: 6 (ref 5–15)
BUN: 15 mg/dL (ref 6–20)
CALCIUM: 8.9 mg/dL (ref 8.9–10.3)
CHLORIDE: 102 mmol/L (ref 101–111)
CO2: 30 mmol/L (ref 22–32)
Creatinine, Ser: 0.93 mg/dL (ref 0.44–1.00)
GFR calc Af Amer: >60 mL/min (ref >60)
GFR calc non Af Amer: 56 mL/min — ABNORMAL LOW (ref >60)
Glucose, Bld: 95 mg/dL (ref 65–99)
POTASSIUM: 4 mmol/L (ref 3.5–5.1)
Sodium: 138 mmol/L (ref 135–145)

## 2016-07-28 LAB — MAGNESIUM: Magnesium: 2.1 mg/dL (ref 1.7–2.4)

## 2016-07-28 MED ORDER — PERFLUTREN LIPID MICROSPHERE
1.0000 mL | INTRAVENOUS | Status: AC | PRN
  Administered 2016-07-28: 2 mL via INTRAVENOUS
  Filled 2016-07-28: qty 10

## 2016-07-28 MED ORDER — FUROSEMIDE 10 MG/ML IJ SOLN
40.0000 mg | Freq: Once | INTRAMUSCULAR | Status: AC
  Administered 2016-07-28: 40 mg via INTRAVENOUS
  Filled 2016-07-28: qty 4

## 2016-07-28 MED FILL — Sodium Chloride IV Soln 0.9%: INTRAVENOUS | Qty: 100 | Status: AC

## 2016-07-28 MED FILL — Sodium Chloride IV Soln 0.9%: INTRAVENOUS | Qty: 250 | Status: AC

## 2016-07-28 MED FILL — Potassium Chloride Inj 2 mEq/ML: INTRAVENOUS | Qty: 40 | Status: AC

## 2016-07-28 MED FILL — Dexmedetomidine HCl in NaCl 0.9% IV Soln 400 MCG/100ML: INTRAVENOUS | Qty: 100 | Status: AC

## 2016-07-28 MED FILL — Insulin Regular (Human) Inj 100 Unit/ML: INTRAMUSCULAR | Qty: 1 | Status: AC

## 2016-07-28 MED FILL — Heparin Sodium (Porcine) Inj 1000 Unit/ML: INTRAMUSCULAR | Qty: 30 | Status: AC

## 2016-07-28 MED FILL — Phenylephrine HCl Inj 10 MG/ML: INTRAMUSCULAR | Qty: 2 | Status: AC

## 2016-07-28 MED FILL — Magnesium Sulfate Inj 50%: INTRAMUSCULAR | Qty: 10 | Status: AC

## 2016-07-28 NOTE — Op Note (Signed)
[facility]   MULTIDISCIPLINARY HEART VALVE TEAM   TAVR OPERATIVE NOTE   Date of Procedure:  07/27/2016  Preoperative Diagnosis: Severe Aortic Stenosis   Postoperative Diagnosis: Same   Procedure:    Transcatheter Aortic Valve Replacement - Percutaneous Right Transfemoral Approach  Edwards Sapien 3 THV (size 26 mm, model # 9600TFX, serial # [id number])   Co-Surgeons:  Gaye Pollack, MD and Lauree Chandler, MD    Anesthesiologist:  Belinda Block, MD  Echocardiographer:  Loralie Champagne, MD  Pre-operative Echo Findings:  Severe aortic stenosis  Normal left ventricular systolic function  Post-operative Echo Findings:  NO paravalvular leak  Normal left ventricular systolic function   BRIEF CLINICAL NOTE AND INDICATIONS FOR SURGERY  The patient is an 83 year old woman with a hx of chronic kidney disease, hypercholesterolemia, hypothyroidism, history of right breast cancer s/p lumpectomy followed by chemo/XRT, 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 An echo in 05/2015 showed moderate AS with a mean gradient of 31 mm Hg and normal LV function. She was having some exertional fatigue at that time. She underwent a nuclear stress test on 07/14/2015 that showed ischemia in the mid and basal inferolateral walls. She says that she got very short of breath with the stress test. She then had a cardiac cath on 07/17/2016 showing mild non-obstructive disease in the mid LAD. The peak to peak AV gradient at that time was 20 mm Hg and the mean was 23.9 mm Hg. Since then she has had progressive shortness of breath and fatigue with exertion. She has no chest pain or dizziness. She has had frequently coughing. Her most recent echo on 05/11/2016 showed an LVEF of 55-60% with a mean AV gradient of 37 mm Hg and a peak of 69 mm Hg. She had an ETT performed by Dr. Radford Pax on 05/20/2016 which showed no ischemia  with moderately impaired exercise capacity. She was referred to Dr. Burt Knack and underwent work up for TAVR.  She has stage D, severe, symptomatic aortic stenosis with NYHA class II symptoms of exertional fatigue and shortness of breath consistent with chronic diastolic heart failure. I have personally reviewed her echo, cath and CTA studies. She has a trileaflet, moderately calcified and thickened valve with restricted mobility and a mean gradient of 37 mm Hg with a dimensionless index of 0.25, indexed valve area of 0.45 cm2/m2. LV function is normal and there was minimal non-obstructive disease on cath 1 year ago. Her recent ETT was negative for ischemia and I don't think she needs another cath. I think AVR is indicated in this patient for relief of her heart failure symptoms and to prevent LV deterioration. 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 I think TAVR is the best treatment for her with lower operative risk and a much quicker recovery. Her gated cardiac CT shows anatomy favorable for a 26 mm Sapien 3 valve and her pelvic arterial anatomy is suitable for transfemoral insertion.  The patient and her daughter were counseled at length regarding treatment alternatives for management of severe symptomatic aortic stenosis. The risks and benefits of surgical intervention has been discussed in detail. Long-term prognosis with medical therapy was discussed. Alternative approaches such as conventional surgical aortic valve replacement, transcatheter aortic valve replacement, and palliative  medical therapy were compared and contrasted at length. This discussion was placed in the context of the patient's own specific clinical presentation and past medical history. All of their questions been addressed. The patient is eager to proceed with surgical  management as soon as possible.   Following the decision to proceed with transcatheter aortic valve replacement, a discussion was held regarding what types of management strategies would be attempted intraoperatively in the event of life-threatening complications, including whether or not the patient would be considered a candidate for the use of cardiopulmonary bypass and/or conversion to open sternotomy for attempted surgical intervention.   The patient has been advised of a variety of complications that might develop including but not limited to risks of death, stroke, paravalvular leak, aortic dissection or other major vascular complications, aortic annulus rupture, device embolization, cardiac rupture or perforation, mitral regurgitation, acute myocardial infarction, arrhythmia, heart block or bradycardia requiring permanent pacemaker placement, congestive heart failure, respiratory failure, renal failure, pneumonia, infection, other late complications related to structural valve deterioration or migration, or other complications that might ultimately cause a temporary or permanent loss of functional independence or other long term morbidity. The patient provides full informed consent for the procedure as described and all questions were answered.    DETAILS OF THE OPERATIVE PROCEDURE  PREPARATION:    The patient is brought to the operating room on the above mentioned date and central monitoring was established by the anesthesia team including placement of a central venous line and radial arterial line. The patient is placed in the supine position on the operating table.  Intravenous antibiotics are administered. The patient is monitored closely throughout the procedure under conscious sedation.  A Foley catheter is placed due to the patient's concern about urinary frequency and incontinence.  Baseline transthoracic echocardiogram was performed. The patient's chest, abdomen, both groins, and  both lower extremities are prepared and draped in a sterile manner. A time out procedure is performed.   PERIPHERAL ACCESS:    Using the modified Seldinger technique, femoral arterial and venous access was obtained with placement of 6 Fr sheaths on the left side.  A pigtail diagnostic catheter was passed through the left arterial sheath under fluoroscopic guidance into the aortic root.  A temporary transvenous pacemaker catheter was passed through the left femoral venous sheath under fluoroscopic guidance into the right ventricle.  The pacemaker was tested to ensure stable lead placement and pacemaker capture. Aortic root angiography was performed in order to determine the optimal angiographic angle for valve deployment.   TRANSFEMORAL ACCESS:   Percutaneous transfemoral access and sheath placement was performed by Dr. Angelena Form using ultrasound guidance.  The right common femoral artery was cannulated using a micropuncture needle and appropriate location was verified using hand injection angiogram.  A pair of Abbott Perclose percutaneous closure devices were placed and a 6 French sheath replaced into the femoral artery.  The patient was heparinized systemically and ACT verified > 250 seconds.    A 14 Fr transfemoral E-sheath was introduced into the right femoral artery after progressively dilating over an Amplatz superstiff wire. An AL-1 catheter was used to direct a straight-tip exchange length wire across the native aortic valve into the left ventricle. This was exchanged out for a pigtail catheter and position was confirmed in the LV apex. Simultaneous LV and Ao pressures were recorded.  The pigtail catheter was exchanged for an Amplatz Extra-stiff wire in the LV apex.  Echocardiography was utilized to confirm appropriate wire position and  no sign of entanglement in the mitral subvalvular apparatus.   BALLOON AORTIC VALVULOPLASTY:   Not performed  TRANSCATHETER HEART VALVE DEPLOYMENT:   An  Edwards Sapien 3 transcatheter heart valve (size 26 mm, model #9600TFX, serial #[id number]) was prepared and crimped per manufacturer's guidelines, and the proper orientation of the valve is confirmed on the [employer] delivery system. The valve was advanced through the introducer sheath using normal technique until in an appropriate position in the abdominal aorta beyond the sheath tip. The balloon was then retracted and using the fine-tuning wheel was centered on the valve. The valve was then advanced across the aortic arch using appropriate flexion of the catheter. The valve was carefully positioned across the aortic valve annulus. The Commander catheter was retracted using normal technique. Once final position of the valve has been confirmed by angiographic assessment, the valve is deployed while temporarily holding ventilation and during rapid ventricular pacing to maintain systolic blood pressure < 50 mmHg and pulse pressure < 10 mmHg. The balloon inflation is held for >3 seconds after reaching full deployment volume. Once the balloon has fully deflated the balloon is retracted into the ascending aorta and valve function is assessed using echocardiography. There is felt to be no paravalvular leak and no central aortic insufficiency.  The patient's hemodynamic recovery following valve deployment is good.  The deployment balloon and guidewire are both removed.    PROCEDURE COMPLETION:   The sheath was removed and femoral artery closure performed by Dr Angelena Form.  Protamine was administered once femoral arterial repair was complete. The temporary pacemaker, pigtail catheters and femoral sheaths were removed with manual pressure used for hemostasis.   The patient tolerated the procedure well and is transported to the surgical intensive care in stable condition. There were no immediate intraoperative complications. All sponge instrument and needle counts are verified correct at completion of the  operation.   No blood products were administered during the operation.  The patient received a total of 43.4 mL of intravenous contrast during the procedure.   Gaye Pollack, MD 07/27/2016

## 2016-07-28 NOTE — Progress Notes (Signed)
      [address]       [place] [address]             [phone]      POD # 1 TAVR  Up in chair  No complaints  Awaiting step down bed  Remo Lipps C. Roxan Hockey, MD [facility] [phone]

## 2016-07-28 NOTE — Progress Notes (Signed)
1 Day Post-Op Procedure(s) (LRB): TRANSCATHETER AORTIC VALVE REPLACEMENT, TRANSFEMORAL (N/A) TRANSESOPHAGEAL ECHOCARDIOGRAM (TEE) (N/A) Subjective:  No chest pain or shortness of breath. Feels well  Objective: Vital signs in last 24 hours: Temp:  [96.1 F (35.6 C)-98.5 F (36.9 C)] 97.9 F (36.6 C) (07/25 0408) Pulse Rate:  [32-60] 55 (07/24 2138) Cardiac Rhythm: Sinus bradycardia (07/25 0800) Resp:  [13-21] 15 (07/25 0800) BP: (91-123)/(39-57) 120/49 (07/25 0800) SpO2:  [90 %-98 %] 95 % (07/25 0800) Arterial Line BP: (96-160)/(42-71) 148/53 (07/24 1800)  Hemodynamic parameters for last 24 hours:    Intake/Output from previous day: 07/24 0701 - 07/25 0700 In: 1790 [P.O.:240; I.V.:800; IV Piggyback:350] Out: 1300 [Urine:1250; Blood:50] Intake/Output this shift: Total I/O In: -  Out: 100 [Urine:100]  General appearance: alert and cooperative Neurologic: intact Heart: regular rate and rhythm, S1, S2 normal, no murmur, click, rub or gallop Lungs: clear to auscultation bilaterally Extremities: extremities normal, atraumatic, no cyanosis or edema Wound: groin access sites ok  Lab Results:  Recent Labs  07/27/16 1010 07/27/16 1024 07/28/16 0446  WBC 4.5  --  5.2  HGB 12.1 12.2 12.0  HCT 36.6 36.0 36.8  PLT 211  --  184   BMET:  Recent Labs  07/27/16 0941 07/27/16 1024 07/28/16 0446  NA 140 139 138  K 3.9 4.1 4.0  CL 99*  --  102  CO2  --   --  30  GLUCOSE 137* 121* 95  BUN 19  --  15  CREATININE 0.90  --  0.93  CALCIUM  --   --  8.9    PT/INR:  Recent Labs  07/27/16 1010  LABPROT 14.6  INR 1.13   ABG    Component Value Date/Time   PHART 7.273 (L) 07/27/2016 0945   HCO3 28.6 (H) 07/27/2016 0945   TCO2 31 07/27/2016 0945   O2SAT 96.0 07/27/2016 0945   CBG (last 3)  No results for input(s): GLUCAP in the last 72 hours.  Assessment/Plan: S/P Procedure(s) (LRB): TRANSCATHETER AORTIC VALVE REPLACEMENT, TRANSFEMORAL (N/A) TRANSESOPHAGEAL  ECHOCARDIOGRAM (TEE) (N/A)  She is doing well POD 1 after TAVR Receiving a dose of lasix this am due to congestion on CXR although she has no SOB and sounds ok 2D echo in progress  DC central line and foley  ASA and Plavix.  Transfer to 4E and plan home tomorrow.   LOS: 1 day    [name] 07/28/2016

## 2016-07-28 NOTE — Progress Notes (Signed)
Spoke to [place] RN she's going to remove the central line. [name]

## 2016-07-28 NOTE — Progress Notes (Signed)
  Echocardiogram 2D Echocardiogram has been performed.  Herbert Aguinaldo G Bernetha Anschutz 07/28/2016, 9:11 AM

## 2016-07-28 NOTE — Progress Notes (Signed)
Progress Note  Patient Name: [name] Date of Encounter: 07/28/2016  Primary Cardiologist: Radford Pax  Subjective   No chest pain or dyspnea  Inpatient Medications    Scheduled Meds: . acetaminophen  1,000 mg Oral Q6H   Or  . acetaminophen (TYLENOL) oral liquid 160 mg/5 mL  1,000 mg Per Tube Q6H  . aspirin EC  81 mg Oral Daily  . clopidogrel  75 mg Oral Q breakfast  . furosemide  20 mg Oral Daily  . levothyroxine  112 mcg Oral QAC breakfast  . magnesium gluconate  500 mg Oral BID  . metoprolol tartrate  12.5 mg Oral BID   Or  . metoprolol tartrate  12.5 mg Per Tube BID  . multivitamin with minerals  1 tablet Oral Daily  . pantoprazole  40 mg Oral Daily  . potassium chloride  10 mEq Oral Daily  . vitamin B-12  1,000 mcg Oral Daily  . vitamin C  500 mg Oral Daily   Continuous Infusions: . cefUROXime (ZINACEF)  IV Stopped (07/27/16 2059)  . dexmedetomidine (PRECEDEX) IV infusion     PRN Meds: metoprolol tartrate, midazolam, morphine injection, ondansetron (ZOFRAN) IV, oxyCODONE, traMADol   Vital Signs    Vitals:   07/28/16 0300 07/28/16 0400 07/28/16 0408 07/28/16 0500  BP: (!) 101/48 (!) 118/55  (!) 111/47  Pulse:      Resp: 14 19  15   Temp:   97.9 F (36.6 C)   TempSrc:   Oral   SpO2: 93% 92%  96%  Weight:      Height:        Intake/Output Summary (Last 24 hours) at 07/28/16 0643 Last data filed at 07/28/16 0536  Gross per 24 hour  Intake             1790 ml  Output             1300 ml  Net              490 ml   Filed Weights   07/27/16 0630  Weight: 185 lb (83.9 kg)    Telemetry    Sinus - Personally Reviewed  ECG      Physical Exam   GEN: No acute distress.   Neck: No JVD Cardiac: RRR, no murmurs, rubs, or gallops.  Respiratory: Clear to auscultation bilaterally. GI: Soft, nontender, non-distended  ext: No edema; bilateral groins without hematoma Neuro:  Nonfocal  Psych: Normal affect   Labs    Chemistry Recent Labs Lab  07/21/16 1353  07/27/16 0912 07/27/16 0941 07/27/16 1024 07/28/16 0446  NA 136  < > 138 140 139 138  K 3.9  < > 4.0 3.9 4.1 4.0  CL 102  < > 98* 99*  --  102  CO2 25  --   --   --   --  30  GLUCOSE 110*  < > 135* 137* 121* 95  BUN 19  < > 19 19  --  15  CREATININE 0.92  < > 0.90 0.90  --  0.93  CALCIUM 9.8  --   --   --   --  8.9  PROT 7.3  --   --   --   --   --   ALBUMIN 4.1  --   --   --   --   --   AST 21  --   --   --   --   --   ALT  16  --   --   --   --   --   ALKPHOS 61  --   --   --   --   --   BILITOT 0.9  --   --   --   --   --   GFRNONAA 56*  --   --   --   --  56*  GFRAA >60  --   --   --   --  >60  ANIONGAP 9  --   --   --   --  6  < > = values in this interval not displayed.   Hematology Recent Labs Lab 07/21/16 1353  07/27/16 1010 07/27/16 1024 07/28/16 0446  WBC 4.8  --  4.5  --  5.2  RBC 4.47  --  3.89  --  3.91  HGB 13.6  < > 12.1 12.2 12.0  HCT 42.2  < > 36.6 36.0 36.8  MCV 94.4  --  94.1  --  94.1  MCH 30.4  --  31.1  --  30.7  MCHC 32.2  --  33.1  --  32.6  RDW 13.5  --  13.4  --  13.2  PLT 229  --  211  --  184  < > = values in this interval not displayed.   Radiology    Dg Chest Port 1 View  Result Date: 07/27/2016 CLINICAL DATA:  Aortic valve repair . EXAM: PORTABLE CHEST 1 VIEW COMPARISON:  07/21/2016. FINDINGS: Right IJ line noted with its tip at the cavoatrial junction. Prior aortic valve repair. Cardiomegaly with mild pulmonary venous congestion. Mild interstitial prominence P No pleural effusion or pneumothorax . IMPRESSION: 1. Right IJ line noted with tip at cavoatrial junction. 2. Aortic valve repair. Cardiomegaly with mild pulmonary venous congestion and bilateral interstitial prominence suggesting mild CHF. Electronically Signed   By: Marcello Moores  Register   On: 07/27/2016 10:37    Cardiac Studies     Patient Profile     83 y.o. female with severe AS who underwent TAVR on 07/27/16.   Assessment & Plan    1. Severe aortic valve  stenosis: She is now one day post TAVR with placement of a 26 mm Sapien 3 valve from the right transfemoral approach. She is doing well this am. Mild congestion seen on chest x-ray but no gross volume overload on exam. Will give one dose of IV Lasix today. Echo today to assess valve. Continue ASA and Plavix.   Transfer to telemetry. D/c foley 6 hours after Lasix given. D/c central line  Signed, Lauree Chandler, MD  07/28/2016, 6:43 AM

## 2016-07-29 ENCOUNTER — Encounter (HOSPITAL_COMMUNITY): Payer: Self-pay | Admitting: Physician Assistant

## 2016-07-29 DIAGNOSIS — I00 Rheumatic fever without heart involvement: Secondary | ICD-10-CM | POA: Diagnosis present

## 2016-07-29 DIAGNOSIS — I350 Nonrheumatic aortic (valve) stenosis: Secondary | ICD-10-CM

## 2016-07-29 LAB — BASIC METABOLIC PANEL
Anion gap: 9 (ref 5–15)
BUN: 15 mg/dL (ref 6–20)
CALCIUM: 9.1 mg/dL (ref 8.9–10.3)
CHLORIDE: 98 mmol/L — AB (ref 101–111)
CO2: 29 mmol/L (ref 22–32)
CREATININE: 0.84 mg/dL (ref 0.44–1.00)
GFR calc non Af Amer: >60 mL/min (ref >60)
Glucose, Bld: 101 mg/dL — ABNORMAL HIGH (ref 65–99)
Potassium: 4 mmol/L (ref 3.5–5.1)
SODIUM: 136 mmol/L (ref 135–145)

## 2016-07-29 LAB — CBC
HEMATOCRIT: 39.9 % (ref 36.0–46.0)
HEMOGLOBIN: 13.2 g/dL (ref 12.0–15.0)
MCH: 30.8 pg (ref 26.0–34.0)
MCHC: 33.1 g/dL (ref 30.0–36.0)
MCV: 93.2 fL (ref 78.0–100.0)
Platelets: 182 10*3/uL (ref 150–400)
RBC: 4.28 MIL/uL (ref 3.87–5.11)
RDW: 13.1 % (ref 11.5–15.5)
WBC: 6.2 10*3/uL (ref 4.0–10.5)

## 2016-07-29 LAB — GLUCOSE, CAPILLARY: GLUCOSE-CAPILLARY: 109 mg/dL — AB (ref 65–99)

## 2016-07-29 MED ORDER — CLOPIDOGREL BISULFATE 75 MG PO TABS: 75.0000 mg | ORAL_TABLET | Freq: Every day | ORAL | 1 refills | Status: DC

## 2016-07-29 NOTE — Discharge Instructions (Signed)
ACTIVITY AND EXERCISE °• Daily activity and exercise are an important part of your recovery. People recover at different rates depending on their general health and type of valve procedure. °• Most people require six to 10 weeks to feel recovered. °• No lifting, pushing, pulling more than 10 pounds (examples to avoid: groceries, vacuuming, gardening, golfing): °            - For one week with a procedure through the groin. °            - For six weeks for procedures through the chest wall. °            - For three months for procedures through the breast-bone. °NOTE: You will typically see one of our providers 7-10 days after your procedure to discuss WHEN TO RESUME the above activities.  °  °  °DRIVING °• Do not drive for four weeks after the date of your procedure. °• If you have been told by your doctor in the past that you may not drive, you must talk with him/her before you begin driving again. °• When you resume driving, you must have someone with you. °  °  °DRESSING °• Groin site: you may leave the clear dressing over the site for up to one week or until it falls off. °  °  °HYGIENE °• If you had a femoral (leg) procedure, you may take a shower when you return home. After the shower, pat the site dry. Do NOT use powder, oils or lotions in your groin area until the site has completely healed. °• If you had a chest procedure, you may shower when you return home unless specifically instructed not to by your discharging practitioner. °            - DO NOT scrub incision; pat dry with a towel °            - DO NOT apply any lotions, oils, powders to the incision °            - No tub baths / swimming for at least six weeks. °• If you notice any fevers, chills, increased pain, swelling, bleeding or pus, please contact your doctor. °  °ADDITIONAL INFORMATION °• If you are going to have an upcoming dental procedure, please contact our office as you may require antibiotics ahead of time to prevent infection on your  heart valve.  °  °

## 2016-07-29 NOTE — Progress Notes (Addendum)
Progress Note  Patient Name: [name] Date of Encounter: 07/29/2016  Primary Cardiologist: Radford Pax  Subjective   No chest pain or dyspnea.   Inpatient Medications    Scheduled Meds: . acetaminophen  1,000 mg Oral Q6H   Or  . acetaminophen (TYLENOL) oral liquid 160 mg/5 mL  1,000 mg Per Tube Q6H  . aspirin EC  81 mg Oral Daily  . clopidogrel  75 mg Oral Q breakfast  . levothyroxine  112 mcg Oral QAC breakfast  . magnesium gluconate  500 mg Oral BID  . metoprolol tartrate  12.5 mg Per Tube BID  . multivitamin with minerals  1 tablet Oral Daily  . pantoprazole  40 mg Oral Daily  . potassium chloride  10 mEq Oral Daily  . vitamin B-12  1,000 mcg Oral Daily  . vitamin C  500 mg Oral Daily   Continuous Infusions: . cefUROXime (ZINACEF)  IV Stopped (07/28/16 2103)  . dexmedetomidine (PRECEDEX) IV infusion     PRN Meds: midazolam, morphine injection, ondansetron (ZOFRAN) IV, oxyCODONE, traMADol   Vital Signs    Vitals:   07/28/16 2339 07/29/16 0406 07/29/16 0500 07/29/16 0605  BP:    (!) 114/58  Pulse:      Resp:    14  Temp: 97.7 F (36.5 C) 97.7 F (36.5 C)    TempSrc: Oral Oral    SpO2:    91%  Weight:   185 lb 3 oz (84 kg)   Height:        Intake/Output Summary (Last 24 hours) at 07/29/16 0710 Last data filed at 07/29/16 1025  Gross per 24 hour  Intake              703 ml  Output             2855 ml  Net            -2152 ml   Filed Weights   07/27/16 0630 07/29/16 0500  Weight: 185 lb (83.9 kg) 185 lb 3 oz (84 kg)    Telemetry    Sinus brady - Personally Reviewed  ECG      Physical Exam   General: Well developed, well nourished, NAD  HEENT: OP clear, mucus membranes moist  SKIN: warm, dry. No rashes. Neuro: No focal deficits  Musculoskeletal: Muscle strength 5/5 all ext  Psychiatric: Mood and affect normal  Neck: No JVD, no carotid bruits, no thyromegaly, no lymphadenopathy.  Lungs:Clear bilaterally, no wheezes, rhonci,  crackles Cardiovascular: Regular rate and rhythm. No murmurs, gallops or rubs. Abdomen:Soft. Bowel sounds present. Non-tender.  Extremities: No lower extremity edema. Pulses are 2 + in the bilateral DP/PT.   Labs    Chemistry  Recent Labs Lab 07/27/16 1320 07/28/16 0446 07/29/16 0604  NA 142 138 136  K 3.6 4.0 4.0  CL 106 102 98*  CO2  --  30 29  GLUCOSE 93 95 101*  BUN 9 15 15   CREATININE 0.60 0.93 0.84  CALCIUM  --  8.9 9.1  GFRNONAA  --  56* >60  GFRAA  --  >60 >60  ANIONGAP  --  6 9     Hematology Recent Labs Lab 07/27/16 1010  07/27/16 1320 07/28/16 0446 07/29/16 0604  WBC 4.5  --   --  5.2 6.2  RBC 3.89  --   --  3.91 4.28  HGB 12.1  < > 9.5* 12.0 13.2  HCT 36.6  < > 28.0* 36.8 39.9  MCV 94.1  --   --  94.1 93.2  MCH 31.1  --   --  30.7 30.8  MCHC 33.1  --   --  32.6 33.1  RDW 13.4  --   --  13.2 13.1  PLT 211  --   --  184 182  < > = values in this interval not displayed.    Radiology    Dg Chest Port 1 View  Result Date: 07/28/2016 CLINICAL DATA:  Post TAVR EXAM: PORTABLE CHEST 1 VIEW COMPARISON:  07/27/2016; chest CT - 06/30/2016 FINDINGS: Grossly unchanged cardiac silhouette and mediastinal contours with atherosclerotic plaque within thoracic aorta. Stable position of support apparatus. Stable sequela of trans catheter aortic valve replacement. Mild pulmonary venous congestion without frank evidence of edema. Minimal bilateral infrahilar opacities favored to represent atelectasis. No pleural effusion or pneumothorax. No evidence of edema. No acute osseus abnormalities. IMPRESSION: 1. Similar findings of mild pulmonary venous congestion without frank evidence of edema. 2. Minimal bilateral infrahilar atelectasis. 3.  Aortic Atherosclerosis (ICD10-I70.0). Electronically Signed   By: Sandi Mariscal M.D.   On: 07/28/2016 07:43   Dg Chest Port 1 View  Result Date: 07/27/2016 CLINICAL DATA:  Aortic valve repair . EXAM: PORTABLE CHEST 1 VIEW COMPARISON:   07/21/2016. FINDINGS: Right IJ line noted with its tip at the cavoatrial junction. Prior aortic valve repair. Cardiomegaly with mild pulmonary venous congestion. Mild interstitial prominence P No pleural effusion or pneumothorax . IMPRESSION: 1. Right IJ line noted with tip at cavoatrial junction. 2. Aortic valve repair. Cardiomegaly with mild pulmonary venous congestion and bilateral interstitial prominence suggesting mild CHF. Electronically Signed   By: Marcello Moores  Register   On: 07/27/2016 10:37    Cardiac Studies   Echo 07/28/16: - Left ventricle: The cavity size was normal. There was moderate   focal basal hypertrophy. Systolic function was normal. The   estimated ejection fraction was in the range of 55% to 60%. Wall   motion was normal; there were no regional wall motion   abnormalities. Features are consistent with a pseudonormal left   ventricular filling pattern, with concomitant abnormal relaxation   and increased filling pressure (grade 2 diastolic dysfunction).   Doppler parameters are consistent with high ventricular filling   pressure. - Aortic valve: S/P TAVR. Poorly visulized bioprosthesis. There is   no apparent paravalvular leak. The mean AV gradient is 54mmHg and   the AVA is calculated at 1.2cm2. Poorly visualized. VTI ratio of   LVOT to aortic valve: 0.53. Valve area (VTI): 1.2 cm^2. Valve   area (Vmax): 1.19 cm^2. Valve area (Vmean): 1.24 cm^2. - Mitral valve: Calcified annulus. There was mild regurgitation. - Left atrium: The atrium was mildly to moderately dilated. - Pulmonic valve: Poorly visualized.  Patient Profile     83 y.o. female with severe AS who underwent TAVR on 07/27/16.    Assessment & Plan    1. Severe aortic valve stenosis: 2 days post op TAVR. She is doing well. Echo yesterday with normally functioning bioprosthetic AVR with no perivalvular leak/AI. She is on ASA and Plavix.   Will discharge home today. Follow up one week with office APP. Follow up  in one month with me with echo the day of visit with me.   Signed, Lauree Chandler, MD  07/29/2016, 7:10 AM

## 2016-07-29 NOTE — Progress Notes (Signed)
Pt has walked independently. Feels well. Ed completed with pt and daughter. Will refer to [place].  [id number] [place], ACSM 10:39 AM 07/29/2016

## 2016-07-29 NOTE — Progress Notes (Signed)
2 Days Post-Op Procedure(s) (LRB): TRANSCATHETER AORTIC VALVE REPLACEMENT, TRANSFEMORAL (N/A) TRANSESOPHAGEAL ECHOCARDIOGRAM (TEE) (N/A) Subjective:  No chest pain or shortness of breath. Feels ready to go home.  Objective: Vital signs in last 24 hours: Temp:  [97.2 F (36.2 C)-98 F (36.7 C)] 97.7 F (36.5 C) (07/26 0406) Pulse Rate:  [56] 56 (07/25 2011) Cardiac Rhythm: Normal sinus rhythm (07/25 2300) Resp:  [13-22] 14 (07/26 0605) BP: (84-187)/(36-98) 114/58 (07/26 0605) SpO2:  [91 %-96 %] 91 % (07/26 0605) Weight:  [84 kg (185 lb 3 oz)] 84 kg (185 lb 3 oz) (07/26 0500)  Hemodynamic parameters for last 24 hours:    Intake/Output from previous day: 07/25 0701 - 07/26 0700 In: 703 [P.O.:603; IV Piggyback:100] Out: 2855 [Urine:2855] Intake/Output this shift: No intake/output data recorded.  General appearance: alert and cooperative Neurologic: intact Heart: regular rate and rhythm, S1, S2 normal, no murmur, click, rub or gallop Lungs: clear to auscultation bilaterally Extremities: extremities normal, atraumatic, no cyanosis or edema Wound: groin access sites look good.  Lab Results:  Recent Labs  07/28/16 0446 07/29/16 0604  WBC 5.2 6.2  HGB 12.0 13.2  HCT 36.8 39.9  PLT 184 182   BMET:  Recent Labs  07/28/16 0446 07/29/16 0604  NA 138 136  K 4.0 4.0  CL 102 98*  CO2 30 29  GLUCOSE 95 101*  BUN 15 15  CREATININE 0.93 0.84  CALCIUM 8.9 9.1    PT/INR:  Recent Labs  07/27/16 1010  LABPROT 14.6  INR 1.13   ABG    Component Value Date/Time   PHART 7.321 (L) 07/27/2016 1056   HCO3 26.3 07/27/2016 1056   TCO2 24 07/27/2016 1320   O2SAT 92.0 07/27/2016 1056   CBG (last 3)   Recent Labs  07/28/16 2337 07/29/16 0404  GLUCAP 103* [address] [place], [place] [address]                             [phone]  ------------------------------------------------------------------- Transthoracic Echocardiography  Patient:    [name], [name] MR #:       [id number] Study Date: 07/28/2016 Gender:     F Age:        83 Height:     157.5 cm Weight:     83.9 kg BSA:        1.95 m^2 Pt. Status: Room:       [facility]   ADMITTING    Lauree Chandler  ATTENDING    Mc[place], Gilmore Laroche, Christopher  REFERRING    Lauree Chandler  PERFORMING   [facility], Inpatient  SONOGRAPHER  Dance, Tiffany  cc:  ------------------------------------------------------------------- LV EF: 55% -   60%  ------------------------------------------------------------------- Indications:      Aortic stenosis 424.1.  ------------------------------------------------------------------- History:   PMH:  Rheumatic Fever. Chronic Kidney Disease. TAVR. Dyspnea.  Coronary artery disease.  ------------------------------------------------------------------- Study Conclusions  - Left ventricle: The cavity size was normal. There was moderate  focal basal hypertrophy. Systolic function was normal. The   estimated ejection fraction was in the range of 55% to 60%. Wall   motion was normal; there were no regional wall motion   abnormalities. Features are consistent with a pseudonormal left   ventricular filling pattern, with concomitant abnormal relaxation   and increased filling pressure (grade 2 diastolic dysfunction).   Doppler parameters are consistent with high ventricular filling   pressure. - Aortic valve: S/P TAVR. Poorly visulized bioprosthesis. There is   no apparent paravalvular leak. The mean AV gradient is 24mmHg and   the AVA is calculated at 1.2cm2. Poorly visualized. VTI ratio of   LVOT to aortic valve: 0.53. Valve area (VTI): 1.2 cm^2. Valve   area (Vmax): 1.19 cm^2. Valve area (Vmean): 1.24 cm^2. - Mitral valve: Calcified annulus. There was mild  regurgitation. - Left atrium: The atrium was mildly to moderately dilated. - Pulmonic valve: Poorly visualized.  ------------------------------------------------------------------- Study data:  Comparison was made to the study of 07/27/2016.  Study status:  Routine.  Procedure:  The patient reported no pain pre or post test. Transthoracic echocardiography. Image quality was adequate.  Study completion:  There were no complications. Transthoracic echocardiography.  M-mode, complete 2D, spectral Doppler, and color Doppler.  Birthdate:  Patient birthdate: [birth year].  Age:  Patient is 83 yr old.  Sex:  Gender: female. BMI: 33.8 kg/m^2.  Blood pressure:     120/56  Patient status: Inpatient.  Study date:  Study date: 07/28/2016. Study time: 08:10 AM.  Location:  Bedside.  -------------------------------------------------------------------  ------------------------------------------------------------------- Left ventricle:  The cavity size was normal. There was moderate focal basal hypertrophy. Systolic function was normal. The estimated ejection fraction was in the range of 55% to 60%. Wall motion was normal; there were no regional wall motion abnormalities. Features are consistent with a pseudonormal left ventricular filling pattern, with concomitant abnormal relaxation and increased filling pressure (grade 2 diastolic dysfunction). Doppler parameters are consistent with high ventricular filling pressure.  ------------------------------------------------------------------- Aortic valve:  S/P TAVR. Poorly visulized bioprosthesis. There is no apparent paravalvular leak. The mean AV gradient is 90mmHg and the AVA is calculated at 1.2cm2. Poorly visualized.  Trileaflet; normal thickness leaflets. Mobility was not restricted.  Doppler: Transvalvular velocity was within the normal range. There was no stenosis. There was no regurgitation.    VTI ratio of LVOT to aortic valve: 0.53. Valve  area (VTI): 1.2 cm^2. Indexed valve area (VTI): 0.61 cm^2/m^2. Peak velocity ratio of LVOT to aortic valve: 0.53. Valve area (Vmax): 1.19 cm^2. Indexed valve area (Vmax): 0.61 cm^2/m^2. Mean velocity ratio of LVOT to aortic valve: 0.54. Valve area (Vmean): 1.24 cm^2. Indexed valve area (Vmean): 0.63 cm^2/m^2.    Mean gradient (S): 8 mm Hg. Peak gradient (S): 16 mm Hg.  ------------------------------------------------------------------- Aorta:  Aortic root: The aortic root was normal in size.  ------------------------------------------------------------------- Mitral valve:   Calcified annulus. Mobility was not restricted. Doppler:  Transvalvular velocity was within the normal range. There was no evidence for stenosis. There was mild regurgitation.    Peak gradient (D): 8 mm Hg.  ------------------------------------------------------------------- Left atrium:  The atrium was mildly to moderately dilated.   ------------------------------------------------------------------- Right ventricle:  The cavity size was normal. Wall thickness was normal. Systolic function was normal.  ------------------------------------------------------------------- Pulmonic valve:   Poorly visualized.  Structurally normal valve. Cusp separation was normal.  Doppler:  Transvalvular velocity was within the normal range. There was no evidence for stenosis. There was no regurgitation.  ------------------------------------------------------------------- Tricuspid valve:  Structurally normal valve.    Doppler: Transvalvular velocity was within the normal range. There was no regurgitation.  ------------------------------------------------------------------- Pulmonary artery:   The main pulmonary artery was normal-sized. Systolic pressure was within the normal range.  ------------------------------------------------------------------- Right atrium:  The atrium was normal in  size.  ------------------------------------------------------------------- Pericardium:  There was no pericardial effusion.  ------------------------------------------------------------------- Systemic veins: Inferior vena cava: The vessel was normal in size.  ------------------------------------------------------------------- Measurements   Left ventricle                           Value          Reference  LV ID, ED, PLAX chordal                  45    mm       43 - 52  LV ID, ES, PLAX chordal                  27.6  mm       23 - 38  LV fx shortening, PLAX chordal           39    %        >=29  LV PW thickness, ED                      9.17  mm       ----------  IVS/LV PW ratio, ED              (H)     1.61           <=1.3  Stroke volume, 2D                        55    ml       ----------  Stroke volume/bsa, 2D                    28    ml/m^2   ----------  LV e&', lateral                           6.45  cm/s     ----------  LV E/e&', lateral                         21.55          ----------  LV e&', medial                            5.4   cm/s     ----------  LV E/e&', medial                          25.74          ----------  LV e&', average                           5.93  cm/s     ----------  LV E/e&', average                         23.46          ----------    Ventricular septum  Value          Reference  IVS thickness, ED                        14.8  mm       ----------    LVOT                                     Value          Reference  LVOT ID, S                               17    mm       ----------  LVOT area                                2.27  cm^2     ----------  LVOT peak velocity, S                    104   cm/s     ----------  LVOT mean velocity, S                    70.3  cm/s     ----------  LVOT VTI, S                              24.1  cm       ----------    Aortic valve                             Value          Reference  Aortic  valve peak velocity, S            198   cm/s     ----------  Aortic valve mean velocity, S            129   cm/s     ----------  Aortic valve VTI, S                      45.5  cm       ----------  Aortic mean gradient, S                  8     mm Hg    ----------  Aortic peak gradient, S                  16    mm Hg    ----------  VTI ratio, LVOT/AV                       0.53           ----------  Aortic valve area, VTI                   1.2   cm^2     ----------  Aortic valve area/bsa, VTI               0.61  cm^2/m^2 ----------  Velocity ratio, peak, LVOT/AV  0.53           ----------  Aortic valve area, peak velocity         1.19  cm^2     ----------  Aortic valve area/bsa, peak              0.61  cm^2/m^2 ----------  velocity  Velocity ratio, mean, LVOT/AV            0.54           ----------  Aortic valve area, mean velocity         1.24  cm^2     ----------  Aortic valve area/bsa, mean              0.63  cm^2/m^2 ----------  velocity    Aorta                                    Value          Reference  Aortic root ID, ED                       31    mm       ----------  Ascending aorta ID, A-P, S               30    mm       ----------    Left atrium                              Value          Reference  LA ID, A-P, ES                           43    mm       ----------  LA ID/bsa, A-P                           2.2   cm/m^2   <=2.2  LA volume, S                             75.7  ml       ----------  LA volume/bsa, S                         38.8  ml/m^2   ----------  LA volume, ES, 1-p A4C                   70    ml       ----------  LA volume/bsa, ES, 1-p A4C               35.8  ml/m^2   ----------  LA volume, ES, 1-p A2C                   76.3  ml       ----------  LA volume/bsa, ES, 1-p A2C               39.1  ml/m^2   ----------    Mitral valve  Value          Reference  Mitral E-wave peak velocity              139   cm/s     ----------   Mitral A-wave peak velocity              102   cm/s     ----------  Mitral deceleration time         (H)     370   ms       150 - 230  Mitral peak gradient, D                  8     mm Hg    ----------  Mitral E/A ratio, peak                   1.4            ----------    Right atrium                             Value          Reference  RA ID, S-I, ES, A4C              (H)     56.2  mm       34 - 49  RA area, ES, A4C                         14.9  cm^2     8.3 - 19.5  RA volume, ES, A/L                       34.4  ml       ----------  RA volume/bsa, ES, A/L                   17.6  ml/m^2   ----------    Right ventricle                          Value          Reference  RV ID, minor axis, ED, A4C base          32.6  mm       ----------  RV ID, minor axis, ED, A4C mid           18.7  mm       ----------  RV ID, major axis, ED, A4C               62.9  mm       55 - 91  TAPSE                                    21.4  mm       ----------  RV s&', lateral, S                        12.2  cm/s     ----------  Legend: (L)  and  (H)  mark values outside specified reference range.  ------------------------------------------------------------------- Prepared and Electronically Authenticated by  Fransico Him, MD 2018-07-25T10:45:51   Assessment/Plan: S/P Procedure(s) (LRB): TRANSCATHETER AORTIC VALVE REPLACEMENT, TRANSFEMORAL (N/A)  TRANSESOPHAGEAL ECHOCARDIOGRAM (TEE) (N/A)  POD 2  She is doing well in sinus rhythm. Echo looks good with low mean gradient and no paravalvular leak, normal EF   Plan home today and ASA and Plavix  Follow up as noted by Dr. Angelena Form.   LOS: 2 days    Gaye Pollack 07/29/2016

## 2016-07-29 NOTE — Care Management Note (Signed)
Case Management Note  Patient Details  Name: [name] MRN: [id number] Date of Birth: 1931/07/27  Subjective/Objective:  From home, s/p TAVR.  She will have her daughter Eustaquio Maize and her sister Joanne Chars who is a Therapist, sports , with her at discharge. She is ambulating in hall with RN, she has PCP , Dr. Marisue Humble, she has medication coverage.  She has walker at home but does not use it. She has a walk in shower with shower chair.   7/26 Lenora, BSN - for dc today, no needs.                                  Action/Plan:   Expected Discharge Date:  07/29/16               Expected Discharge Plan:  Home/Self Care  In-House Referral:     Discharge planning Services  CM Consult  Post Acute Care Choice:    Choice offered to:     DME Arranged:    DME Agency:     HH Arranged:    HH Agency:     Status of Service:  Completed, signed off  If discussed at [employer] of Stay Meetings, dates discussed:    Additional Comments:  [name], RN 07/29/2016, 10:20 AM

## 2016-07-29 NOTE — Discharge Summary (Signed)
Discharge Summary    Patient ID: [name],  MRN: [id number], DOB/AGE: 81/28/1933 81 y.o.  Admit date: 07/27/2016 Discharge date: 07/29/2016  Primary Care Provider: Gaynelle George Primary Cardiologist: Dr. Radford George / Dr. Angelena George (TAVR)   Discharge Diagnoses    Principal Problem:   Aortic stenosis, severe Active Problems:   SVT (supraventricular tachycardia) (HCC)   Chronic diastolic heart failure (HCC)   Obese   Breast cancer, right breast (HCC)   PUD (peptic ulcer disease)   Hypothyroidism   Hypercholesteremia   CAD (coronary artery disease), native coronary artery   Rheumatic fever   Allergies Allergies  Allergen Reactions  . Atorvastatin Other (See Comments)    Myalgias 10/2011  . Compazine [Prochlorperazine] Other (See Comments)    unknown  . Floxin [Ofloxacin] Other (See Comments)    unknown  . Livalo [Pitavastatin] Other (See Comments)    Leg pain  . Pravastatin Sodium Other (See Comments)    mylgias 09/2011  . Simvastatin Other (See Comments)    Elevated lft's 06/2011  . Fenofibrate Rash    Rash 09/2011     History of Present Illness     [name] is a 83 y.o. female with a history of rheumatic fever during childhood, CKD, HLD, hypothyroidism, breast cancer s/p lumpectomy followed by chemo/rad and severe symptomatic aortic stenosis who presented to [facility] on 07/27/16 for planned TAVR.   The patient has been told of the presence of a heart murmur for many years. She has been followed for the last several years by Dr. Radford George. Echocardiograms have demonstrated the presence of aortic stenosis that has gradually progressed. In 2017 the patient began to experience symptoms of exertional shortness of breath and fatigue. She underwent nuclear stress test at that time that was felt to be moderate risk for ischemia. Diagnostic cardiac catheterization revealed mild nonobstructive coronary artery disease. Echocardiogram revealed normal left ventricular  systolic function and moderate aortic stenosis. Over the past year symptoms of exertional shortness of breath that progressed. Recent follow-up echocardiogram performed 05/11/2016 revealed severe aortic stenosis with peak velocity across the aortic valve measured greater than 4.1 m/s corresponding to mean transvalvular gradient estimated 37 mmHg. Exercise treadmill test revealed no ischemia but moderately impaired exercise capacity.   The patient was referred to Dr. Burt George for consultation and subsequently underwent CT angiography to evaluate the feasibility of transcatheter aortic valve replacement. She was referred for surgical consultation and evaluated by Dr. Cyndia George on 07/06/2016 and Dr. Roxy George on 07/15/16 who both felt she was appropriate for TAVR surgery. This was set up for 07/27/16.   Hospital Course     Consultants: followed by [facility] as well  She underwent successful placement of an Edwards Sapien 3 THV (size 1mm) on 07/27/16 with Dr. Angelena George and Dr. Cyndia George. She has done well with no post operative complications. 2D ECHO on 07/28/16 showed a normally functioning bioprosthetic AVR with no perivalvular leak/AI. She is on ASA and Plavix. Plan is for discharge home today with follow up in 1 week and in 1 month with a repeat echocardiogram.    The patient has had an uncomplicated hospital course and is recovering well. The femoral catheter site is stable. She has been seen by Dr. Angelena George today and deemed ready for discharge home. All follow-up appointments have been scheduled. Discharge medications are listed below.  _____________  Discharge Vitals Blood pressure (!) 131/50, pulse (!) 56, temperature 97.7 F (36.5 C), temperature source Oral, resp. rate 14, height  5\' 2"  (1.575 m), weight 185 lb 3 oz (84 kg), SpO2 94 %.  Filed Weights   07/27/16 0630 07/29/16 0500  Weight: 185 lb (83.9 kg) 185 lb 3 oz (84 kg)    Labs & Radiologic Studies     CBC  Recent Labs  07/28/16 0446 07/29/16 0604   WBC 5.2 6.2  HGB 12.0 13.2  HCT 36.8 39.9  MCV 94.1 93.2  PLT 184 950   Basic Metabolic Panel  Recent Labs  07/28/16 0446 07/29/16 0604  NA 138 136  K 4.0 4.0  CL 102 98*  CO2 30 29  GLUCOSE 95 101*  BUN 15 15  CREATININE 0.93 0.84  CALCIUM 8.9 9.1  MG 2.1  --    Liver Function Tests No results for input(s): AST, ALT, ALKPHOS, BILITOT, PROT, ALBUMIN in the last 72 hours. No results for input(s): LIPASE, AMYLASE in the last 72 hours. Cardiac Enzymes No results for input(s): CKTOTAL, CKMB, CKMBINDEX, TROPONINI in the last 72 hours. BNP Invalid input(s): POCBNP D-Dimer No results for input(s): DDIMER in the last 72 hours. Hemoglobin A1C No results for input(s): HGBA1C in the last 72 hours. Fasting Lipid Panel No results for input(s): CHOL, HDL, LDLCALC, TRIG, CHOLHDL, LDLDIRECT in the last 72 hours. Thyroid Function Tests No results for input(s): TSH, T4TOTAL, T3FREE, THYROIDAB in the last 72 hours.  Invalid input(s): FREET3  Dg Chest 2 View  Result Date: 07/21/2016 CLINICAL DATA:  83 y/o  F; severe aortic stenosis. EXAM: CHEST  2 VIEW COMPARISON:  08/05/2014 chest radiograph FINDINGS: Stable normal cardiac silhouette. Aortic atherosclerosis with calcification. Clear lungs. No pleural effusion or pneumothorax. Mild degenerative changes of the thoracic spine. Right upper quadrant cholecystectomy clips. IMPRESSION: No acute pulmonary process identified. Electronically Signed   By: Kristine Garbe M.D.   On: 07/21/2016 16:36   Ct Coronary Morph W/cta Cor W/score W/ca W/cm &/or Wo/cm  Addendum Date: 06/30/2016   ADDENDUM REPORT: 06/30/2016 17:25 CLINICAL DATA:  83 year old female with severe aortic stenosis. EXAM: Cardiac TAVR CT TECHNIQUE: The patient was scanned on a Philips 256 scanner. A 120 kV retrospective scan was triggered in the descending thoracic aorta at 111 HU's. Gantry rotation speed was 270 msecs and collimation was .9 mm. 5 mg of IV Metoprolol and no  nitro were given. The 3D data set was reconstructed in 5% intervals of the R-R cycle. Systolic and diastolic phases were analyzed on a dedicated work station using MPR, MIP and VRT modes. The patient received 80 cc of contrast. FINDINGS: Aortic Valve: Trileaflet, severely thickened and calcified aortic valve with severely restricted leaflet opening. There are asymmetric calcifications predominantly in the non-coronary cusp extending into the LVOT. Aorta:  Normal size, no dissection.  Mild diffuse calcifications. Sinotubular Junction:  27 x 25 mm Ascending Thoracic Aorta:  31 x 30 mm Aortic Arch:  26 x 26 mm Descending Thoracic Aorta:  23 x 22 mm Sinus of Valsalva Measurements: Non-coronary:  30 mm Right -coronary:  29 mm Left -coronary:  30 mm Coronary Artery Height above Annulus: Left Main:  14 mm Right Coronary:  16 mm Virtual Basal Annulus Measurements: Maximum/Minimum Diameter:  27 x 21 mm Perimeter:  77 mm Area:  447 mm2 Optimum Fluoroscopic Angle for Delivery:  LAO 6 CAU 6 IMPRESSION: 1. Trileaflet, severely thickened and calcified aortic valve with severely restricted leaflet opening. There are asymmetric calcifications predominantly in the non-coronary cusp extending into the LVOT. Annular measurements suitable for delivery of a 26 mm  Edwards-SAPIEN 3 valve. 2. Sufficient annulus to coronary distance. 3. Optimum Fluoroscopic Angle for Delivery:  LAO 6 CAU 6 4. No thrombus in the left atrial appendage. Ena Dawley Electronically Signed   By: Ena Dawley   On: 06/30/2016 17:25   Result Date: 06/30/2016 EXAM: OVER-READ INTERPRETATION  CT CHEST The following report is an over-read performed by radiologist Dr. Collene Leyden [facility] [place] on 06/30/2016. This over-read does not include interpretation of cardiac or coronary anatomy or pathology. The coronary CTA interpretation by the cardiologist is attached. COMPARISON:  None. FINDINGS: Cardiovascular: Heart is upper limits normal in size.  Aorta is normal caliber. Mediastinum/Nodes: No mediastinal, hilar, or axillary adenopathy. Trachea and esophagus are unremarkable. Lungs/Pleura: No confluent airspace opacities or effusions. Upper Abdomen: Imaging into the upper abdomen shows no acute findings. Musculoskeletal: Chest wall soft tissues are unremarkable. No acute bony abnormality. IMPRESSION: No acute or significant extracardiac abnormality. Electronically Signed: By: Rolm Baptise M.D. On: 06/30/2016 11:25   Dg Chest Port 1 View  Result Date: 07/28/2016 CLINICAL DATA:  Post TAVR EXAM: PORTABLE CHEST 1 VIEW COMPARISON:  07/27/2016; chest CT - 06/30/2016 FINDINGS: Grossly unchanged cardiac silhouette and mediastinal contours with atherosclerotic plaque within thoracic aorta. Stable position of support apparatus. Stable sequela of trans catheter aortic valve replacement. Mild pulmonary venous congestion without frank evidence of edema. Minimal bilateral infrahilar opacities favored to represent atelectasis. No pleural effusion or pneumothorax. No evidence of edema. No acute osseus abnormalities. IMPRESSION: 1. Similar findings of mild pulmonary venous congestion without frank evidence of edema. 2. Minimal bilateral infrahilar atelectasis. 3.  Aortic Atherosclerosis (ICD10-I70.0). Electronically Signed   By: Sandi Mariscal M.D.   On: 07/28/2016 07:43   Dg Chest Port 1 View  Result Date: 07/27/2016 CLINICAL DATA:  Aortic valve repair . EXAM: PORTABLE CHEST 1 VIEW COMPARISON:  07/21/2016. FINDINGS: Right IJ line noted with its tip at the cavoatrial junction. Prior aortic valve repair. Cardiomegaly with mild pulmonary venous congestion. Mild interstitial prominence P No pleural effusion or pneumothorax . IMPRESSION: 1. Right IJ line noted with tip at cavoatrial junction. 2. Aortic valve repair. Cardiomegaly with mild pulmonary venous congestion and bilateral interstitial prominence suggesting mild CHF. Electronically Signed   By: Marcello Moores  Register   On:  07/27/2016 10:37   Ct Angio Chest Aorta W &/or Wo Contrast  Result Date: 06/30/2016 CLINICAL DATA:  83 year old female with history of severe aortic stenosis. Preprocedural study prior to potential transcatheter aortic valve replacement (TAVR) procedure. EXAM: CT ANGIOGRAPHY CHEST, ABDOMEN AND PELVIS TECHNIQUE: Multidetector CT imaging through the chest, abdomen and pelvis was performed using the standard protocol during bolus administration of intravenous contrast. Multiplanar reconstructed images and MIPs were obtained and reviewed to evaluate the vascular anatomy. CONTRAST:  150 mL of Isovue 370. COMPARISON:  CT the abdomen and pelvis 06/09/2004. Chest CT 03/17/2005. FINDINGS: CTA CHEST FINDINGS Cardiovascular: Heart size is mildly enlarged with left atrial dilatation. There is no significant pericardial fluid, thickening or pericardial calcification. There is aortic atherosclerosis, as well as atherosclerosis of the great vessels of the mediastinum and the coronary arteries, including calcified atherosclerotic plaque in the left main and left anterior descending coronary arteries. Calcifications of the aortic valve. Mild calcifications of the mitral annulus. Mediastinum/Lymph Nodes: No pathologically enlarged mediastinal or hilar lymph nodes. Esophagus is unremarkable in appearance. No axillary lymphadenopathy. Lungs/Pleura: 8 x 4 mm nodule in the right middle lobe associated with the minor fissure (axial image 75 of series 7), stable compared to remote  prior study from 2007, considered definitively benign (presumably a subpleural lymph node). No acute consolidative airspace disease. No pleural effusions. Musculoskeletal/Soft Tissues: There are no aggressive appearing lytic or blastic lesions noted in the visualized portions of the skeleton. CTA ABDOMEN AND PELVIS FINDINGS Hepatobiliary: No cystic or solid hepatic lesions. No intra or extrahepatic biliary ductal dilatation. Status post cholecystectomy.  Pancreas: No pancreatic mass. No pancreatic ductal dilatation. No pancreatic or peripancreatic fluid or inflammatory changes. Spleen: Unremarkable. Adrenals/Urinary Tract: Bilateral kidneys and bilateral adrenal glands are normal in appearance. No hydroureteronephrosis. Urinary bladder is largely obscured by extensive beam hardening artifact from the patient's bilateral hip arthroplasties. Stomach/Bowel: The appearance of the stomach is normal. There is no pathologic dilatation of small bowel or colon. The appendix is not confidently identified and may be surgically absent. Regardless, there are no inflammatory changes noted adjacent to the cecum to suggest the presence of an acute appendicitis at this time. Vascular/Lymphatic: Aortic atherosclerosis with vascular findings and measurements pertinent to potential TAVR procedure, as detailed below. Portions of the common femoral arteries are obscured by beam hardening artifact, as discussed below, and are considered uninterpretable. No aneurysm or dissection noted in the abdominal or pelvic vasculature. Celiac axis, superior mesenteric artery and inferior mesenteric artery all appear patent without hemodynamically significant stenosis. Single renal arteries bilaterally are patent without hemodynamically significant stenosis. No lymphadenopathy noted in the abdomen or pelvis. Reproductive: 3.3 x 2.8 x 2.0 cm mass in the fundus of the uterus, nonspecific, but likely to represent a fibroid. Ovaries are atrophic. Other: Subtle fat containing mass in the retroperitoneum immediately adjacent to the inferior aspect of the pancreatic head and uncinate process measuring approximately 3.4 x 2.4 x 3.7 cm (axial image 161 of series 6 and coronal image 69 of series 9), increased in size compared to remote prior examination from 06/09/2004 at which point this lesion measured 2.1 x 1.6 cm on axial images. No significant volume of ascites. No pneumoperitoneum. Musculoskeletal: Status  post bilateral total hip arthroplasty. There are no aggressive appearing lytic or blastic lesions noted in the visualized portions of the skeleton. VASCULAR MEASUREMENTS PERTINENT TO TAVR: AORTA: Minimal Aortic Diameter -  15 x 15 mm Severity of Aortic Calcification -  moderate RIGHT PELVIS: Right Common Iliac Artery - Minimal Diameter - 9.3 x 8.3 mm Tortuosity - mild Calcification - mild Right External Iliac Artery - Minimal Diameter - 6.4 x 6.1 mm Tortuosity - mild Calcification - none Right Common Femoral Artery - Minimal Diameter - uninterpretable secondary to artifact Tortuosity - mild Calcification - mild LEFT PELVIS: Left Common Iliac Artery - Minimal Diameter - 10.1 x 8.4 mm Tortuosity - mild Calcification - mild Left External Iliac Artery - Minimal Diameter - 6.5 x 6.2 mm Tortuosity - mild Calcification - none Left Common Femoral Artery - Minimal Diameter - uninterpretable secondary to artifact. Tortuosity - mild Calcification - mild Review of the MIP images confirms the above findings. IMPRESSION: 1. Vascular findings and measurements pertinent to potential TAVR procedure, as detailed above. This patient appears likely to have suitable pelvic arterial access bilaterally, however, secondary to extensive beam hardening artifact from the patient's bilateral total hip arthroplasties, portions of the common femoral arteries are completely obscured and cannot be accurately evaluated on today's examination. However, given the minimal tortuosity in the pelvic vasculature and general mild degree of atherosclerosis throughout the remaining vasculature, it is highly likely that these common femoral arteries are suitable for access. This could be confirmed with catheter arteriography  if clinically appropriate. 2. Calcifications of the aortic valve, compatible with the reported clinical history of severe aortic stenosis. 3. Fatty attenuation mass in the retroperitoneum immediately inferior to the pancreatic head and  uncinate process measuring 3.4 x 2.4 x 3.7 cm which has slowly grown compared to remote prior examination from 2006. 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 A slow-growing low-grade retroperitoneal liposarcoma is not favored at this time, but is not entirely excluded, and attention on repeat CT of the abdomen and pelvis is suggested in 12 months to ensure stability. 4. Cardiomegaly with left atrial dilatation. 5. Additional incidental findings, as above. Electronically Signed   By: Vinnie Langton M.D.   On: 06/30/2016 14:10   Ct Angio Abd/pel W/ And/or W/o  Result Date: 06/30/2016 CLINICAL DATA:  83 year old female with history of severe aortic stenosis. Preprocedural study prior to potential transcatheter aortic valve replacement (TAVR) procedure. EXAM: CT ANGIOGRAPHY CHEST, ABDOMEN AND PELVIS TECHNIQUE: Multidetector CT imaging through the chest, abdomen and pelvis was performed using the standard protocol during bolus administration of intravenous contrast. Multiplanar reconstructed images and MIPs were obtained and reviewed to evaluate the vascular anatomy. CONTRAST:  150 mL of Isovue 370. COMPARISON:  CT the abdomen and pelvis 06/09/2004. Chest CT 03/17/2005. FINDINGS: CTA CHEST FINDINGS Cardiovascular: Heart size is mildly enlarged with left atrial dilatation. There is no significant pericardial fluid, thickening or pericardial calcification. There is aortic atherosclerosis, as well as atherosclerosis of the great vessels of the mediastinum and the coronary arteries, including calcified atherosclerotic plaque in the left main and left anterior descending coronary arteries. Calcifications of the aortic valve. Mild calcifications of the mitral annulus. Mediastinum/Lymph Nodes: No pathologically enlarged mediastinal or hilar lymph nodes. Esophagus is unremarkable in appearance. No axillary lymphadenopathy. Lungs/Pleura: 8 x 4 mm nodule in the right middle lobe associated with  the minor fissure (axial image 75 of series 7), stable compared to remote prior study from 2007, considered definitively benign (presumably a subpleural lymph node). No acute consolidative airspace disease. No pleural effusions. Musculoskeletal/Soft Tissues: There are no aggressive appearing lytic or blastic lesions noted in the visualized portions of the skeleton. CTA ABDOMEN AND PELVIS FINDINGS Hepatobiliary: No cystic or solid hepatic lesions. No intra or extrahepatic biliary ductal dilatation. Status post cholecystectomy. Pancreas: No pancreatic mass. No pancreatic ductal dilatation. No pancreatic or peripancreatic fluid or inflammatory changes. Spleen: Unremarkable. Adrenals/Urinary Tract: Bilateral kidneys and bilateral adrenal glands are normal in appearance. No hydroureteronephrosis. Urinary bladder is largely obscured by extensive beam hardening artifact from the patient's bilateral hip arthroplasties. Stomach/Bowel: The appearance of the stomach is normal. There is no pathologic dilatation of small bowel or colon. The appendix is not confidently identified and may be surgically absent. Regardless, there are no inflammatory changes noted adjacent to the cecum to suggest the presence of an acute appendicitis at this time. Vascular/Lymphatic: Aortic atherosclerosis with vascular findings and measurements pertinent to potential TAVR procedure, as detailed below. Portions of the common femoral arteries are obscured by beam hardening artifact, as discussed below, and are considered uninterpretable. No aneurysm or dissection noted in the abdominal or pelvic vasculature. Celiac axis, superior mesenteric artery and inferior mesenteric artery all appear patent without hemodynamically significant stenosis. Single renal arteries bilaterally are patent without hemodynamically significant stenosis. No lymphadenopathy noted in the abdomen or pelvis. Reproductive: 3.3 x 2.8 x 2.0 cm mass in the fundus of the uterus,  nonspecific, but likely to represent a fibroid. Ovaries are atrophic. Other: Subtle fat containing  mass in the retroperitoneum immediately adjacent to the inferior aspect of the pancreatic head and uncinate process measuring approximately 3.4 x 2.4 x 3.7 cm (axial image 161 of series 6 and coronal image 69 of series 9), increased in size compared to remote prior examination from 06/09/2004 at which point this lesion measured 2.1 x 1.6 cm on axial images. No significant volume of ascites. No pneumoperitoneum. Musculoskeletal: Status post bilateral total hip arthroplasty. There are no aggressive appearing lytic or blastic lesions noted in the visualized portions of the skeleton. VASCULAR MEASUREMENTS PERTINENT TO TAVR: AORTA: Minimal Aortic Diameter -  15 x 15 mm Severity of Aortic Calcification -  moderate RIGHT PELVIS: Right Common Iliac Artery - Minimal Diameter - 9.3 x 8.3 mm Tortuosity - mild Calcification - mild Right External Iliac Artery - Minimal Diameter - 6.4 x 6.1 mm Tortuosity - mild Calcification - none Right Common Femoral Artery - Minimal Diameter - uninterpretable secondary to artifact Tortuosity - mild Calcification - mild LEFT PELVIS: Left Common Iliac Artery - Minimal Diameter - 10.1 x 8.4 mm Tortuosity - mild Calcification - mild Left External Iliac Artery - Minimal Diameter - 6.5 x 6.2 mm Tortuosity - mild Calcification - none Left Common Femoral Artery - Minimal Diameter - uninterpretable secondary to artifact. Tortuosity - mild Calcification - mild Review of the MIP images confirms the above findings. IMPRESSION: 1. Vascular findings and measurements pertinent to potential TAVR procedure, as detailed above. This patient appears likely to have suitable pelvic arterial access bilaterally, however, secondary to extensive beam hardening artifact from the patient's bilateral total hip arthroplasties, portions of the common femoral arteries are completely obscured and cannot be accurately evaluated  on today's examination. However, given the minimal tortuosity in the pelvic vasculature and general mild degree of atherosclerosis throughout the remaining vasculature, it is highly likely that these common femoral arteries are suitable for access. This could be confirmed with catheter arteriography if clinically appropriate. 2. Calcifications of the aortic valve, compatible with the reported clinical history of severe aortic stenosis. 3. Fatty attenuation mass in the retroperitoneum immediately inferior to the pancreatic head and uncinate process measuring 3.4 x 2.4 x 3.7 cm which has slowly grown compared to remote prior examination from 2006. 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 A slow-growing low-grade retroperitoneal liposarcoma is not favored at this time, but is not entirely excluded, and attention on repeat CT of the abdomen and pelvis is suggested in 12 months to ensure stability. 4. Cardiomegaly with left atrial dilatation. 5. Additional incidental findings, as above. Electronically Signed   By: Vinnie Langton M.D.   On: 06/30/2016 14:10     Diagnostic Studies/Procedures    07/27/16 Procedure:       Transcatheter Aortic Valve Replacement - Percutaneous Right Transfemoral Approach             Edwards Sapien 3 THV (size 26 mm, model # 9600TFX, serial # [id number])               _____________  2D ECHO: 07/28/2016 LV EF: 55% -   60% Study Conclusions - Left ventricle: The cavity size was normal. There was moderate   focal basal hypertrophy. Systolic function was normal. The   estimated ejection fraction was in the range of 55% to 60%. Wall   motion was normal; there were no regional wall motion   abnormalities. Features are consistent with a pseudonormal left   ventricular filling pattern, with concomitant abnormal relaxation   and  increased filling pressure (grade 2 diastolic dysfunction).   Doppler parameters are consistent with high ventricular  filling   pressure. - Aortic valve: S/P TAVR. Poorly visulized bioprosthesis. There is   no apparent paravalvular leak. The mean AV gradient is 16mmHg and   the AVA is calculated at 1.2cm2. Poorly visualized. VTI ratio of   LVOT to aortic valve: 0.53. Valve area (VTI): 1.2 cm^2. Valve   area (Vmax): 1.19 cm^2. Valve area (Vmean): 1.24 cm^2. - Mitral valve: Calcified annulus. There was mild regurgitation. - Left atrium: The atrium was mildly to moderately dilated. - Pulmonic valve: Poorly visualized.   Disposition   Pt is being discharged home today in good condition.  Follow-up Plans & Appointments    Follow-up Information    [name], MD. Go on 08/25/2016.   Specialty:  Cardiology Why:  @ 9:30am for an ultrasound of your heart followed by an appointment with Dr. Angelena George @ 10:40am Contact information: [place]. [address] [place] [address] [phone]        Bhagat, Bhavinkumar, PA. Go on 08/05/2016.   Specialty:  Cardiology Why:  @ 11am. Please arrive at least 10 minutes early. Contact information: [place]  [address] [phone]            Discharge Medications     Medication List    STOP taking these medications   ibuprofen 200 MG tablet Commonly known as:  ADVIL,MOTRIN     TAKE these medications   aspirin EC 81 MG tablet Take 81 mg by mouth daily.   CALCIUM 600/VITAMIN D PO Take 1 tablet by mouth daily.   clopidogrel 75 MG tablet Commonly known as:  PLAVIX Take 1 tablet (75 mg total) by mouth daily with breakfast.   CoQ10 200 MG Caps Take 200 mg by mouth daily.   fish oil-omega-3 fatty acids 1000 MG capsule Take 1 g by mouth daily.   furosemide 20 MG tablet Commonly known as:  LASIX Take 1 tablet (20 mg total) by mouth every evening. What changed:  when to take this   lansoprazole 30 MG capsule Commonly known as:  PREVACID Take 30 mg by mouth daily at 12 noon.   levothyroxine 112  MCG tablet Commonly known as:  SYNTHROID, LEVOTHROID Take 112 mcg by mouth daily before breakfast.   magnesium gluconate 500 MG tablet Commonly known as:  MAGONATE Take 500 mg by mouth 2 (two) times daily.   multivitamin with minerals Tabs tablet Take 1 tablet by mouth daily.   potassium chloride 10 MEQ tablet Commonly known as:  K-DUR,KLOR-CON Take 1 tablet (10 mEq total) by mouth daily.   vitamin B-12 1000 MCG tablet Commonly known as:  CYANOCOBALAMIN Take 1,000 mcg by mouth daily.   vitamin C 500 MG tablet Commonly known as:  ASCORBIC ACID Take 500 mg by mouth daily.   Vitamin D (Ergocalciferol) 50000 units Caps capsule Commonly known as:  DRISDOL Take 50,000 Units by mouth every 7 (seven) days. Saturday        Outstanding Labs/Studies   2D ECHO in 1 month   Duration of Discharge Encounter   Greater than 30 minutes including physician time.  Signed, Janice Form PA-C 07/29/2016, 9:32 AM

## 2016-07-30 ENCOUNTER — Telehealth (HOSPITAL_COMMUNITY): Payer: Self-pay

## 2016-07-30 NOTE — Telephone Encounter (Signed)
Patient insurances are active and benefits verified. Patient insurances are Medicare A/B and [place]. Medicare A/B - no co-payment, deductible $183.00/$183.00 has been met, 20% co-insurance, no out of pocket and no limit on visit. Passport/reference [phone].  Ord -  No co-payment, no deductible, no put of pocket, no co-insurance and no pre-authorization. Follows medicare guidelines. Passport/reference [id number].  Patient will be contacted and scheduled after their follow up appointment with the cardiologist on 08/05/16, upon review by [facility] RN navigator.

## 2016-08-04 NOTE — Progress Notes (Signed)
Cardiology Office Note    Date:  08/05/2016   ID:  [name], DOB [birth year], MRN [id number]  PCP:  Gaynelle Arabian, MD  Cardiologist: Dr. Radford Pax / Dr. Angelena Form (TAVR)  Chief Complaint: TAVR follow up  History of Present Illness:   [name] is a 83 y.o. female fever during childhood, CKD, HLD, hypothyroidism, breast cancer s/p lumpectomy followed by chemo/rad and severe symptomatic aortic stenosis s/p TVAR presents for follow up.   The patient has been told of the presence of a heart murmur for many years. She has been followed for the last several years by Dr. Radford Pax. Echocardiograms have demonstrated the presence of aortic stenosis that has gradually progressed. In 2017 the patient began to experience symptoms of exertional shortness of breath and fatigue. She underwent nuclear stress test at that time that was felt to be moderate risk for ischemia. Diagnostic cardiac catheterization revealed mild nonobstructive coronary artery disease. Echocardiogram revealed normal left ventricular systolic function and moderate aortic stenosis. Over the past year symptoms of exertional shortness of breath that progressed. Recent follow-up echocardiogram performed 05/11/2016 revealed severe aortic stenosis with peak velocity across the aortic valve measured greater than 4.1 m/s corresponding to mean transvalvular gradient estimated 37 mmHg. Exercise treadmill test revealed no ischemia but moderately impaired exercise capacity.   The patient was referred to Dr. Burt Knack for consultation and subsequently underwent CT angiography to evaluate the feasibility of transcatheter aortic valve replacement. She was referred for surgical consultation and evaluated by Dr. Cyndia Bent on 07/06/2016 and Dr. Roxy Manns on 07/15/16 who both felt she was appropriate for TAVR surgery.  She underwent successful placement of an Edwards Sapien 3 THV (size 60mm) on 07/27/16 with Dr. Angelena Form and Dr. Cyndia Bent. Post op echo showed a  normally functioning bioprosthetic AVR with no perivalvular leak/AI. Patient had uncomplicated course.   Here today for follow up. Patient continues to have dyspnea on exertion however this has been improving gradually. She is able to walk 30 minutes per day with few rest. No orthopnea, PND, syncope, palpitation, chest pain, dizziness, melena or blood in her stool or urine.   Past Medical History:  Diagnosis Date  . Aortic stenosis, severe    a.07/30/16 s/p TAVR   . Breast cancer ([place])    a. s/p lumpectomy, chemo/rad   . Breast cancer, right breast ([place])   . CAD (coronary artery disease), native coronary artery 10/20/2015   a. mild non obst CAD  . CKD (chronic kidney disease), stage III   . Diastolic dysfunction   . Hypercholesteremia   . Hypothyroidism   . Osteopenia   . PUD (peptic ulcer disease)   . Rheumatic fever   . SVT (supraventricular tachycardia) [facility]) October 2014   converted with vagal maneuver  . Vitamin D deficiency     Past Surgical History:  Procedure Laterality Date  . BREAST SURGERY     RIGHT LUMPECTOMY AND REMOVAL OF LYMPH NODES  . CARDIAC CATHETERIZATION N/A 07/18/2015   Procedure: Right/Left Heart Cath and Coronary Angiography;  Surgeon: [name], MD;  Location: [place] [facility];  Service: Cardiovascular;  Laterality: N/A;  . CATARACT EXTRACTION W/ INTRAOCULAR LENS  IMPLANT, BILATERAL     2017  . CHOLECYSTECTOMY N/A 08/06/2014   Procedure: LAPAROSCOPIC CHOLECYSTECTOMY ;  Surgeon: Rolm Bookbinder, MD;  Location: [place];  Service: General;  Laterality: N/A;  . REPLACEMENT TOTAL KNEE Right 2009  . TEE WITHOUT CARDIOVERSION N/A 07/27/2016   Procedure: TRANSESOPHAGEAL ECHOCARDIOGRAM (TEE);  Surgeon: [name], MD;  Location: [place];  Service: Open Heart Surgery;  Laterality: N/A;  . THYROID SURGERY     NODULE REMOVED  . TOTAL HIP ARTHROPLASTY Left 2003  . TOTAL HIP ARTHROPLASTY Right 08/28/2013   Procedure: RIGHT TOTAL HIP  ARTHROPLASTY ANTERIOR APPROACH;  Surgeon: Mauri Pole, MD;  [facility];  Service: Orthopedics;  Laterality: Right;  . TRANSCATHETER AORTIC VALVE REPLACEMENT, TRANSFEMORAL N/A 07/27/2016   Procedure: TRANSCATHETER AORTIC VALVE REPLACEMENT, TRANSFEMORAL;  Surgeon: [name], MD;  Location: [place];  Service: Open Heart Surgery;  Laterality: N/A;    Current Medications: Prior to Admission medications   Medication Sig Start Date End Date Taking? Authorizing Provider  aspirin EC 81 MG tablet Take 81 mg by mouth daily.    [provider]  Calcium Carbonate-Vitamin D (CALCIUM 600/VITAMIN D PO) Take 1 tablet by mouth daily.    [provider]  clopidogrel (PLAVIX) 75 MG tablet Take 1 tablet (75 mg total) by mouth daily with breakfast. 07/30/16   Eileen Stanford, PA-C  Coenzyme Q10 (COQ10) 200 MG CAPS Take 200 mg by mouth daily.    [provider]  fish oil-omega-3 fatty acids 1000 MG capsule Take 1 g by mouth daily.    [provider]  furosemide (LASIX) 20 MG tablet Take 1 tablet (20 mg total) by mouth every evening. Patient taking differently: Take 20 mg by mouth daily.  04/22/16   Sueanne Margarita, MD  lansoprazole (PREVACID) 30 MG capsule Take 30 mg by mouth daily at 12 noon.     [provider]  levothyroxine (SYNTHROID, LEVOTHROID) 112 MCG tablet Take 112 mcg by mouth daily before breakfast.    [provider]  magnesium gluconate (MAGONATE) 500 MG tablet Take 500 mg by mouth 2 (two) times daily.     [provider]  Multiple Vitamin (MULTIVITAMIN WITH MINERALS) TABS tablet Take 1 tablet by mouth daily.    [provider]  potassium chloride (K-DUR,KLOR-CON) 10 MEQ tablet Take 1 tablet (10 mEq total) by mouth daily. 04/22/16   Sueanne Margarita, MD  vitamin B-12 (CYANOCOBALAMIN) 1000 MCG tablet Take 1,000 mcg by mouth daily.    [provider]  vitamin C (ASCORBIC ACID) 500 MG tablet Take 500 mg by  mouth daily.    [provider]  Vitamin D, Ergocalciferol, (DRISDOL) 50000 UNITS CAPS capsule Take 50,000 Units by mouth every 7 (seven) days. Saturday    [provider]    Allergies:   Atorvastatin; Compazine [prochlorperazine]; Floxin [ofloxacin]; Livalo [pitavastatin]; Pravastatin sodium; Simvastatin; and Fenofibrate   Social History   Social History  . Marital status: Widowed    Spouse name: N/A  . Number of children: N/A  . Years of education: N/A   Social History Main Topics  . Smoking status: Never Smoker  . Smokeless tobacco: Never Used  . Alcohol use No  . Drug use: No  . Sexual activity: Not Asked   Other Topics Concern  . None   Social History Narrative  . None     Family History:  The patient's family history includes Heart disease in her father and mother.   ROS:   Please see the history of present illness.    ROS All other systems reviewed and are negative.   PHYSICAL EXAM:   VS:  BP 132/72   Pulse (!) 59   Ht 5\' 2"  (1.575 m)   Wt 184 lb (83.5 kg)  LMP  (LMP Unknown)   SpO2 95%   BMI 33.65 kg/m    GEN: Well nourished, well developed, in no acute distress  HEENT: normal  Neck: no JVD, carotid bruits, or masses Cardiac: RRR; 2/6 systolic murmurs, rubs, or gallops,no edema  Respiratory:  clear to auscultation bilaterally, normal work of breathing GI: soft, nontender, nondistended, + BS MS: no deformity or atrophy  Skin: warm and dry, no rash Neuro:  Alert and Oriented x 3, Strength and sensation are intact Psych: euthymic mood, full affect  Wt Readings from Last 3 Encounters:  08/05/16 184 lb (83.5 kg)  07/29/16 185 lb 3 oz (84 kg)  07/21/16 187 lb 4.8 oz (85 kg)      Studies/Labs Reviewed:   EKG:  EKG is ordered today.  The ekg ordered today demonstrates SR at rate of 59 bpm. PR interval of 275ms  Recent Labs: 04/22/2016: NT-Pro BNP 307 07/21/2016: ALT 16 07/28/2016: Magnesium 2.1 07/29/2016: BUN 15; Creatinine, Ser  0.84; Hemoglobin 13.2; Platelets 182; Potassium 4.0; Sodium 136   Lipid Panel No results found for: CHOL, TRIG, HDL, CHOLHDL, VLDL, LDLCALC, LDLDIRECT  Additional studies/ records that were reviewed today include:   As above  ASSESSMENT & PLAN:    1. S/p TVAR -   She is recovering well. Dyspnea has been improving.  No significant murmur appreciated. Continue ASA and plavix. F/u with Dr. Angelena Form with echo in few few weeks.    Medication Adjustments/Labs and Tests Ordered: Current medicines are reviewed at length with the patient today.  Concerns regarding medicines are outlined above.  Medication changes, Labs and Tests ordered today are listed in the Patient Instructions below. Patient Instructions  Medication Instructions:  Your physician recommends that you continue on your current medications as directed. Please refer to the Current Medication list given to you today.   Labwork: None orderd  Testing/Procedures: None ordered  Follow-Up: Your physician recommends that you schedule a follow-up appointment in: WITH DR. Angelena Form AS PLANNED   Any Other Special Instructions Will Be Listed Below (If Applicable).     If you need a refill on your cardiac medications before your next appointment, please call your pharmacy.      Jarrett Soho, [place]  08/05/2016 10:52 AM    [facility] [place], [place], [place]  [address] Phone: [phone]; Fax: [phone]

## 2016-08-05 ENCOUNTER — Ambulatory Visit (INDEPENDENT_AMBULATORY_CARE_PROVIDER_SITE_OTHER): Payer: Medicare Other | Admitting: Physician Assistant

## 2016-08-05 ENCOUNTER — Encounter (INDEPENDENT_AMBULATORY_CARE_PROVIDER_SITE_OTHER): Payer: Self-pay

## 2016-08-05 ENCOUNTER — Encounter: Payer: Self-pay | Admitting: Physician Assistant

## 2016-08-05 VITALS — BP 132/72 | HR 59 | Ht 62.0 in | Wt 184.0 lb

## 2016-08-05 DIAGNOSIS — I350 Nonrheumatic aortic (valve) stenosis: Secondary | ICD-10-CM | POA: Diagnosis not present

## 2016-08-05 DIAGNOSIS — I2510 Atherosclerotic heart disease of native coronary artery without angina pectoris: Secondary | ICD-10-CM

## 2016-08-05 DIAGNOSIS — Z952 Presence of prosthetic heart valve: Secondary | ICD-10-CM

## 2016-08-05 DIAGNOSIS — I471 Supraventricular tachycardia: Secondary | ICD-10-CM | POA: Diagnosis not present

## 2016-08-05 NOTE — Patient Instructions (Signed)
Medication Instructions:  Your physician recommends that you continue on your current medications as directed. Please refer to the Current Medication list given to you today.   Labwork: None orderd  Testing/Procedures: None ordered  Follow-Up: Your physician recommends that you schedule a follow-up appointment in: WITH DR. Angelena Form AS PLANNED   Any Other Special Instructions Will Be Listed Below (If Applicable).     If you need a refill on your cardiac medications before your next appointment, please call your pharmacy.

## 2016-08-06 ENCOUNTER — Encounter: Payer: Self-pay | Admitting: *Deleted

## 2016-08-25 ENCOUNTER — Ambulatory Visit (HOSPITAL_COMMUNITY): Payer: Medicare Other | Attending: Cardiology

## 2016-08-25 ENCOUNTER — Ambulatory Visit (INDEPENDENT_AMBULATORY_CARE_PROVIDER_SITE_OTHER): Payer: Medicare Other | Admitting: Cardiovascular Disease

## 2016-08-25 ENCOUNTER — Other Ambulatory Visit: Payer: Self-pay

## 2016-08-25 ENCOUNTER — Encounter: Payer: Self-pay | Admitting: Cardiovascular Disease

## 2016-08-25 VITALS — BP 110/70 | HR 56 | Ht 62.0 in | Wt 187.0 lb

## 2016-08-25 DIAGNOSIS — I082 Rheumatic disorders of both aortic and tricuspid valves: Secondary | ICD-10-CM | POA: Diagnosis not present

## 2016-08-25 DIAGNOSIS — E785 Hyperlipidemia, unspecified: Secondary | ICD-10-CM | POA: Insufficient documentation

## 2016-08-25 DIAGNOSIS — Z952 Presence of prosthetic heart valve: Secondary | ICD-10-CM | POA: Diagnosis not present

## 2016-08-25 DIAGNOSIS — I471 Supraventricular tachycardia: Secondary | ICD-10-CM | POA: Insufficient documentation

## 2016-08-25 DIAGNOSIS — I350 Nonrheumatic aortic (valve) stenosis: Secondary | ICD-10-CM | POA: Diagnosis not present

## 2016-08-25 DIAGNOSIS — I2510 Atherosclerotic heart disease of native coronary artery without angina pectoris: Secondary | ICD-10-CM

## 2016-08-25 NOTE — Patient Instructions (Signed)
Medication Instructions:  Your physician recommends that you continue on your current medications as directed. Please refer to the Current Medication list given to you today.   Labwork: none  Testing/Procedures: Your physician has requested that you have an echocardiogram. Echocardiography is a painless test that uses sound waves to create images of your heart. It provides your doctor with information about the size and shape of your heart and how well your heart's chambers and valves are working. This procedure takes approximately one hour. There are no restrictions for this procedure.  To be done in 11 months on day of appointment with Dr. Angelena Form.   Follow-Up: Your physician recommends that you schedule a follow-up appointment in: 6 months. Please call our office in about 3 months to schedule this appointment    Any Other Special Instructions Will Be Listed Below (If Applicable).     If you need a refill on your cardiac medications before your next appointment, please call your pharmacy.

## 2016-08-25 NOTE — Progress Notes (Signed)
Chief Complaint  Patient presents with  . Follow-up    TAVR    History of Present Illness: 83 yo female with CAD, HLD, SVT, breast cancer and severe aortic valve stenosis who is now s/p TAVR on 07/27/16 and here today for her one month TAVR follow up. She is followed in our office by Dr. Radford Pax. She has been followed for aortic stenosis for many years and has noted progressive dyspnea. Her aortic stenosis had progressed and was felt to be responsible for her dyspnea. She underwent TAVR on 07/27/16 with placement of a 26 mm Edwards Sapien 3 bioprosthetic AVR from the right femoral artery. Post operative echo showed a normally functioning AVR. She was discharged home on ASA and Plavix and was doing well when seen in our office for one week f/u on 08/05/16.   She is here today for follow up. The patient denies any chest pain, dyspnea, palpitations, lower extremity edema, orthopnea, PND, dizziness, near syncope or syncope. Her echo today shows normal LV systolic function with normally functioning bioprosthetic aortic valve. The mean gradient across the valve is 16 mm Hg.    Primary Care Physician: Gaynelle Arabian, MD Primary Cardiologist: [facility]  Past Medical History:  Diagnosis Date  . Aortic stenosis, severe    a.07/30/16 s/p TAVR   . Breast cancer ([place])    a. s/p lumpectomy, chemo/rad   . Breast cancer, right breast ([place])   . CAD (coronary artery disease), native coronary artery 10/20/2015   a. mild non obst CAD  . CKD (chronic kidney disease), stage III   . Diastolic dysfunction   . Hypercholesteremia   . Hypothyroidism   . Osteopenia   . PUD (peptic ulcer disease)   . Rheumatic fever   . SVT (supraventricular tachycardia) [facility]) October 2014   converted with vagal maneuver  . Vitamin D deficiency     Past Surgical History:  Procedure Laterality Date  . BREAST SURGERY     RIGHT LUMPECTOMY AND REMOVAL OF LYMPH NODES  . CARDIAC CATHETERIZATION N/A 07/18/2015     Procedure: Right/Left Heart Cath and Coronary Angiography;  Surgeon: [name], MD;  Location: [facility];  Service: Cardiovascular;  Laterality: N/A;  . CATARACT EXTRACTION W/ INTRAOCULAR LENS  IMPLANT, BILATERAL     2017  . CHOLECYSTECTOMY N/A 08/06/2014   Procedure: LAPAROSCOPIC CHOLECYSTECTOMY ;  Surgeon: Rolm Bookbinder, MD;  Location: [facility];  Service: General;  Laterality: N/A;  . REPLACEMENT TOTAL KNEE Right 2009  . TEE WITHOUT CARDIOVERSION N/A 07/27/2016   Procedure: TRANSESOPHAGEAL ECHOCARDIOGRAM (TEE);  Surgeon: [name], MD;  Location: [facility];  Service: Open Heart Surgery;  Laterality: N/A;  . THYROID SURGERY     NODULE REMOVED  . TOTAL HIP ARTHROPLASTY Left 2003  . TOTAL HIP ARTHROPLASTY Right 08/28/2013   Procedure: RIGHT TOTAL HIP ARTHROPLASTY ANTERIOR APPROACH;  Surgeon: Mauri Pole, MD;  [facility];  Service: Orthopedics;  Laterality: Right;  . TRANSCATHETER AORTIC VALVE REPLACEMENT, TRANSFEMORAL N/A 07/27/2016   Procedure: TRANSCATHETER AORTIC VALVE REPLACEMENT, TRANSFEMORAL;  Surgeon: [name], MD;  Location: [facility];  Service: Open Heart Surgery;  Laterality: N/A;    Current Outpatient Prescriptions  Medication Sig Dispense Refill  . aspirin EC 81 MG tablet Take 81 mg by mouth daily.    . Calcium Carbonate-Vitamin D (CALCIUM 600/VITAMIN D PO) Take 1 tablet by mouth daily.    . clopidogrel (PLAVIX) 75 MG tablet Take 1 tablet (  75 mg total) by mouth daily with breakfast. 90 tablet 1  . Coenzyme Q10 (COQ10) 200 MG CAPS Take 200 mg by mouth daily.    . fish oil-omega-3 fatty acids 1000 MG capsule Take 1 g by mouth daily.    . furosemide (LASIX) 20 MG tablet Take 20 mg by mouth.    . lansoprazole (PREVACID) 30 MG capsule Take 30 mg by mouth daily at 12 noon.     Marland Kitchen levothyroxine (SYNTHROID, LEVOTHROID) 112 MCG tablet Take 112 mcg by mouth daily before breakfast.    . magnesium gluconate (MAGONATE) 500 MG tablet Take 500  mg by mouth 2 (two) times daily.     . Multiple Vitamin (MULTIVITAMIN WITH MINERALS) TABS tablet Take 1 tablet by mouth daily.    . potassium chloride (K-DUR,KLOR-CON) 10 MEQ tablet Take 1 tablet (10 mEq total) by mouth daily. 90 tablet 3  . vitamin B-12 (CYANOCOBALAMIN) 1000 MCG tablet Take 1,000 mcg by mouth daily.    . vitamin C (ASCORBIC ACID) 500 MG tablet Take 500 mg by mouth daily.    . Vitamin D, Ergocalciferol, (DRISDOL) 50000 UNITS CAPS capsule Take 50,000 Units by mouth every 7 (seven) days. Saturday     No current facility-administered medications for this visit.     Allergies  Allergen Reactions  . Atorvastatin Other (See Comments)    Myalgias 10/2011  . Compazine [Prochlorperazine] Other (See Comments)    unknown  . Floxin [Ofloxacin] Other (See Comments)    unknown  . Livalo [Pitavastatin] Other (See Comments)    Leg pain  . Pravastatin Sodium Other (See Comments)    mylgias 09/2011  . Simvastatin Other (See Comments)    Elevated lft's 06/2011  . Fenofibrate Rash    Rash 09/2011    Social History   Social History  . Marital status: Widowed    Spouse name: N/A  . Number of children: N/A  . Years of education: N/A   Occupational History  . Not on file.   Social History Main Topics  . Smoking status: Never Smoker  . Smokeless tobacco: Never Used  . Alcohol use No  . Drug use: No  . Sexual activity: Not on file   Other Topics Concern  . Not on file   Social History Narrative  . No narrative on file    Family History  Problem Relation Age of Onset  . Heart disease Mother   . Heart disease Father     Review of Systems:  As stated in the HPI and otherwise negative.   BP 110/70   Pulse (!) 56   Ht 5\' 2"  (1.575 m)   Wt 187 lb (84.8 kg)   LMP  (LMP Unknown)   SpO2 97%   BMI 34.20 kg/m   Physical Examination: General: Well developed, well nourished, NAD  HEENT: OP clear, mucus membranes moist  SKIN: warm, dry. No rashes. Neuro: No focal  deficits  Musculoskeletal: Muscle strength 5/5 all ext  Psychiatric: Mood and affect normal  Neck: No JVD, no carotid bruits, no thyromegaly, no lymphadenopathy.  Lungs:Clear bilaterally, no wheezes, rhonci, crackles Cardiovascular: Regular rate and rhythm. Soft systolic murmur. No gallops or rubs. Abdomen:Soft. Bowel sounds present. Non-tender.  Extremities: No lower extremity edema. Pulses are 2 + in the bilateral DP/PT.  Echo 08/25/16:  - Left ventricle: The cavity size was normal. There was mild   concentric hypertrophy. Systolic function was normal. The   estimated ejection fraction was in the range of  60% to 65%. Wall   motion was normal; there were no regional wall motion   abnormalities. - Aortic valve: There was mild regurgitation. - Aortic root: The aortic root was normal in size. - Ascending aorta: The ascending aorta was normal in size. - Left atrium: The atrium was mildly dilated. - Right ventricle: Systolic function was normal. - Right atrium: The atrium was normal in size. - Tricuspid valve: There was mild regurgitation. - Pulmonic valve: There was trivial regurgitation. - Pulmonary arteries: Systolic pressure was within the normal   range. - Inferior vena cava: The vessel was normal in size. - Pericardium, extracardiac: There was no pericardial effusion.  Impressions:  - S/P TAVR with an Edwards Sapien 3 THV (size 26 mm). Transaortic   gradients peak/mean 29/16 mmHg slightly higher than a day post   implantation but still within normal limits. Mild paravalvular   leak.   EKG:  EKG is not ordered today. The ekg ordered today demonstrates   Recent Labs: 04/22/2016: NT-Pro BNP 307 07/21/2016: ALT 16 07/28/2016: Magnesium 2.1 07/29/2016: BUN 15; Creatinine, Ser 0.84; Hemoglobin 13.2; Platelets 182; Potassium 4.0; Sodium 136   Lipid Panel No results found for: CHOL, TRIG, HDL, CHOLHDL, VLDL, LDLCALC, LDLDIRECT   Wt Readings from Last 3 Encounters:  08/25/16  187 lb (84.8 kg)  08/05/16 184 lb (83.5 kg)  07/29/16 185 lb 3 oz (84 kg)     Other studies Reviewed: Additional studies/ records that were reviewed today include: . Review of the above records demonstrates:   Assessment and Plan:   1. Severe aortic valve stenosis: She is now one month post TAVR with placement of a 26 mm Sapien 3 valve from the right transfemoral approach. Her groins have no hematoma. She is feeling well today. Dyspnea has improved. She is NYHA class 1. Echo today shows normal LV systolic function with normally functioning bioprosthetic aortic valve. Mean gradient of 16 mmHg. Will continue ASA and Plavix. She would like to change to my practice. I will see her back in 6 months. I will plan echo in one month and I will see her then.    Current medicines are reviewed at length with the patient today.  The patient does not have concerns regarding medicines.  The following changes have been made:  no change  Labs/ tests ordered today include:  No orders of the defined types were placed in this encounter.  Disposition:   FU with me in 6 months  Signed, Lauree Chandler, MD 08/25/2016 2:26 PM    [facility] [address], [place], [place]  [address] Phone: [phone]; Fax: [phone]

## 2016-09-08 ENCOUNTER — Telehealth (HOSPITAL_COMMUNITY): Payer: Self-pay

## 2016-09-08 NOTE — Telephone Encounter (Signed)
I called and left message on voicemail to call office about scheduling for cardiac rehab. I left office contact information on patient voicemail to return call.  ° °

## 2016-09-27 DIAGNOSIS — H5203 Hypermetropia, bilateral: Secondary | ICD-10-CM | POA: Diagnosis not present

## 2016-09-27 DIAGNOSIS — Z961 Presence of intraocular lens: Secondary | ICD-10-CM | POA: Diagnosis not present

## 2016-09-29 ENCOUNTER — Encounter (HOSPITAL_COMMUNITY): Payer: Self-pay

## 2016-10-02 DIAGNOSIS — Z23 Encounter for immunization: Secondary | ICD-10-CM | POA: Diagnosis not present

## 2016-10-07 ENCOUNTER — Telehealth (HOSPITAL_COMMUNITY): Payer: Self-pay

## 2016-10-07 NOTE — Telephone Encounter (Signed)
I called and left message on voicemail to call office about scheduling for cardiac rehab. I left office contact information on patient voicemail to return call.  ° °

## 2016-10-07 NOTE — Telephone Encounter (Signed)
Patient returned call and left message on voicemail. Patient left message that she is not interested in cardiac rehab, she is currently exercising. Referral closed.

## 2016-10-13 ENCOUNTER — Other Ambulatory Visit: Payer: Self-pay | Admitting: Family Medicine

## 2016-10-13 DIAGNOSIS — Z1231 Encounter for screening mammogram for malignant neoplasm of breast: Secondary | ICD-10-CM

## 2016-11-01 ENCOUNTER — Telehealth: Payer: Self-pay | Admitting: Cardiovascular Disease

## 2016-11-01 NOTE — Telephone Encounter (Signed)
Follow Up:; ° ° °Returning your call. °

## 2016-11-01 NOTE — Telephone Encounter (Signed)
Spoke with patient who states she has sinus drainage and she would like to get a medication for her allergies. I reviewed [name]'s advice with her and advised her not to get anything with an added decongestant. She also asks about being in cold temperatures and I advised her to limit lengthy exposure as much as possible but that her condition does not limit her from leaving home when the temperature is at a certain number. She verbalized understanding and thanked me for the call.

## 2016-11-01 NOTE — Telephone Encounter (Signed)
She can take Zyrtec or Claritin for her allergies. I cannot comment on her question related to outdoor temperatures, not sure if a certain doctor has given her specific recommendations on this?

## 2016-11-01 NOTE — Telephone Encounter (Signed)
Left message for patient to call back  

## 2016-11-01 NOTE — Telephone Encounter (Signed)
New message     Patient wants to know what is the best medicine to take for allergies that work well with  Heart medications. Also, wants to know what degree of temperature should should avoid outside.  States she was told not to go outside if less than 40 degrees . Please call.

## 2016-11-02 ENCOUNTER — Ambulatory Visit
Admission: RE | Admit: 2016-11-02 | Discharge: 2016-11-02 | Disposition: A | Discharge status: Home / Self Care | Payer: Medicare Other | Source: Ambulatory Visit | Attending: Family Medicine | Admitting: Family Medicine

## 2016-11-02 DIAGNOSIS — Z1231 Encounter for screening mammogram for malignant neoplasm of breast: Secondary | ICD-10-CM | POA: Diagnosis not present

## 2016-12-16 ENCOUNTER — Telehealth: Payer: Self-pay | Admitting: Cardiovascular Disease

## 2016-12-16 NOTE — Telephone Encounter (Signed)
I spoke with pt. She woke up this morning and right hand was bruised.  Slightly swollen. Not painful. Entire top of hand is bruised. Goes from knuckles to wrist area. She states she did not bump hand on anything.  Has had small spots of bruising in past that go away in a day or so but nothing this large. Is taking ASA 81 mg daily and Clopidogrel 75 mg daily.

## 2016-12-16 NOTE — Telephone Encounter (Signed)
We can have her stop her ASA for now and continue Plavix. If this worsens, will have to reevaluate. Thanks, chris

## 2016-12-16 NOTE — Telephone Encounter (Signed)
New message   Patient calling to report bruising and  swelling in her right hand. States the bruising is getting worse. Patient wants to know if it could be medication related Please call

## 2016-12-16 NOTE — Telephone Encounter (Signed)
I spoke with pt and gave her information from Dr. Angelena Form.  I told her to let us know if bruising worsens or does not improve.

## 2016-12-20 ENCOUNTER — Telehealth: Payer: Self-pay | Admitting: Cardiovascular Disease

## 2016-12-20 DIAGNOSIS — E039 Hypothyroidism, unspecified: Secondary | ICD-10-CM | POA: Diagnosis not present

## 2016-12-20 DIAGNOSIS — R58 Hemorrhage, not elsewhere classified: Secondary | ICD-10-CM | POA: Diagnosis not present

## 2016-12-20 NOTE — Telephone Encounter (Signed)
Agree. Thanks

## 2016-12-20 NOTE — Telephone Encounter (Signed)
Spoke with patient who states the bruising on her hand is better today, even better than from yesterday. She stopped her Aspirin 12/13. She states she does continue to have a bump on the top of her hand near her wrist. She states it is not painful or sore and it is not hard to touch. I advised her to contact her PCP for evaluation of the knot on her hand since her bruising has improved. I advised her to call back if symptoms worsen. She verbalized understanding and agreement and thanked me for the call.

## 2016-12-20 NOTE — Telephone Encounter (Signed)
[name] is calling because she had a valve replacement back on July 22,2018 and her right hand started bruising and she has a knot on her hand

## 2017-02-10 ENCOUNTER — Other Ambulatory Visit: Payer: Self-pay | Admitting: *Deleted

## 2017-02-10 MED ORDER — CLOPIDOGREL BISULFATE 75 MG PO TABS: 75.0000 mg | ORAL_TABLET | Freq: Every day | ORAL | 1 refills | Status: DC

## 2017-02-21 ENCOUNTER — Ambulatory Visit (INDEPENDENT_AMBULATORY_CARE_PROVIDER_SITE_OTHER): Payer: Medicare Other | Admitting: Cardiovascular Disease

## 2017-02-21 ENCOUNTER — Encounter: Payer: Self-pay | Admitting: Cardiovascular Disease

## 2017-02-21 VITALS — BP 130/78 | HR 65 | Ht 62.0 in | Wt 189.8 lb

## 2017-02-21 DIAGNOSIS — E7800 Pure hypercholesterolemia, unspecified: Secondary | ICD-10-CM

## 2017-02-21 DIAGNOSIS — I350 Nonrheumatic aortic (valve) stenosis: Secondary | ICD-10-CM | POA: Diagnosis not present

## 2017-02-21 DIAGNOSIS — Z952 Presence of prosthetic heart valve: Secondary | ICD-10-CM | POA: Diagnosis not present

## 2017-02-21 DIAGNOSIS — I2510 Atherosclerotic heart disease of native coronary artery without angina pectoris: Secondary | ICD-10-CM

## 2017-02-21 DIAGNOSIS — I471 Supraventricular tachycardia: Secondary | ICD-10-CM

## 2017-02-21 NOTE — Progress Notes (Signed)
Chief Complaint  Patient presents with  . Coronary Artery Disease    History of Present Illness: 84 yo female with CAD, HLD, SVT, breast cancer and severe aortic valve stenosis s/p TAVR who is here today for cardiac follow up.  She underwent TAVR on 07/27/16 with placement of a 26 mm Edwards Sapien 3 bioprosthetic AVR from the right femoral artery. Post operative echo showed a normally functioning AVR. She was discharged home on ASA and Plavix. ASA was stopped due to bruising. She had been followed in the past for her CAD and AS by Dr. Radford Pax but has requested to be followed in my office. She is known to have mild CAD by cath in July 2017.   She is here today for follow up. The patient denies any chest pain, dyspnea, palpitations, lower extremity edema, orthopnea, PND, dizziness, near syncope or syncope.   Primary Care Physician: Gaynelle Arabian, MD  Past Medical History:  Diagnosis Date  . Aortic stenosis, severe    a.07/30/16 s/p TAVR   . Breast cancer ([place])    a. s/p lumpectomy, chemo/rad   . Breast cancer, right breast ([place])   . CAD (coronary artery disease), native coronary artery 10/20/2015   a. mild non obst CAD  . CKD (chronic kidney disease), stage III ([place])   . Diastolic dysfunction   . Hypercholesteremia   . Hypothyroidism   . Osteopenia   . PUD (peptic ulcer disease)   . Rheumatic fever   . SVT (supraventricular tachycardia) [facility]) October 2014   converted with vagal maneuver  . Vitamin D deficiency     Past Surgical History:  Procedure Laterality Date  . BREAST SURGERY     RIGHT LUMPECTOMY AND REMOVAL OF LYMPH NODES  . CARDIAC CATHETERIZATION N/A 07/18/2015   Procedure: Right/Left Heart Cath and Coronary Angiography;  Surgeon: [name], MD;  Location: [place] [facility];  Service: Cardiovascular;  Laterality: N/A;  . CATARACT EXTRACTION W/ INTRAOCULAR LENS  IMPLANT, BILATERAL     2017  . CHOLECYSTECTOMY N/A 08/06/2014   Procedure: LAPAROSCOPIC  CHOLECYSTECTOMY ;  Surgeon: Rolm Bookbinder, MD;  Location: [place];  Service: General;  Laterality: N/A;  . REPLACEMENT TOTAL KNEE Right 2009  . TEE WITHOUT CARDIOVERSION N/A 07/27/2016   Procedure: TRANSESOPHAGEAL ECHOCARDIOGRAM (TEE);  Surgeon: [name], MD;  Location: [place];  Service: Open Heart Surgery;  Laterality: N/A;  . THYROID SURGERY     NODULE REMOVED  . TOTAL HIP ARTHROPLASTY Left 2003  . TOTAL HIP ARTHROPLASTY Right 08/28/2013   Procedure: RIGHT TOTAL HIP ARTHROPLASTY ANTERIOR APPROACH;  Surgeon: Mauri Pole, MD;  [facility];  Service: Orthopedics;  Laterality: Right;  . TRANSCATHETER AORTIC VALVE REPLACEMENT, TRANSFEMORAL N/A 07/27/2016   Procedure: TRANSCATHETER AORTIC VALVE REPLACEMENT, TRANSFEMORAL;  Surgeon: [name], MD;  Location: [place];  Service: Open Heart Surgery;  Laterality: N/A;    Current Outpatient Medications  Medication Sig Dispense Refill  . Calcium Carbonate-Vitamin D (CALCIUM 600/VITAMIN D PO) Take 1 tablet by mouth daily.    . clopidogrel (PLAVIX) 75 MG tablet Take 1 tablet (75 mg total) by mouth daily with breakfast. 90 tablet 1  . Coenzyme Q10 (COQ10) 200 MG CAPS Take 200 mg by mouth daily.    . fish oil-omega-3 fatty acids 1000 MG capsule Take 1 g by mouth daily.    . furosemide (LASIX) 20 MG tablet Take 20 mg by mouth.    . lansoprazole (PREVACID) 30 MG capsule  Take 30 mg by mouth daily at 12 noon.     Marland Kitchen levothyroxine (SYNTHROID, LEVOTHROID) 112 MCG tablet Take 112 mcg by mouth daily before breakfast.    . magnesium gluconate (MAGONATE) 500 MG tablet Take 500 mg by mouth 2 (two) times daily.     . Multiple Vitamin (MULTIVITAMIN WITH MINERALS) TABS tablet Take 1 tablet by mouth daily.    . potassium chloride (K-DUR,KLOR-CON) 10 MEQ tablet Take 1 tablet (10 mEq total) by mouth daily. 90 tablet 3  . vitamin B-12 (CYANOCOBALAMIN) 1000 MCG tablet Take 1,000 mcg by mouth daily.    . vitamin C (ASCORBIC ACID) 500 MG tablet  Take 500 mg by mouth daily.    . Vitamin D, Ergocalciferol, (DRISDOL) 50000 UNITS CAPS capsule Take 50,000 Units by mouth every 7 (seven) days. Saturday     No current facility-administered medications for this visit.     Allergies  Allergen Reactions  . Atorvastatin Other (See Comments)    Myalgias 10/2011  . Compazine [Prochlorperazine] Other (See Comments)    unknown  . Floxin [Ofloxacin] Other (See Comments)    unknown  . Livalo [Pitavastatin] Other (See Comments)    Leg pain  . Pravastatin Sodium Other (See Comments)    mylgias 09/2011  . Simvastatin Other (See Comments)    Elevated lft's 06/2011  . Fenofibrate Rash    Rash 09/2011    Social History   Socioeconomic History  . Marital status: Widowed    Spouse name: Not on file  . Number of children: Not on file  . Years of education: Not on file  . Highest education level: Not on file  Social Needs  . Financial resource strain: Not on file  . Food insecurity - worry: Not on file  . Food insecurity - inability: Not on file  . Transportation needs - medical: Not on file  . Transportation needs - non-medical: Not on file  Occupational History  . Not on file  Tobacco Use  . Smoking status: Never Smoker  . Smokeless tobacco: Never Used  Substance and Sexual Activity  . Alcohol use: No  . Drug use: No  . Sexual activity: Not on file  Other Topics Concern  . Not on file  Social History Narrative  . Not on file    Family History  Problem Relation Age of Onset  . Heart disease Mother   . Heart disease Father     Review of Systems:  As stated in the HPI and otherwise negative.   BP 130/78   Pulse 65   Ht 5\' 2"  (1.575 m)   Wt 189 lb 12.8 oz (86.1 kg)   LMP  (LMP Unknown)   SpO2 94%   BMI 34.71 kg/m   Physical Examination:  General: Well developed, well nourished, NAD  HEENT: OP clear, mucus membranes moist  SKIN: warm, dry. No rashes. Neuro: No focal deficits  Musculoskeletal: Muscle strength 5/5 all  ext  Psychiatric: Mood and affect normal  Neck: No JVD, no carotid bruits, no thyromegaly, no lymphadenopathy.  Lungs:Clear bilaterally, no wheezes, rhonci, crackles Cardiovascular: Regular rate and rhythm. Soft systolic murmur.  Abdomen:Soft. Bowel sounds present. Non-tender.  Extremities: No lower extremity edema. Pulses are 2 + in the bilateral DP/PT.  Echo 08/25/16:  - Left ventricle: The cavity size was normal. There was mild   concentric hypertrophy. Systolic function was normal. The   estimated ejection fraction was in the range of 60% to 65%. Wall   motion  was normal; there were no regional wall motion   abnormalities. - Aortic valve: There was mild regurgitation. - Aortic root: The aortic root was normal in size. - Ascending aorta: The ascending aorta was normal in size. - Left atrium: The atrium was mildly dilated. - Right ventricle: Systolic function was normal. - Right atrium: The atrium was normal in size. - Tricuspid valve: There was mild regurgitation. - Pulmonic valve: There was trivial regurgitation. - Pulmonary arteries: Systolic pressure was within the normal   range. - Inferior vena cava: The vessel was normal in size. - Pericardium, extracardiac: There was no pericardial effusion.  Impressions:  - S/P TAVR with an Edwards Sapien 3 THV (size 26 mm). Transaortic   gradients peak/mean 29/16 mmHg slightly higher than a day post   implantation but still within normal limits. Mild paravalvular   leak.   EKG:  EKG is not  ordered today. The ekg ordered today demonstrates   Recent Labs: 04/22/2016: NT-Pro BNP 307 07/21/2016: ALT 16 07/28/2016: Magnesium 2.1 07/29/2016: BUN 15; Creatinine, Ser 0.84; Hemoglobin 13.2; Platelets 182; Potassium 4.0; Sodium 136   Lipid Panel No results found for: CHOL, TRIG, HDL, CHOLHDL, VLDL, LDLCALC, LDLDIRECT   Wt Readings from Last 3 Encounters:  02/21/17 189 lb 12.8 oz (86.1 kg)  08/25/16 187 lb (84.8 kg)  08/05/16 184 lb  (83.5 kg)     Other studies Reviewed: Additional studies/ records that were reviewed today include: . Review of the above records demonstrates:   Assessment and Plan:   1. Severe aortic valve stenosis: She underwent TAVR with placement of a 26 mm Sapien 3 valve from the right transfemoral approach in July 2018 and is doing well. She is on Plavix. She will need one year TAVR echo in July 2019.   2. CAD without angina: She is known to have mild CAD. No chest pain. Continue Plavix and beta blocker.   3. HLD: She is statin intolerant. Followed in primary care. She refuses to reconsider a statin.   4. SVT: no palpitations.   Current medicines are reviewed at length with the patient today.  The patient does not have concerns regarding medicines.  The following changes have been made:  no change  Labs/ tests ordered today include:  No orders of the defined types were placed in this encounter.  Disposition:   FU with me in July 2019 with one year TAVR echo.   Signed, Lauree Chandler, MD 02/21/2017 11:04 AM    [facility] [place], [place], [place]  [address] Phone: [phone]; Fax: [phone]

## 2017-02-21 NOTE — Patient Instructions (Addendum)
Medication Instructions:  Your physician recommends that you continue on your current medications as directed. Please refer to the Current Medication list given to you today.   Labwork: None ordered  Testing/Procedures: Your physician has requested that you have an echocardiogram in July 2019. Echocardiography is a painless test that uses sound waves to create images of your heart. It provides your doctor with information about the size and shape of your heart and how well your heart's chambers and valves are working. This procedure takes approximately one hour. There are no restrictions for this procedure.    Follow-Up: Your physician wants you to follow-up in: 6 months with Bonney Leitz, PA. You will receive a reminder letter in the mail two months in advance. If you don't receive a letter, please call our office to schedule the follow-up appointment.  Your physician wants you to follow-up in: 1 year with Dr. Angelena Form. You will receive a reminder letter in the mail two months in advance. If you don't receive a letter, please call our office to schedule the follow-up appointment.    Any Other Special Instructions Will Be Listed Below (If Applicable).     If you need a refill on your cardiac medications before your next appointment, please call your pharmacy.

## 2017-05-10 DIAGNOSIS — H6983 Other specified disorders of Eustachian tube, bilateral: Secondary | ICD-10-CM | POA: Diagnosis not present

## 2017-05-10 DIAGNOSIS — J302 Other seasonal allergic rhinitis: Secondary | ICD-10-CM | POA: Diagnosis not present

## 2017-05-10 DIAGNOSIS — H6122 Impacted cerumen, left ear: Secondary | ICD-10-CM | POA: Diagnosis not present

## 2017-06-16 DIAGNOSIS — E7800 Pure hypercholesterolemia, unspecified: Secondary | ICD-10-CM | POA: Diagnosis not present

## 2017-06-16 DIAGNOSIS — Z952 Presence of prosthetic heart valve: Secondary | ICD-10-CM | POA: Diagnosis not present

## 2017-06-16 DIAGNOSIS — E559 Vitamin D deficiency, unspecified: Secondary | ICD-10-CM | POA: Diagnosis not present

## 2017-06-16 DIAGNOSIS — N183 Chronic kidney disease, stage 3 (moderate): Secondary | ICD-10-CM | POA: Diagnosis not present

## 2017-06-16 DIAGNOSIS — M899 Disorder of bone, unspecified: Secondary | ICD-10-CM | POA: Diagnosis not present

## 2017-06-16 DIAGNOSIS — M810 Age-related osteoporosis without current pathological fracture: Secondary | ICD-10-CM | POA: Diagnosis not present

## 2017-06-16 DIAGNOSIS — Z853 Personal history of malignant neoplasm of breast: Secondary | ICD-10-CM | POA: Diagnosis not present

## 2017-06-16 DIAGNOSIS — G720 Drug-induced myopathy: Secondary | ICD-10-CM | POA: Diagnosis not present

## 2017-06-16 DIAGNOSIS — E039 Hypothyroidism, unspecified: Secondary | ICD-10-CM | POA: Diagnosis not present

## 2017-06-27 ENCOUNTER — Other Ambulatory Visit: Payer: Self-pay

## 2017-06-27 DIAGNOSIS — I350 Nonrheumatic aortic (valve) stenosis: Secondary | ICD-10-CM

## 2017-06-27 DIAGNOSIS — Z952 Presence of prosthetic heart valve: Secondary | ICD-10-CM

## 2017-07-14 ENCOUNTER — Other Ambulatory Visit: Payer: Self-pay | Admitting: Cardiology

## 2017-07-27 NOTE — Progress Notes (Signed)
[facility]                                       Cardiology Office Note    Date:  07/29/2017   ID:  [name], DOB Aug 27, 1931, MRN [id number]  PCP:  Gaynelle Arabian, MD  Cardiologist:  Dr. Angelena Form  CC: 1 year s/p TAVR  History of Present Illness:  [name] is a 84 y.o. female with a history of  CAD, HLD, SVT, breast cancer and severe AS s/p TAVR (07/2016) who presents to clinic for follow up.   She had been followed in the past for her CAD and AS by Dr. Radford Pax but has since requested to be followed by Dr. Angelena Form. She is known to have mild CAD by cath in July 2017.   She underwent TAVR on 07/27/16 with placement of a 26 mm Edwards Sapien 3 bioprosthetic AVR from the right femoral artery. Post operative echo showed a normally functioning AVR. She was discharged home on ASA and Plavix. ASA was stopped due to bruising. 1 month echo showed transaorticgradients peak/mean 29/16 mmHg slightly higher than a day post implantation but still within normal limits and mild paravalvular leak.  Today she presents to clinic for follow up. No CP or SOB. No LE edema, orthopnea or PND. No dizziness or syncope. No blood in stool or urine. No palpitations. She has had a little cough recently that she thinks is related to allergies. The cough has been going on a couple months. She stays very active planting flowers and house work. She would get short of breath with walking up a flight of stairs.     Past Medical History:  Diagnosis Date  . Aortic stenosis, severe    a.07/30/16 s/p TAVR   . Breast cancer ([place])    a. s/p lumpectomy, chemo/rad   . Breast cancer, right breast ([place])   . CAD (coronary artery disease), native coronary artery 10/20/2015   a. mild non obst CAD  . CKD (chronic kidney disease), stage III ([place])   . Diastolic dysfunction   . Hypercholesteremia   . Hypothyroidism   . Osteopenia   . PUD (peptic ulcer disease)     . Rheumatic fever   . SVT (supraventricular tachycardia) [facility]) October 2014   converted with vagal maneuver  . Vitamin D deficiency     Past Surgical History:  Procedure Laterality Date  . BREAST SURGERY     RIGHT LUMPECTOMY AND REMOVAL OF LYMPH NODES  . CARDIAC CATHETERIZATION N/A 07/18/2015   Procedure: Right/Left Heart Cath and Coronary Angiography;  Surgeon: [name], MD;  Location: [facility] [place] [facility];  Service: Cardiovascular;  Laterality: N/A;  . CATARACT EXTRACTION W/ INTRAOCULAR LENS  IMPLANT, BILATERAL     2017  . CHOLECYSTECTOMY N/A 08/06/2014   Procedure: LAPAROSCOPIC CHOLECYSTECTOMY ;  Surgeon: Rolm Bookbinder, MD;  Location: [place];  Service: General;  Laterality: N/A;  . REPLACEMENT TOTAL KNEE Right 2009  . TEE WITHOUT CARDIOVERSION N/A 07/27/2016   Procedure: TRANSESOPHAGEAL ECHOCARDIOGRAM (TEE);  Surgeon: [name], MD;  Location: [place];  Service: Open Heart Surgery;  Laterality: N/A;  . THYROID SURGERY     NODULE REMOVED  . TOTAL HIP ARTHROPLASTY Left 2003  . TOTAL HIP ARTHROPLASTY Right 08/28/2013   Procedure: RIGHT TOTAL HIP ARTHROPLASTY ANTERIOR APPROACH;  Surgeon: Rodman Key  Marian Sorrow, MD;  [facility];  Service: Orthopedics;  Laterality: Right;  . TRANSCATHETER AORTIC VALVE REPLACEMENT, TRANSFEMORAL N/A 07/27/2016   Procedure: TRANSCATHETER AORTIC VALVE REPLACEMENT, TRANSFEMORAL;  Surgeon: [name], MD;  Location: [facility];  Service: Open Heart Surgery;  Laterality: N/A;    Current Medications: Outpatient Medications Prior to Visit  Medication Sig Dispense Refill  . aspirin EC 81 MG tablet Take 81 mg by mouth daily.    . Calcium Carbonate-Vitamin D (CALCIUM 600/VITAMIN D PO) Take 1 tablet by mouth daily.    . Coenzyme Q10 (COQ10) 200 MG CAPS Take 200 mg by mouth daily.    . fish oil-omega-3 fatty acids 1000 MG capsule Take 1 g by mouth daily.    . furosemide (LASIX) 20 MG tablet Take 20 mg by mouth daily.    . lansoprazole  (PREVACID) 30 MG capsule Take 30 mg by mouth daily at 12 noon.     Marland Kitchen levothyroxine (SYNTHROID, LEVOTHROID) 112 MCG tablet Take 112 mcg by mouth daily before breakfast.    . Magnesium 500 MG TABS Take 1 tablet by mouth 2 (two) times daily. Take 500 mg by mouth 2 (two) times daily.    . Multiple Vitamin (MULTIVITAMIN WITH MINERALS) TABS tablet Take 1 tablet by mouth daily.    . potassium chloride (K-DUR,KLOR-CON) 10 MEQ tablet TAKE 1 TABLET(10 MEQ) BY MOUTH DAILY 90 tablet 2  . vitamin B-12 (CYANOCOBALAMIN) 1000 MCG tablet Take 1,000 mcg by mouth daily.    . vitamin C (ASCORBIC ACID) 500 MG tablet Take 500 mg by mouth daily.    . Vitamin D, Ergocalciferol, (DRISDOL) 50000 UNITS CAPS capsule Take 50,000 Units by mouth every 7 (seven) days. Saturday    . clopidogrel (PLAVIX) 75 MG tablet Take 1 tablet (75 mg total) by mouth daily with breakfast. (Patient not taking: Reported on 07/28/2017) 90 tablet 1  . furosemide (LASIX) 20 MG tablet Take 20 mg by mouth.    . furosemide (LASIX) 20 MG tablet TAKE 1 TABLET(20 MG) BY MOUTH EVERY EVENING (Patient not taking: Reported on 07/28/2017) 90 tablet 2  . magnesium gluconate (MAGONATE) 500 MG tablet Take 500 mg by mouth 2 (two) times daily.      No facility-administered medications prior to visit.      Allergies:   Atorvastatin; Compazine [prochlorperazine]; Floxin [ofloxacin]; Livalo [pitavastatin]; Pravastatin sodium; Simvastatin; and Fenofibrate   Social History   Socioeconomic History  . Marital status: Widowed    Spouse name: Not on file  . Number of children: Not on file  . Years of education: Not on file  . Highest education level: Not on file  Occupational History  . Not on file  Social Needs  . Financial resource strain: Not on file  . Food insecurity:    Worry: Not on file    Inability: Not on file  . Transportation needs:    Medical: Not on file    Non-medical: Not on file  Tobacco Use  . Smoking status: Never Smoker  . Smokeless  tobacco: Never Used  Substance and Sexual Activity  . Alcohol use: No  . Drug use: No  . Sexual activity: Not on file  Lifestyle  . Physical activity:    Days per week: Not on file    Minutes per session: Not on file  . Stress: Not on file  Relationships  . Social connections:    Talks on phone: Not on file    Gets together: Not on  file    Attends religious service: Not on file    Active member of club or organization: Not on file    Attends meetings of clubs or organizations: Not on file    Relationship status: Not on file  Other Topics Concern  . Not on file  Social History Narrative  . Not on file     Family History:  The patient's family history includes Heart disease in her father and mother.      ROS:   Please see the history of present illness.    ROS All other systems reviewed and are negative.   PHYSICAL EXAM:   VS:  BP 140/70   Pulse (!) 56   Ht 5\' 2"  (1.575 m)   Wt 189 lb 6.4 oz (85.9 kg)   LMP  (LMP Unknown)   SpO2 96%   BMI 34.64 kg/m    GEN: Well nourished, well developed, in no acute distress  HEENT: normal  Neck: no JVD, carotid bruits, or masses Cardiac: RRR; 2/5 SEM. No rubs, or gallops,no edema  Respiratory:  clear to auscultation bilaterally, normal work of breathing GI: soft, nontender, nondistended, + BS MS: no deformity or atrophy  Skin: warm and dry, no rash Neuro:  Alert and Oriented x 3, Strength and sensation are intact Psych: euthymic mood, full affect   Wt Readings from Last 3 Encounters:  07/28/17 189 lb 6.4 oz (85.9 kg)  02/21/17 189 lb 12.8 oz (86.1 kg)  08/25/16 187 lb (84.8 kg)     Studies/Labs Reviewed:   EKG:  EKG is not ordered today.    Recent Labs: No results found for requested labs within last 8760 hours.   Lipid Panel No results found for: CHOL, TRIG, HDL, CHOLHDL, VLDL, LDLCALC, LDLDIRECT  Additional studies/ records that were reviewed today include:  [facility]  TAVR OPERATIVE  NOTE   Date of Procedure:                07/27/2016  Preoperative Diagnosis:      Severe Aortic Stenosis  Procedure:        Transcatheter Aortic Valve Replacement - Transfemoral Approach             Edwards Sapien 3 THV (size 26 mm, model # [id number], serial # [id number])              Co-Surgeons:                        Gilford Raid, MD and Lauree Chandler, MD  Pre-operative Echo Findings: ? Severe aortic stenosis ? Normal left ventricular systolic function  Post-operative Echo Findings: ? No paravalvular leak ? Normal left ventricular systolic function  ______________   Echo 08/25/16 (1 month s/p TAVR) - Left ventricle: The cavity size was normal. There was mild concentric hypertrophy. Systolic function was normal. The estimated ejection fraction was in the range of 60% to 65%. Wall motion was normal; there were no regional wall motion abnormalities. - Aortic valve: There was mild regurgitation. - Aortic root: The aortic root was normal in size. - Ascending aorta: The ascending aorta was normal in size. - Left atrium: The atrium was mildly dilated. - Right ventricle: Systolic function was normal. - Right atrium: The atrium was normal in size. - Tricuspid valve: There was mild regurgitation. - Pulmonic valve: There was trivial regurgitation. - Pulmonary arteries: Systolic pressure was within the normal range. - Inferior vena cava: The vessel was  normal in size. - Pericardium, extracardiac: There was no pericardial effusion. Impressions - S/P TAVR with an Edwards Sapien 3 THV (size 26 mm). Transaortic gradients peak/mean 29/16 mmHg slightly higher than a day post implantation but still within normal limits. Mild paravalvular leak.  ______________   Echo 07/28/17 (1 year s/p TAVR) Study Conclusions  - Left ventricle: The cavity size was normal. Wall thickness was   increased in a pattern of mild LVH. Systolic function was normal.   The  estimated ejection fraction was in the range of 55% to 60%.   Wall motion was normal; there were no regional wall motion   abnormalities. Doppler parameters are consistent with high   ventricular filling pressure. - Aortic valve: A bioprosthesis was present. Valve area (VTI): 1.34   cm^2. Valve area (Vmax): 1.22 cm^2. Valve area (Vmean): 1.21   cm^2. - Mitral valve: Calcified annulus. - Left atrium: The atrium was moderately dilated. Impressions: - Normal LV systolic function; elevated LV filling pressure; mild   LVH; s/p AVR with normal mean gradient of 13 mmHg and no AI;   moderate LAE.    ASSESSMENT & PLAN:   Severe AS s/p TAVR: 2D ECHO today shows EF 55% with normally functioning TAVR with improved gradients with a mean gradient of 57mm Hg and no AI. She has NYHA class II symptoms. SBE prophylaxis discussed; her dentist Rx's her Amoxil. Currently on ASA only   CAD: she is known to have mild CAD. No chest pain. Continue plavix and BB.   HLD: she is intolerant to statins  SVT: she has not had any palpitations.   Cough: this does bother her. I offered to do a CXR but she declined this. She will call us back if it gets worse.   Medication Adjustments/Labs and Tests Ordered: Current medicines are reviewed at length with the patient today.  Concerns regarding medicines are outlined above.  Medication changes, Labs and Tests ordered today are listed in the Patient Instructions below. Patient Instructions  Medication Instructions:  Your physician recommends that you continue on your current medications as directed. Please refer to the Current Medication list given to you today.   Labwork: none  Testing/Procedures: none  Follow-Up: Your physician recommends that you schedule a follow-up appointment in: 6 months. Please call our office in about 2 months to schedule this appointment    Any Other Special Instructions Will Be Listed Below (If Applicable).     If you need a  refill on your cardiac medications before your next appointment, please call your pharmacy.      Signed, Angelena Form, PA-C  07/29/2017 8:13 AM    [facility] [place], [place], [place]  [address] Phone: [phone]; Fax: [phone]

## 2017-07-28 ENCOUNTER — Ambulatory Visit (INDEPENDENT_AMBULATORY_CARE_PROVIDER_SITE_OTHER): Payer: Medicare Other | Admitting: Physician Assistant

## 2017-07-28 ENCOUNTER — Ambulatory Visit (HOSPITAL_COMMUNITY): Payer: Medicare Other | Attending: Cardiology

## 2017-07-28 ENCOUNTER — Encounter: Payer: Self-pay | Admitting: Physician Assistant

## 2017-07-28 ENCOUNTER — Other Ambulatory Visit: Payer: Self-pay

## 2017-07-28 VITALS — BP 140/70 | HR 56 | Ht 62.0 in | Wt 189.4 lb

## 2017-07-28 DIAGNOSIS — Z952 Presence of prosthetic heart valve: Secondary | ICD-10-CM

## 2017-07-28 DIAGNOSIS — I2510 Atherosclerotic heart disease of native coronary artery without angina pectoris: Secondary | ICD-10-CM | POA: Insufficient documentation

## 2017-07-28 DIAGNOSIS — Z953 Presence of xenogenic heart valve: Secondary | ICD-10-CM | POA: Insufficient documentation

## 2017-07-28 DIAGNOSIS — I350 Nonrheumatic aortic (valve) stenosis: Secondary | ICD-10-CM

## 2017-07-28 DIAGNOSIS — E7800 Pure hypercholesterolemia, unspecified: Secondary | ICD-10-CM

## 2017-07-28 DIAGNOSIS — Z48812 Encounter for surgical aftercare following surgery on the circulatory system: Secondary | ICD-10-CM | POA: Diagnosis not present

## 2017-07-28 DIAGNOSIS — I517 Cardiomegaly: Secondary | ICD-10-CM | POA: Insufficient documentation

## 2017-07-28 DIAGNOSIS — N189 Chronic kidney disease, unspecified: Secondary | ICD-10-CM | POA: Diagnosis not present

## 2017-07-28 DIAGNOSIS — I471 Supraventricular tachycardia: Secondary | ICD-10-CM

## 2017-07-28 DIAGNOSIS — E785 Hyperlipidemia, unspecified: Secondary | ICD-10-CM | POA: Diagnosis not present

## 2017-07-28 DIAGNOSIS — Z853 Personal history of malignant neoplasm of breast: Secondary | ICD-10-CM | POA: Diagnosis not present

## 2017-07-28 DIAGNOSIS — E039 Hypothyroidism, unspecified: Secondary | ICD-10-CM | POA: Diagnosis not present

## 2017-07-28 NOTE — Patient Instructions (Signed)
Medication Instructions:  Your physician recommends that you continue on your current medications as directed. Please refer to the Current Medication list given to you today.   Labwork: none  Testing/Procedures: none  Follow-Up: Your physician recommends that you schedule a follow-up appointment in: 6 months. Please call our office in about 2 months to schedule this appointment   Any Other Special Instructions Will Be Listed Below (If Applicable).     If you need a refill on your cardiac medications before your next appointment, please call your pharmacy.   

## 2017-08-08 ENCOUNTER — Encounter: Payer: Self-pay | Admitting: Thoracic Surgery (Cardiothoracic Vascular Surgery)

## 2017-08-12 DIAGNOSIS — J029 Acute pharyngitis, unspecified: Secondary | ICD-10-CM | POA: Diagnosis not present

## 2017-08-12 DIAGNOSIS — B349 Viral infection, unspecified: Secondary | ICD-10-CM | POA: Diagnosis not present

## 2017-08-22 DIAGNOSIS — Z96642 Presence of left artificial hip joint: Secondary | ICD-10-CM | POA: Diagnosis not present

## 2017-08-22 DIAGNOSIS — Z96641 Presence of right artificial hip joint: Secondary | ICD-10-CM | POA: Diagnosis not present

## 2017-08-22 DIAGNOSIS — M25562 Pain in left knee: Secondary | ICD-10-CM | POA: Diagnosis not present

## 2017-09-26 ENCOUNTER — Other Ambulatory Visit: Payer: Self-pay | Admitting: Family Medicine

## 2017-09-26 DIAGNOSIS — Z1231 Encounter for screening mammogram for malignant neoplasm of breast: Secondary | ICD-10-CM

## 2017-09-28 DIAGNOSIS — Z961 Presence of intraocular lens: Secondary | ICD-10-CM | POA: Diagnosis not present

## 2017-09-28 DIAGNOSIS — H52203 Unspecified astigmatism, bilateral: Secondary | ICD-10-CM | POA: Diagnosis not present

## 2017-10-04 DIAGNOSIS — Z23 Encounter for immunization: Secondary | ICD-10-CM | POA: Diagnosis not present

## 2017-11-14 ENCOUNTER — Ambulatory Visit
Admission: RE | Admit: 2017-11-14 | Discharge: 2017-11-14 | Disposition: A | Discharge status: Home / Self Care | Payer: Medicare Other | Source: Ambulatory Visit | Attending: Family Medicine | Admitting: Family Medicine

## 2017-11-14 DIAGNOSIS — Z1231 Encounter for screening mammogram for malignant neoplasm of breast: Secondary | ICD-10-CM | POA: Diagnosis not present

## 2017-12-21 DIAGNOSIS — M25511 Pain in right shoulder: Secondary | ICD-10-CM | POA: Diagnosis not present

## 2018-01-31 IMAGING — CT CT HEART MORP W/ CTA COR W/ SCORE W/ CA W/CM &/OR W/O CM
2 of 4 series · 14 of 20 positions shown, 16 images · IV contrast (OMNI 350)
Comparison: None.

CLINICAL DATA: 84-year-old female with severe aortic stenosis.

EXAM:
Cardiac TAVR CT
TECHNIQUE: The patient was scanned on a Philips 256 scanner. A 120 kV
retrospective scan was triggered in the descending thoracic aorta at
111 HU's. Gantry rotation speed was 270 msecs and collimation was .9
mm. 5 mg of IV Metoprolol and no nitro were given. The 3D data set
was reconstructed in 5% intervals of the R-R cycle. Systolic and
diastolic phases were analyzed on a dedicated work station using
MPR, MIP and VRT modes. The patient received 80 cc of contrast.

[Series 5: corcta 0.6 b30f bestsyst 35 % · axial · 0.41mm/px · z∈[+1433,+1576]mm · 7 of 638 slices shown, 9 images]
[im 80/638  vessel]
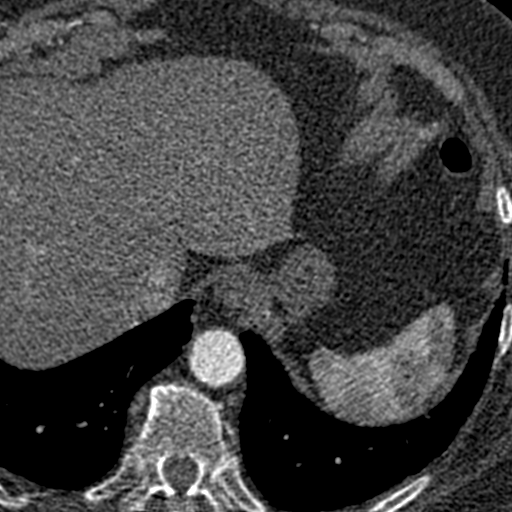
[im 80/638  lung]
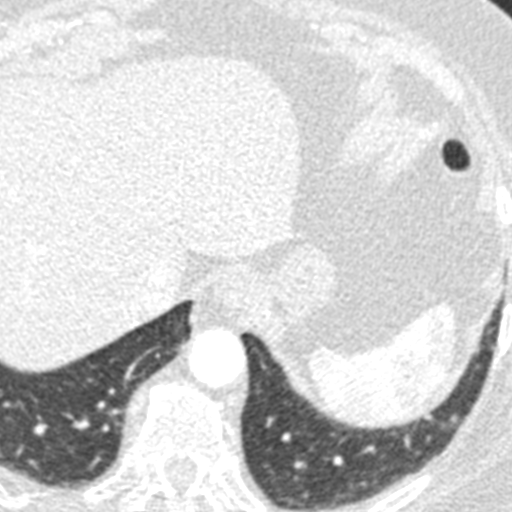
[im 160/638  vessel]
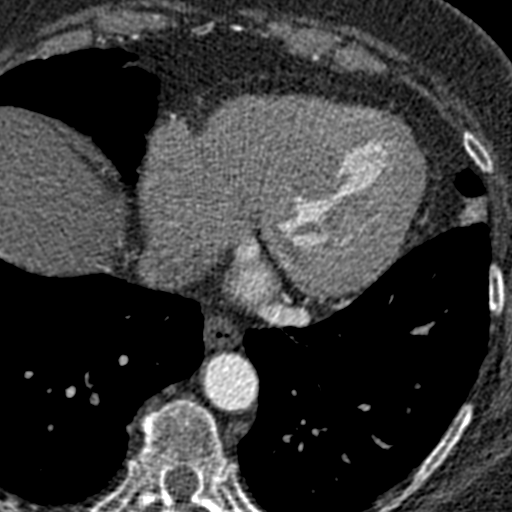
[im 239/638  vessel]
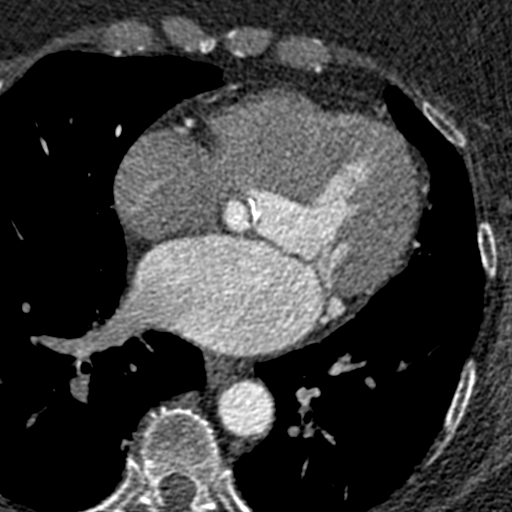
[im 319/638  vessel]
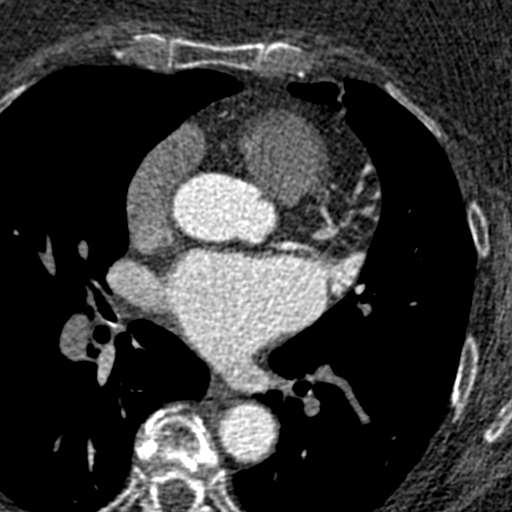
[im 399/638  vessel]
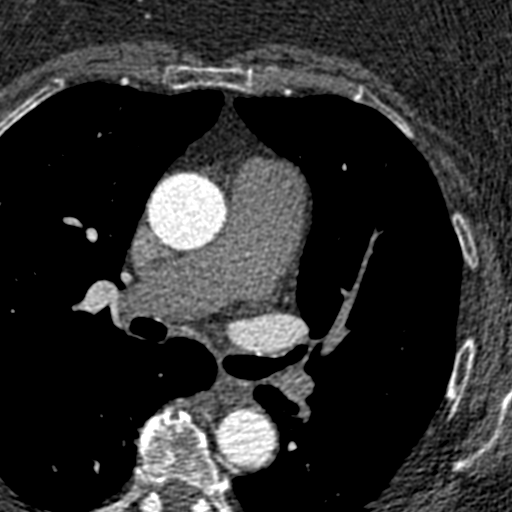
[im 399/638  lung]
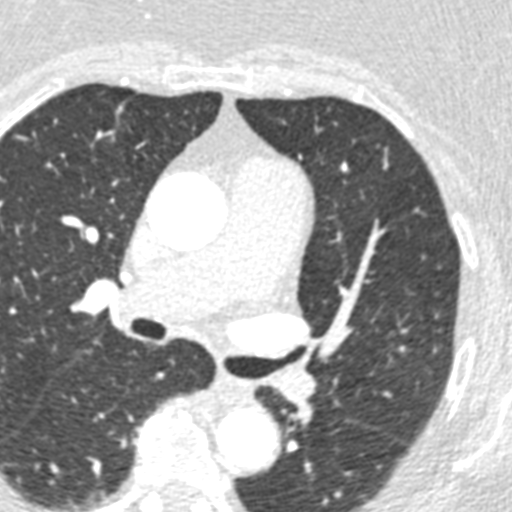
[im 478/638  vessel]
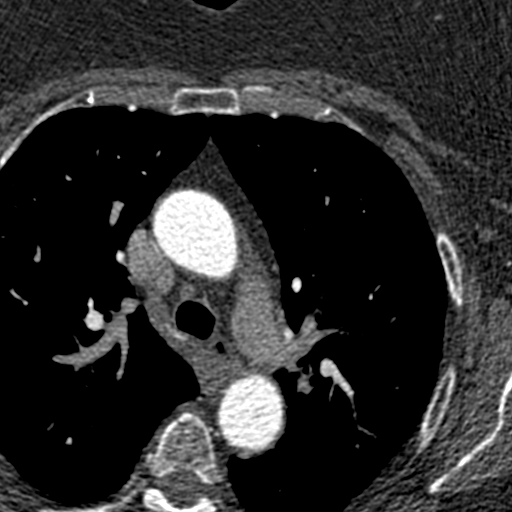
[im 558/638  vessel]
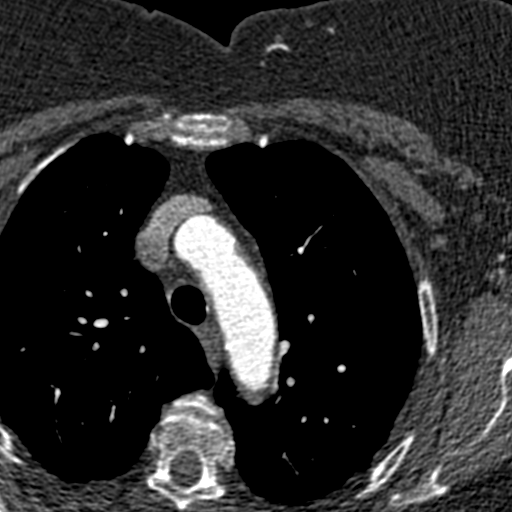

[Series 6: corcta 0.6 b26f bestdiast 72 % · axial · 0.41mm/px · z∈[+1433,+1576]mm · 7 of 638 slices shown]
[im 80/638  vessel]
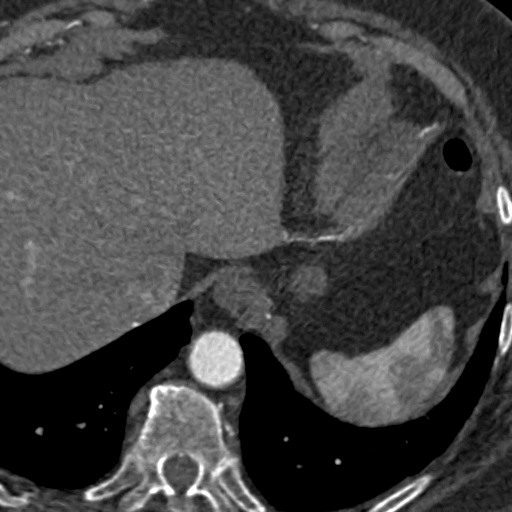
[im 160/638  vessel]
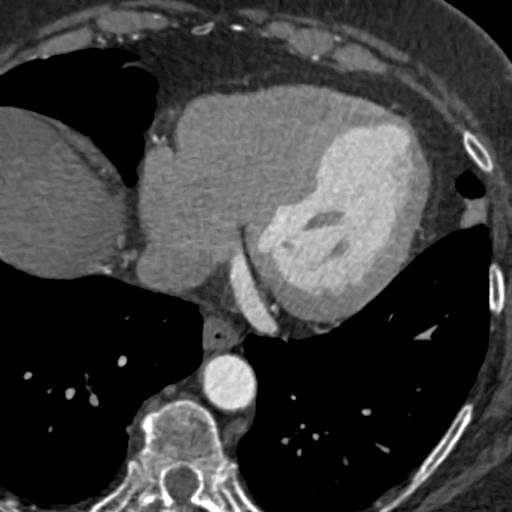
[im 239/638  vessel]
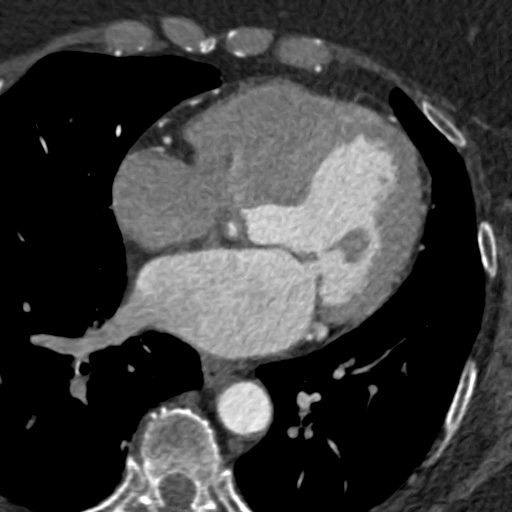
[im 319/638  vessel]
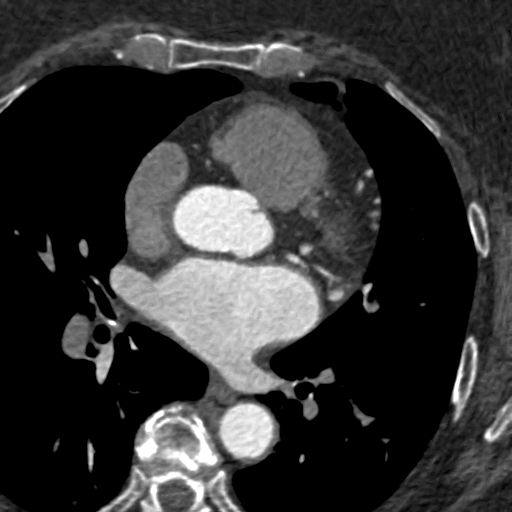
[im 399/638  vessel]
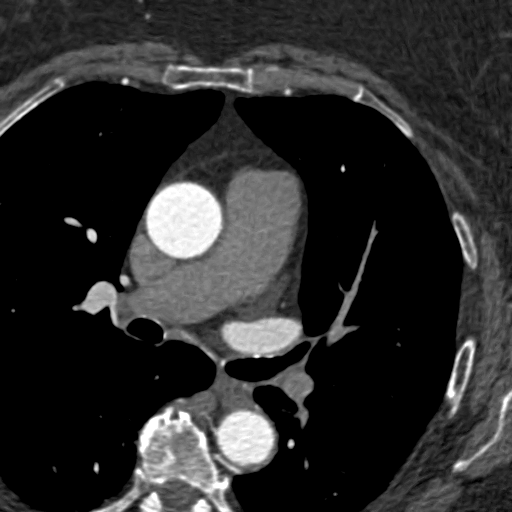
[im 478/638  vessel]
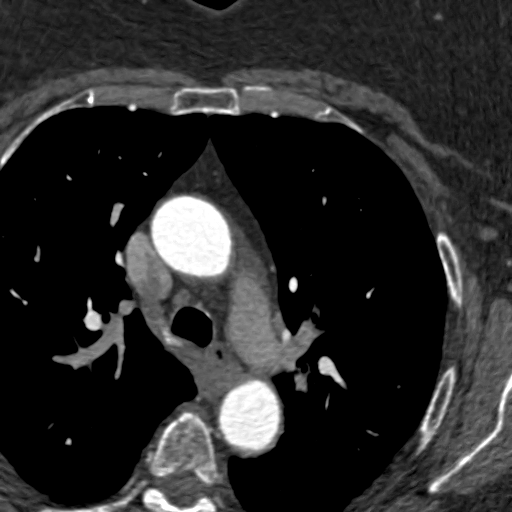
[im 558/638  vessel]
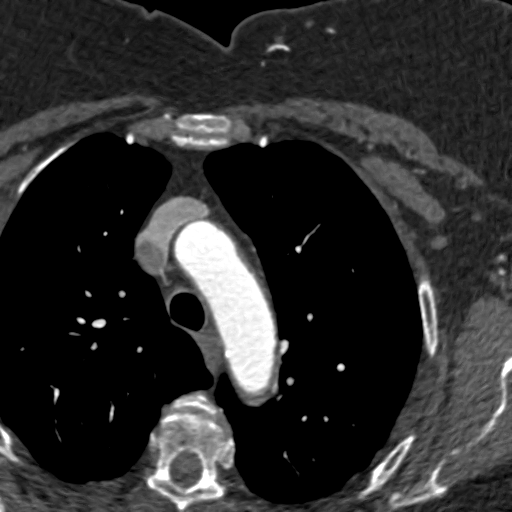

[14 of 20 positions shown; findings below may reference images not displayed]

FINDINGS: Aortic Valve: Trileaflet, severely thickened and calcified aortic
valve with severely restricted leaflet opening. There are asymmetric
calcifications predominantly in the non-coronary cusp extending into
the LVOT.

Aorta:  Normal size, no dissection.  Mild diffuse calcifications.

Sinotubular Junction:  27 x 25 mm

Ascending Thoracic Aorta:  31 x 30 mm

Aortic Arch:  26 x 26 mm

Descending Thoracic Aorta:  23 x 22 mm

Sinus of Valsalva Measurements:

Non-coronary:  30 mm

Right -coronary:  29 mm

Left -coronary:  30 mm

Coronary Artery Height above Annulus:

Left Main:  14 mm

Right Coronary:  16 mm

Virtual Basal Annulus Measurements:

Maximum/Minimum Diameter:  27 x 21 mm

Perimeter:  77 mm

Area:  447 mm2

Optimum Fluoroscopic Angle for Delivery:  LAO 6 AUJLA 6
IMPRESSION: 1. Trileaflet, severely thickened and calcified aortic valve with
severely restricted leaflet opening. There are asymmetric
calcifications predominantly in the non-coronary cusp extending into
the LVOT. Annular measurements suitable for delivery of a 26 mm
[place]-SAPIEN 3 valve.

2. Sufficient annulus to coronary distance.

3. Optimum Fluoroscopic Angle for Delivery:  LAO 6 AUJLA 6

4. No thrombus in the left atrial appendage.

Bo Rule

EXAM:
OVER-READ INTERPRETATION  CT CHEST

The following report is an over-read performed by radiologist Dr.
Zida Waty [[email or web address]] on 06/30/2016. This over-read
does not include interpretation of cardiac or coronary anatomy or
pathology. The coronary CTA interpretation by the cardiologist is
attached.
FINDINGS: Cardiovascular: Heart is upper limits normal in size. Aorta is
normal caliber.

Mediastinum/Nodes: No mediastinal, hilar, or axillary adenopathy.
Trachea and esophagus are unremarkable.

Lungs/Pleura: No confluent airspace opacities or effusions.

Upper Abdomen: Imaging into the upper abdomen shows no acute
findings.

Musculoskeletal: Chest wall soft tissues are unremarkable. No acute
bony abnormality.
IMPRESSION: No acute or significant extracardiac abnormality.

## 2018-01-31 IMAGING — CT CT CTA ABD/PEL W/CM AND/OR W/O CM
1 of 7 series · 2 of 46 positions shown, 5 images · IV contrast (OMNI 350)
Comparison: CT the abdomen and pelvis 06/09/2004. Chest CT
03/17/2005.

CLINICAL DATA: 84-year-old female with history of severe aortic
stenosis. Preprocedural study prior to potential transcatheter
aortic valve replacement (TAVR) procedure.

EXAM:
CT ANGIOGRAPHY CHEST, ABDOMEN AND PELVIS
TECHNIQUE: Multidetector CT imaging through the chest, abdomen and pelvis was
performed using the standard protocol during bolus administration of
intravenous contrast. Multiplanar reconstructed images and MIPs were
obtained and reviewed to evaluate the vascular anatomy.
CONTRAST:  150 mL of Isovue 370.

[Series 7: lung · axial · 0.92mm/px · z∈[+1472,+1566]mm · 2 of 143 slices shown, 5 images]
[im 48/143  soft-tissue]
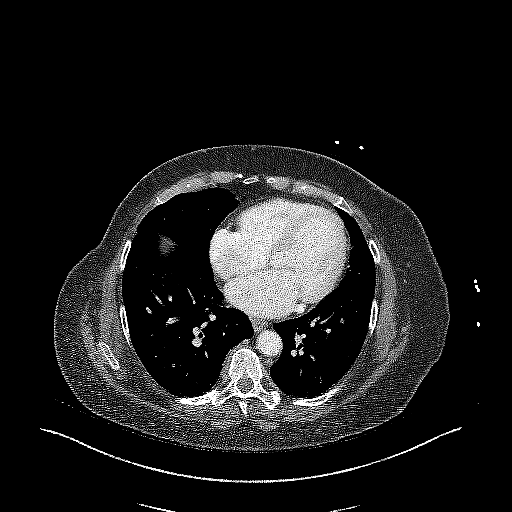
[im 48/143  lung]
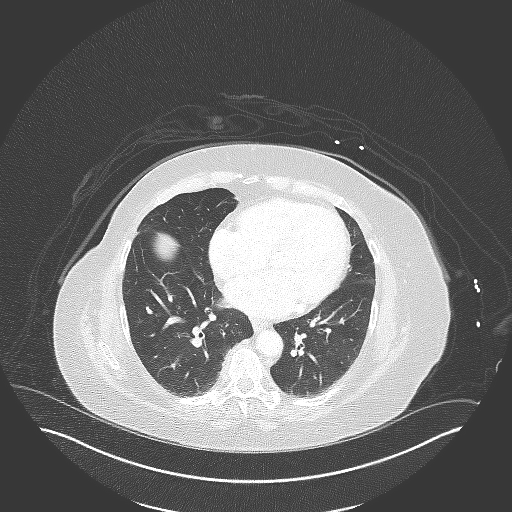
[im 48/143  bone]
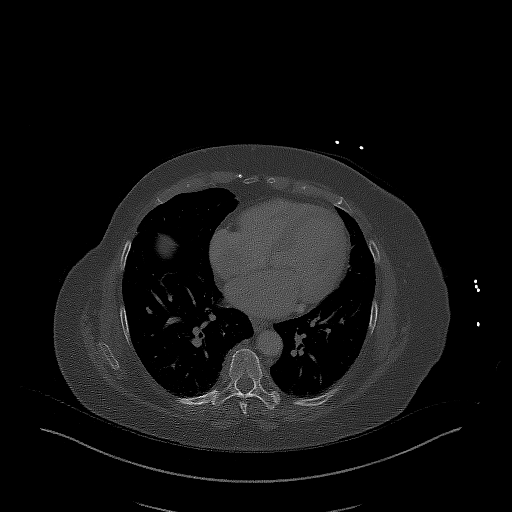
[im 95/143  soft-tissue]
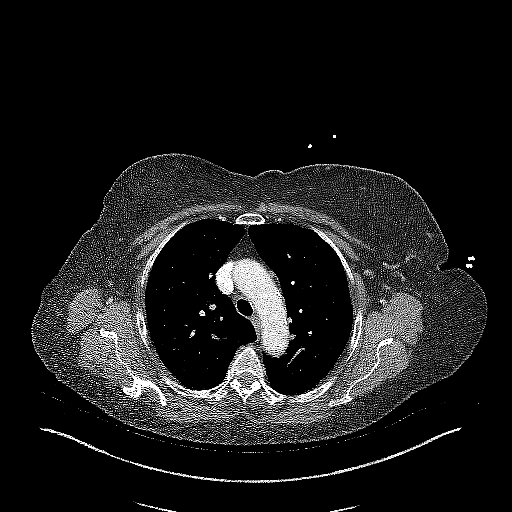
[im 95/143  lung]
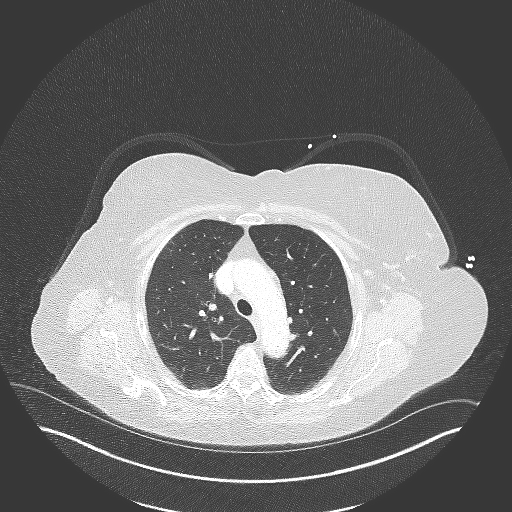

[2 of 46 positions shown; findings below may reference images not displayed]

FINDINGS: CTA CHEST FINDINGS

Cardiovascular: Heart size is mildly enlarged with left atrial
dilatation. There is no significant pericardial fluid, thickening or
pericardial calcification. There is aortic atherosclerosis, as well
as atherosclerosis of the great vessels of the mediastinum and the
coronary arteries, including calcified atherosclerotic plaque in the
left main and left anterior descending coronary arteries.
Calcifications of the aortic valve. Mild calcifications of the
mitral annulus.

Mediastinum/Lymph Nodes: No pathologically enlarged mediastinal or
hilar lymph nodes. Esophagus is unremarkable in appearance. No
axillary lymphadenopathy.

Lungs/Pleura: 8 x 4 mm nodule in the right middle lobe associated
with the minor fissure (axial image 75 of series 7), stable compared
to remote prior study from 7558, considered definitively benign
(presumably a subpleural lymph node). No acute consolidative
airspace disease. No pleural effusions.

Musculoskeletal/Soft Tissues: There are no aggressive appearing
lytic or blastic lesions noted in the visualized portions of the
skeleton.

CTA ABDOMEN AND PELVIS FINDINGS

Hepatobiliary: No cystic or solid hepatic lesions. No intra or
extrahepatic biliary ductal dilatation. Status post cholecystectomy.

Pancreas: No pancreatic mass. No pancreatic ductal dilatation. No
pancreatic or peripancreatic fluid or inflammatory changes.

Spleen: Unremarkable.

Adrenals/Urinary Tract: Bilateral kidneys and bilateral adrenal
glands are normal in appearance. No hydroureteronephrosis. Urinary
bladder is largely obscured by extensive beam hardening artifact
from the patient's bilateral hip arthroplasties.

Stomach/Bowel: The appearance of the stomach is normal. There is no
pathologic dilatation of small bowel or colon. The appendix is not
confidently identified and may be surgically absent. Regardless,
there are no inflammatory changes noted adjacent to the cecum to
suggest the presence of an acute appendicitis at this time.

Vascular/Lymphatic: Aortic atherosclerosis with vascular findings
and measurements pertinent to potential TAVR procedure, as detailed
below. Portions of the common femoral arteries are obscured by beam
hardening artifact, as discussed below, and are considered
uninterpretable. No aneurysm or dissection noted in the abdominal or
pelvic vasculature. Celiac axis, superior mesenteric artery and
inferior mesenteric artery all appear patent without hemodynamically
significant stenosis. Single renal arteries bilaterally are patent
without hemodynamically significant stenosis. No lymphadenopathy
noted in the abdomen or pelvis.

Reproductive: 3.3 x 2.8 x 2.0 cm mass in the fundus of the uterus,
nonspecific, but likely to represent a fibroid. Ovaries are
atrophic.

Other: Subtle fat containing mass in the retroperitoneum immediately
adjacent to the inferior aspect of the pancreatic head and uncinate
process measuring approximately 3.4 x 2.4 x 3.7 cm (axial image 161
of series 6 and coronal image 69 of series 9), increased in size
compared to remote prior examination from 06/09/2004 at which point
this lesion measured 2.1 x 1.6 cm on axial images. No significant
volume of ascites. No pneumoperitoneum.

Musculoskeletal: Status post bilateral total hip arthroplasty. There
are no aggressive appearing lytic or blastic lesions noted in the
visualized portions of the skeleton.

VASCULAR MEASUREMENTS PERTINENT TO TAVR:

AORTA:

Minimal Aortic Diameter -  15 x 15 mm

Severity of Aortic Calcification -  moderate

RIGHT PELVIS:

Right Common Iliac Artery -

Minimal Diameter - 9.3 x 8.3 mm

Tortuosity - mild

Calcification - mild

Right External Iliac Artery -

Minimal Diameter - 6.4 x 6.1 mm

Tortuosity - mild

Calcification - none

Right Common Femoral Artery -

Minimal Diameter - uninterpretable secondary to artifact

Tortuosity - mild

Calcification - mild

LEFT PELVIS:

Left Common Iliac Artery -

Minimal Diameter - 10.1 x 8.4 mm

Tortuosity - mild

Calcification - mild

Left External Iliac Artery -

Minimal Diameter - 6.5 x 6.2 mm

Tortuosity - mild

Calcification - none

Left Common Femoral Artery -

Minimal Diameter - uninterpretable secondary to artifact.

Tortuosity - mild

Calcification - mild

Review of the MIP images confirms the above findings.
IMPRESSION: 1. Vascular findings and measurements pertinent to potential TAVR
procedure, as detailed above. This patient appears likely to have
suitable pelvic arterial access bilaterally, however, secondary to
extensive beam hardening artifact from the patient's bilateral total
hip arthroplasties, portions of the common femoral arteries are
completely obscured and cannot be accurately evaluated on today's
examination. However, given the minimal tortuosity in the pelvic
vasculature and general mild degree of atherosclerosis throughout
the remaining vasculature, it is highly likely that these common
femoral arteries are suitable for access. This could be confirmed
with catheter arteriography if clinically appropriate.
2. Calcifications of the aortic valve, compatible with the reported
clinical history of severe aortic stenosis.
3. Fatty attenuation mass in the retroperitoneum immediately
inferior to the pancreatic head and uncinate process measuring 3.4 x
2.4 x 3.7 cm which has slowly grown compared to remote prior
examination from 5447. Statistically, this is likely to represent a
retroperitoneal lipoma. A slow-growing low-grade retroperitoneal
liposarcoma is not favored at this time, but is not entirely
excluded, and attention on repeat CT of the abdomen and pelvis is
suggested in 12 months to ensure stability.
4. Cardiomegaly with left atrial dilatation.
5. Additional incidental findings, as above.

## 2018-02-09 DIAGNOSIS — M25562 Pain in left knee: Secondary | ICD-10-CM | POA: Diagnosis not present

## 2018-02-13 ENCOUNTER — Ambulatory Visit (INDEPENDENT_AMBULATORY_CARE_PROVIDER_SITE_OTHER): Payer: Medicare Other | Admitting: Cardiovascular Disease

## 2018-02-13 VITALS — BP 140/78 | HR 60 | Ht 62.0 in | Wt 182.1 lb

## 2018-02-13 DIAGNOSIS — E7800 Pure hypercholesterolemia, unspecified: Secondary | ICD-10-CM

## 2018-02-13 DIAGNOSIS — I2510 Atherosclerotic heart disease of native coronary artery without angina pectoris: Secondary | ICD-10-CM | POA: Diagnosis not present

## 2018-02-13 DIAGNOSIS — I471 Supraventricular tachycardia: Secondary | ICD-10-CM | POA: Diagnosis not present

## 2018-02-13 DIAGNOSIS — I350 Nonrheumatic aortic (valve) stenosis: Secondary | ICD-10-CM

## 2018-02-13 NOTE — Patient Instructions (Signed)
Medication Instructions:  Your physician recommends that you continue on your current medications as directed. Please refer to the Current Medication list given to you today.  If you need a refill on your cardiac medications before your next appointment, please call your pharmacy.   Lab work: none If you have labs (blood work) drawn today and your tests are completely normal, you will receive your results only by: . MyChart Message (if you have MyChart) OR . A paper copy in the mail If you have any lab test that is abnormal or we need to change your treatment, we will call you to review the results.  Testing/Procedures: none  Follow-Up: At [facility], you and your health needs are our priority.  As part of our continuing mission to provide you with exceptional heart care, we have created designated Provider Care Teams.  These Care Teams include your primary Cardiologist (physician) and Advanced Practice Providers (APPs -  Physician Assistants and Nurse Practitioners) who all work together to provide you with the care you need, when you need it. You will need a follow up appointment in 12 months.  Please call our office 4 months in advance to schedule this appointment.  You may see Christopher McAlhany, MD or one of the following Advanced Practice Providers on your designated Care Team:   Brittainy Simmons, PA-C Dayna Dunn, PA-C . Michele Lenze, PA-C  Any Other Special Instructions Will Be Listed Below (If Applicable).    

## 2018-02-13 NOTE — Progress Notes (Signed)
Chief Complaint  Patient presents with  . Follow-up    CAD    History of Present Illness: 85 yo female with CAD, HLD, SVT, breast cancer and severe aortic valve stenosis s/p TAVR who is here today for cardiac follow up.  Cardiac cath in July 2017 with mild plaque in the LAD. She underwent TAVR on 07/27/16 with placement of a 26 mm [place] Sapien 3 bioprosthetic AVR from the right femoral artery. Post operative echo showed a normally functioning AVR. She had been followed in the past for her CAD and AS by Dr. Radford Pax. She has been on ASA.   She is here today for follow up. The patient denies any chest pain, dyspnea, palpitations, lower extremity edema, orthopnea, PND, dizziness, near syncope or syncope. She is very active and is feeling great.     Primary Care Physician: Gaynelle Arabian, MD  Past Medical History:  Diagnosis Date  . Aortic stenosis, severe    a.07/30/16 s/p TAVR   . Breast cancer ([place])    a. s/p lumpectomy, chemo/rad   . Breast cancer, right breast ([place])   . CAD (coronary artery disease), native coronary artery 10/20/2015   a. mild non obst CAD  . CKD (chronic kidney disease), stage III ([place])   . Diastolic dysfunction   . Hypercholesteremia   . Hypothyroidism   . Osteopenia   . PUD (peptic ulcer disease)   . Rheumatic fever   . SVT (supraventricular tachycardia) [facility]) October 2014   converted with vagal maneuver  . Vitamin D deficiency     Past Surgical History:  Procedure Laterality Date  . BREAST SURGERY     RIGHT LUMPECTOMY AND REMOVAL OF LYMPH NODES  . CARDIAC CATHETERIZATION N/A 07/18/2015   Procedure: Right/Left Heart Cath and Coronary Angiography;  Surgeon: [name], MD;  Location: [place] [facility];  Service: Cardiovascular;  Laterality: N/A;  . CATARACT EXTRACTION W/ INTRAOCULAR LENS  IMPLANT, BILATERAL     2017  . CHOLECYSTECTOMY N/A 08/06/2014   Procedure: LAPAROSCOPIC CHOLECYSTECTOMY ;  Surgeon: Rolm Bookbinder, MD;  Location: [place];  Service: General;  Laterality: N/A;  . REPLACEMENT TOTAL KNEE Right 2009  . TEE WITHOUT CARDIOVERSION N/A 07/27/2016   Procedure: TRANSESOPHAGEAL ECHOCARDIOGRAM (TEE);  Surgeon: [name], MD;  Location: [place];  Service: Open Heart Surgery;  Laterality: N/A;  . THYROID SURGERY     NODULE REMOVED  . TOTAL HIP ARTHROPLASTY Left 2003  . TOTAL HIP ARTHROPLASTY Right 08/28/2013   Procedure: RIGHT TOTAL HIP ARTHROPLASTY ANTERIOR APPROACH;  Surgeon: Mauri Pole, MD;  Location: [facility];  Service: Orthopedics;  Laterality: Right;  . TRANSCATHETER AORTIC VALVE REPLACEMENT, TRANSFEMORAL N/A 07/27/2016   Procedure: TRANSCATHETER AORTIC VALVE REPLACEMENT, TRANSFEMORAL;  Surgeon: [name], MD;  Location: [place];  Service: Open Heart Surgery;  Laterality: N/A;    Current Outpatient Medications  Medication Sig Dispense Refill  . ascorbic acid (VITAMIN C) 500 MG tablet Take 1 tablet by mouth daily.    Marland Kitchen aspirin EC 81 MG tablet Take 81 mg by mouth daily.    . Calcium Carbonate-Vitamin D (CALCIUM 600/VITAMIN D PO) Take 1 tablet by mouth daily.    . Coenzyme Q10 (COQ10) 200 MG CAPS Take 200 mg by mouth daily.    . ergocalciferol (VITAMIN D2) 1.25 MG (50000 UT) capsule Take 1 capsule by mouth once a week.    . fish oil-omega-3 fatty acids 1000 MG capsule Take 1 g by mouth  daily.    . furosemide (LASIX) 20 MG tablet Take 20 mg by mouth daily.    . lansoprazole (PREVACID) 30 MG capsule Take 30 mg by mouth daily at 12 noon.     Marland Kitchen levothyroxine (SYNTHROID, LEVOTHROID) 112 MCG tablet Take 112 mcg by mouth daily before breakfast.    . magic mouthwash SOLN Take 1-2 Squirts by mouth every 4 (four) hours as needed.    . Magnesium 500 MG TABS Take 1 tablet by mouth 2 (two) times daily. Take 500 mg by mouth 2 (two) times daily.    . Menthol, Topical Analgesic, (BIOFREEZE EX) Apply 1 application topically as needed.    . Multiple Vitamin (MULTIVITAMIN WITH MINERALS) TABS tablet Take 1  tablet by mouth daily.    . potassium chloride (K-DUR,KLOR-CON) 10 MEQ tablet TAKE 1 TABLET(10 MEQ) BY MOUTH DAILY 90 tablet 2  . vitamin B-12 (CYANOCOBALAMIN) 1000 MCG tablet Take 1,000 mcg by mouth daily.    . vitamin C (ASCORBIC ACID) 500 MG tablet Take 500 mg by mouth daily.    . Vitamin D, Ergocalciferol, (DRISDOL) 50000 UNITS CAPS capsule Take 50,000 Units by mouth every 7 (seven) days. Saturday     No current facility-administered medications for this visit.     Allergies  Allergen Reactions  . Atorvastatin Other (See Comments)    Myalgias 10/2011  . Compazine [Prochlorperazine] Other (See Comments)    unknown  . Floxin [Ofloxacin] Other (See Comments)    unknown  . Livalo [Pitavastatin] Other (See Comments)    Leg pain  . Pravastatin Sodium Other (See Comments)    mylgias 09/2011  . Simvastatin Other (See Comments)    Elevated lft's 06/2011  . Fenofibrate Rash    Rash 09/2011    Social History   Socioeconomic History  . Marital status: Widowed    Spouse name: Not on file  . Number of children: Not on file  . Years of education: Not on file  . Highest education level: Not on file  Occupational History  . Not on file  Social Needs  . Financial resource strain: Not on file  . Food insecurity:    Worry: Not on file    Inability: Not on file  . Transportation needs:    Medical: Not on file    Non-medical: Not on file  Tobacco Use  . Smoking status: Never Smoker  . Smokeless tobacco: Never Used  Substance and Sexual Activity  . Alcohol use: No  . Drug use: No  . Sexual activity: Not on file  Lifestyle  . Physical activity:    Days per week: Not on file    Minutes per session: Not on file  . Stress: Not on file  Relationships  . Social connections:    Talks on phone: Not on file    Gets together: Not on file    Attends religious service: Not on file    Active member of club or organization: Not on file    Attends meetings of clubs or organizations: Not on  file    Relationship status: Not on file  . Intimate partner violence:    Fear of current or ex partner: Not on file    Emotionally abused: Not on file    Physically abused: Not on file    Forced sexual activity: Not on file  Other Topics Concern  . Not on file  Social History Narrative  . Not on file    Family History  Problem  Relation Age of Onset  . Heart disease Mother   . Heart disease Father     Review of Systems:  As stated in the HPI and otherwise negative.   BP 140/78   Pulse 60   Ht 5\' 2"  (1.575 m)   Wt 182 lb 1.9 oz (82.6 kg)   LMP  (LMP Unknown)   SpO2 97%   BMI 33.31 kg/m   Physical Examination:  General: Well developed, well nourished, NAD  HEENT: OP clear, mucus membranes moist  SKIN: warm, dry. No rashes. Neuro: No focal deficits  Musculoskeletal: Muscle strength 5/5 all ext  Psychiatric: Mood and affect normal  Neck: No JVD, no carotid bruits, no thyromegaly, no lymphadenopathy.  Lungs:Clear bilaterally, no wheezes, rhonci, crackles Cardiovascular: Regular rate and rhythm. No murmurs, gallops or rubs. Abdomen:Soft. Bowel sounds present. Non-tender.  Extremities: No lower extremity edema. Pulses are 2 + in the bilateral DP/PT.  Echo 07/28/17: - Left ventricle: The cavity size was normal. Wall thickness was   increased in a pattern of mild LVH. Systolic function was normal.   The estimated ejection fraction was in the range of 55% to 60%.   Wall motion was normal; there were no regional wall motion   abnormalities. Doppler parameters are consistent with high   ventricular filling pressure. - Aortic valve: A bioprosthesis was present. Valve area (VTI): 1.34   cm^2. Valve area (Vmax): 1.22 cm^2. Valve area (Vmean): 1.21   cm^2. - Mitral valve: Calcified annulus. - Left atrium: The atrium was moderately dilated.   EKG:  EKG is ordered today. The ekg ordered today demonstrates NSR, rate 60 bpm.   Recent Labs: No results found for requested labs  within last 8760 hours.   Lipid Panel No results found for: CHOL, TRIG, HDL, CHOLHDL, VLDL, LDLCALC, LDLDIRECT   Wt Readings from Last 3 Encounters:  02/13/18 182 lb 1.9 oz (82.6 kg)  07/28/17 189 lb 6.4 oz (85.9 kg)  02/21/17 189 lb 12.8 oz (86.1 kg)     Other studies Reviewed: Additional studies/ records that were reviewed today include: . Review of the above records demonstrates:   Assessment and Plan:   1. Severe aortic valve stenosis: She underwent TAVR with placement of a 26 mm Sapien 3 valve from the right transfemoral approach in July 2018. Her valve is working well by echo in July 2019 with mild gradient and no AI. Continue ASA and SBE propylaxis.    2. CAD without angina: She is known to have mild CAD by cath in 2017. No chest pain. Continue ASA.    3. HLD: She is statin intolerant. Followed in primary care. She refuses to reconsider a statin.   4. SVT: She has no palpitations.   Current medicines are reviewed at length with the patient today.  The patient does not have concerns regarding medicines.  The following changes have been made:  no change  Labs/ tests ordered today include:   Orders Placed This Encounter  Procedures  . EKG 12-Lead   Disposition:   FU with me in 12 months  Signed, Lauree Chandler, MD 02/13/2018 8:53 AM    [facility] [address], [place], [place]  [address] Phone: [phone]; Fax: [phone]

## 2018-02-15 DIAGNOSIS — D485 Neoplasm of uncertain behavior of skin: Secondary | ICD-10-CM | POA: Diagnosis not present

## 2018-02-15 DIAGNOSIS — L821 Other seborrheic keratosis: Secondary | ICD-10-CM | POA: Diagnosis not present

## 2018-02-15 DIAGNOSIS — D225 Melanocytic nevi of trunk: Secondary | ICD-10-CM | POA: Diagnosis not present

## 2018-02-15 DIAGNOSIS — B079 Viral wart, unspecified: Secondary | ICD-10-CM | POA: Diagnosis not present

## 2018-02-21 IMAGING — CR DG CHEST 2V
2 series · 2 of 2 positions shown · non-contrast
Comparison: 08/05/2014 chest radiograph

CLINICAL DATA: 84 y/o  F; severe aortic stenosis.

EXAM:
CHEST  2 VIEW

[w chest pa]
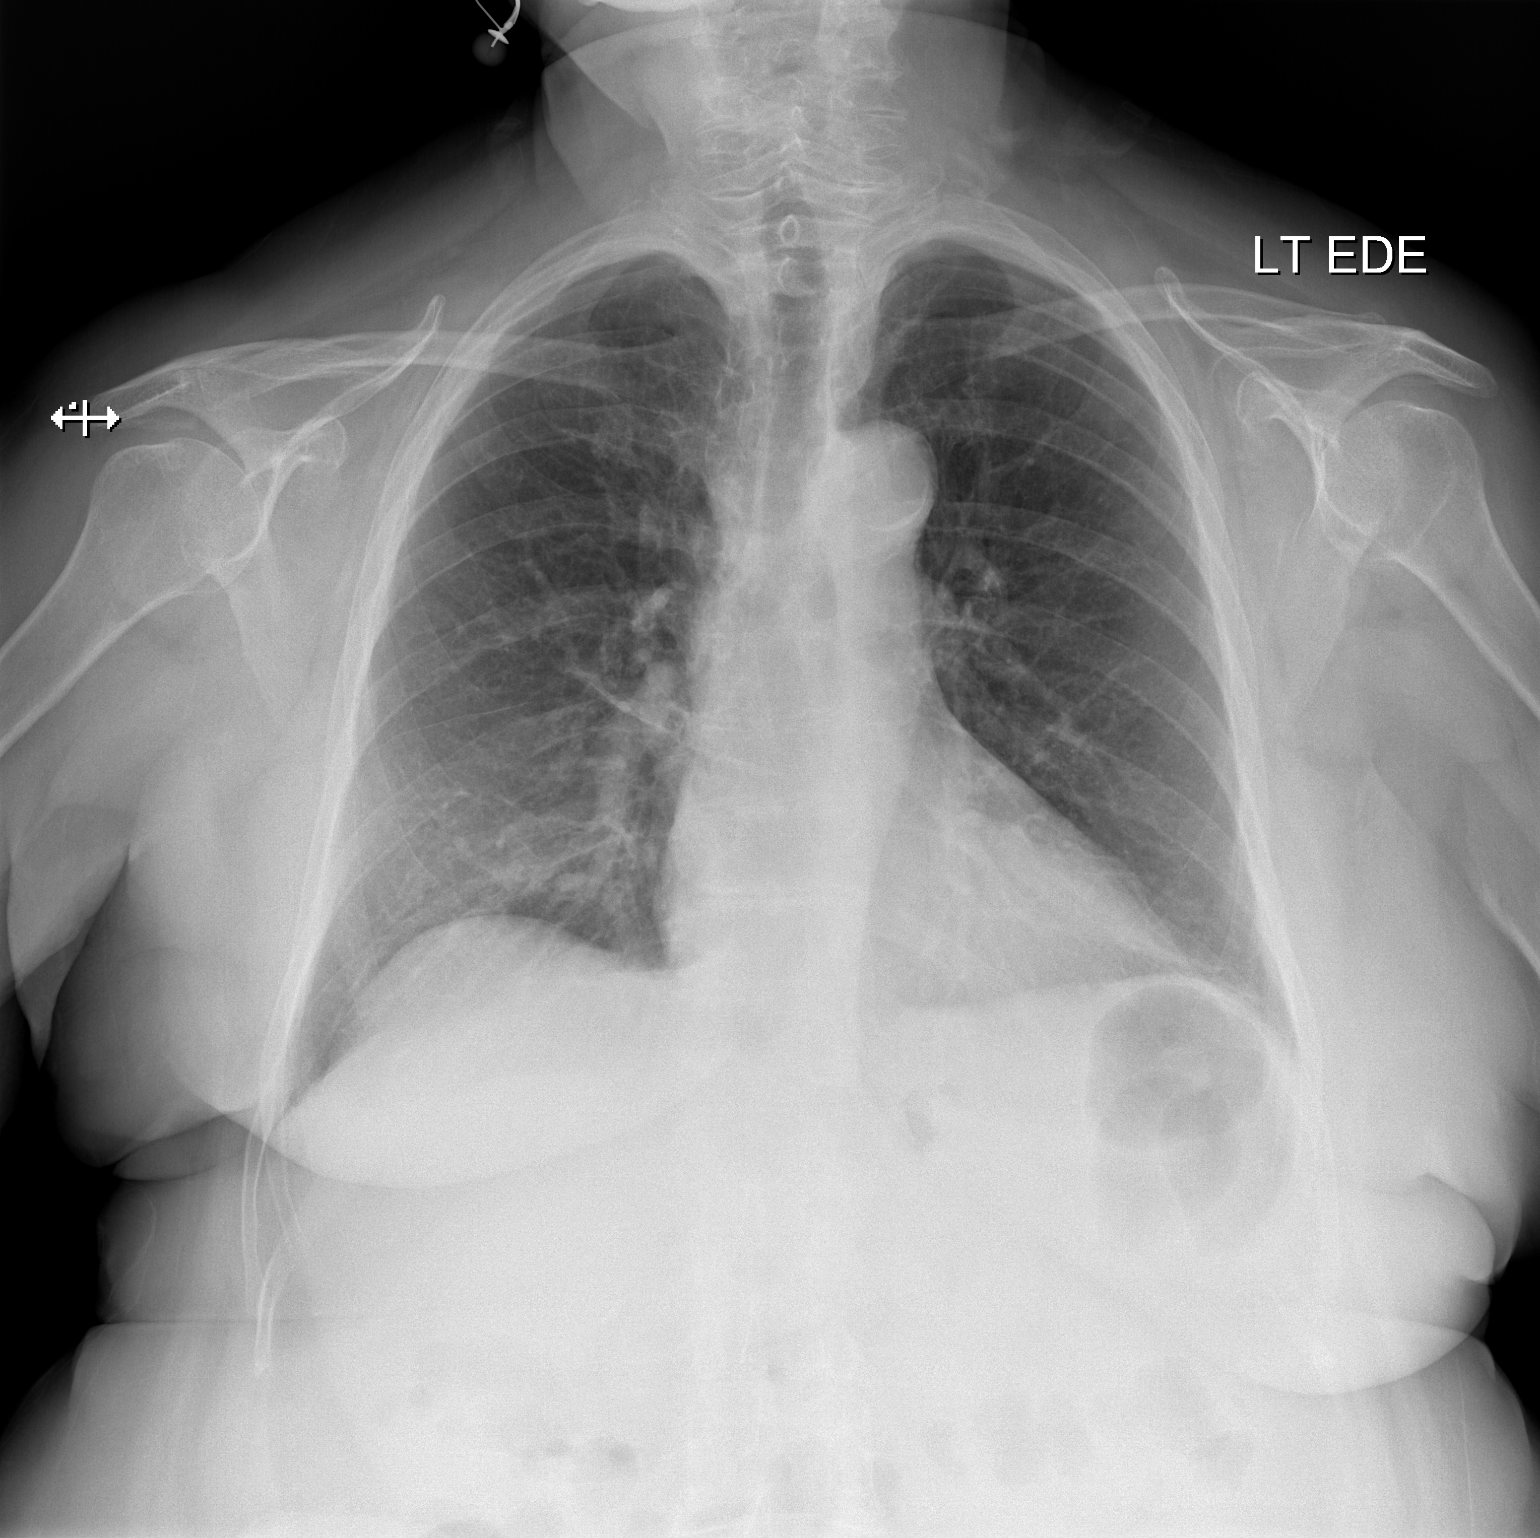

[w chest lat]
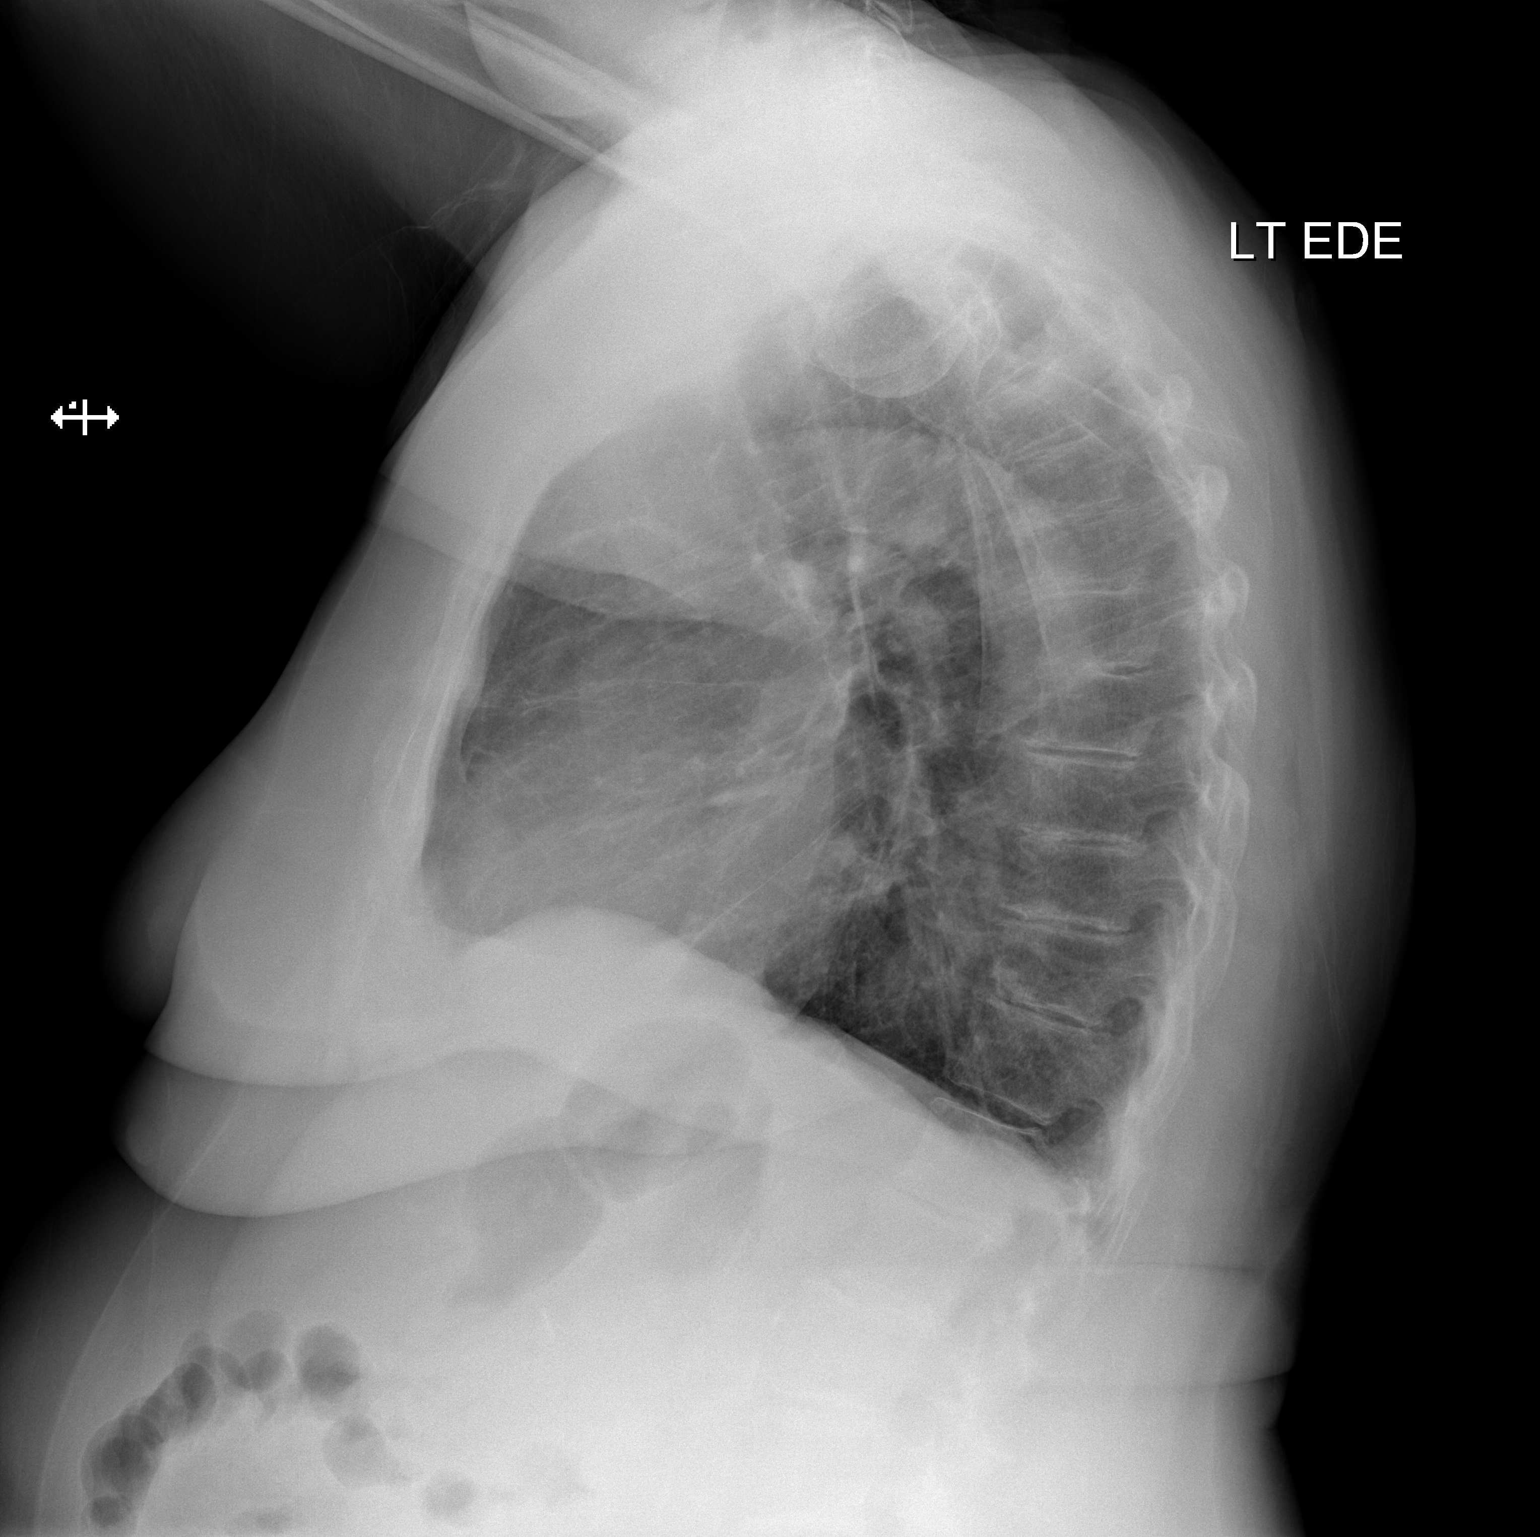

[2 of 2 positions shown; findings below may reference images not displayed]

FINDINGS: Stable normal cardiac silhouette. Aortic atherosclerosis with
calcification. Clear lungs. No pleural effusion or pneumothorax.
Mild degenerative changes of the thoracic spine. Right upper
quadrant cholecystectomy clips.
IMPRESSION: No acute pulmonary process identified.

By: Dursade Mrt M.D.

## 2018-02-28 IMAGING — DX DG CHEST 1V PORT
1 series · 1 of 1 positions shown · non-contrast
Comparison: 07/27/2016; chest CT - 06/30/2016

CLINICAL DATA: Post TAVR

EXAM:
PORTABLE CHEST 1 VIEW

[chest]
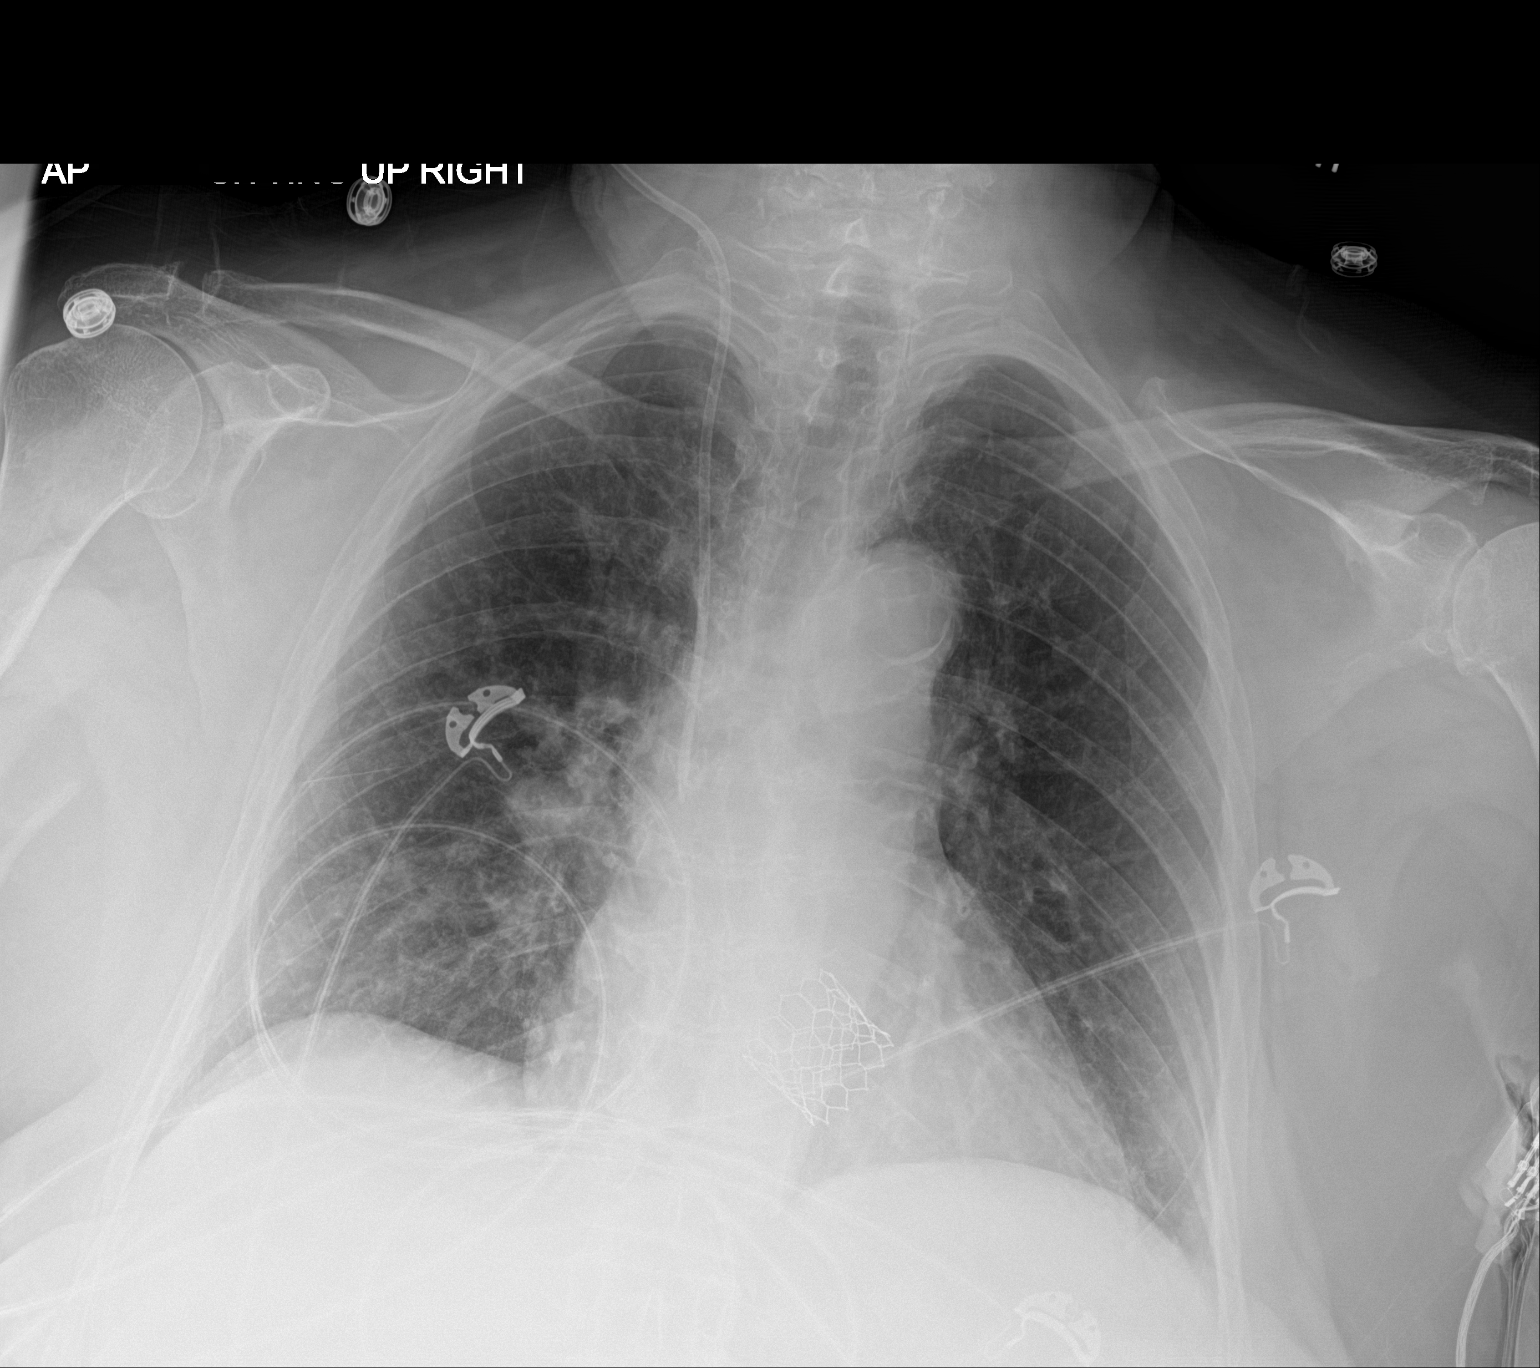

[1 of 1 positions shown; findings below may reference images not displayed]

FINDINGS: Grossly unchanged cardiac silhouette and mediastinal contours with
atherosclerotic plaque within thoracic aorta. Stable position of
support apparatus. Stable sequela of trans catheter aortic valve
replacement. Mild pulmonary venous congestion without frank evidence
of edema. Minimal bilateral infrahilar opacities favored to
represent atelectasis. No pleural effusion or pneumothorax. No
evidence of edema. No acute osseus abnormalities.
IMPRESSION: 1. Similar findings of mild pulmonary venous congestion without
frank evidence of edema.
2. Minimal bilateral infrahilar atelectasis.
3.  Aortic Atherosclerosis ([id number].V).

## 2018-03-10 DIAGNOSIS — N950 Postmenopausal bleeding: Secondary | ICD-10-CM | POA: Diagnosis not present

## 2018-03-15 ENCOUNTER — Telehealth: Payer: Self-pay | Admitting: Cardiovascular Disease

## 2018-03-15 DIAGNOSIS — D251 Intramural leiomyoma of uterus: Secondary | ICD-10-CM | POA: Diagnosis not present

## 2018-03-15 DIAGNOSIS — N840 Polyp of corpus uteri: Secondary | ICD-10-CM | POA: Diagnosis not present

## 2018-03-15 DIAGNOSIS — N950 Postmenopausal bleeding: Secondary | ICD-10-CM | POA: Diagnosis not present

## 2018-03-15 DIAGNOSIS — N858 Other specified noninflammatory disorders of uterus: Secondary | ICD-10-CM | POA: Diagnosis not present

## 2018-03-15 NOTE — Telephone Encounter (Signed)
Dr. Angelena Form pt for [facility] for polyp - ? Hold ASA with her hx of TAVR in 2018 hold 5 days?  She seemed stable on her last visit in Feb.  And to clarify SBE for dental only?  Thanks. Her procedure is for 03/27/18

## 2018-03-15 NOTE — Telephone Encounter (Signed)
   Primary Cardiologist: Lauree Chandler, MD  Chart reviewed as part of pre-operative protocol coverage. Patient was contacted 03/15/2018 in reference to pre-operative risk assessment for pending surgery as outlined below.  [name] was last seen on 02/13/18 by Dr. Angelena Form.  Since that day, [name] has done well with her hx of TAVR - no chest pain and no SOB.    OK to hold asprin for 5 days for procedure.  Therefore, based on ACC/AHA guidelines, the patient would be at acceptable risk for the planned procedure without further cardiovascular testing.   I will route this recommendation to the requesting party via Epic fax function and remove from pre-op pool.  Please call with questions.  Cecilie Kicks, NP 03/15/2018, 2:08 PM

## 2018-03-15 NOTE — Telephone Encounter (Signed)
     [facility] Pre-operative Risk Assessment    Request for surgical clearance:  1. What type of surgery is being performed? DNC for polyp  2. When is this surgery scheduled? 03/27/18  3. What type of clearance is required (medical clearance vs. Pharmacy clearance to hold med vs. Both)? both  4. Are there any medications that need to be held prior to surgery and how long? Would like advice about anticoagulation drugs  5. Practice name and name of physician performing surgery? Dr Charlesetta Ivory, [facility]  6. What is your office phone number [phone]   7.   What is your office fax number [phone]  8.   Anesthesia type (None, local, MAC, general) ? Local and IV sedation   [name] 03/15/2018, 12:04 PM  _________________________________________________________________   (provider comments below)

## 2018-03-15 NOTE — Telephone Encounter (Signed)
OK to hold ASA for her procedure. Thanks, Lauree Chandler

## 2018-03-24 NOTE — Pre-Procedure Instructions (Signed)
JOYLEEN HASELTON  03/24/2018      [facility] [place], [address] AT [facility] OF [address] [place] [address] Phone: [phone] Fax: [phone]    Your procedure is scheduled on Tuesday, March 31st.  Report to [facility] Entrance "A" Admitting at 5:30 A.M.  Call this number if you have problems the morning of surgery:  [phone]   Remember:  Do not eat or drink after midnight.   Take these medicines the morning of surgery with A SIP OF WATER  lansoprazole (PREVACID)  levothyroxine (SYNTHROID, LEVOTHROID)  Follow your surgeon's instructions on when to stop Asprin.  If no instructions were given by your surgeon then you will need to call the office to get those instructions.    7 days prior to surgery STOP taking any Aleve, Naproxen, Ibuprofen, Motrin, Advil, Goody's, BC's, all herbal medications, fish oil, and all vitamins.   Do not wear jewelry, make-up or nail polish.  Do not wear lotions, powders, or perfumes, or deodorant.  Do not shave 48 hours prior to surgery.  Do not bring valuables to the hospital.  [facility] is not responsible for any belongings or valuables.  Contacts, dentures or bridgework may not be worn into surgery.  Leave your suitcase in the car.  After surgery it may be brought to your room.  For patients admitted to the hospital, discharge time will be determined by your treatment team.  Patients discharged the day of surgery will not be allowed to drive home.   Special instructions:   [place]- Preparing For Surgery  Before surgery, you can play an important role. Because skin is not sterile, your skin needs to be as free of germs as possible. You can reduce the number of germs on your skin by washing with CHG (chlorahexidine gluconate) Soap before surgery.  CHG is an antiseptic cleaner which kills germs and bonds with the skin to continue killing germs even after  washing.    Oral Hygiene is also important to reduce your risk of infection.  Remember - BRUSH YOUR TEETH THE MORNING OF SURGERY WITH YOUR REGULAR TOOTHPASTE  Please do not use if you have an allergy to CHG or antibacterial soaps. If your skin becomes reddened/irritated stop using the CHG.  Do not shave (including legs and underarms) for at least 48 hours prior to first CHG shower. It is OK to shave your face.  Please follow these instructions carefully.   1. Shower the NIGHT BEFORE SURGERY and the MORNING OF SURGERY with CHG.   2. If you chose to wash your hair, wash your hair first as usual with your normal shampoo.  3. After you shampoo, rinse your hair and body thoroughly to remove the shampoo.  4. Use CHG as you would any other liquid soap. You can apply CHG directly to the skin and wash gently with a scrungie or a clean washcloth.   5. Apply the CHG Soap to your body ONLY FROM THE NECK DOWN.  Do not use on open wounds or open sores. Avoid contact with your eyes, ears, mouth and genitals (private parts). Wash Face and genitals (private parts)  with your normal soap.  6. Wash thoroughly, paying special attention to the area where your surgery will be performed.  7. Thoroughly rinse your body with warm water from the neck down.  8. DO NOT shower/wash with your normal soap after using and  rinsing off the CHG Soap.  9. Pat yourself dry with a CLEAN TOWEL.  10. Wear CLEAN PAJAMAS to bed the night before surgery, wear comfortable clothes the morning of surgery  11. Place CLEAN SHEETS on your bed the night of your first shower and DO NOT SLEEP WITH PETS.    Day of Surgery:  Do not apply any deodorants/lotions.  Please wear clean clothes to the hospital/surgery center.   Remember to brush your teeth WITH YOUR REGULAR TOOTHPASTE.   Please read over the following fact sheets that you were given.

## 2018-03-27 ENCOUNTER — Inpatient Hospital Stay (HOSPITAL_COMMUNITY)
Admission: RE | Admit: 2018-03-27 | Discharge: 2018-03-27 | Disposition: A | Discharge status: Home / Self Care | Payer: Medicare Other | Source: Ambulatory Visit

## 2018-03-27 ENCOUNTER — Encounter (HOSPITAL_COMMUNITY): Payer: Self-pay | Admitting: Physician Assistant

## 2018-04-04 ENCOUNTER — Ambulatory Visit (HOSPITAL_COMMUNITY)
Admission: RE | Admit: 2018-04-04 | Payer: Medicare Other | Source: Home / Self Care | Admitting: Obstetrics and Gynecology

## 2018-04-10 ENCOUNTER — Other Ambulatory Visit: Payer: Self-pay | Admitting: Cardiology

## 2018-05-26 ENCOUNTER — Encounter (HOSPITAL_COMMUNITY): Admission: RE | Payer: Self-pay | Source: Home / Self Care

## 2018-05-26 SURGERY — DILATATION AND CURETTAGE /HYSTEROSCOPY
Anesthesia: Choice

## 2018-06-09 DIAGNOSIS — M1712 Unilateral primary osteoarthritis, left knee: Secondary | ICD-10-CM | POA: Diagnosis not present

## 2018-06-09 DIAGNOSIS — M25562 Pain in left knee: Secondary | ICD-10-CM | POA: Diagnosis not present

## 2018-06-09 DIAGNOSIS — Z96651 Presence of right artificial knee joint: Secondary | ICD-10-CM | POA: Diagnosis not present

## 2018-06-19 DIAGNOSIS — N183 Chronic kidney disease, stage 3 (moderate): Secondary | ICD-10-CM | POA: Diagnosis not present

## 2018-06-19 DIAGNOSIS — G720 Drug-induced myopathy: Secondary | ICD-10-CM | POA: Diagnosis not present

## 2018-06-19 DIAGNOSIS — M810 Age-related osteoporosis without current pathological fracture: Secondary | ICD-10-CM | POA: Diagnosis not present

## 2018-06-19 DIAGNOSIS — Z0000 Encounter for general adult medical examination without abnormal findings: Secondary | ICD-10-CM | POA: Diagnosis not present

## 2018-06-19 DIAGNOSIS — E7800 Pure hypercholesterolemia, unspecified: Secondary | ICD-10-CM | POA: Diagnosis not present

## 2018-06-19 DIAGNOSIS — E039 Hypothyroidism, unspecified: Secondary | ICD-10-CM | POA: Diagnosis not present

## 2018-06-19 DIAGNOSIS — E559 Vitamin D deficiency, unspecified: Secondary | ICD-10-CM | POA: Diagnosis not present

## 2018-06-19 DIAGNOSIS — Z853 Personal history of malignant neoplasm of breast: Secondary | ICD-10-CM | POA: Diagnosis not present

## 2018-06-19 DIAGNOSIS — Z952 Presence of prosthetic heart valve: Secondary | ICD-10-CM | POA: Diagnosis not present

## 2018-08-17 ENCOUNTER — Ambulatory Visit: Admit: 2018-08-17 | Payer: Medicare Other | Admitting: Obstetrics and Gynecology

## 2018-08-17 SURGERY — DILATATION AND CURETTAGE /HYSTEROSCOPY
Anesthesia: Choice

## 2018-09-12 ENCOUNTER — Telehealth: Payer: Self-pay | Admitting: Cardiovascular Disease

## 2018-09-12 NOTE — Telephone Encounter (Signed)
Pt called to report that she has been having incrased SOB with exertion and has been having bilateral foot edema up to her ankles.. she is on prn Lasix but not noted on her med list. She reports that at some point someone had taken her off of it but she still has it.. she denies any changes in her diet that coud be adding to her symptoms... she denies chest pain, palpitations, dizziness... pt says she has had a dry cough for several days but denies she has been around anyone sick and she has ben social distancing and wearing her mask if she has to go anywhere. She also denies fever, sore throat and any other acute viral symptoms. Appt made for the pt to see Richardson Dopp PA 09/13/18. She will bring all of her meds with her for his review.       COVID-19 Pre-Screening Questions:  . In the past 7 to 10 days have you had a cough,  shortness of breath, headache, congestion, fever (100 or greater) body aches, chills, sore throat, or sudden loss of taste or sense of smell? Cough but addressed with the pt . Have you been around anyone with known Covid 19. NO . Have you been around anyone who is awaiting Covid 19 test results in the past 7 to 10 days? NO  Have you been around anyone who has been exposed to Covid 19, or has mentioned symptoms of Covid 19 within the past 7 to 10 days? NO  If you have any concerns/questions about symptoms patients report during screening (either on the phone or at threshold). Contact the provider seeing the patient or DOD for further guidance.  If neither are available contact a member of the leadership team.

## 2018-09-12 NOTE — Telephone Encounter (Signed)
° °  Patient would like to know if she needs to take Lasix everyday.    1) How much weight have you gained and in what time span?   2) If swelling, where is the swelling located? Legs, feet, ankles for over 1 week  3) Are you currently taking a fluid pill? yes  4) Are you currently SOB? No  5) Do you have a log of your daily weights (if so, list)? 181 today, 183  6) Have you gained 3 pounds in a day or 5 pounds in a week?   7) Have you traveled recently? no

## 2018-09-13 ENCOUNTER — Other Ambulatory Visit: Payer: Self-pay

## 2018-09-13 ENCOUNTER — Encounter: Payer: Self-pay | Admitting: Physician Assistant

## 2018-09-13 ENCOUNTER — Ambulatory Visit (INDEPENDENT_AMBULATORY_CARE_PROVIDER_SITE_OTHER): Payer: Medicare Other | Admitting: Physician Assistant

## 2018-09-13 VITALS — BP 126/68 | HR 63 | Ht 63.0 in | Wt 183.0 lb

## 2018-09-13 DIAGNOSIS — I350 Nonrheumatic aortic (valve) stenosis: Secondary | ICD-10-CM | POA: Diagnosis not present

## 2018-09-13 DIAGNOSIS — I739 Peripheral vascular disease, unspecified: Secondary | ICD-10-CM | POA: Diagnosis not present

## 2018-09-13 DIAGNOSIS — Z952 Presence of prosthetic heart valve: Secondary | ICD-10-CM

## 2018-09-13 DIAGNOSIS — I2510 Atherosclerotic heart disease of native coronary artery without angina pectoris: Secondary | ICD-10-CM | POA: Diagnosis not present

## 2018-09-13 DIAGNOSIS — I779 Disorder of arteries and arterioles, unspecified: Secondary | ICD-10-CM

## 2018-09-13 DIAGNOSIS — I5032 Chronic diastolic (congestive) heart failure: Secondary | ICD-10-CM

## 2018-09-13 NOTE — Progress Notes (Signed)
Cardiology Office Note:    Date:  09/13/2018   ID:  [name], DOB [birth year], MRN [id number]  PCP:  Gaynelle Arabian, MD  Cardiologist:  Lauree Chandler, MD   Electrophysiologist:  None   Referring MD: Gaynelle Arabian, MD   Chief Complaint  Patient presents with   Shortness of Breath   Leg Swelling     History of Present Illness:    [name] is a 85 y.o. female with:   Coronary artery disease  Mild plaque in LAD by cath in 2017  Aortic stenosis  S/p TAVR 07/27/2016  Echocardiogram 7/19: EF 55-60, s/p TAVR mean gradient 13, no AI  Chronic diastolic CHF  Hyperlipidemia  Intolerant to statins  SVT  Breast cancer  Status post lumpectomy  Chronic kidney disease 3  Hypothyroidism  [name] was last seen by Dr. Angelena Form in February 2020.  She recently noticed increased leg swelling and worsening shortness of breath.  She took a dose of Lasix and presents today for further evaluation.  This past weekend, she noticed a 2 pound weight gain as well as lower extremity swelling.  She noted shortness of breath with minimal activity.  She took Lasix 2 days ago.  Her weight came down 2 pounds and her leg swelling resolved.  She has felt fine since.  She does have a dry, nonproductive cough which has been persistent.  She notes a nonproductive cough prior to her valve surgery.  She has not had fevers.  She has not had orthopnea, paroxysmal nocturnal dyspnea.  She has not had chest pain, syncope.  Prior CV studies:   The following studies were reviewed today:  Echocardiogram 07/28/2017 Mild LVH, EF 55-60, normal wall motion, s/p TAVR with normal mean gradient of 13 mmHg and no AI, moderate LAE, MAC  Echocardiogram 08/25/2016  Mild concentric LVH, EF 60-65, normal wall motion, mild AI, mild LAE, mild TR, trivial PI, s/p TAVR with peak/mean gradients 29 and 16 mmHg respectively with mild paravalvular leak  Carotid US 06/30/2016 - Right - 40% to 59% ICA  stenosis. Vertebral artery flow is   antegrade. - Left - 40% to 59% ICA stenosis. Plaque morphology does not   support the increase. May possibly be due to tortuosity.   Vertebral artery flow is antegrade.  Exercise tolerance test 05/20/2016 No ischemia. Moderately impaired exercise capacity, the patient walked for 6 minutes, however with modified Bruce protocol this represents only 3.4 METS.   Event monitor 08/06/2015  Normal sinus rhythm with average heart rate 66bpm.  Occasional PVCs and PACs  Cardiac catheterization 07/18/2015  Mid LAD lesion, 20% stenosed. 1. Mild non-obstructive CAD 2. Moderate aortic stenosis (peak to peak gradient 20 mmHg, AVA 1.1 cm2)  Past Medical History:  Diagnosis Date   Aortic stenosis, severe    a.07/30/16 s/p TAVR    Breast cancer (HCC)    a. s/p lumpectomy, chemo/rad    Breast cancer, right breast ([place])    CAD (coronary artery disease), native coronary artery 10/20/2015   a. mild non obst CAD   CKD (chronic kidney disease), stage III (HCC)    Diastolic dysfunction    Hypercholesteremia    Hypothyroidism    Osteopenia    PUD (peptic ulcer disease)    Rheumatic fever    SVT (supraventricular tachycardia) ([place]) October 2014   converted with vagal maneuver   Vitamin D deficiency    Surgical Hx: The patient  has a past surgical history that includes Thyroid surgery;  Total hip arthroplasty (Left, 2003); Replacement total knee (Right, 2009); Breast surgery; Total hip arthroplasty (Right, 08/28/2013); Cholecystectomy (N/A, 08/06/2014); Cardiac catheterization (N/A, 07/18/2015); Cataract extraction w/ intraocular lens  implant, bilateral; Transcatheter aortic valve replacement, transfemoral (N/A, 07/27/2016); and TEE without cardioversion (N/A, 07/27/2016).   Current Medications: Current Meds  Medication Sig   aspirin EC 81 MG tablet Take 81 mg by mouth daily.   Calcium Carbonate-Vitamin D (CALCIUM 600/VITAMIN D PO) Take 1 tablet by mouth  daily.   Coenzyme Q10 (COQ10) 200 MG CAPS Take 200 mg by mouth daily.   ergocalciferol (VITAMIN D2) 1.25 MG (50000 UT) capsule Take 50,000 Units by mouth once a week.    fish oil-omega-3 fatty acids 1000 MG capsule Take 1 g by mouth daily.   furosemide (LASIX) 20 MG tablet Take 20 mg by mouth as needed for fluid or edema.    lansoprazole (PREVACID) 30 MG capsule Take 30 mg by mouth daily at 12 noon.    levothyroxine (SYNTHROID, LEVOTHROID) 112 MCG tablet Take 112 mcg by mouth daily before breakfast.   Magnesium 400 MG CAPS Take 400 mg by mouth daily.    Menthol, Topical Analgesic, (BIOFREEZE EX) Apply 1 application topically as needed (pain).    Multiple Vitamin (MULTIVITAMIN WITH MINERALS) TABS tablet Take 1 tablet by mouth daily.   potassium chloride (K-DUR) 10 MEQ tablet Take 10 mEq by mouth as needed (with lasix).   vitamin B-12 (CYANOCOBALAMIN) 1000 MCG tablet Take 1,000 mcg by mouth daily.   vitamin C (ASCORBIC ACID) 500 MG tablet Take 500 mg by mouth daily.   [DISCONTINUED] KLOR-CON M10 10 MEQ tablet TAKE 1 TABLET(10 MEQ) BY MOUTH DAILY (Patient taking differently: Take 10 mEq by mouth as needed (with lasix). )     Allergies:   Atorvastatin, Compazine [prochlorperazine], Floxin [ofloxacin], Livalo [pitavastatin], Pravastatin sodium, Simvastatin, and Fenofibrate   Social History   Tobacco Use   Smoking status: Never Smoker   Smokeless tobacco: Never Used  Substance Use Topics   Alcohol use: No   Drug use: No     Family Hx: The patient's family history includes Heart disease in her father and mother.  ROS:   Please see the history of present illness.    Review of Systems  HENT: Positive for congestion.   Respiratory: Positive for cough. Negative for sputum production.   Gastrointestinal: Negative for hematochezia and melena.  Genitourinary: Negative for hematuria.   All other systems reviewed and are negative.   EKGs/Labs/Other Test Reviewed:    EKG:   EKG is  ordered today.  The ekg ordered today demonstrates normal sinus rhythm, heart rate 63, normal axis, no ST-T wave changes, QTC 440  Recent Labs: No results found for requested labs within last 8760 hours.   Recent Lipid Panel No results found for: CHOL, TRIG, HDL, CHOLHDL, LDLCALC, LDLDIRECT    Physical Exam:    VS:  BP 126/68 (BP Location: Left Arm, Patient Position: Sitting, Cuff Size: Large)    Pulse 63    Ht _0  (1.6 m)    Wt 183 lb (83 kg)    LMP  (LMP Unknown)    SpO2 95% Comment: at rest   BMI 32.42 kg/m     Wt Readings from Last 3 Encounters:  09/13/18 183 lb (83 kg)  02/13/18 182 lb 1.9 oz (82.6 kg)  07/28/17 189 lb 6.4 oz (85.9 kg)     Physical Exam  Constitutional: She is oriented to person, place, and time.  She appears well-developed and well-nourished. No distress.  HENT:  Head: Normocephalic and atraumatic.  Eyes: No scleral icterus.  Neck: No JVD present. No thyromegaly present.  Cardiovascular: Normal rate and regular rhythm.  Murmur heard.  Crescendo-decrescendo systolic murmur is present with a grade of 1/6 at the upper right sternal border. Pulmonary/Chest: Effort normal and breath sounds normal. She has no rales.  Abdominal: Soft. There is no hepatomegaly.  Musculoskeletal:        General: Edema (trace ankle edema bilat) present.  Lymphadenopathy:    She has no cervical adenopathy.  Neurological: She is alert and oriented to person, place, and time.  Skin: Skin is warm and dry.  Psychiatric: She has a normal mood and affect.    ASSESSMENT & PLAN:    1. Chronic diastolic heart failure ([place]) She developed volume excess earlier this week.  She had prompt improvement with 1 dose of Lasix.  Currently, she looks stable without evidence of volume excess on exam.  I have recommended that she continue to weigh her self daily.  She should take Lasix as needed for weight gain of 2 to 3 pounds or more in 1 day.  She takes a dose of potassium with her  Lasix.  If she notices that she is taking more Lasix throughout the week, she should contact us for earlier follow-up.  Obtain follow-up echocardiogram to rule out reduced ejection fraction or worsening aortic stenosis.  Follow-up in 3 months.  2. Severe aortic stenosis 3. S/P TAVR (transcatheter aortic valve replacement) She had a stable gradient across her aortic valve prosthesis in July 2019.  Her exam does not really suggest worsening aortic stenosis.  However, given her recent episode of volume excess, I have recommended that we obtain a repeat echocardiogram.  Continue SBE prophylaxis.  4. Coronary artery disease involving native coronary artery of native heart without angina pectoris Minimal CAD by cardiac catheterization 2017.  She is not having any symptoms consistent with angina.  ECG is unchanged.  Continue aspirin.  She is intolerant to statins.  5. Bilateral carotid artery disease, unspecified type ([place]) Carotid US in 2018 demonstrated bilateral 40-59% stenosis.  Arrange follow-up carotid US.   Dispo:  Return in about 3 months (around 12/13/2018) for Routine Follow Up w/ Dr. Angelena Form.   Medication Adjustments/Labs and Tests Ordered: Current medicines are reviewed at length with the patient today.  Concerns regarding medicines are outlined above.  Tests Ordered: Orders Placed This Encounter  Procedures   EKG 12-Lead   ECHOCARDIOGRAM COMPLETE   VAS US CAROTID   Medication Changes: No orders of the defined types were placed in this encounter.   Signed, Richardson Dopp, PA-C  09/13/2018 5:59 PM    [facility] [place], [place], [place]  [address] Phone: [phone]; Fax: [phone]

## 2018-09-13 NOTE — Patient Instructions (Addendum)
Medication Instructions:  Your physician recommends that you continue on your current medications as directed. Please refer to the Current Medication list given to you today.   Labwork: -None  Testing/Procedures: Your physician has requested that you have an echocardiogram. Echocardiography is a painless test that uses sound waves to create images of your heart. It provides your doctor with information about the size and shape of your heart and how well your heart's chambers and valves are working. This procedure takes approximately one hour. There are no restrictions for this procedure.  Your physician has requested that you have a carotid duplex. This test is an ultrasound of the carotid arteries in your neck. It looks at blood flow through these arteries that supply the brain with blood. Allow one hour for this exam. There are no restrictions or special instructions.    Follow-Up: Your physician recommends that you keep your scheduled  follow-up appointment with Dr. Angelena Form on January 4 @ 8:20 am.  Any Other Special Instructions Will Be Listed Below (If Applicable).     If you need a refill on your cardiac medications before your next appointment, please call your pharmacy.

## 2018-09-18 DIAGNOSIS — Z23 Encounter for immunization: Secondary | ICD-10-CM | POA: Diagnosis not present

## 2018-09-22 ENCOUNTER — Telehealth: Payer: Self-pay

## 2018-09-22 ENCOUNTER — Encounter: Payer: Self-pay | Admitting: Physician Assistant

## 2018-09-22 ENCOUNTER — Ambulatory Visit (HOSPITAL_COMMUNITY): Payer: Medicare Other | Attending: Cardiovascular Disease

## 2018-09-22 ENCOUNTER — Other Ambulatory Visit: Payer: Self-pay

## 2018-09-22 DIAGNOSIS — I5032 Chronic diastolic (congestive) heart failure: Secondary | ICD-10-CM

## 2018-09-22 DIAGNOSIS — I350 Nonrheumatic aortic (valve) stenosis: Secondary | ICD-10-CM | POA: Diagnosis not present

## 2018-09-22 DIAGNOSIS — Z952 Presence of prosthetic heart valve: Secondary | ICD-10-CM | POA: Diagnosis not present

## 2018-09-22 DIAGNOSIS — I739 Peripheral vascular disease, unspecified: Secondary | ICD-10-CM | POA: Insufficient documentation

## 2018-09-22 DIAGNOSIS — I2510 Atherosclerotic heart disease of native coronary artery without angina pectoris: Secondary | ICD-10-CM | POA: Insufficient documentation

## 2018-09-22 DIAGNOSIS — I779 Disorder of arteries and arterioles, unspecified: Secondary | ICD-10-CM

## 2018-09-22 NOTE — Telephone Encounter (Signed)
Notes recorded by Frederik Schmidt, RN on 09/22/2018 at 12:54 PM EDT  The patient has been notified of the result and verbalized understanding. All questions (if any) were answered.  Frederik Schmidt, RN 09/22/2018 12:54 PM

## 2018-09-22 NOTE — Telephone Encounter (Signed)
-----   Message from Liliane Shi, [place] sent at 09/22/2018 12:18 PM EDT ----- Please call the patient. The echocardiogram shows normal heart function.  The aortic valve prosthesis is also functioning normally.  The gradients are just slightly increased from last year. PLAN:   - Continue current medications and follow up as planned.   - Send copy to PCP Richardson Dopp, PA-C    09/22/2018 12:15 PM

## 2018-09-26 ENCOUNTER — Encounter: Payer: Self-pay | Admitting: Physician Assistant

## 2018-09-26 ENCOUNTER — Ambulatory Visit (HOSPITAL_COMMUNITY)
Admission: RE | Admit: 2018-09-26 | Discharge: 2018-09-26 | Disposition: A | Discharge status: Home / Self Care | Payer: Medicare Other | Source: Ambulatory Visit | Attending: Cardiovascular Disease | Admitting: Cardiovascular Disease

## 2018-09-26 ENCOUNTER — Other Ambulatory Visit: Payer: Self-pay

## 2018-09-26 DIAGNOSIS — I350 Nonrheumatic aortic (valve) stenosis: Secondary | ICD-10-CM

## 2018-09-26 DIAGNOSIS — Z952 Presence of prosthetic heart valve: Secondary | ICD-10-CM

## 2018-09-26 DIAGNOSIS — I5032 Chronic diastolic (congestive) heart failure: Secondary | ICD-10-CM | POA: Insufficient documentation

## 2018-09-26 DIAGNOSIS — I779 Disorder of arteries and arterioles, unspecified: Secondary | ICD-10-CM

## 2018-09-26 DIAGNOSIS — I2510 Atherosclerotic heart disease of native coronary artery without angina pectoris: Secondary | ICD-10-CM | POA: Diagnosis not present

## 2018-09-26 DIAGNOSIS — I739 Peripheral vascular disease, unspecified: Secondary | ICD-10-CM | POA: Diagnosis not present

## 2018-10-05 DIAGNOSIS — Z961 Presence of intraocular lens: Secondary | ICD-10-CM | POA: Diagnosis not present

## 2018-10-18 ENCOUNTER — Other Ambulatory Visit: Payer: Self-pay | Admitting: Family Medicine

## 2018-10-18 DIAGNOSIS — Z1231 Encounter for screening mammogram for malignant neoplasm of breast: Secondary | ICD-10-CM

## 2018-11-09 ENCOUNTER — Other Ambulatory Visit: Payer: Self-pay

## 2018-11-09 DIAGNOSIS — Z20822 Contact with and (suspected) exposure to covid-19: Secondary | ICD-10-CM

## 2018-11-09 DIAGNOSIS — Z20828 Contact with and (suspected) exposure to other viral communicable diseases: Secondary | ICD-10-CM | POA: Diagnosis not present

## 2018-11-11 LAB — NOVEL CORONAVIRUS, NAA: SARS-CoV-2, NAA: NOT DETECTED

## 2018-11-27 ENCOUNTER — Other Ambulatory Visit (HOSPITAL_COMMUNITY): Payer: Self-pay | Admitting: Physician Assistant

## 2018-11-27 DIAGNOSIS — I6523 Occlusion and stenosis of bilateral carotid arteries: Secondary | ICD-10-CM

## 2018-12-05 ENCOUNTER — Ambulatory Visit: Payer: Medicare Other

## 2018-12-08 ENCOUNTER — Ambulatory Visit
Admission: RE | Admit: 2018-12-08 | Discharge: 2018-12-08 | Disposition: A | Discharge status: Home / Self Care | Payer: Medicare Other | Source: Ambulatory Visit | Attending: Family Medicine | Admitting: Family Medicine

## 2018-12-08 ENCOUNTER — Other Ambulatory Visit: Payer: Self-pay

## 2018-12-08 DIAGNOSIS — Z1231 Encounter for screening mammogram for malignant neoplasm of breast: Secondary | ICD-10-CM | POA: Diagnosis not present

## 2019-01-07 NOTE — Progress Notes (Signed)
Chief Complaint  Patient presents with  . Follow-up    CAD   History of Present Illness: 86 yo female with history of CAD, HLD, SVT, breast cancer, chronic diastolic CHF, bilateral carotid artery diseaes and severe aortic valve stenosis s/p TAVR who is here today for cardiac follow up.  Cardiac cath in July 2017 with mild plaque in the LAD. She underwent TAVR on 07/27/16 with placement of a 26 mm Edwards Sapien 3 bioprosthetic AVR from the right femoral artery. Post operative echo showed a normally functioning AVR. She had been followed in the past for her CAD and AS by Dr. Radford Pax. She was seen in our office September 2020 by Richardson Dopp, PA-C with volume overload that responded quickly to Lasix therapy. Echo September 2020 with LVEF=55-60%, mild LVH, normally functioning bioprosthetic AVR with mean gradient 17 mmHg (13 mmHg post immediately post implant), trace MR.   She is here today for follow up. The patient denies any chest pain, dyspnea, palpitations, lower extremity edema, orthopnea, PND, dizziness, near syncope or syncope. She is feeling great. Only using Lasix occasionally.     Primary Care Physician: Gaynelle Arabian, MD  Past Medical History:  Diagnosis Date  . Aortic stenosis, severe    a.07/30/16 s/p TAVR // Echo 09/2018: EF 55-60, mild LVH, mild LAE, trace MR, mild TR, TAVR with no paravalvular leak and mean gradient 17 (increased from 13 one year ago)  . Breast cancer ([place])    a. s/p lumpectomy, chemo/rad   . Breast cancer, right breast ([place])   . CAD (coronary artery disease), native coronary artery 10/20/2015   a. mild non obst CAD  . Carotid artery disease ([place])    Carotid US 09/2018: Bilateral ICA 40-59; repeat 1 year  . CKD (chronic kidney disease), stage III   . Diastolic dysfunction   . Hypercholesteremia   . Hypothyroidism   . Osteopenia   . PUD (peptic ulcer disease)   . Rheumatic fever   . SVT (supraventricular tachycardia) [facility]) October 2014   converted with  vagal maneuver  . Vitamin D deficiency     Past Surgical History:  Procedure Laterality Date  . BREAST LUMPECTOMY Right   . BREAST SURGERY     RIGHT LUMPECTOMY AND REMOVAL OF LYMPH NODES  . CARDIAC CATHETERIZATION N/A 07/18/2015   Procedure: Right/Left Heart Cath and Coronary Angiography;  Surgeon: [name], MD;  Location: [facility];  Service: Cardiovascular;  Laterality: N/A;  . CATARACT EXTRACTION W/ INTRAOCULAR LENS  IMPLANT, BILATERAL     2017  . CHOLECYSTECTOMY N/A 08/06/2014   Procedure: LAPAROSCOPIC CHOLECYSTECTOMY ;  Surgeon: Rolm Bookbinder, MD;  Location: [place];  Service: General;  Laterality: N/A;  . REPLACEMENT TOTAL KNEE Right 2009  . TEE WITHOUT CARDIOVERSION N/A 07/27/2016   Procedure: TRANSESOPHAGEAL ECHOCARDIOGRAM (TEE);  Surgeon: [name], MD;  Location: [place];  Service: Open Heart Surgery;  Laterality: N/A;  . THYROID SURGERY     NODULE REMOVED  . TOTAL HIP ARTHROPLASTY Left 2003  . TOTAL HIP ARTHROPLASTY Right 08/28/2013   Procedure: RIGHT TOTAL HIP ARTHROPLASTY ANTERIOR APPROACH;  Surgeon: Mauri Pole, MD;  [facility];  Service: Orthopedics;  Laterality: Right;  . TRANSCATHETER AORTIC VALVE REPLACEMENT, TRANSFEMORAL N/A 07/27/2016   Procedure: TRANSCATHETER AORTIC VALVE REPLACEMENT, TRANSFEMORAL;  Surgeon: [name], MD;  Location: [place];  Service: Open Heart Surgery;  Laterality: N/A;    Current Outpatient Medications  Medication Sig Dispense Refill  .  aspirin EC 81 MG tablet Take 81 mg by mouth daily.    . Calcium Carbonate-Vitamin D (CALCIUM 600/VITAMIN D PO) Take 1 tablet by mouth daily.    . Coenzyme Q10 (COQ10) 200 MG CAPS Take 200 mg by mouth daily.    . ergocalciferol (VITAMIN D2) 1.25 MG (50000 UT) capsule Take 50,000 Units by mouth once a week.     . fish oil-omega-3 fatty acids 1000 MG capsule Take 1 g by mouth daily.    . furosemide (LASIX) 20 MG tablet Take 20 mg by mouth as needed for fluid  or edema.     . lansoprazole (PREVACID) 30 MG capsule Take 30 mg by mouth daily at 12 noon.     Marland Kitchen levothyroxine (SYNTHROID, LEVOTHROID) 112 MCG tablet Take 112 mcg by mouth daily before breakfast.    . Magnesium 400 MG CAPS Take 400 mg by mouth daily.     . Menthol, Topical Analgesic, (BIOFREEZE EX) Apply 1 application topically as needed (pain).     . Multiple Vitamin (MULTIVITAMIN WITH MINERALS) TABS tablet Take 1 tablet by mouth daily.    . potassium chloride (K-DUR) 10 MEQ tablet Take 10 mEq by mouth as needed (with lasix).    . vitamin B-12 (CYANOCOBALAMIN) 1000 MCG tablet Take 1,000 mcg by mouth daily.    . vitamin C (ASCORBIC ACID) 500 MG tablet Take 500 mg by mouth daily.     No current facility-administered medications for this visit.    Allergies  Allergen Reactions  . Atorvastatin Other (See Comments)    Myalgias 10/2011  . Compazine [Prochlorperazine] Other (See Comments)    unknown  . Floxin [Ofloxacin] Other (See Comments)    unknown  . Livalo [Pitavastatin] Other (See Comments)    Leg pain  . Pravastatin Sodium Other (See Comments)    mylgias 09/2011  . Simvastatin Other (See Comments)    Elevated lft's 06/2011  . Fenofibrate Rash    Rash 09/2011    Social History   Socioeconomic History  . Marital status: Widowed    Spouse name: Not on file  . Number of children: Not on file  . Years of education: Not on file  . Highest education level: Not on file  Occupational History  . Not on file  Tobacco Use  . Smoking status: Never Smoker  . Smokeless tobacco: Never Used  Substance and Sexual Activity  . Alcohol use: No  . Drug use: No  . Sexual activity: Not on file  Other Topics Concern  . Not on file  Social History Narrative  . Not on file   Social Determinants of Health   Financial Resource Strain:   . Difficulty of Paying Living Expenses: Not on file  Food Insecurity:   . Worried About Charity fundraiser in the Last Year: Not on file  . Ran Out of  Food in the Last Year: Not on file  Transportation Needs:   . Lack of Transportation (Medical): Not on file  . Lack of Transportation (Non-Medical): Not on file  Physical Activity:   . Days of Exercise per Week: Not on file  . Minutes of Exercise per Session: Not on file  Stress:   . Feeling of Stress : Not on file  Social Connections:   . Frequency of Communication with Friends and Family: Not on file  . Frequency of Social Gatherings with Friends and Family: Not on file  . Attends Religious Services: Not on file  . Active  Member of Clubs or Organizations: Not on file  . Attends Archivist Meetings: Not on file  . Marital Status: Not on file  Intimate Partner Violence:   . Fear of Current or Ex-Partner: Not on file  . Emotionally Abused: Not on file  . Physically Abused: Not on file  . Sexually Abused: Not on file    Family History  Problem Relation Age of Onset  . Heart disease Mother   . Heart disease Father     Review of Systems:  As stated in the HPI and otherwise negative.   BP (!) 144/68   Pulse 78   Ht 5\' 3"  (1.6 m)   Wt 182 lb 12.8 oz (82.9 kg)   LMP  (LMP Unknown)   SpO2 96%   BMI 32.38 kg/m   Physical Examination:  General: Well developed, well nourished, NAD  HEENT: OP clear, mucus membranes moist  SKIN: warm, dry. No rashes. Neuro: No focal deficits  Musculoskeletal: Muscle strength 5/5 all ext  Psychiatric: Mood and affect normal  Neck: No JVD, no carotid bruits, no thyromegaly, no lymphadenopathy.  Lungs:Clear bilaterally, no wheezes, rhonci, crackles Cardiovascular: Regular rate and rhythm. No murmurs, gallops or rubs. Abdomen:Soft. Bowel sounds present. Non-tender.  Extremities: No lower extremity edema. Pulses are 2 + in the bilateral DP/PT.  Echo September 2020:  1. Left ventricular ejection fraction, by visual estimation, is 55 to 60%. The left ventricle has normal function. Left ventricular septal wall thickness was mildly  increased. There is mildly increased left ventricular hypertrophy.  2. Global right ventricle has normal systolic function.The right ventricular size is normal. No increase in right ventricular wall thickness.  3. Left atrial size was mildly dilated.  4. Right atrial size was normal.  5. The mitral valve is normal in structure. Trace mitral valve regurgitation.  6. The tricuspid valve is normal in structure. Tricuspid valve regurgitation is mild.  7. Aortic valve regurgitation was not visualized by color flow Doppler.  8. 26 mm Sapien 3 TAVR valve no PVL previous mean gradient July 2019 was 13 mmHg mildly higher this exam at 17 mmHg.  9. The pulmonic valve was grossly normal. Pulmonic valve regurgitation is trivial by color flow Doppler.   EKG:  EKG is not ordered today. The ekg ordered today demonstrates   Recent Labs: No results found for requested labs within last 8760 hours.   Lipid Panel No results found for: CHOL, TRIG, HDL, CHOLHDL, VLDL, LDLCALC, LDLDIRECT   Wt Readings from Last 3 Encounters:  01/08/19 182 lb 12.8 oz (82.9 kg)  09/13/18 183 lb (83 kg)  02/13/18 182 lb 1.9 oz (82.6 kg)     Other studies Reviewed: Additional studies/ records that were reviewed today include: . Review of the above records demonstrates:   Assessment and Plan:   1. Severe aortic valve stenosis: She underwent TAVR with placement of a 26 mm Sapien 3 valve from the right transfemoral approach in July 2018. Her valve is working well by echo in September 2020. Will continue ASA and SBE propylaxis.    2. CAD without angina: She is known to have mild CAD by cath in 2017. She has no chest pain. Continue ASA She is statin intolerant.  3.SVT: She has no palpitations.   4. Chronic diastolic CHF: Weight is stable. No evidence of volume overload on exam. Lasix as needed.   5. Carotid artery disease: Mild to moderate bilateral carotid artery disease by dopplers September 2020.   Current  medicines are  reviewed at length with the patient today.  The patient does not have concerns regarding medicines.  The following changes have been made:  no change  Labs/ tests ordered today include:   No orders of the defined types were placed in this encounter.  Disposition:   FU with me in 12 months  Signed, Lauree Chandler, MD 01/08/2019 8:49 AM    [facility] [place], [place], [place]  [address] Phone: [phone]; Fax: [phone]

## 2019-01-08 ENCOUNTER — Other Ambulatory Visit: Payer: Self-pay

## 2019-01-08 ENCOUNTER — Encounter: Payer: Self-pay | Admitting: Cardiovascular Disease

## 2019-01-08 ENCOUNTER — Ambulatory Visit (INDEPENDENT_AMBULATORY_CARE_PROVIDER_SITE_OTHER): Payer: Medicare Other | Admitting: Cardiovascular Disease

## 2019-01-08 VITALS — BP 144/68 | HR 78 | Ht 63.0 in | Wt 182.8 lb

## 2019-01-08 DIAGNOSIS — I350 Nonrheumatic aortic (valve) stenosis: Secondary | ICD-10-CM | POA: Diagnosis not present

## 2019-01-08 DIAGNOSIS — I2510 Atherosclerotic heart disease of native coronary artery without angina pectoris: Secondary | ICD-10-CM | POA: Diagnosis not present

## 2019-01-08 DIAGNOSIS — I779 Disorder of arteries and arterioles, unspecified: Secondary | ICD-10-CM

## 2019-01-08 DIAGNOSIS — I471 Supraventricular tachycardia: Secondary | ICD-10-CM

## 2019-01-08 DIAGNOSIS — I5032 Chronic diastolic (congestive) heart failure: Secondary | ICD-10-CM

## 2019-01-08 NOTE — Patient Instructions (Signed)
Medication Instructions:  No changes *If you need a refill on your cardiac medications before your next appointment, please call your pharmacy*  Lab Work: none If you have labs (blood work) drawn today and your tests are completely normal, you will receive your results only by: . MyChart Message (if you have MyChart) OR . A paper copy in the mail If you have any lab test that is abnormal or we need to change your treatment, we will call you to review the results.  Testing/Procedures: none  Follow-Up: At [facility], you and your health needs are our priority.  As part of our continuing mission to provide you with exceptional heart care, we have created designated Provider Care Teams.  These Care Teams include your primary Cardiologist (physician) and Advanced Practice Providers (APPs -  Physician Assistants and Nurse Practitioners) who all work together to provide you with the care you need, when you need it.  Your next appointment:   12 month(s)  The format for your next appointment:   Either In Person or Virtual  Provider:   Christopher McAlhany, MD  Other Instructions   

## 2019-01-21 ENCOUNTER — Ambulatory Visit: Payer: Medicare Other | Attending: Internal Medicine

## 2019-01-21 DIAGNOSIS — Z23 Encounter for immunization: Secondary | ICD-10-CM | POA: Insufficient documentation

## 2019-01-21 NOTE — Progress Notes (Signed)
   Covid-19 Vaccination Clinic  Name:  [name]    MRN: [id number] DOB: [birth year]  01/21/2019  [name] was observed post Covid-19 immunization for 15 minutes without incidence. She was provided with Vaccine Information Sheet and instruction to access the V-Safe system.   [name] was instructed to call 911 with any severe reactions post vaccine: Marland Kitchen Difficulty breathing  . Swelling of your face and throat  . A fast heartbeat  . A bad rash all over your body  . Dizziness and weakness

## 2019-01-27 ENCOUNTER — Ambulatory Visit: Payer: Medicare Other

## 2019-02-09 ENCOUNTER — Ambulatory Visit: Payer: Medicare Other | Attending: Internal Medicine

## 2019-02-09 DIAGNOSIS — Z23 Encounter for immunization: Secondary | ICD-10-CM

## 2019-02-09 NOTE — Progress Notes (Signed)
   Covid-19 Vaccination Clinic  Name:  [name]    MRN: [id number] DOB: August 17, 1931  02/09/2019  [name] was observed post Covid-19 immunization for 15 minutes without incidence. She was provided with Vaccine Information Sheet and instruction to access the V-Safe system.   [name] was instructed to call 911 with any severe reactions post vaccine: Marland Kitchen Difficulty breathing  . Swelling of your face and throat  . A fast heartbeat  . A bad rash all over your body  . Dizziness and weakness    Immunizations Administered    Name Date Dose VIS Date Route   Pfizer COVID-19 Vaccine 02/09/2019  9:58 AM 0.3 mL 12/15/2018 Intramuscular   Manufacturer: [place]   Lot: [id number]   [place]: [id number]

## 2019-02-12 DIAGNOSIS — Z471 Aftercare following joint replacement surgery: Secondary | ICD-10-CM | POA: Diagnosis not present

## 2019-02-12 DIAGNOSIS — M1712 Unilateral primary osteoarthritis, left knee: Secondary | ICD-10-CM | POA: Diagnosis not present

## 2019-02-12 DIAGNOSIS — M25562 Pain in left knee: Secondary | ICD-10-CM | POA: Diagnosis not present

## 2019-02-12 DIAGNOSIS — Z96651 Presence of right artificial knee joint: Secondary | ICD-10-CM | POA: Diagnosis not present

## 2019-03-08 ENCOUNTER — Telehealth: Payer: Self-pay | Admitting: Cardiovascular Disease

## 2019-03-08 NOTE — Telephone Encounter (Signed)
Pt c/o BP issue: STAT if pt c/o blurred vision, one-sided weakness or slurred speech  1. What are your last 5 BP readings?  10PM last night: 150/72  Today: 7:10am - 161/89 8:00am - 140/76 8:15am - 150/72 8:45am - 139/75 9:25am - 132/66.Marland Kitchenstates this is normal 11:00am - 159/89 1:45pm - 142/71  2. Are you having any other symptoms (ex. Dizziness, headache, blurred vision, passed out)? Dizziness last night when she got up out of her chair.   3. What is your BP issue? BP is more elevated that normal. Patient states that last night she got up from her chair about 7:30pm and felt dizzy, did not check her BP until 10pm.

## 2019-03-08 NOTE — Telephone Encounter (Signed)
Has never needed to take medicines for BP. Had checked it for a while after last visit when it was up in the office. She stopped because it seemed to be staying in the 130s.  Last night got up quickly to close her blinds and she felt dizzy so started checking frequently last night and today.  She thinks she could have been a little dehydrated yesterday, didn't drink as much.  Took a prn lasix on Tue.  Does not feel like she is retaining extra fluid at this time.  I adv her to monitor BP over the next two weeks - (3-4 times a week after sitting quiet and still for 5-10 min) and call with readings at that time.  Call sooner if more dizziness or high readings.  She is aware I am forwarding to Dr. Angelena Form for review and if there are any other recommendations we will call her back.

## 2019-03-09 NOTE — Telephone Encounter (Signed)
Agree. Thanks

## 2019-05-14 DIAGNOSIS — R05 Cough: Secondary | ICD-10-CM | POA: Diagnosis not present

## 2019-05-14 DIAGNOSIS — R062 Wheezing: Secondary | ICD-10-CM | POA: Diagnosis not present

## 2019-10-05 DIAGNOSIS — D3132 Benign neoplasm of left choroid: Secondary | ICD-10-CM | POA: Diagnosis not present

## 2019-10-05 DIAGNOSIS — Z961 Presence of intraocular lens: Secondary | ICD-10-CM | POA: Diagnosis not present

## 2019-10-11 DIAGNOSIS — Z23 Encounter for immunization: Secondary | ICD-10-CM | POA: Diagnosis not present

## 2019-10-26 DIAGNOSIS — M25562 Pain in left knee: Secondary | ICD-10-CM | POA: Diagnosis not present

## 2019-10-26 DIAGNOSIS — Z96651 Presence of right artificial knee joint: Secondary | ICD-10-CM | POA: Diagnosis not present

## 2019-10-26 DIAGNOSIS — M1712 Unilateral primary osteoarthritis, left knee: Secondary | ICD-10-CM | POA: Diagnosis not present

## 2019-11-04 DIAGNOSIS — M25562 Pain in left knee: Secondary | ICD-10-CM | POA: Insufficient documentation

## 2019-11-04 DIAGNOSIS — Z96651 Presence of right artificial knee joint: Secondary | ICD-10-CM | POA: Insufficient documentation

## 2019-11-04 DIAGNOSIS — M1712 Unilateral primary osteoarthritis, left knee: Secondary | ICD-10-CM | POA: Insufficient documentation

## 2019-11-06 DIAGNOSIS — Z23 Encounter for immunization: Secondary | ICD-10-CM | POA: Diagnosis not present

## 2019-11-12 ENCOUNTER — Other Ambulatory Visit: Payer: Self-pay | Admitting: Family Medicine

## 2019-11-12 DIAGNOSIS — Z1231 Encounter for screening mammogram for malignant neoplasm of breast: Secondary | ICD-10-CM

## 2019-11-14 ENCOUNTER — Other Ambulatory Visit: Payer: Self-pay

## 2019-11-14 ENCOUNTER — Ambulatory Visit (HOSPITAL_COMMUNITY)
Admission: RE | Admit: 2019-11-14 | Discharge: 2019-11-14 | Disposition: A | Discharge status: Home / Self Care | Payer: Medicare Other | Source: Ambulatory Visit | Attending: Internal Medicine | Admitting: Internal Medicine

## 2019-11-14 DIAGNOSIS — I6523 Occlusion and stenosis of bilateral carotid arteries: Secondary | ICD-10-CM | POA: Insufficient documentation

## 2019-11-16 ENCOUNTER — Encounter: Payer: Self-pay | Admitting: Physician Assistant

## 2019-12-21 ENCOUNTER — Other Ambulatory Visit: Payer: Self-pay

## 2019-12-21 ENCOUNTER — Ambulatory Visit
Admission: RE | Admit: 2019-12-21 | Discharge: 2019-12-21 | Disposition: A | Discharge status: Home / Self Care | Payer: Medicare Other | Source: Ambulatory Visit | Attending: Family Medicine | Admitting: Family Medicine

## 2019-12-21 DIAGNOSIS — Z1231 Encounter for screening mammogram for malignant neoplasm of breast: Secondary | ICD-10-CM

## 2020-02-07 NOTE — Progress Notes (Signed)
Chief Complaint  Patient presents with  . Follow-up    CAD   History of Present Illness: 87 yo female with history of CAD, HLD, SVT, breast cancer, chronic diastolic CHF, bilateral carotid artery disease and severe aortic valve stenosis s/p TAVR who is here today for cardiac follow up.  Cardiac cath in July 2017 with mild plaque in the LAD. She underwent TAVR on 07/27/16 with placement of a 26 mm Edwards Sapien 3 bioprosthetic AVR from the right femoral artery. Post operative echo showed a normally functioning AVR. She had been followed in the past for her CAD and AS by Dr. Radford Pax. She was seen in our office September 2020 by Richardson Dopp, PA-C with volume overload that responded quickly to Lasix therapy. Echo September 2020 with LVEF=55-60%, mild LVH, normally functioning bioprosthetic AVR with mean gradient 17 mmHg (13 mmHg post immediately post implant), trace MR. Carotid artery dopplers November 2021 with moderate bilateral carotid artery disease.   She is here today for follow up. The patient denies any chest pain, dyspnea, palpitations, lower extremity edema, orthopnea, PND, dizziness, near syncope or syncope. She is feeling great.     Primary Care Physician: Gaynelle Arabian, MD  Past Medical History:  Diagnosis Date  . Aortic stenosis, severe    a.07/30/16 s/p TAVR // Echo 09/2018: EF 55-60, mild LVH, mild LAE, trace MR, mild TR, TAVR with no paravalvular leak and mean gradient 17 (increased from 13 one year ago)  . Breast cancer ([place])    a. s/p lumpectomy, chemo/rad   . Breast cancer, right breast ([place])   . CAD (coronary artery disease), native coronary artery 10/20/2015   a. mild non obst CAD  . Carotid artery disease ([place])    Carotid US 09/2018: Bilateral ICA 40-59 // Carotid US 11/21: Bilateral ICA 40-59; repeat 1 year   . CKD (chronic kidney disease), stage III ([place])   . Diastolic dysfunction   . Hypercholesteremia   . Hypothyroidism   . Osteopenia   . PUD (peptic ulcer  disease)   . Rheumatic fever   . SVT (supraventricular tachycardia) [facility]) October 2014   converted with vagal maneuver  . Vitamin D deficiency     Past Surgical History:  Procedure Laterality Date  . BREAST LUMPECTOMY Right   . BREAST SURGERY     RIGHT LUMPECTOMY AND REMOVAL OF LYMPH NODES  . CARDIAC CATHETERIZATION N/A 07/18/2015   Procedure: Right/Left Heart Cath and Coronary Angiography;  Surgeon: [name], MD;  Location: [facility];  Service: Cardiovascular;  Laterality: N/A;  . CATARACT EXTRACTION W/ INTRAOCULAR LENS  IMPLANT, BILATERAL     2017  . CHOLECYSTECTOMY N/A 08/06/2014   Procedure: LAPAROSCOPIC CHOLECYSTECTOMY ;  Surgeon: Rolm Bookbinder, MD;  Location: [place];  Service: General;  Laterality: N/A;  . REPLACEMENT TOTAL KNEE Right 2009  . TEE WITHOUT CARDIOVERSION N/A 07/27/2016   Procedure: TRANSESOPHAGEAL ECHOCARDIOGRAM (TEE);  Surgeon: [name], MD;  Location: [facility];  Service: Open Heart Surgery;  Laterality: N/A;  . THYROID SURGERY     NODULE REMOVED  . TOTAL HIP ARTHROPLASTY Left 2003  . TOTAL HIP ARTHROPLASTY Right 08/28/2013   Procedure: RIGHT TOTAL HIP ARTHROPLASTY ANTERIOR APPROACH;  Surgeon: Mauri Pole, MD;  [facility];  Service: Orthopedics;  Laterality: Right;  . TRANSCATHETER AORTIC VALVE REPLACEMENT, TRANSFEMORAL N/A 07/27/2016   Procedure: TRANSCATHETER AORTIC VALVE REPLACEMENT, TRANSFEMORAL;  Surgeon: [name], MD;  Location: [place] [place];  Service: Open Heart  Surgery;  Laterality: N/A;    Current Outpatient Medications  Medication Sig Dispense Refill  . aspirin EC 81 MG tablet Take 81 mg by mouth daily.    . Calcium Carbonate-Vitamin D (CALCIUM 600/VITAMIN D PO) Take 1 tablet by mouth daily.    . Coenzyme Q10 (COQ10) 200 MG CAPS Take 200 mg by mouth daily.    . ergocalciferol (VITAMIN D2) 1.25 MG (50000 UT) capsule Take 50,000 Units by mouth once a week.     . fish oil-omega-3 fatty acids 1000 MG  capsule Take 1 g by mouth daily.    . furosemide (LASIX) 20 MG tablet Take 20 mg by mouth as needed for fluid or edema.     . lansoprazole (PREVACID) 30 MG capsule Take 30 mg by mouth daily at 12 noon.     Marland Kitchen levothyroxine (SYNTHROID, LEVOTHROID) 112 MCG tablet Take 112 mcg by mouth daily before breakfast.    . LYSINE PO Take 1,000 mg by mouth daily.    . Magnesium 400 MG CAPS Take 400 mg by mouth daily.     . Menthol, Topical Analgesic, (BIOFREEZE EX) Apply 1 application topically as needed (pain).     . Multiple Vitamin (MULTIVITAMIN WITH MINERALS) TABS tablet Take 1 tablet by mouth daily.    . potassium chloride (K-DUR) 10 MEQ tablet Take 10 mEq by mouth as needed (with lasix).    . vitamin B-12 (CYANOCOBALAMIN) 1000 MCG tablet Take 1,000 mcg by mouth daily.    . vitamin C (ASCORBIC ACID) 500 MG tablet Take 500 mg by mouth daily.    . Zinc 50 MG TABS Take 1 tablet by mouth daily.     No current facility-administered medications for this visit.    Allergies  Allergen Reactions  . Atorvastatin Other (See Comments)    Myalgias 10/2011  . Compazine [Prochlorperazine] Other (See Comments)    unknown  . Floxin [Ofloxacin] Other (See Comments)    unknown  . Livalo [Pitavastatin] Other (See Comments)    Leg pain  . Pravastatin Sodium Other (See Comments)    mylgias 09/2011  . Simvastatin Other (See Comments)    Elevated lft's 06/2011  . Fenofibrate Rash    Rash 09/2011    Social History   Socioeconomic History  . Marital status: Widowed    Spouse name: Not on file  . Number of children: Not on file  . Years of education: Not on file  . Highest education level: Not on file  Occupational History  . Not on file  Tobacco Use  . Smoking status: Never Smoker  . Smokeless tobacco: Never Used  Vaping Use  . Vaping Use: Never used  Substance and Sexual Activity  . Alcohol use: No  . Drug use: No  . Sexual activity: Not on file  Other Topics Concern  . Not on file  Social History  Narrative  . Not on file   Social Determinants of Health   Financial Resource Strain: Not on file  Food Insecurity: Not on file  Transportation Needs: Not on file  Physical Activity: Not on file  Stress: Not on file  Social Connections: Not on file  Intimate Partner Violence: Not on file    Family History  Problem Relation Age of Onset  . Heart disease Mother   . Heart disease Father     Review of Systems:  As stated in the HPI and otherwise negative.   BP (!) 146/84   Pulse 64   Ht  5\' 3"  (1.6 m)   Wt 179 lb 12.8 oz (81.6 kg)   LMP  (LMP Unknown)   SpO2 95%   BMI 31.85 kg/m   Physical Examination:  General: Well developed, well nourished, NAD  HEENT: OP clear, mucus membranes moist  SKIN: warm, dry. No rashes. Neuro: No focal deficits  Musculoskeletal: Muscle strength 5/5 all ext  Psychiatric: Mood and affect normal  Neck: No JVD, no carotid bruits, no thyromegaly, no lymphadenopathy.  Lungs:Clear bilaterally, no wheezes, rhonci, crackles Cardiovascular: Regular rate and rhythm. No murmurs, gallops or rubs. Abdomen:Soft. Bowel sounds present. Non-tender.  Extremities: No lower extremity edema. Pulses are 2 + in the bilateral DP/PT.  Echo September 2020:  1. Left ventricular ejection fraction, by visual estimation, is 55 to 60%. The left ventricle has normal function. Left ventricular septal wall thickness was mildly increased. There is mildly increased left ventricular hypertrophy.  2. Global right ventricle has normal systolic function.The right ventricular size is normal. No increase in right ventricular wall thickness.  3. Left atrial size was mildly dilated.  4. Right atrial size was normal.  5. The mitral valve is normal in structure. Trace mitral valve regurgitation.  6. The tricuspid valve is normal in structure. Tricuspid valve regurgitation is mild.  7. Aortic valve regurgitation was not visualized by color flow Doppler.  8. 26 mm Sapien 3 TAVR valve no  PVL previous mean gradient July 2019 was 13 mmHg mildly higher this exam at 17 mmHg.  9. The pulmonic valve was grossly normal. Pulmonic valve regurgitation is trivial by color flow Doppler.   EKG:  EKG is ordered today. The ekg ordered today demonstrates Sinus  Recent Labs: No results found for requested labs within last 8760 hours.   Lipid Panel No results found for: CHOL, TRIG, HDL, CHOLHDL, VLDL, LDLCALC, LDLDIRECT   Wt Readings from Last 3 Encounters:  02/08/20 179 lb 12.8 oz (81.6 kg)  01/08/19 182 lb 12.8 oz (82.9 kg)  09/13/18 183 lb (83 kg)     Other studies Reviewed: Additional studies/ records that were reviewed today include: . Review of the above records demonstrates:   Assessment and Plan:   1. Severe aortic valve stenosis: She underwent TAVR with placement of a 26 mm Sapien 3 valve from the right transfemoral approach in July 2018. Her valve is working well by echo in September 2020. Continue ASA and SBE prophylaxis.    2. CAD without angina: She is known to have mild CAD by cath in 2017. No chest pain. Continue ASA. She is statin intolerant.  3.SVT: Rare palpitations.    4. Chronic diastolic CHF: No volume overload on exam. Weight is stable. She will use Lasix as needed.   5. Carotid artery disease: Mild to moderate bilateral carotid artery disease by dopplers November 2021. Repeat November 2022.   Current medicines are reviewed at length with the patient today.  The patient does not have concerns regarding medicines.  The following changes have been made:  no change  Labs/ tests ordered today include:   Orders Placed This Encounter  Procedures  . EKG 12-Lead  . VAS US CAROTID   Disposition:   FU with me in 12 months  Signed, Lauree Chandler, MD 02/11/2020 7:11 AM    [facility] [place], [place], [place]  [address] Phone: [phone]; Fax: [phone]

## 2020-02-08 ENCOUNTER — Other Ambulatory Visit: Payer: Self-pay

## 2020-02-08 ENCOUNTER — Ambulatory Visit (INDEPENDENT_AMBULATORY_CARE_PROVIDER_SITE_OTHER): Payer: Medicare Other | Admitting: Cardiovascular Disease

## 2020-02-08 ENCOUNTER — Encounter: Payer: Self-pay | Admitting: Cardiovascular Disease

## 2020-02-08 VITALS — BP 146/84 | HR 64 | Ht 63.0 in | Wt 179.8 lb

## 2020-02-08 DIAGNOSIS — I5032 Chronic diastolic (congestive) heart failure: Secondary | ICD-10-CM

## 2020-02-08 DIAGNOSIS — I359 Nonrheumatic aortic valve disorder, unspecified: Secondary | ICD-10-CM | POA: Diagnosis not present

## 2020-02-08 DIAGNOSIS — I779 Disorder of arteries and arterioles, unspecified: Secondary | ICD-10-CM

## 2020-02-08 DIAGNOSIS — I2510 Atherosclerotic heart disease of native coronary artery without angina pectoris: Secondary | ICD-10-CM | POA: Diagnosis not present

## 2020-02-08 DIAGNOSIS — Z952 Presence of prosthetic heart valve: Secondary | ICD-10-CM | POA: Diagnosis not present

## 2020-02-08 NOTE — Patient Instructions (Signed)
Medication Instructions:  No changes *If you need a refill on your cardiac medications before your next appointment, please call your pharmacy*   Lab Work: none If you have labs (blood work) drawn today and your tests are completely normal, you will receive your results only by: Marland Kitchen MyChart Message (if you have MyChart) OR . A paper copy in the mail If you have any lab test that is abnormal or we need to change your treatment, we will call you to review the results.   Testing/Procedures: DUE IN November 2022 Your physician has requested that you have a carotid duplex. This test is an ultrasound of the carotid arteries in your neck. It looks at blood flow through these arteries that supply the brain with blood. Allow one hour for this exam. There are no restrictions or special instructions.   Follow-Up: At [facility], you and your health needs are our priority.  As part of our continuing mission to provide you with exceptional heart care, we have created designated Provider Care Teams.  These Care Teams include your primary Cardiologist (physician) and Advanced Practice Providers (APPs -  Physician Assistants and Nurse Practitioners) who all work together to provide you with the care you need, when you need it.   Your next appointment:   12 month(s)  The format for your next appointment:   In Person  Provider:   You may see Lauree Chandler, MD or one of the following Advanced Practice Providers on your designated Care Team:    Melina Copa, PA-C  Ermalinda Barrios, PA-C   Other Instructions

## 2020-03-17 DIAGNOSIS — M1712 Unilateral primary osteoarthritis, left knee: Secondary | ICD-10-CM | POA: Diagnosis not present

## 2020-03-17 DIAGNOSIS — M7062 Trochanteric bursitis, left hip: Secondary | ICD-10-CM | POA: Diagnosis not present

## 2020-04-01 ENCOUNTER — Other Ambulatory Visit (HOSPITAL_COMMUNITY)
Admission: RE | Admit: 2020-04-01 | Discharge: 2020-04-01 | Disposition: A | Discharge status: Home / Self Care | Payer: Medicare Other | Source: Ambulatory Visit | Attending: Obstetrics and Gynecology | Admitting: Obstetrics and Gynecology

## 2020-04-01 ENCOUNTER — Ambulatory Visit (INDEPENDENT_AMBULATORY_CARE_PROVIDER_SITE_OTHER): Payer: Medicare Other | Admitting: Obstetrics and Gynecology

## 2020-04-01 ENCOUNTER — Other Ambulatory Visit: Payer: Self-pay

## 2020-04-01 ENCOUNTER — Encounter: Payer: Self-pay | Admitting: Obstetrics and Gynecology

## 2020-04-01 VITALS — BP 122/76 | HR 77 | Ht 63.0 in | Wt 176.0 lb

## 2020-04-01 DIAGNOSIS — L309 Dermatitis, unspecified: Secondary | ICD-10-CM

## 2020-04-01 DIAGNOSIS — N882 Stricture and stenosis of cervix uteri: Secondary | ICD-10-CM

## 2020-04-01 DIAGNOSIS — Z124 Encounter for screening for malignant neoplasm of cervix: Secondary | ICD-10-CM | POA: Diagnosis not present

## 2020-04-01 DIAGNOSIS — N950 Postmenopausal bleeding: Secondary | ICD-10-CM

## 2020-04-01 DIAGNOSIS — N840 Polyp of corpus uteri: Secondary | ICD-10-CM | POA: Diagnosis not present

## 2020-04-01 DIAGNOSIS — I2510 Atherosclerotic heart disease of native coronary artery without angina pectoris: Secondary | ICD-10-CM

## 2020-04-01 MED ORDER — BETAMETHASONE VALERATE 0.1 % EX OINT: 1.0000 "application " | TOPICAL_OINTMENT | Freq: Two times a day (BID) | CUTANEOUS | 0 refills | Status: DC

## 2020-04-01 NOTE — Patient Instructions (Signed)

## 2020-04-01 NOTE — Progress Notes (Signed)
87 y.o. G0P0000 Widowed White or Caucasian Not Hispanic or Latino female here for post menopausal bleeding. Patient states that she has been having light vaginal spotting since "Sunday. No pain. She isn't sexually active.  She had diarrhea on Thursday and Friday of last week and has been having perianal irritation since then.   H/O breast cancer.   No h/o abnormal paps  No bladder c/o.   No LMP recorded (lmp unknown). Patient is postmenopausal.          Sexually active: No.  The current method of family planning is post menopausal status.    Exercising: Yes.    walking  Smoker:  no  Health Maintenance: Pap:  09/27/12 normal  History of abnormal Pap:  no MMG:  12/26/19 Bi-rads 1  Neg HX of breast cancer 2005 BMD:   yes Colonoscopy: 11 years ago  TDaP:  Unsure  Gardasil: none   reports that she has never smoked. She has never used smokeless tobacco. She reports that she does not drink alcohol and does not use drugs.  Past Medical History:  Diagnosis Date  . Aortic stenosis, severe    a.07/30/16 s/p TAVR // Echo 09/2018: EF 55-60, mild LVH, mild LAE, trace MR, mild TR, TAVR with no paravalvular leak and mean gradient 17 (increased from 13 one year ago)  . Breast cancer (HCC)    a. s/p lumpectomy, chemo/rad   . Breast cancer, right breast (HCC)   . CAD (coronary artery disease), native coronary artery 10/20/2015   a. mild non obst CAD  . Carotid artery disease (HCC)    Carotid US 09/2018: Bilateral ICA 40-59 // Carotid US 11/21: Bilateral ICA 40-59; repeat 1 year   . CKD (chronic kidney disease), stage III (HCC)   . Diastolic dysfunction   . Hypercholesteremia   . Hypothyroidism   . Osteopenia   . PUD (peptic ulcer disease)   . Rheumatic fever   . SVT (supraventricular tachycardia) (HCC) October 2014   converted with vagal maneuver  . Vitamin D deficiency     Past Surgical History:  Procedure Laterality Date  . BREAST LUMPECTOMY Right   . BREAST SURGERY     RIGHT  LUMPECTOMY AND REMOVAL OF LYMPH NODES  . CARDIAC CATHETERIZATION N/A 07/18/2015   Procedure: Right/Left Heart Cath and Coronary Angiography;  Surgeon: Christopher D McAlhany, MD;  [facility];  Service: Cardiovascular;  Laterality: N/A;  . CATARACT EXTRACTION W/ INTRAOCULAR LENS  IMPLANT, BILATERAL     20" 17  . CHOLECYSTECTOMY N/A 08/06/2014   Procedure: LAPAROSCOPIC CHOLECYSTECTOMY ;  Surgeon: Rolm Bookbinder, MD;  Location: [place];  Service: General;  Laterality: N/A;  . REPLACEMENT TOTAL KNEE Right 2009  . TEE WITHOUT CARDIOVERSION N/A 07/27/2016   Procedure: TRANSESOPHAGEAL ECHOCARDIOGRAM (TEE);  Surgeon: [name], MD;  Location: [place];  Service: Open Heart Surgery;  Laterality: N/A;  . THYROID SURGERY     NODULE REMOVED  . TOTAL HIP ARTHROPLASTY Left 2003  . TOTAL HIP ARTHROPLASTY Right 08/28/2013   Procedure: RIGHT TOTAL HIP ARTHROPLASTY ANTERIOR APPROACH;  Surgeon: Mauri Pole, MD;  [facility]: [facility];  Service: Orthopedics;  Laterality: Right;  . TRANSCATHETER AORTIC VALVE REPLACEMENT, TRANSFEMORAL N/A 07/27/2016   Procedure: TRANSCATHETER AORTIC VALVE REPLACEMENT, TRANSFEMORAL;  Surgeon: [name], MD;  Location: [place];  Service: Open Heart Surgery;  Laterality: N/A;    Current Outpatient Medications  Medication Sig Dispense Refill  . aspirin EC 81 MG tablet Take 81  mg by mouth daily.    . Calcium Carbonate-Vitamin D (CALCIUM 600/VITAMIN D PO) Take 1 tablet by mouth daily.    . Coenzyme Q10 (COQ10) 200 MG CAPS Take 200 mg by mouth daily.    . ergocalciferol (VITAMIN D2) 1.25 MG (50000 UT) capsule Take 50,000 Units by mouth once a week.     . fish oil-omega-3 fatty acids 1000 MG capsule Take 1 g by mouth daily.    . furosemide (LASIX) 20 MG tablet Take 20 mg by mouth as needed for fluid or edema.     . lansoprazole (PREVACID) 30 MG capsule Take 30 mg by mouth daily at 12 noon.     Marland Kitchen levothyroxine (SYNTHROID, LEVOTHROID) 112 MCG tablet Take  112 mcg by mouth daily before breakfast.    . LYSINE PO Take 1,000 mg by mouth daily.    . Magnesium 400 MG CAPS Take 400 mg by mouth daily.     . Menthol, Topical Analgesic, (BIOFREEZE EX) Apply 1 application topically as needed (pain).     . Multiple Vitamin (MULTIVITAMIN WITH MINERALS) TABS tablet Take 1 tablet by mouth daily.    . potassium chloride (K-DUR) 10 MEQ tablet Take 10 mEq by mouth as needed (with lasix).    . vitamin B-12 (CYANOCOBALAMIN) 1000 MCG tablet Take 1,000 mcg by mouth daily.    . vitamin C (ASCORBIC ACID) 500 MG tablet Take 500 mg by mouth daily.    . Zinc 50 MG TABS Take 1 tablet by mouth daily.     No current facility-administered medications for this visit.    Family History  Problem Relation Age of Onset  . Heart disease Mother   . Heart disease Father     Review of Systems  Genitourinary: Negative for vaginal bleeding.    Exam:   BP 122/76   Pulse 77   Ht 5\' 3"  (1.6 m)   Wt 176 lb (79.8 kg)   LMP  (LMP Unknown)   SpO2 98%   BMI 31.18 kg/m   Weight change: @WEIGHTCHANGE @ Height:   Height: 5\' 3"  (160 cm)  Ht Readings from Last 3 Encounters:  04/01/20 5\' 3"  (1.6 m)  02/08/20 5\' 3"  (1.6 m)  01/08/19 5\' 3"  (1.6 m)    General appearance: alert, cooperative and appears stated age Head: Normocephalic, without obvious abnormality, atraumatic Neck: no adenopathy, supple, symmetrical, trachea midline and thyroid normal to inspection and palpation Lungs: clear to auscultation bilaterally Cardiovascular: regular rate and rhythm Abdomen: soft, non-tender; non distended,  no masses,  no organomegaly Extremities: extremities normal, atraumatic, no cyanosis or edema Skin: Skin color, texture, turgor normal. No rashes or lesions Lymph nodes: Cervical, supraclavicular, and axillary nodes normal. No abnormal inguinal nodes palpated Neurologic: Grossly normal   Pelvic: External genitalia:  no lesions              Urethra:  normal appearing urethra with no  masses, tenderness or lesions              Bartholins and Skenes: normal                 Vagina: atrophic appearing vagina with normal color and discharge, no lesions. A small amount of blood was present in the vagina.               Cervix: no lesions, stenotic               Bimanual Exam:  Uterus:  normal size, contour, position,  consistency, mobility, non-tender and anteverted              Adnexa: no mass, fullness, tenderness               Rectovaginal: Confirms               Anus:  normal sphincter tone, no lesions  Perianal skin: mild erythema  The risks of endometrial biopsy were reviewed and a consent was obtained.  A speculum was placed in the vagina and the cervix was cleansed with Hibiclens. A tenaculum was placed on the cervix and the cervix was dilated with the mini-dilators up to a #3 hagar dilator. The pipelle was placed into the endometrial cavity. The uterus sounded to ~7 cm. The endometrial biopsy was performed, taking care to get a representative sample, sampling 360 degrees of the uterine cavity. Minimal tissue with blood was obtained. The tenaculum and speculum were removed. There were no complications.    Gae Dry chaperoned for the exam.  1. Postmenopausal bleeding Blood noted vaginally, no other significant findings on exam.  - US PELVIS TRANSVAGINAL NON-OB (TV ONLY); Future - Surgical pathology( [place]/ [facility]) - Cytology - PAP  2. Cervical stenosis (uterine cervix) Needed to dilate her cervix  3. Screening for cervical cancer - Cytology - PAP  4. Perianal dermatitis Started after her diarrhea last week - betamethasone valerate ointment (VALISONE) 0.1 %; Apply 1 application topically 2 (two) times daily. Can use for up to 2 weeks as needed  Dispense: 15 g; Refill: 0

## 2020-04-02 ENCOUNTER — Other Ambulatory Visit: Payer: Self-pay

## 2020-04-02 DIAGNOSIS — N950 Postmenopausal bleeding: Secondary | ICD-10-CM

## 2020-04-03 LAB — CYTOLOGY - PAP: Diagnosis: NEGATIVE

## 2020-04-03 LAB — SURGICAL PATHOLOGY

## 2020-04-07 ENCOUNTER — Telehealth: Payer: Self-pay | Admitting: *Deleted

## 2020-04-07 NOTE — Telephone Encounter (Signed)
Burnice Logan, RN  04/07/2020 11:54 AM EDT Back to Top     Left message to call Sharee Pimple, RN at [place], [phone].

## 2020-04-07 NOTE — Telephone Encounter (Signed)
-----   Message from Salvadore Dom, MD sent at 04/07/2020 10:00 AM EDT ----- Please let the patient know that her pap is normal. Her endometrial biopsy returned with endometrial polyp. Typically the biopsy only gets a portion of a polyp. Her choices are to return for a sonohysterogram or just set her up for a hysteroscopy, D&C. She is currently scheduled for a gyn ultrasound on 4/21, that would need to be changed or canceled depending on how she would like to proceed. If polyp is seen on sonohysterogram, then she would still need the hysteroscopy. She will also need cardiac clearance prior to surgery.

## 2020-04-14 NOTE — Telephone Encounter (Signed)
Left message to call Sumayya Muha, RN at [facility], [phone].  

## 2020-04-15 ENCOUNTER — Telehealth: Payer: Self-pay | Admitting: Cardiovascular Disease

## 2020-04-15 NOTE — Telephone Encounter (Signed)
Spoke with patients daughter, per patients request. Reviewed results and recommendations as seen below. Request to proceed with Hysteroscopy D&C. Reviewed surgery dates, request to proceed on 05/26/20. Daughter declined earlier dates, she will be out of town.   PUS cancelled.   Advised I will return call to patient to review once surgery date confirmed. Daughter verbalizes understanding and is agreeable.   Call placed to [facility] at [phone], spoke with [facility], preop clearance requested.   Routing to [employer] for [employer].

## 2020-04-15 NOTE — Telephone Encounter (Signed)
   [facility] Pre-operative Risk Assessment    Request for surgical clearance:  1. What type of surgery is being performed? hysterectomy D and C  2. When is this surgery scheduled? May 23  3. What type of clearance is required (medical clearance vs. Pharmacy clearance to hold med vs. Both)? medical  4. Are there any medications that need to be held prior to surgery and how long? No on the dr end  5. Practice name and name of physician performing surgery? Dr. Sumner Boast   6. What is your office phone number [phone]   7.   What is your office fax number [phone]  8.   Anesthesia type (None, local, MAC, general) ? general   [name] 04/15/2020, 4:15 PM  _________________________________________________________________   (provider comments below)

## 2020-04-15 NOTE — Telephone Encounter (Signed)
Patient returned call. Patient request this RN contact her daughter, [name], directly at [phone] to further discuss recommendations.   Call placed to daughter, Left message to call Sharee Pimple, RN at [place], [phone].

## 2020-04-15 NOTE — Telephone Encounter (Signed)
Left message on home and mobile numbers to call Sharee Pimple, RN at [place], [phone].

## 2020-04-15 NOTE — Telephone Encounter (Signed)
Spoke with patient. Advised as seen below per Dr. Talbert Nan.  Patient states she would like to review with her daughter and return call.  Reviewed available surgery dates.  Patient verbalizes understanding and is agreeable.

## 2020-04-16 NOTE — Telephone Encounter (Signed)
Left VM.   Pt has a history of nonobstructive CAD by heart cath in 2017, TAVR in 2018.

## 2020-04-16 NOTE — Telephone Encounter (Signed)
Spoke with patient regarding surgery benefits. Patient acknowledges understanding of information presented. Patient is aware that benefits presented are professional benefits only. Patient is aware that once surgery is scheduled, the hospital will call with separate benefits. See account note.

## 2020-04-17 NOTE — Telephone Encounter (Signed)
Surgery request sent for 05/26/20.

## 2020-04-23 ENCOUNTER — Telehealth: Payer: Self-pay | Admitting: Cardiovascular Disease

## 2020-04-23 NOTE — Telephone Encounter (Signed)
Looks like pre op provider was wanting to s/w the pt .

## 2020-04-23 NOTE — Telephone Encounter (Signed)
Spoke with patient. Surgery date request confirmed.  Advised surgery is scheduled for 05/26/20 at [facility] 0730. Surgery instruction sheet and hospital brochure reviewed, printed copy will be mailed.  Patient advised of Covid screening and quarantine requirements and agreeable.  Cardiology clearance pending. Patient will return call to cardiology.  Pre-op visit 05/07/20 Patient verbalizes understanding and is agreeable.

## 2020-04-23 NOTE — Telephone Encounter (Signed)
I have called and spok with the patient.  She has been cleared for surgery.  Clearance letter has been sent back to the surgeon's office

## 2020-04-23 NOTE — Telephone Encounter (Signed)
Patient stated she was returning the nurse call in regards to her upcoming surgery. Please advise

## 2020-04-23 NOTE — Telephone Encounter (Signed)
Operator sent message to triage stating pt called back  To discuss her surgery. Call was sent to call back. I noted the phone note from today pt returning call to pre op provider.

## 2020-04-23 NOTE — Telephone Encounter (Signed)
   Name: GENAVIVE KUBICKI  DOB: 1931-09-16  MRN: [id number]   Primary Cardiologist: Lauree Chandler, MD  Chart reviewed as part of pre-operative protocol coverage. Patient was contacted 04/23/2020 in reference to pre-operative risk assessment for pending surgery as outlined below.  ELAIJAH MUNOZ was last seen on 02/08/2020 by Dr. Angelena Form.  Since that day, CORIANNE BUCCELLATO has done well without any chest pain or worsening dyspnea.  She had minimal CAD on cardiac cath in July 2017.  She is able to achieve 4 METS activity if needed.  Therefore, based on ACC/AHA guidelines, the patient would be at acceptable risk for the planned procedure without further cardiovascular testing.   The patient was advised that if she develops new symptoms prior to surgery to contact our office to arrange for a follow-up visit, and she verbalized understanding.  I will route this recommendation to the requesting party via Epic fax function and remove from pre-op pool. Please call with questions.  [place], [place] 04/23/2020, 11:56 AM

## 2020-04-24 ENCOUNTER — Other Ambulatory Visit: Payer: Medicare Other

## 2020-04-24 ENCOUNTER — Other Ambulatory Visit: Payer: Medicare Other | Admitting: Obstetrics and Gynecology

## 2020-04-24 NOTE — Telephone Encounter (Signed)
Pre-op clearance received from cardiology, copy of letter to Dr. Talbert Nan for review.   Routing to provider for final review. Patient is agreeable to disposition. Will close encounter.

## 2020-05-05 ENCOUNTER — Telehealth: Payer: Self-pay | Admitting: *Deleted

## 2020-05-05 NOTE — Telephone Encounter (Signed)
Call placed to patient.  Left detailed message, ok per dpr.  Advised surgery time has changed for 05/26/20.  Surgery time is now 11am, arrive at 9am.  Please return call to office if any additional questions.  Encounter closed.

## 2020-05-07 ENCOUNTER — Encounter: Payer: Self-pay | Admitting: Obstetrics and Gynecology

## 2020-05-07 ENCOUNTER — Other Ambulatory Visit: Payer: Self-pay

## 2020-05-07 ENCOUNTER — Ambulatory Visit (INDEPENDENT_AMBULATORY_CARE_PROVIDER_SITE_OTHER): Payer: Medicare Other | Admitting: Obstetrics and Gynecology

## 2020-05-07 VITALS — BP 122/64 | HR 65 | Ht 63.0 in | Wt 178.0 lb

## 2020-05-07 DIAGNOSIS — N840 Polyp of corpus uteri: Secondary | ICD-10-CM

## 2020-05-07 DIAGNOSIS — N950 Postmenopausal bleeding: Secondary | ICD-10-CM

## 2020-05-07 DIAGNOSIS — N882 Stricture and stenosis of cervix uteri: Secondary | ICD-10-CM

## 2020-05-07 MED ORDER — MISOPROSTOL 200 MCG PO TABS: ORAL_TABLET | ORAL | 0 refills | Status: DC

## 2020-05-07 NOTE — H&P (View-Only) (Signed)
GYNECOLOGY  VISIT   HPI: 87 y.o.   Widowed White or Caucasian Not Hispanic or Latino  female   G0P0000 with No LMP recorded (lmp unknown). Patient is postmenopausal.   here for a preoperative visit. She presented in 3/22 with PMP bleeding. Pap returned as normal. Endometrial biopsy returned with benign polyp and inactive endometrium.   She has a h/o breast cancer.   GYNECOLOGIC HISTORY: No LMP recorded (lmp unknown). Patient is postmenopausal. Contraception: PMP Menopausal hormone therapy: none        OB History    Gravida  0   Para  0   Term  0   Preterm  0   AB  0   Living  0     SAB  0   IAB  0   Ectopic  0   Multiple  0   Live Births  0              Patient Active Problem List   Diagnosis Date Noted  . History of total knee replacement, right 11/04/2019  . Pain in joint of left knee 11/04/2019  . Osteoarthritis of left knee 11/04/2019  . Carotid artery disease (HCC)   . ETD (Eustachian tube dysfunction), bilateral 05/10/2017  . Seasonal allergic rhinitis 05/10/2017  . Aortic stenosis, severe   . Rheumatic fever   . CAD (coronary artery disease), native coronary artery 10/20/2015  . Weakness 05/08/2015  . Breast cancer, right breast (HCC)   . PUD (peptic ulcer disease)   . Hypothyroidism   . Hypercholesteremia   . Obese 08/29/2013  . S/P right THA, AA 08/28/2013  . Chronic diastolic heart failure (HCC) 02/13/2013  . SVT (supraventricular tachycardia) (HCC) 10/24/2012    Past Medical History:  Diagnosis Date  . Aortic stenosis, severe    a.07/30/16 s/p TAVR // Echo 09/2018: EF 55-60, mild LVH, mild LAE, trace MR, mild TR, TAVR with no paravalvular leak and mean gradient 17 (increased from 13 one year ago)  . Breast cancer (HCC)    a. s/p lumpectomy, chemo/rad   . Breast cancer, right breast (HCC)   . CAD (coronary artery disease), native coronary artery 10/20/2015   a. mild non obst CAD  . Carotid artery disease (HCC)    Carotid US 09/2018:  Bilateral ICA 40-59 // Carotid US 11/21: Bilateral ICA 40-59; repeat 1 year   . CKD (chronic kidney disease), stage III (HCC)   . Diastolic dysfunction   . Hypercholesteremia   . Hypothyroidism   . Osteopenia   . PUD (peptic ulcer disease)   . Rheumatic fever   . SVT (supraventricular tachycardia) (HCC) October 2014   converted with vagal maneuver  . Vitamin D deficiency     Past Surgical History:  Procedure Laterality Date  . BREAST LUMPECTOMY Right   . BREAST SURGERY     RIGHT LUMPECTOMY AND REMOVAL OF LYMPH NODES  . CARDIAC CATHETERIZATION N/A 07/18/2015   Procedure: Right/Left Heart Cath and Coronary Angiography;  Surgeon: Christopher D McAlhany, MD;  [facility];  Service: Cardiovascular;  Laterality: N/A;  . CATARACT EXTRACTION W/ INTRAOCULAR LENS  IMPLANT, BILATERAL     2017  . CHOLECYSTECTOMY N/A 08/06/2014   Procedure: LAPAROSCOPIC CHOLECYSTECTOMY ;  Surgeon: Matthew Wakefield, MD;  [facility]: [facility];  Service: General;  Laterality: N/A;  . REPLACEMENT TOTAL KNEE Right 2009  . TEE WITHOUT CARDIOVERSION N/A 07/27/2016   Procedure: TRANSESOPHAGEAL ECHOCARDIOGRAM (TEE);  Surgeon: McAlhany, Christopher D, MD;  Location:   [place] [facility];  Service: Open Heart Surgery;  Laterality: N/A;  . THYROID SURGERY     NODULE REMOVED  . TOTAL HIP ARTHROPLASTY Left 2003  . TOTAL HIP ARTHROPLASTY Right 08/28/2013   Procedure: RIGHT TOTAL HIP ARTHROPLASTY ANTERIOR APPROACH;  Surgeon: Mauri Pole, MD;  Location: [facility];  Service: Orthopedics;  Laterality: Right;  . TRANSCATHETER AORTIC VALVE REPLACEMENT, TRANSFEMORAL N/A 07/27/2016   Procedure: TRANSCATHETER AORTIC VALVE REPLACEMENT, TRANSFEMORAL;  Surgeon: [name], MD;  Location: [place];  Service: Open Heart Surgery;  Laterality: N/A;    Current Outpatient Medications  Medication Sig Dispense Refill  . betamethasone valerate ointment (VALISONE) 0.1 % Apply 1 application topically 2 (two) times daily. Can use for up to 2  weeks as needed 15 g 0  . Calcium Carbonate-Vitamin D (CALCIUM 600/VITAMIN D PO) Take 1 tablet by mouth daily.    . Coenzyme Q10 (COQ10) 200 MG CAPS Take 200 mg by mouth daily.    . ergocalciferol (VITAMIN D2) 1.25 MG (50000 UT) capsule Take 50,000 Units by mouth once a week.     . fish oil-omega-3 fatty acids 1000 MG capsule Take 1 g by mouth daily.    . furosemide (LASIX) 20 MG tablet Take 20 mg by mouth as needed for fluid or edema.     . lansoprazole (PREVACID) 30 MG capsule Take 30 mg by mouth daily at 12 noon.     Marland Kitchen levothyroxine (SYNTHROID, LEVOTHROID) 112 MCG tablet Take 112 mcg by mouth daily before breakfast.    . LYSINE PO Take 1,000 mg by mouth daily.    . Magnesium 400 MG CAPS Take 400 mg by mouth daily.     . Menthol, Topical Analgesic, (BIOFREEZE EX) Apply 1 application topically as needed (pain).     . Multiple Vitamin (MULTIVITAMIN WITH MINERALS) TABS tablet Take 1 tablet by mouth daily.    . potassium chloride (K-DUR) 10 MEQ tablet Take 10 mEq by mouth as needed (with lasix).    . vitamin B-12 (CYANOCOBALAMIN) 1000 MCG tablet Take 1,000 mcg by mouth daily.    . vitamin C (ASCORBIC ACID) 500 MG tablet Take 500 mg by mouth daily.    . Zinc 50 MG TABS Take 1 tablet by mouth daily.    Marland Kitchen aspirin EC 81 MG tablet Take 81 mg by mouth daily.     No current facility-administered medications for this visit.     ALLERGIES: Atorvastatin, Compazine [prochlorperazine], Floxin [ofloxacin], Livalo [pitavastatin], Pravastatin sodium, Simvastatin, and Fenofibrate  Family History  Problem Relation Age of Onset  . Heart disease Mother   . Heart disease Father     Social History   Socioeconomic History  . Marital status: Widowed    Spouse name: Not on file  . Number of children: Not on file  . Years of education: Not on file  . Highest education level: Not on file  Occupational History  . Not on file  Tobacco Use  . Smoking status: Never Smoker  . Smokeless tobacco: Never Used   Vaping Use  . Vaping Use: Never used  Substance and Sexual Activity  . Alcohol use: No  . Drug use: No  . Sexual activity: Not Currently    Birth control/protection: Post-menopausal  Other Topics Concern  . Not on file  Social History Narrative  . Not on file   Social Determinants of Health   Financial Resource Strain: Not on file  Food Insecurity: Not on file  Transportation Needs: Not  on file  Physical Activity: Not on file  Stress: Not on file  Social Connections: Not on file  Intimate Partner Violence: Not on file    ROS  PHYSICAL EXAMINATION:    BP 122/64   Pulse 65   Ht 5' 3" (1.6 m)   Wt 178 lb (80.7 kg)   LMP  (LMP Unknown)   SpO2 98%   BMI 31.53 kg/m     General appearance: alert, cooperative and appears stated age Neck: no adenopathy, supple, symmetrical, trachea midline and thyroid normal to inspection and palpation Heart: regular rate and rhythm Lungs: CTAB Abdomen: soft, non-tender; bowel sounds normal; no masses,  no organomegaly Extremities: normal, atraumatic, no cyanosis Skin: normal color, texture and turgor, no rashes or lesions Lymph: normal cervical supraclavicular and inguinal nodes Neurologic: grossly normal    1. Post-menopausal bleeding Plan: hysteroscopy, polypectomy, dilation and curettage. Reviewed risks, including: bleeding, infection, uterine perforation, need for further sugery  2. Cervical stenosis (uterine cervix) Will pretreat with cytotec  3. Endometrial polyp Hysteroscopy as above.     

## 2020-05-07 NOTE — Progress Notes (Signed)
GYNECOLOGY  VISIT   HPI: 87 y.o.   Widowed White or Caucasian Not Hispanic or Latino  female   G0P0000 with No LMP recorded (lmp unknown). Patient is postmenopausal.   here for a preoperative visit. She presented in 3/22 with PMP bleeding. Pap returned as normal. Endometrial biopsy returned with benign polyp and inactive endometrium.   She has a h/o breast cancer.   GYNECOLOGIC HISTORY: No LMP recorded (lmp unknown). Patient is postmenopausal. Contraception: PMP Menopausal hormone therapy: none        OB History    Gravida  0   Para  0   Term  0   Preterm  0   AB  0   Living  0     SAB  0   IAB  0   Ectopic  0   Multiple  0   Live Births  0              Patient Active Problem List   Diagnosis Date Noted  . History of total knee replacement, right 11/04/2019  . Pain in joint of left knee 11/04/2019  . Osteoarthritis of left knee 11/04/2019  . Carotid artery disease ([place])   . ETD (Eustachian tube dysfunction), bilateral 05/10/2017  . Seasonal allergic rhinitis 05/10/2017  . Aortic stenosis, severe   . Rheumatic fever   . CAD (coronary artery disease), native coronary artery 10/20/2015  . Weakness 05/08/2015  . Breast cancer, right breast ([place])   . PUD (peptic ulcer disease)   . Hypothyroidism   . Hypercholesteremia   . Obese 08/29/2013  . S/P right THA, AA 08/28/2013  . Chronic diastolic heart failure ([place]) 02/13/2013  . SVT (supraventricular tachycardia) ([place]) 10/24/2012    Past Medical History:  Diagnosis Date  . Aortic stenosis, severe    a.07/30/16 s/p TAVR // Echo 09/2018: EF 55-60, mild LVH, mild LAE, trace MR, mild TR, TAVR with no paravalvular leak and mean gradient 17 (increased from 13 one year ago)  . Breast cancer ([place])    a. s/p lumpectomy, chemo/rad   . Breast cancer, right breast ([place])   . CAD (coronary artery disease), native coronary artery 10/20/2015   a. mild non obst CAD  . Carotid artery disease ([place])    Carotid US 09/2018:  Bilateral ICA 40-59 // Carotid US 11/21: Bilateral ICA 40-59; repeat 1 year   . CKD (chronic kidney disease), stage III ([place])   . Diastolic dysfunction   . Hypercholesteremia   . Hypothyroidism   . Osteopenia   . PUD (peptic ulcer disease)   . Rheumatic fever   . SVT (supraventricular tachycardia) [facility]) October 2014   converted with vagal maneuver  . Vitamin D deficiency     Past Surgical History:  Procedure Laterality Date  . BREAST LUMPECTOMY Right   . BREAST SURGERY     RIGHT LUMPECTOMY AND REMOVAL OF LYMPH NODES  . CARDIAC CATHETERIZATION N/A 07/18/2015   Procedure: Right/Left Heart Cath and Coronary Angiography;  Surgeon: [name], MD;  Location: [facility];  Service: Cardiovascular;  Laterality: N/A;  . CATARACT EXTRACTION W/ INTRAOCULAR LENS  IMPLANT, BILATERAL     2017  . CHOLECYSTECTOMY N/A 08/06/2014   Procedure: LAPAROSCOPIC CHOLECYSTECTOMY ;  Surgeon: Rolm Bookbinder, MD;  Location: [facility];  Service: General;  Laterality: N/A;  . REPLACEMENT TOTAL KNEE Right 2009  . TEE WITHOUT CARDIOVERSION N/A 07/27/2016   Procedure: TRANSESOPHAGEAL ECHOCARDIOGRAM (TEE);  Surgeon: [name], MD;  Location:  [place] [facility];  Service: Open Heart Surgery;  Laterality: N/A;  . THYROID SURGERY     NODULE REMOVED  . TOTAL HIP ARTHROPLASTY Left 2003  . TOTAL HIP ARTHROPLASTY Right 08/28/2013   Procedure: RIGHT TOTAL HIP ARTHROPLASTY ANTERIOR APPROACH;  Surgeon: Mauri Pole, MD;  Location: [facility];  Service: Orthopedics;  Laterality: Right;  . TRANSCATHETER AORTIC VALVE REPLACEMENT, TRANSFEMORAL N/A 07/27/2016   Procedure: TRANSCATHETER AORTIC VALVE REPLACEMENT, TRANSFEMORAL;  Surgeon: [name], MD;  Location: [facility];  Service: Open Heart Surgery;  Laterality: N/A;    Current Outpatient Medications  Medication Sig Dispense Refill  . betamethasone valerate ointment (VALISONE) 0.1 % Apply 1 application topically 2 (two) times daily. Can use for up to 2  weeks as needed 15 g 0  . Calcium Carbonate-Vitamin D (CALCIUM 600/VITAMIN D PO) Take 1 tablet by mouth daily.    . Coenzyme Q10 (COQ10) 200 MG CAPS Take 200 mg by mouth daily.    . ergocalciferol (VITAMIN D2) 1.25 MG (50000 UT) capsule Take 50,000 Units by mouth once a week.     . fish oil-omega-3 fatty acids 1000 MG capsule Take 1 g by mouth daily.    . furosemide (LASIX) 20 MG tablet Take 20 mg by mouth as needed for fluid or edema.     . lansoprazole (PREVACID) 30 MG capsule Take 30 mg by mouth daily at 12 noon.     Marland Kitchen levothyroxine (SYNTHROID, LEVOTHROID) 112 MCG tablet Take 112 mcg by mouth daily before breakfast.    . LYSINE PO Take 1,000 mg by mouth daily.    . Magnesium 400 MG CAPS Take 400 mg by mouth daily.     . Menthol, Topical Analgesic, (BIOFREEZE EX) Apply 1 application topically as needed (pain).     . Multiple Vitamin (MULTIVITAMIN WITH MINERALS) TABS tablet Take 1 tablet by mouth daily.    . potassium chloride (K-DUR) 10 MEQ tablet Take 10 mEq by mouth as needed (with lasix).    . vitamin B-12 (CYANOCOBALAMIN) 1000 MCG tablet Take 1,000 mcg by mouth daily.    . vitamin C (ASCORBIC ACID) 500 MG tablet Take 500 mg by mouth daily.    . Zinc 50 MG TABS Take 1 tablet by mouth daily.    Marland Kitchen aspirin EC 81 MG tablet Take 81 mg by mouth daily.     No current facility-administered medications for this visit.     ALLERGIES: Atorvastatin, Compazine [prochlorperazine], Floxin [ofloxacin], Livalo [pitavastatin], Pravastatin sodium, Simvastatin, and Fenofibrate  Family History  Problem Relation Age of Onset  . Heart disease Mother   . Heart disease Father     Social History   Socioeconomic History  . Marital status: Widowed    Spouse name: Not on file  . Number of children: Not on file  . Years of education: Not on file  . Highest education level: Not on file  Occupational History  . Not on file  Tobacco Use  . Smoking status: Never Smoker  . Smokeless tobacco: Never Used   Vaping Use  . Vaping Use: Never used  Substance and Sexual Activity  . Alcohol use: No  . Drug use: No  . Sexual activity: Not Currently    Birth control/protection: Post-menopausal  Other Topics Concern  . Not on file  Social History Narrative  . Not on file   Social Determinants of Health   Financial Resource Strain: Not on file  Food Insecurity: Not on file  Transportation Needs: Not  on file  Physical Activity: Not on file  Stress: Not on file  Social Connections: Not on file  Intimate Partner Violence: Not on file    ROS  PHYSICAL EXAMINATION:    BP 122/64   Pulse 65   Ht 5\' 3"  (1.6 m)   Wt 178 lb (80.7 kg)   LMP  (LMP Unknown)   SpO2 98%   BMI 31.53 kg/m     General appearance: alert, cooperative and appears stated age Neck: no adenopathy, supple, symmetrical, trachea midline and thyroid normal to inspection and palpation Heart: regular rate and rhythm Lungs: CTAB Abdomen: soft, non-tender; bowel sounds normal; no masses,  no organomegaly Extremities: normal, atraumatic, no cyanosis Skin: normal color, texture and turgor, no rashes or lesions Lymph: normal cervical supraclavicular and inguinal nodes Neurologic: grossly normal    1. Post-menopausal bleeding Plan: hysteroscopy, polypectomy, dilation and curettage. Reviewed risks, including: bleeding, infection, uterine perforation, need for further sugery  2. Cervical stenosis (uterine cervix) Will pretreat with cytotec  3. Endometrial polyp Hysteroscopy as above.

## 2020-05-08 ENCOUNTER — Encounter: Payer: Self-pay | Admitting: Obstetrics and Gynecology

## 2020-05-21 ENCOUNTER — Other Ambulatory Visit: Payer: Self-pay

## 2020-05-21 ENCOUNTER — Encounter (HOSPITAL_BASED_OUTPATIENT_CLINIC_OR_DEPARTMENT_OTHER): Payer: Self-pay | Admitting: Obstetrics and Gynecology

## 2020-05-21 NOTE — Progress Notes (Signed)
Spoke w/ via phone for pre-op interview--- Pt Lab needs dos---- Istat              Lab results------ current ekg in epic/ chart COVID test -----patient states asymptomatic no test needed  Arrive at -------  0900 on 05-26-2020 NPO after MN NO Solid Food.  Clear liquids from MN until--- 0800 Med rec completed Medications to take morning of surgery ----- Synthroid Diabetic medication ----- n/a  Patient instructed to bring photo id and insurance card day of surgery Patient aware to have Driver (ride ) / caregiver    for 24 hours after surgery -- [name] Patient Special Instructions ----- n/a   Pre-Op special Istructions ----- pt has telephone cardiac clearance by Almyra Deforest PA on 04-25-2020 in epic/ chart Patient verbalized understanding of instructions that were given at this phone interview. Patient denies shortness of breath, chest pain, fever, cough at this phone interview.   Anesthesia :  PSVT;  Mild nonobstructive CAD; s/p TAVR 07/ 2018 for severe stenosis post mean gradient 3mmHg;  CKD 3;  Pt denies any cardiac s&s ,  No peripheral swelling, but does get sob with walking which is her baseline.  PCP:  Dr Oneida Arenas Cardiologist : Dr Angelena Form (lov 02-08-2020 epic) Chest x-ray :  07-28-2016 EKG :  02-08-2020 epic Echo : 09-22-2018 epic Stress test:  07-14-2015 nuclear;  GXT 05-20-2016 epic Cardiac Cath :   07-18-2015 epic Activity level: sob w/ walking Sleep Study/ CPAP :  NO Blood Thinner/ Instructions Maryjane Hurter Dose: NO ASA / Instructions/ Last Dose : ASA 81mg ;  Pt stated was told by Dr Talbert Nan to continue ASA , does not need to stop prior to surgery.

## 2020-05-22 ENCOUNTER — Other Ambulatory Visit (HOSPITAL_COMMUNITY): Payer: Medicare Other

## 2020-05-26 ENCOUNTER — Ambulatory Visit (HOSPITAL_BASED_OUTPATIENT_CLINIC_OR_DEPARTMENT_OTHER): Payer: Medicare Other | Admitting: Anesthesiology

## 2020-05-26 ENCOUNTER — Other Ambulatory Visit: Payer: Self-pay

## 2020-05-26 ENCOUNTER — Ambulatory Visit (HOSPITAL_BASED_OUTPATIENT_CLINIC_OR_DEPARTMENT_OTHER)
Admission: RE | Admit: 2020-05-26 | Discharge: 2020-05-26 | Disposition: A | Discharge status: Home / Self Care | Payer: Medicare Other | Attending: Obstetrics and Gynecology | Admitting: Obstetrics and Gynecology

## 2020-05-26 ENCOUNTER — Encounter (HOSPITAL_BASED_OUTPATIENT_CLINIC_OR_DEPARTMENT_OTHER): Payer: Self-pay | Admitting: Obstetrics and Gynecology

## 2020-05-26 ENCOUNTER — Encounter (HOSPITAL_BASED_OUTPATIENT_CLINIC_OR_DEPARTMENT_OTHER)
Admission: RE | Disposition: A | Discharge status: Home / Self Care | Payer: Self-pay | Source: Home / Self Care | Attending: Obstetrics and Gynecology

## 2020-05-26 DIAGNOSIS — Z853 Personal history of malignant neoplasm of breast: Secondary | ICD-10-CM | POA: Insufficient documentation

## 2020-05-26 DIAGNOSIS — M1712 Unilateral primary osteoarthritis, left knee: Secondary | ICD-10-CM | POA: Diagnosis not present

## 2020-05-26 DIAGNOSIS — Z79899 Other long term (current) drug therapy: Secondary | ICD-10-CM | POA: Insufficient documentation

## 2020-05-26 DIAGNOSIS — Z7982 Long term (current) use of aspirin: Secondary | ICD-10-CM | POA: Diagnosis not present

## 2020-05-26 DIAGNOSIS — Z79890 Hormone replacement therapy (postmenopausal): Secondary | ICD-10-CM | POA: Diagnosis not present

## 2020-05-26 DIAGNOSIS — I2510 Atherosclerotic heart disease of native coronary artery without angina pectoris: Secondary | ICD-10-CM | POA: Diagnosis not present

## 2020-05-26 DIAGNOSIS — N183 Chronic kidney disease, stage 3 unspecified: Secondary | ICD-10-CM | POA: Diagnosis not present

## 2020-05-26 DIAGNOSIS — R9389 Abnormal findings on diagnostic imaging of other specified body structures: Secondary | ICD-10-CM | POA: Insufficient documentation

## 2020-05-26 DIAGNOSIS — N950 Postmenopausal bleeding: Secondary | ICD-10-CM | POA: Diagnosis present

## 2020-05-26 DIAGNOSIS — Z888 Allergy status to other drugs, medicaments and biological substances status: Secondary | ICD-10-CM | POA: Diagnosis not present

## 2020-05-26 DIAGNOSIS — E7800 Pure hypercholesterolemia, unspecified: Secondary | ICD-10-CM | POA: Diagnosis not present

## 2020-05-26 DIAGNOSIS — Z881 Allergy status to other antibiotic agents status: Secondary | ICD-10-CM | POA: Diagnosis not present

## 2020-05-26 DIAGNOSIS — Z8249 Family history of ischemic heart disease and other diseases of the circulatory system: Secondary | ICD-10-CM | POA: Diagnosis not present

## 2020-05-26 DIAGNOSIS — Z952 Presence of prosthetic heart valve: Secondary | ICD-10-CM | POA: Diagnosis not present

## 2020-05-26 DIAGNOSIS — N840 Polyp of corpus uteri: Secondary | ICD-10-CM | POA: Insufficient documentation

## 2020-05-26 DIAGNOSIS — I5032 Chronic diastolic (congestive) heart failure: Secondary | ICD-10-CM | POA: Diagnosis not present

## 2020-05-26 DIAGNOSIS — E039 Hypothyroidism, unspecified: Secondary | ICD-10-CM | POA: Insufficient documentation

## 2020-05-26 HISTORY — DX: Personal history of peptic ulcer disease: Z87.11

## 2020-05-26 HISTORY — DX: Postmenopausal bleeding: N95.0

## 2020-05-26 HISTORY — DX: Chronic diastolic (congestive) heart failure: I50.32

## 2020-05-26 HISTORY — PX: HYSTEROSCOPY WITH D & C: SHX1775

## 2020-05-26 HISTORY — DX: Personal history of other diseases of the circulatory system: Z86.79

## 2020-05-26 HISTORY — DX: Hyperlipidemia, unspecified: E78.5

## 2020-05-26 HISTORY — DX: Other seasonal allergic rhinitis: J30.2

## 2020-05-26 HISTORY — DX: Age-related osteoporosis without current pathological fracture: M81.0

## 2020-05-26 LAB — POCT I-STAT, CHEM 8
BUN: 25 mg/dL — ABNORMAL HIGH (ref 8–23)
Calcium, Ion: 1.24 mmol/L (ref 1.15–1.40)
Chloride: 98 mmol/L (ref 98–111)
Creatinine, Ser: 0.9 mg/dL (ref 0.44–1.00)
Glucose, Bld: 99 mg/dL (ref 70–99)
HCT: 44 % (ref 36.0–46.0)
Hemoglobin: 15 g/dL (ref 12.0–15.0)
Potassium: 4.3 mmol/L (ref 3.5–5.1)
Sodium: 138 mmol/L (ref 135–145)
TCO2: 30 mmol/L (ref 22–32)

## 2020-05-26 SURGERY — DILATATION AND CURETTAGE /HYSTEROSCOPY
Anesthesia: General

## 2020-05-26 MED ORDER — PROPOFOL 10 MG/ML IV BOLUS
INTRAVENOUS | Status: DC | PRN
  Administered 2020-05-26: 130 mg via INTRAVENOUS
  Administered 2020-05-26 (×2): 30 mg via INTRAVENOUS

## 2020-05-26 MED ORDER — SODIUM CHLORIDE 0.9 % IV SOLN: INTRAVENOUS | Status: DC

## 2020-05-26 MED ORDER — LIDOCAINE 2% (20 MG/ML) 5 ML SYRINGE
INTRAMUSCULAR | Status: DC | PRN
  Administered 2020-05-26: 60 mg via INTRAVENOUS

## 2020-05-26 MED ORDER — ONDANSETRON HCL 4 MG/2ML IJ SOLN
INTRAMUSCULAR | Status: DC | PRN
  Administered 2020-05-26: 4 mg via INTRAVENOUS

## 2020-05-26 MED ORDER — EPHEDRINE 5 MG/ML INJ
INTRAVENOUS | Status: AC
  Filled 2020-05-26: qty 10

## 2020-05-26 MED ORDER — PHENYLEPHRINE HCL (PRESSORS) 10 MG/ML IV SOLN
INTRAVENOUS | Status: DC | PRN
  Administered 2020-05-26 (×3): 80 ug via INTRAVENOUS

## 2020-05-26 MED ORDER — DEXAMETHASONE SODIUM PHOSPHATE 10 MG/ML IJ SOLN
INTRAMUSCULAR | Status: AC
  Filled 2020-05-26: qty 1

## 2020-05-26 MED ORDER — POVIDONE-IODINE 10 % EX SWAB: 2.0000 "application " | Freq: Once | CUTANEOUS | Status: DC

## 2020-05-26 MED ORDER — LIDOCAINE 2% (20 MG/ML) 5 ML SYRINGE
INTRAMUSCULAR | Status: AC
  Filled 2020-05-26: qty 5

## 2020-05-26 MED ORDER — FENTANYL CITRATE (PF) 100 MCG/2ML IJ SOLN: 25.0000 ug | INTRAMUSCULAR | Status: DC | PRN

## 2020-05-26 MED ORDER — PROPOFOL 10 MG/ML IV BOLUS
INTRAVENOUS | Status: AC
  Filled 2020-05-26: qty 40

## 2020-05-26 MED ORDER — PHENYLEPHRINE 40 MCG/ML (10ML) SYRINGE FOR IV PUSH (FOR BLOOD PRESSURE SUPPORT)
PREFILLED_SYRINGE | INTRAVENOUS | Status: AC
  Filled 2020-05-26: qty 10

## 2020-05-26 MED ORDER — ONDANSETRON HCL 4 MG/2ML IJ SOLN
INTRAMUSCULAR | Status: AC
  Filled 2020-05-26: qty 2

## 2020-05-26 MED ORDER — DEXAMETHASONE SODIUM PHOSPHATE 10 MG/ML IJ SOLN
INTRAMUSCULAR | Status: DC | PRN
  Administered 2020-05-26: 10 mg via INTRAVENOUS

## 2020-05-26 MED ORDER — ACETAMINOPHEN 500 MG PO TABS
1000.0000 mg | ORAL_TABLET | Freq: Once | ORAL | Status: AC
  Administered 2020-05-26: 1000 mg via ORAL

## 2020-05-26 MED ORDER — FENTANYL CITRATE (PF) 100 MCG/2ML IJ SOLN
INTRAMUSCULAR | Status: DC | PRN
  Administered 2020-05-26: 50 ug via INTRAVENOUS

## 2020-05-26 MED ORDER — FENTANYL CITRATE (PF) 100 MCG/2ML IJ SOLN
INTRAMUSCULAR | Status: AC
  Filled 2020-05-26: qty 2

## 2020-05-26 MED ORDER — LACTATED RINGERS IV SOLN: INTRAVENOUS | Status: DC

## 2020-05-26 MED ORDER — EPHEDRINE SULFATE 50 MG/ML IJ SOLN
INTRAMUSCULAR | Status: DC | PRN
  Administered 2020-05-26 (×5): 10 mg via INTRAVENOUS

## 2020-05-26 MED ORDER — ONDANSETRON HCL 4 MG/2ML IJ SOLN: 4.0000 mg | Freq: Once | INTRAMUSCULAR | Status: DC | PRN

## 2020-05-26 MED ORDER — ACETAMINOPHEN 500 MG PO TABS
ORAL_TABLET | ORAL | Status: AC
  Filled 2020-05-26: qty 2

## 2020-05-26 MED ORDER — SODIUM CHLORIDE 0.9 % IR SOLN
Status: DC | PRN
  Administered 2020-05-26: 3000 mL

## 2020-05-26 SURGICAL SUPPLY — 23 items
CATH ROBINSON RED A/P 16FR (CATHETERS) IMPLANT
COVER WAND RF STERILE (DRAPES) ×2 IMPLANT
DEVICE MYOSURE LITE (MISCELLANEOUS) IMPLANT
DEVICE MYOSURE REACH (MISCELLANEOUS) ×1 IMPLANT
DILATOR CANAL MILEX (MISCELLANEOUS) IMPLANT
GAUZE 4X4 16PLY RFD (DISPOSABLE) ×2 IMPLANT
GLOVE SURG ENC MOIS LTX SZ6.5 (GLOVE) ×2 IMPLANT
GOWN STRL REUS W/TWL LRG LVL3 (GOWN DISPOSABLE) ×2 IMPLANT
IV NS IRRIG 3000ML ARTHROMATIC (IV SOLUTION) ×2 IMPLANT
KIT PROCEDURE FLUENT (KITS) ×2 IMPLANT
KIT TURNOVER CYSTO (KITS) ×2 IMPLANT
MYOSURE XL FIBROID (MISCELLANEOUS)
PACK VAGINAL MINOR WOMEN LF (CUSTOM PROCEDURE TRAY) ×2 IMPLANT
PAD OB MATERNITY 4.3X12.25 (PERSONAL CARE ITEMS) ×2 IMPLANT
PAD PREP 24X48 CUFFED NSTRL (MISCELLANEOUS) ×2 IMPLANT
SCOPETTES 8  STERILE (MISCELLANEOUS) ×2
SCOPETTES 8 STERILE (MISCELLANEOUS) IMPLANT
SEAL CERVICAL OMNI LOK (ABLATOR) IMPLANT
SEAL ROD LENS SCOPE MYOSURE (ABLATOR) ×2 IMPLANT
SYR 20ML LL LF (SYRINGE) IMPLANT
SYSTEM TISS REMOVAL MYOSURE XL (MISCELLANEOUS) IMPLANT
TOWEL OR 17X26 10 PK STRL BLUE (TOWEL DISPOSABLE) ×2 IMPLANT
WATER STERILE IRR 500ML POUR (IV SOLUTION) IMPLANT

## 2020-05-26 NOTE — Anesthesia Procedure Notes (Signed)
Procedure Name: LMA Insertion Date/Time: 05/26/2020 10:22 AM Performed by: Mechele Claude, CRNA Pre-anesthesia Checklist: Patient identified, Emergency Drugs available, Suction available and Patient being monitored Patient Re-evaluated:Patient Re-evaluated prior to induction Oxygen Delivery Method: Circle system utilized Preoxygenation: Pre-oxygenation with 100% oxygen Induction Type: IV induction Ventilation: Mask ventilation without difficulty LMA: LMA inserted LMA Size: 4.0 Number of attempts: 1 Airway Equipment and Method: Bite block Placement Confirmation: positive ETCO2 Tube secured with: Tape Dental Injury: Teeth and Oropharynx as per pre-operative assessment

## 2020-05-26 NOTE — Discharge Instructions (Signed)
°  Post Anesthesia Home Care Instructions ° °Activity: °Get plenty of rest for the remainder of the day. A responsible adult should stay with you for 24 hours following the procedure.  °For the next 24 hours, DO NOT: °-Drive a car °-Operate machinery °-Drink alcoholic beverages °-Take any medication unless instructed by your physician °-Make any legal decisions or sign important papers. ° °Meals: °Start with liquid foods such as gelatin or soup. Progress to regular foods as tolerated. Avoid greasy, spicy, heavy foods. If nausea and/or vomiting occur, drink only clear liquids until the nausea and/or vomiting subsides. Call your physician if vomiting continues. ° °Special Instructions/Symptoms: °Your throat may feel dry or sore from the anesthesia or the breathing tube placed in your throat during surgery. If this causes discomfort, gargle with warm salt water. The discomfort should disappear within 24 hours. ° °If you had a scopolamine patch placed behind your ear for the management of post- operative nausea and/or vomiting: ° °1. The medication in the patch is effective for 72 hours, after which it should be removed.  Wrap patch in a tissue and discard in the trash. Wash hands thoroughly with soap and water. °2. You may remove the patch earlier than 72 hours if you experience unpleasant side effects which may include dry mouth, dizziness or visual disturbances. °3. Avoid touching the patch. Wash your hands with soap and water after contact with the patch. °  ° ° °D [facility] Home care Instructions: ° ° °Personal hygiene:  Used sanitary napkins for vaginal drainage not tampons. Shower or tub bathe the day after your procedure. No douching until bleeding stops. Always wipe from front to back after  Elimination. ° °Activity: Do not drive or operate any equipment today. The effects of the anesthesia are still present and drowsiness may result. Rest today, not necessarily flat bed rest, just take it easy. You may resume your  normal activity in one to 2 days. ° °Sexual activity: No intercourse for one week or as indicated by your physician ° °Diet: Eat a light diet as desired this evening. You may resume a regular diet tomorrow. ° °Return to work: One to 2 days. ° °General Expectations of your surgery: Vaginal bleeding should be no heavier than a normal period. Spotting may continue up to 10 days. Mild cramps may continue for a couple of days. You may have a regular period in 2-6 weeks. ° °Unexpected observations call your doctor if these occur: persistent or heavy bleeding. Severe abdominal cramping or pain. Elevation of temperature greater than 100°F. ° °Call for an appointment in one week. ° ° ° °Patient's Signature_______________________________________________________ ° °Nurse's Signature________________________________________________________ °

## 2020-05-26 NOTE — Op Note (Signed)
Preoperative Diagnosis: Postmenopausal bleeding, endometrial polyp  Postoperative Diagnosis: Postmenopausal bleeding, endometrial polyp, endometrial thickening  Procedure: Hysteroscopy, dilation and curettage  Surgeon: Dr Sumner Boast  Assistants: None  Anesthesia: General via LMA  EBL: 5 cc  Fluids: 500 cc  Fluid deficit: 75 cc  Urine output: not recorded  Indications for surgery: The patient is a 87 yo female, who presented with postmenopausal bleeding. Work up included a normal pap smear and an endometrial biopsy with inactive endometrium and endometrial polyp. The risks of the surgery were reviewed with the patient and the consent form was signed prior to her surgery. She was noted to have cervical stenosis at her first visit and has been pretreated with cytotec.   Findings: Hysteroscopy: normal tubal ostia bilaterally, small sessile polyp, few areas of endometrial thickening, small myoma deviating the cavity slightly   Specimens: endometrial sample, endometrial curettings.    Procedure: The patient was taken to the operating room with an IV in place. She was placed in the dorsal lithotomy position and anesthesia was administered. She was prepped and draped in the usual sterile fashion for a vaginal procedure. She voided on the way to the OR. A weighted speculum was placed in the vagina and a single tooth tenaculum was placed on the anterior lip of the cervix. The cervix was dilated to a #6 hagar dilator. The uterus was sounded to 7 cm. The myosure hysteroscope was inserted into the uterine cavity. With continuous infusion of normal saline, the uterine cavity was visualized with the above findings. The myosure reach was used to resect the endometrial polyps, remove the areas of thickening and remove the visible fibroid. The myosure was then removed. The cavity was then curetted with the small sharp curette. The cavity had the characteristically gritty texture at the end of the  procedure. The curette and the single tooth tenaculum were removed. The speculum was removed. The patients perineum was cleansed of betadine and she was taken out of the dorsal lithotomy position.  Upon awakening the LMA was removed and the patient was transferred to the recovery room in stable and awake condition.  The sponge and instrument count were correct. There were no complications.

## 2020-05-26 NOTE — Anesthesia Postprocedure Evaluation (Signed)
Anesthesia Post Note  Patient: [name]  Procedure(s) Performed: DILATATION AND CURETTAGE /HYSTEROSCOPY (N/A )     Patient location during evaluation: PACU Anesthesia Type: General Level of consciousness: awake and alert, oriented and patient cooperative Pain management: pain level controlled Vital Signs Assessment: post-procedure vital signs reviewed and stable Respiratory status: spontaneous breathing, nonlabored ventilation and respiratory function stable Cardiovascular status: blood pressure returned to baseline and stable Postop Assessment: no apparent nausea or vomiting Anesthetic complications: no   No complications documented.  Last Vitals:  Vitals:   05/26/20 0930 05/26/20 1100  BP: (!) 148/71 (!) 125/57  Pulse: 69 73  Resp:  13  Temp: 36.6 C 36.6 C  SpO2: 98% 100%    Last Pain:  Vitals:   05/26/20 1100  TempSrc:   PainSc: 0-No pain                 Pervis Hocking

## 2020-05-26 NOTE — Anesthesia Preprocedure Evaluation (Addendum)
Anesthesia Evaluation  Patient identified by MRN, date of birth, ID band Patient awake    Reviewed: Allergy & Precautions, NPO status , Patient's Chart, lab work & pertinent test results  Airway Mallampati: II  TM Distance: >3 FB Neck ROM: Full    Dental no notable dental hx.    Pulmonary neg pulmonary ROS,    Pulmonary exam normal breath sounds clear to auscultation       Cardiovascular + CAD (non-obstructive on cath 2017) and +CHF (diastolic)  Normal cardiovascular exam+ Valvular Problems/Murmurs (s/p TAVR 2019)  Rhythm:Regular Rate:Normal  Echo 2020: 1. Left ventricular ejection fraction, by visual estimation, is 55 to  60%. The left ventricle has normal function. Left ventricular septal wall  thickness was mildly increased. There is mildly increased left ventricular  hypertrophy.  2. Global right ventricle has normal systolic function.The right  ventricular size is normal. No increase in right ventricular wall  thickness.  3. Left atrial size was mildly dilated.  4. Right atrial size was normal.  5. The mitral valve is normal in structure. Trace mitral valve  regurgitation.  6. The tricuspid valve is normal in structure. Tricuspid valve  regurgitation is mild.  7. Aortic valve regurgitation was not visualized by color flow Doppler.  8. 26 mm Sapien 3 TAVR valve no PVL previous mean gradient July 2019 was  13 mmHg mildly higher this exam at 17 mmHg.  9. The pulmonic valve was grossly normal. Pulmonic valve regurgitation is  trivial by color flow Doppler.    Neuro/Psych negative neurological ROS  negative psych ROS   GI/Hepatic Neg liver ROS, PUD,   Endo/Other  Hypothyroidism   Renal/GU CRF and Renal InsufficiencyRenal diseaseCKD 3  Female GU complaint (PMB, endometrial polyp)     Musculoskeletal  (+) Arthritis , Osteoarthritis,    Abdominal   Peds  Hematology negative hematology ROS (+)    Anesthesia Other Findings Restricted R arm s/p mastectomy and LNs  Reproductive/Obstetrics negative OB ROS                            Anesthesia Physical Anesthesia Plan  ASA: II  Anesthesia Plan: General   Post-op Pain Management:    Induction: Intravenous  PONV Risk Score and Plan: 3 and Ondansetron, Dexamethasone and Treatment may vary due to age or medical condition  Airway Management Planned: LMA  Additional Equipment: None  Intra-op Plan:   Post-operative Plan: Extubation in OR  Informed Consent: I have reviewed the patients History and Physical, chart, labs and discussed the procedure including the risks, benefits and alternatives for the proposed anesthesia with the patient or authorized representative who has indicated his/her understanding and acceptance.     Dental advisory given  Plan Discussed with: CRNA  Anesthesia Plan Comments:        Anesthesia Quick Evaluation

## 2020-05-26 NOTE — Transfer of Care (Signed)
Immediate Anesthesia Transfer of Care Note  Patient: [name]  Procedure(s) Performed: Procedure(s) (LRB): DILATATION AND CURETTAGE /HYSTEROSCOPY (N/A)  Patient Location: PACU  Anesthesia Type: General  Level of Consciousness: awake, alert  and oriented  Airway & Oxygen Therapy: Patient Spontanous Breathing and Patient connected to nasal cannula  oxygen  Post-op Assessment: Report given to PACU RN and Post -op Vital signs reviewed and stable  Post vital signs: Reviewed and stable  Complications: No apparent anesthesia complications  Last Vitals:  Vitals Value Taken Time  BP 125/57 05/26/20 1100  Temp    Pulse 70 05/26/20 1100  Resp 14 05/26/20 1100  SpO2 99 % 05/26/20 1100  Vitals shown include unvalidated device data.  Last Pain:  Vitals:   05/26/20 0930  TempSrc: Oral  PainSc: 0-No pain      Patients Stated Pain Goal: 4 (75/17/00 1749)  Complications: No complications documented.

## 2020-05-26 NOTE — Interval H&P Note (Signed)
History and Physical Interval Note:  05/26/2020 10:06 AM  [name]  has presented today for surgery, with the diagnosis of Postmenopausal bleeding, endometrial polyp.  The various methods of treatment have been discussed with the patient and family. After consideration of risks, benefits and other options for treatment, the patient has consented to  Procedure(s) with comments: DILATATION AND CURETTAGE /HYSTEROSCOPY (N/A) - Request case for [facility] as a surgical intervention.  The patient's history has been reviewed, patient examined, no change in status, stable for surgery.  I have reviewed the patient's chart and labs.  Questions were answered to the patient's satisfaction.     Salvadore Dom

## 2020-05-27 ENCOUNTER — Encounter (HOSPITAL_BASED_OUTPATIENT_CLINIC_OR_DEPARTMENT_OTHER): Payer: Self-pay | Admitting: Obstetrics and Gynecology

## 2020-05-27 LAB — SURGICAL PATHOLOGY

## 2020-06-05 ENCOUNTER — Other Ambulatory Visit: Payer: Self-pay

## 2020-06-09 ENCOUNTER — Ambulatory Visit: Payer: Self-pay | Admitting: Obstetrics and Gynecology

## 2020-06-13 ENCOUNTER — Ambulatory Visit (INDEPENDENT_AMBULATORY_CARE_PROVIDER_SITE_OTHER): Payer: Medicare Other | Admitting: Obstetrics and Gynecology

## 2020-06-13 ENCOUNTER — Other Ambulatory Visit: Payer: Self-pay

## 2020-06-13 ENCOUNTER — Encounter: Payer: Self-pay | Admitting: Obstetrics and Gynecology

## 2020-06-13 VITALS — BP 130/70 | HR 68 | Ht 62.0 in | Wt 178.0 lb

## 2020-06-13 DIAGNOSIS — N644 Mastodynia: Secondary | ICD-10-CM | POA: Diagnosis not present

## 2020-06-13 DIAGNOSIS — Z853 Personal history of malignant neoplasm of breast: Secondary | ICD-10-CM | POA: Diagnosis not present

## 2020-06-13 DIAGNOSIS — Z9889 Other specified postprocedural states: Secondary | ICD-10-CM

## 2020-06-13 DIAGNOSIS — I2510 Atherosclerotic heart disease of native coronary artery without angina pectoris: Secondary | ICD-10-CM | POA: Diagnosis not present

## 2020-06-13 NOTE — Progress Notes (Signed)
GYNECOLOGY  VISIT   HPI: 87 y.o.   Widowed White or Caucasian Not Hispanic or Latino  female   [id number] with No LMP recorded (lmp unknown). Patient is postmenopausal.   here for 2 week post op s/p hysteroscopy, D&C. Pathology was benign. She had minimal spotting for up to 2 weeks, gone now. She also have an irritation on her right breast, started this week. She started using a steroid cream which is helping. Feels like it's stinging, not itchy. H/O breast cancer in that breast. The discomfort feels superficial.   GYNECOLOGIC HISTORY: No LMP recorded (lmp unknown). Patient is postmenopausal. Contraception:PMP Menopausal hormone therapy: none         OB History     Gravida  0   Para  0   Term  0   Preterm  0   AB  0   Living  0      SAB  0   IAB  0   Ectopic  0   Multiple  0   Live Births  0              Patient Active Problem List   Diagnosis Date Noted   History of total knee replacement, right 11/04/2019   Pain in joint of left knee 11/04/2019   Osteoarthritis of left knee 11/04/2019   Carotid artery disease ([place])    ETD (Eustachian tube dysfunction), bilateral 05/10/2017   Seasonal allergic rhinitis 05/10/2017   Aortic stenosis, severe    Rheumatic fever    CAD (coronary artery disease), native coronary artery 10/20/2015   Weakness 05/08/2015   Breast cancer, right breast ([place])    PUD (peptic ulcer disease)    Hypothyroidism    Hypercholesteremia    Obese 08/29/2013   S/P right THA, AA 08/28/2013   Chronic diastolic heart failure ([place]) 02/13/2013   SVT (supraventricular tachycardia) ([place]) 10/24/2012    Past Medical History:  Diagnosis Date   Arthritis    hands, knees   CAD (coronary artery disease), native coronary artery cardiologist--- dr Angelena Form   a. mild non obst CAD per cath 07-18-2015   Carotid artery disease ([place])    Carotid US 09/2018: Bilateral ICA 40-59 // Carotid US 11/21: Bilateral ICA 40-59; repeat 1 year    Chronic diastolic CHF  (congestive heart failure) ([place])    followed by cardiology   CKD (chronic kidney disease), stage III ([place])    followed by pcp   Dyspnea    per pt when she walks which is usual for her   History of gastric ulcer    remote yrs ago   History of rheumatic fever as a child    History of right breast cancer 2006   s/p right partial mastectomy w/ node dissection's,  completed chemo/ radiation same year  (05-21-2020 pt stated no recurrence)   Hyperlipidemia    Hypothyroidism    followed by pcp   Osteoporosis    PMB (postmenopausal bleeding)    PSVT (paroxysmal supraventricular tachycardia) ([place]) 10/2012   in ED resolved w/ vagal maneuver    S/P aortic valve replacement 07/27/2016   for severe stenosis;  last echo in epic 09-22-2018 ef 55-60%, mild TR, aortic valve area 1.4cm^2 and mean gradiant 17 mmHg   Seasonal allergies    Vitamin D deficiency     Past Surgical History:  Procedure Laterality Date   CARDIAC CATHETERIZATION N/A 07/18/2015   Procedure: Right/Left Heart Cath and Coronary Angiography;  Surgeon: [name], MD;  Location: [facility];  Service: Cardiovascular;  Laterality: N/A;   CATARACT EXTRACTION W/ INTRAOCULAR LENS  IMPLANT, BILATERAL     2017   CHOLECYSTECTOMY N/A 08/06/2014   Procedure: LAPAROSCOPIC CHOLECYSTECTOMY ;  Surgeon: Rolm Bookbinder, MD;  Location: [facility];  Service: General;  Laterality: N/A;   HYSTEROSCOPY WITH D & C N/A 05/26/2020   Procedure: DILATATION AND CURETTAGE /HYSTEROSCOPY;  Surgeon: Salvadore Dom, MD;  Location: [facility];  Service: Gynecology;  Laterality: N/A;  Request case for [place]   PARTIAL MASTECTOMY WITH AXILLARY SENTINEL LYMPH NODE BIOPSY Right 2006   TEE WITHOUT CARDIOVERSION N/A 07/27/2016   Procedure: TRANSESOPHAGEAL ECHOCARDIOGRAM (TEE);  Surgeon: [name], MD;  Location: [facility];  Service: Open Heart Surgery;  Laterality: N/A;   THYROID SURGERY  1969   NODULE REMOVED, benign per pt    TONSILLECTOMY  age 9   TOTAL HIP ARTHROPLASTY Left 2003   TOTAL HIP ARTHROPLASTY Right 08/28/2013   Procedure: RIGHT TOTAL HIP ARTHROPLASTY ANTERIOR APPROACH;  Surgeon: Mauri Pole, MD;  Location: [facility];  Service: Orthopedics;  Laterality: Right;   TOTAL KNEE ARTHROPLASTY Right 01-16-2007  @[facility]    TRANSCATHETER AORTIC VALVE REPLACEMENT, TRANSFEMORAL N/A 07/27/2016   Procedure: TRANSCATHETER AORTIC VALVE REPLACEMENT, TRANSFEMORAL;  Surgeon: [name], MD;  Location: [facility];  Service: Open Heart Surgery;  Laterality: N/A;    Current Outpatient Medications  Medication Sig Dispense Refill   aspirin EC 81 MG tablet Take 81 mg by mouth daily.     Calcium Carbonate-Vitamin D (CALCIUM 600/VITAMIN D PO) Take 1 tablet by mouth daily.     Coenzyme Q10 (COQ10) 200 MG CAPS Take 200 mg by mouth daily.     ergocalciferol (VITAMIN D2) 1.25 MG (50000 UT) capsule Take 50,000 Units by mouth once a week. Saturday's     fish oil-omega-3 fatty acids 1000 MG capsule Take 1 g by mouth daily.     furosemide (LASIX) 20 MG tablet Take 20 mg by mouth as needed for fluid or edema.      levothyroxine (SYNTHROID, LEVOTHROID) 112 MCG tablet Take 112 mcg by mouth daily before breakfast.     LYSINE PO Take 1,000 mg by mouth daily.     Magnesium 400 MG CAPS Take 400 mg by mouth daily.      Menthol, Topical Analgesic, (BIOFREEZE EX) Apply 1 application topically as needed (pain).      Multiple Vitamin (MULTIVITAMIN WITH MINERALS) TABS tablet Take 1 tablet by mouth daily.     potassium chloride (K-DUR) 10 MEQ tablet Take 10 mEq by mouth as needed (with lasix).     vitamin B-12 (CYANOCOBALAMIN) 1000 MCG tablet Take 1,000 mcg by mouth daily.     vitamin C (ASCORBIC ACID) 500 MG tablet Take 500 mg by mouth daily.     Zinc 50 MG TABS Take 1 tablet by mouth daily.     No current facility-administered medications for this visit.     ALLERGIES: Atorvastatin, Compazine [prochlorperazine], Floxin [ofloxacin], Livalo  [pitavastatin], Pravastatin sodium, Simvastatin, and Fenofibrate  Family History  Problem Relation Age of Onset   Heart disease Mother    Heart disease Father     Social History   Socioeconomic History   Marital status: Widowed    Spouse name: Not on file   Number of children: Not on file   Years of education: Not on file   Highest education level: Not on file  Occupational History   Not  on file  Tobacco Use   Smoking status: Never   Smokeless tobacco: Never  Vaping Use   Vaping Use: Never used  Substance and Sexual Activity   Alcohol use: No   Drug use: Never   Sexual activity: Not Currently    Birth control/protection: Post-menopausal  Other Topics Concern   Not on file  Social History Narrative   Not on file   Social Determinants of Health   Financial Resource Strain: Not on file  Food Insecurity: Not on file  Transportation Needs: Not on file  Physical Activity: Not on file  Stress: Not on file  Social Connections: Not on file  Intimate Partner Violence: Not on file    Review of Systems  All other systems reviewed and are negative.  PHYSICAL EXAMINATION:    BP 130/70   Pulse 68   Ht 5\' 2"  (1.575 m)   Wt 178 lb (80.7 kg)   LMP  (LMP Unknown)   SpO2 100%   BMI 32.56 kg/m     General appearance: alert, cooperative and appears stated age Breasts:  evidence of right lumpectomy and radiation treatment. She is tender in the upper outer portion of her right breast, no lumps, the skin appears thin but no rashes or obvious irritation.  No axillary adenopathy.  1. History of hysteroscopy Benign pathology  2. Breast pain No concerning findings on exam Last mammogram in 12/21 Discussed cutting back on caffeine, taking tylenol or ibuprofen, ice. If not better in 2 weeks she will call and I will schedule her for diagnostic breast imaging  3. History of right breast cancer

## 2020-06-13 NOTE — Patient Instructions (Signed)
Breast Tenderness Breast tenderness is a common problem for women of all ages, but may also occur in men. Breast tenderness may range from mild discomfort to severe pain. In women, the pain usually comes and goes with the menstrual cycle, but it can also be constant. Breast tenderness has many possible causes, including hormone changes, infections, and taking certain medicines. You may have tests, such as a mammogram or an ultrasound, to check for any unusual findings. Having breast tenderness usually does not mean that you have breast cancer. Follow these instructions at home: Managing pain and discomfort  If directed, put ice to the painful area. To do this: Put ice in a plastic bag. Place a towel between your skin and the bag. Leave the ice on for 20 minutes, 2-3 times a day. Wear a supportive bra, especially during exercise. You may also want to wear a supportive bra while sleeping if your breasts are very tender. Medicines Take over-the-counter and prescription medicines only as told by your health care provider. If the cause of your pain is infection, you may be prescribed an antibiotic medicine. If you were prescribed an antibiotic, take it as told by your health care provider. Do not stop taking the antibiotic even if you start to feel better. Eating and drinking Your health care provider may recommend that you lessen the amount of fat in your diet. You can do this by: Limiting fried foods. Cooking foods using methods such as baking, boiling, grilling, and broiling. Decrease the amount of caffeine in your diet. Instead, drink more water and choose caffeine-free drinks. General instructions  Keep a log of the days and times when your breasts are most tender. Ask your health care provider how to do breast exams at home. This will help you notice if you have an unusual growth or lump. Keep all follow-up visits as told by your health care provider. This is important. Contact a health care  provider if: Any part of your breast is hard, red, and hot to the touch. This may be a sign of infection. You are a woman and: Not breastfeeding and you have fluid, especially blood or pus, coming out of your nipples. Have a new or painful lump in your breast that remains after your menstrual period ends. You have a fever. Your pain does not improve or it gets worse. Your pain is interfering with your daily activities. Summary Breast tenderness may range from mild discomfort to severe pain. Breast tenderness has many possible causes, including hormone changes, infections, and taking certain medicines. It can be treated with ice, wearing a supportive bra, and medicines. Make changes to your diet if told to by your health care provider. This information is not intended to replace advice given to you by your health care provider. Make sure you discuss any questions you have with your health care provider. Document Revised: 05/15/2018 Document Reviewed: 05/15/2018 Elsevier Patient Education  2022 [employer]  

## 2020-07-30 DIAGNOSIS — M25562 Pain in left knee: Secondary | ICD-10-CM | POA: Diagnosis not present

## 2020-07-30 DIAGNOSIS — M1712 Unilateral primary osteoarthritis, left knee: Secondary | ICD-10-CM | POA: Diagnosis not present

## 2020-08-04 ENCOUNTER — Other Ambulatory Visit: Payer: Self-pay | Admitting: Pharmacist

## 2020-08-04 MED ORDER — POTASSIUM CHLORIDE CRYS ER 10 MEQ PO TBCR: 10.0000 meq | EXTENDED_RELEASE_TABLET | ORAL | 1 refills | Status: DC | PRN

## 2020-08-25 DIAGNOSIS — L821 Other seborrheic keratosis: Secondary | ICD-10-CM | POA: Diagnosis not present

## 2020-08-25 DIAGNOSIS — L309 Dermatitis, unspecified: Secondary | ICD-10-CM | POA: Diagnosis not present

## 2020-08-26 ENCOUNTER — Other Ambulatory Visit: Payer: Self-pay | Admitting: *Deleted

## 2020-08-26 ENCOUNTER — Telehealth: Payer: Self-pay | Admitting: *Deleted

## 2020-08-26 DIAGNOSIS — Z853 Personal history of malignant neoplasm of breast: Secondary | ICD-10-CM

## 2020-08-26 DIAGNOSIS — N6121 Granulomatous mastitis, right breast: Secondary | ICD-10-CM

## 2020-08-26 DIAGNOSIS — N6459 Other signs and symptoms in breast: Secondary | ICD-10-CM

## 2020-08-26 DIAGNOSIS — R234 Changes in skin texture: Secondary | ICD-10-CM

## 2020-08-26 NOTE — Telephone Encounter (Signed)
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  Per MD order obtained for diagnostic mammogram with ultrasound and biopsy.  This RN spoke to the patient's daughter per above.

## 2020-08-28 ENCOUNTER — Other Ambulatory Visit: Payer: Self-pay | Admitting: Oncology

## 2020-08-28 DIAGNOSIS — N6459 Other signs and symptoms in breast: Secondary | ICD-10-CM

## 2020-08-28 DIAGNOSIS — Z853 Personal history of malignant neoplasm of breast: Secondary | ICD-10-CM

## 2020-08-28 DIAGNOSIS — R234 Changes in skin texture: Secondary | ICD-10-CM

## 2020-09-12 ENCOUNTER — Ambulatory Visit
Admission: RE | Admit: 2020-09-12 | Discharge: 2020-09-12 | Disposition: A | Discharge status: Home / Self Care | Payer: Medicare Other | Source: Ambulatory Visit | Attending: Oncology | Admitting: Oncology

## 2020-09-12 ENCOUNTER — Other Ambulatory Visit: Payer: Self-pay

## 2020-09-12 DIAGNOSIS — N6459 Other signs and symptoms in breast: Secondary | ICD-10-CM

## 2020-09-12 DIAGNOSIS — R922 Inconclusive mammogram: Secondary | ICD-10-CM | POA: Diagnosis not present

## 2020-09-12 DIAGNOSIS — Z853 Personal history of malignant neoplasm of breast: Secondary | ICD-10-CM

## 2020-09-12 DIAGNOSIS — R234 Changes in skin texture: Secondary | ICD-10-CM

## 2020-09-15 ENCOUNTER — Other Ambulatory Visit: Payer: Self-pay | Admitting: Oncology

## 2020-09-15 ENCOUNTER — Encounter: Payer: Self-pay | Admitting: Oncology

## 2020-09-19 DIAGNOSIS — Z952 Presence of prosthetic heart valve: Secondary | ICD-10-CM | POA: Diagnosis not present

## 2020-09-19 DIAGNOSIS — Z23 Encounter for immunization: Secondary | ICD-10-CM | POA: Diagnosis not present

## 2020-09-19 DIAGNOSIS — N183 Chronic kidney disease, stage 3 unspecified: Secondary | ICD-10-CM | POA: Diagnosis not present

## 2020-09-19 DIAGNOSIS — E7800 Pure hypercholesterolemia, unspecified: Secondary | ICD-10-CM | POA: Diagnosis not present

## 2020-09-19 DIAGNOSIS — Z1389 Encounter for screening for other disorder: Secondary | ICD-10-CM | POA: Diagnosis not present

## 2020-09-19 DIAGNOSIS — Z853 Personal history of malignant neoplasm of breast: Secondary | ICD-10-CM | POA: Diagnosis not present

## 2020-09-19 DIAGNOSIS — G720 Drug-induced myopathy: Secondary | ICD-10-CM | POA: Diagnosis not present

## 2020-09-19 DIAGNOSIS — I679 Cerebrovascular disease, unspecified: Secondary | ICD-10-CM | POA: Diagnosis not present

## 2020-09-19 DIAGNOSIS — E039 Hypothyroidism, unspecified: Secondary | ICD-10-CM | POA: Diagnosis not present

## 2020-09-19 DIAGNOSIS — E559 Vitamin D deficiency, unspecified: Secondary | ICD-10-CM | POA: Diagnosis not present

## 2020-09-19 DIAGNOSIS — M810 Age-related osteoporosis without current pathological fracture: Secondary | ICD-10-CM | POA: Diagnosis not present

## 2020-09-19 DIAGNOSIS — Z0000 Encounter for general adult medical examination without abnormal findings: Secondary | ICD-10-CM | POA: Diagnosis not present

## 2020-10-08 DIAGNOSIS — J069 Acute upper respiratory infection, unspecified: Secondary | ICD-10-CM | POA: Diagnosis not present

## 2020-10-17 DIAGNOSIS — H52203 Unspecified astigmatism, bilateral: Secondary | ICD-10-CM | POA: Diagnosis not present

## 2020-10-17 DIAGNOSIS — Z961 Presence of intraocular lens: Secondary | ICD-10-CM | POA: Diagnosis not present

## 2020-10-17 DIAGNOSIS — D3132 Benign neoplasm of left choroid: Secondary | ICD-10-CM | POA: Diagnosis not present

## 2020-10-31 DIAGNOSIS — M1712 Unilateral primary osteoarthritis, left knee: Secondary | ICD-10-CM | POA: Diagnosis not present

## 2020-10-31 DIAGNOSIS — M25562 Pain in left knee: Secondary | ICD-10-CM | POA: Diagnosis not present

## 2020-11-10 DIAGNOSIS — Z23 Encounter for immunization: Secondary | ICD-10-CM | POA: Diagnosis not present

## 2020-11-11 ENCOUNTER — Other Ambulatory Visit: Payer: Self-pay

## 2020-11-11 ENCOUNTER — Ambulatory Visit (HOSPITAL_COMMUNITY)
Admission: RE | Admit: 2020-11-11 | Discharge: 2020-11-11 | Disposition: A | Discharge status: Home / Self Care | Payer: Medicare Other | Source: Ambulatory Visit | Attending: Cardiology | Admitting: Cardiology

## 2020-11-11 DIAGNOSIS — I779 Disorder of arteries and arterioles, unspecified: Secondary | ICD-10-CM | POA: Insufficient documentation

## 2020-11-11 DIAGNOSIS — I5032 Chronic diastolic (congestive) heart failure: Secondary | ICD-10-CM | POA: Insufficient documentation

## 2020-11-11 DIAGNOSIS — I7779 Dissection of other artery: Secondary | ICD-10-CM

## 2020-11-11 DIAGNOSIS — Z952 Presence of prosthetic heart valve: Secondary | ICD-10-CM | POA: Diagnosis not present

## 2020-11-11 DIAGNOSIS — I2510 Atherosclerotic heart disease of native coronary artery without angina pectoris: Secondary | ICD-10-CM | POA: Insufficient documentation

## 2020-11-11 DIAGNOSIS — I359 Nonrheumatic aortic valve disorder, unspecified: Secondary | ICD-10-CM | POA: Diagnosis not present

## 2020-11-11 DIAGNOSIS — I6523 Occlusion and stenosis of bilateral carotid arteries: Secondary | ICD-10-CM

## 2020-11-19 ENCOUNTER — Other Ambulatory Visit: Payer: Self-pay | Admitting: Family Medicine

## 2020-11-19 DIAGNOSIS — Z1231 Encounter for screening mammogram for malignant neoplasm of breast: Secondary | ICD-10-CM

## 2020-12-25 ENCOUNTER — Ambulatory Visit: Payer: Medicare Other

## 2021-01-01 ENCOUNTER — Telehealth: Payer: Self-pay | Admitting: Cardiovascular Disease

## 2021-01-01 DIAGNOSIS — I5032 Chronic diastolic (congestive) heart failure: Secondary | ICD-10-CM

## 2021-01-01 NOTE — Telephone Encounter (Signed)
Patient called back gave advisement. Verbalized understanding and agreement.

## 2021-01-01 NOTE — Telephone Encounter (Signed)
Left message to call back  [name], MD  I would ask her to take Lasix 40 mg daily for one week, follow daily weights then let us know how she is feeling. Her renal function has been normal in the past. Gerald Stabs

## 2021-01-01 NOTE — Telephone Encounter (Signed)
Pt c/o Shortness Of Breath: STAT if SOB developed within the last 24 hours or pt is noticeably SOB on the phone  1. Are you currently SOB (can you hear that pt is SOB on the phone)? Pt said she is  (although it is not noticeable over the phone)  2. How long have you been experiencing SOB? Since last weekend (before Christmas)  3. Are you SOB when sitting or when up moving around? All the time   4. Are you currently experiencing any other symptoms? No  Patient states she started taking her lasix when she realized she was SOB, and she has taken it every day. She was not sure how long she needs to take the lasix.  She is not scheduled to see Dr. Angelena Form until March but would like to have her symptoms evaluated before that

## 2021-01-01 NOTE — Telephone Encounter (Signed)
Spoke with pt who reports some increased SOB on exertion and talking on phone.  Pt does not sound SOB on phone while talking with RN.  Pt states her daughter brought this to her attention while talking with her on the phone several days ago.  She denies CP, edema or dizziness.  She denies orthopnea as well.  She began taking Lasix 20mg  daily.  She did not see any improvement so her granddaughter recommended she increase dosage to 40mg  daily.  Pt did this for 3-4 days and states she did see a decrease in her SOB on this dosing.  She is currently back on Lasix 20mg .  She reports some wheezing and a dry cough.  She did have bronchitis diagnosed by her PCP back in November. She is taking Delsym for cough. Pt advised will forward information to Dr Angelena Form for further review and recommendation.  Pt is currently scheduled to see Dr Angelena Form 03/06/2021 for follow up.

## 2021-01-08 MED ORDER — FUROSEMIDE 20 MG PO TABS: 40.0000 mg | ORAL_TABLET | Freq: Every day | ORAL | 3 refills | Status: DC

## 2021-01-08 NOTE — Telephone Encounter (Signed)
Pt reports she is feeling much better on daily lasix 40 mg.  She has more energy and is breathing better. She is going to stay on the lasix 40 mg tablets and come tomorrow to check BMET since she is staying on the current dose.  In the past she has taken the lasix 20 mg daily for several days but it did not help her breathing or weights.    Pt aware I am forwarding to Dr. Angelena Form to make him aware and he has other recommendations we will call her back.

## 2021-01-09 ENCOUNTER — Other Ambulatory Visit: Payer: Self-pay

## 2021-01-09 ENCOUNTER — Other Ambulatory Visit: Payer: Medicare Other | Admitting: *Deleted

## 2021-01-09 DIAGNOSIS — I5032 Chronic diastolic (congestive) heart failure: Secondary | ICD-10-CM | POA: Diagnosis not present

## 2021-01-09 LAB — BASIC METABOLIC PANEL
BUN/Creatinine Ratio: 23 (ref 12–28)
BUN: 23 mg/dL (ref 8–27)
CO2: 28 mmol/L (ref 20–29)
Calcium: 9.7 mg/dL (ref 8.7–10.3)
Chloride: 97 mmol/L (ref 96–106)
Creatinine, Ser: 1.01 mg/dL — ABNORMAL HIGH (ref 0.57–1.00)
Glucose: 100 mg/dL — ABNORMAL HIGH (ref 70–99)
Potassium: 4.1 mmol/L (ref 3.5–5.2)
Sodium: 138 mmol/L (ref 134–144)
eGFR: 54 mL/min/{1.73_m2} — ABNORMAL LOW (ref >59)

## 2021-01-14 ENCOUNTER — Telehealth: Payer: Self-pay | Admitting: Cardiovascular Disease

## 2021-01-14 NOTE — Telephone Encounter (Signed)
Called patient and reviewed her lab results and to continue lasix at current dose, 40 mg daily.  Pt voices understanding and wishes to receive a call w results in the future instead of MyChart.

## 2021-01-14 NOTE — Telephone Encounter (Signed)
Pt c/o medication issue:  1. Name of Medication: furosemide (LASIX) 20 MG tablet  2. How are you currently taking this medication (dosage and times per day)? Take 2 tablets (40 mg total) by mouth daily.  3. Are you having a reaction (difficulty breathing--STAT)? no  4. What is your medication issue? Calling to make sure she still supposed to take 2 lasix  pills a day. Please advise

## 2021-01-20 ENCOUNTER — Other Ambulatory Visit: Payer: Self-pay

## 2021-01-20 MED ORDER — POTASSIUM CHLORIDE CRYS ER 10 MEQ PO TBCR: 10.0000 meq | EXTENDED_RELEASE_TABLET | ORAL | 2 refills | Status: DC | PRN

## 2021-01-20 NOTE — Telephone Encounter (Signed)
Pt's medication was sent to pt's pharmacy as requested. Confirmation received.  °

## 2021-02-06 ENCOUNTER — Telehealth: Payer: Self-pay | Admitting: Cardiovascular Disease

## 2021-02-06 NOTE — Telephone Encounter (Signed)
Called patient to offer sooner appt with Dr. Angelena Form due to her being on the wait list. Patient decided to keep her current appointment that is scheduled for 03/03. Requested I report that since increasing her Lasix to 2 tablets at 2:00 PM daily she has been doing well.

## 2021-02-16 DIAGNOSIS — M25562 Pain in left knee: Secondary | ICD-10-CM | POA: Diagnosis not present

## 2021-02-16 DIAGNOSIS — M1712 Unilateral primary osteoarthritis, left knee: Secondary | ICD-10-CM | POA: Diagnosis not present

## 2021-03-05 NOTE — Progress Notes (Signed)
Chief Complaint  Patient presents with   Follow-up    CAD   History of Present Illness: 88 yo female with history of CAD, HLD, SVT, breast cancer, chronic diastolic CHF, bilateral carotid artery disease and severe aortic valve stenosis s/p TAVR who is here today for cardiac follow up.  Cardiac cath in July 2017 with mild plaque in the LAD. She underwent TAVR on 07/27/16 with placement of a 26 mm Edwards Sapien 3 bioprosthetic AVR from the right femoral artery. Post operative echo showed a normally functioning AVR. She had been followed in the past for her CAD and AS by Dr. Radford Pax. She was seen in our office September 2020 by Richardson Dopp, PA-C with volume overload that responded quickly to Lasix therapy. Echo September 2020 with LVEF=55-60%, mild LVH, normally functioning bioprosthetic AVR with mean gradient 17 mmHg (13 mmHg post immediately post implant), trace MR. Carotid artery dopplers November 2022 with mild to moderate bilateral carotid artery disease.   She is here today for follow up. The patient denies any chest pain, dyspnea, palpitations, lower extremity edema, orthopnea, PND, dizziness, near syncope or syncope.     Primary Care Physician: Gaynelle Arabian, MD  Past Medical History:  Diagnosis Date   Arthritis    hands, knees   CAD (coronary artery disease), native coronary artery cardiologist--- dr Angelena Form   a. mild non obst CAD per cath 07-18-2015   Carotid artery disease ([place])    Carotid US 09/2018: Bilateral ICA 40-59 // Carotid US 11/21: Bilateral ICA 40-59; repeat 1 year    Chronic diastolic CHF (congestive heart failure) ([place])    followed by cardiology   CKD (chronic kidney disease), stage III ([place])    followed by pcp   Dyspnea    per pt when she walks which is usual for her   History of gastric ulcer    remote yrs ago   History of rheumatic fever as a child    History of right breast cancer 2006   s/p right partial mastectomy w/ node dissection's,  completed chemo/  radiation same year  (05-21-2020 pt stated no recurrence)   Hyperlipidemia    Hypothyroidism    followed by pcp   Osteoporosis    PMB (postmenopausal bleeding)    PSVT (paroxysmal supraventricular tachycardia) ([place]) 10/2012   in ED resolved w/ vagal maneuver    S/P aortic valve replacement 07/27/2016   for severe stenosis;  last echo in epic 09-22-2018 ef 55-60%, mild TR, aortic valve area 1.4cm^2 and mean gradiant 17 mmHg   Seasonal allergies    Vitamin D deficiency     Past Surgical History:  Procedure Laterality Date   CARDIAC CATHETERIZATION N/A 07/18/2015   Procedure: Right/Left Heart Cath and Coronary Angiography;  Surgeon: [name], MD;  Location: [facility];  Service: Cardiovascular;  Laterality: N/A;   CATARACT EXTRACTION W/ INTRAOCULAR LENS  IMPLANT, BILATERAL     2017   CHOLECYSTECTOMY N/A 08/06/2014   Procedure: LAPAROSCOPIC CHOLECYSTECTOMY ;  Surgeon: Rolm Bookbinder, MD;  Location: [facility];  Service: General;  Laterality: N/A;   HYSTEROSCOPY WITH D & C N/A 05/26/2020   Procedure: DILATATION AND CURETTAGE /HYSTEROSCOPY;  Surgeon: Salvadore Dom, MD;  Location: [facility];  Service: Gynecology;  Laterality: N/A;  Request case for [place]   PARTIAL MASTECTOMY WITH AXILLARY SENTINEL LYMPH NODE BIOPSY Right 2006   TEE WITHOUT CARDIOVERSION N/A 07/27/2016   Procedure: TRANSESOPHAGEAL ECHOCARDIOGRAM (TEE);  Surgeon: Lauree Chandler  D, MD;  Location: [facility];  Service: Open Heart Surgery;  Laterality: N/A;   THYROID SURGERY  1969   NODULE REMOVED, benign per pt   TONSILLECTOMY  age 53   TOTAL HIP ARTHROPLASTY Left 2003   TOTAL HIP ARTHROPLASTY Right 08/28/2013   Procedure: RIGHT TOTAL HIP ARTHROPLASTY ANTERIOR APPROACH;  Surgeon: Mauri Pole, MD;  Location: [facility];  Service: Orthopedics;  Laterality: Right;   TOTAL KNEE ARTHROPLASTY Right 01-16-2007  @[facility]    TRANSCATHETER AORTIC VALVE REPLACEMENT, TRANSFEMORAL N/A 07/27/2016   Procedure:  TRANSCATHETER AORTIC VALVE REPLACEMENT, TRANSFEMORAL;  Surgeon: [name], MD;  Location: [facility];  Service: Open Heart Surgery;  Laterality: N/A;    Current Outpatient Medications  Medication Sig Dispense Refill   aspirin EC 81 MG tablet Take 81 mg by mouth daily.     Calcium Carbonate-Vitamin D (CALCIUM 600/VITAMIN D PO) Take 1 tablet by mouth daily.     Coenzyme Q10 (COQ10) 200 MG CAPS Take 200 mg by mouth daily.     ergocalciferol (VITAMIN D2) 1.25 MG (50000 UT) capsule Take 50,000 Units by mouth once a week. Saturday's     fish oil-omega-3 fatty acids 1000 MG capsule Take 1 g by mouth daily.     furosemide (LASIX) 20 MG tablet Take 2 tablets (40 mg total) by mouth daily. 180 tablet 3   levothyroxine (SYNTHROID, LEVOTHROID) 112 MCG tablet Take 112 mcg by mouth daily before breakfast.     LYSINE PO Take 1,000 mg by mouth daily.     Magnesium 400 MG CAPS Take 400 mg by mouth daily.      Menthol, Topical Analgesic, (BIOFREEZE EX) Apply 1 application topically as needed (pain).      Multiple Vitamin (MULTIVITAMIN WITH MINERALS) TABS tablet Take 1 tablet by mouth daily.     potassium chloride (KLOR-CON M) 10 MEQ tablet Take 1 tablet (10 mEq total) by mouth as needed (with lasix). 30 tablet 2   vitamin B-12 (CYANOCOBALAMIN) 1000 MCG tablet Take 1,000 mcg by mouth daily.     vitamin C (ASCORBIC ACID) 500 MG tablet Take 500 mg by mouth daily.     Zinc 50 MG TABS Take 1 tablet by mouth daily.     No current facility-administered medications for this visit.    Allergies  Allergen Reactions   Atorvastatin Other (See Comments)    Myalgias    Compazine [Prochlorperazine] Other (See Comments)    Unknown, pt does not remember   Floxin [Ofloxacin] Other (See Comments)    Unknown, pt does not remember reaction but was allergic reaction   Livalo [Pitavastatin] Other (See Comments)    Leg pain   Pravastatin Sodium Other (See Comments)    mylgias    Simvastatin Other (See Comments)     Elevated lft's 06/2011   Fenofibrate Rash    Rash     Social History   Socioeconomic History   Marital status: Widowed    Spouse name: Not on file   Number of children: Not on file   Years of education: Not on file   Highest education level: Not on file  Occupational History   Not on file  Tobacco Use   Smoking status: Never   Smokeless tobacco: Never  Vaping Use   Vaping Use: Never used  Substance and Sexual Activity   Alcohol use: No   Drug use: Never   Sexual activity: Not Currently    Birth control/protection: Post-menopausal  Other Topics Concern  Not on file  Social History Narrative   Not on file   Social Determinants of Health   Financial Resource Strain: Not on file  Food Insecurity: Not on file  Transportation Needs: Not on file  Physical Activity: Not on file  Stress: Not on file  Social Connections: Not on file  Intimate Partner Violence: Not on file    Family History  Problem Relation Age of Onset   Heart disease Mother    Heart disease Father     Review of Systems:  As stated in the HPI and otherwise negative.   BP 128/80    Pulse 60    Ht 5\' 2"  (1.575 m)    Wt 179 lb 12.8 oz (81.6 kg)    LMP  (LMP Unknown)    SpO2 97%    BMI 32.89 kg/m   Physical Examination:  General: Well developed, well nourished, NAD  HEENT: OP clear, mucus membranes moist  SKIN: warm, dry. No rashes. Neuro: No focal deficits  Musculoskeletal: Muscle strength 5/5 all ext  Psychiatric: Mood and affect normal  Neck: No JVD, no carotid bruits, no thyromegaly, no lymphadenopathy.  Lungs:Clear bilaterally, no wheezes, rhonci, crackles Cardiovascular: Regular rate and rhythm. No murmurs, gallops or rubs. Abdomen:Soft. Bowel sounds present. Non-tender.  Extremities: No lower extremity edema. Pulses are 2 + in the bilateral DP/PT.  Echo September 2020:   1. Left ventricular ejection fraction, by visual estimation, is 55 to 60%. The left ventricle has normal function. Left  ventricular septal wall thickness was mildly increased. There is mildly increased left ventricular hypertrophy.  2. Global right ventricle has normal systolic function.The right ventricular size is normal. No increase in right ventricular wall thickness.  3. Left atrial size was mildly dilated.  4. Right atrial size was normal.  5. The mitral valve is normal in structure. Trace mitral valve regurgitation.  6. The tricuspid valve is normal in structure. Tricuspid valve regurgitation is mild.  7. Aortic valve regurgitation was not visualized by color flow Doppler.  8. 26 mm Sapien 3 TAVR valve no PVL previous mean gradient July 2019 was 13 mmHg mildly higher this exam at 17 mmHg.  9. The pulmonic valve was grossly normal. Pulmonic valve regurgitation is trivial by color flow Doppler.    EKG:  EKG is ordered today. The ekg ordered today demonstrates sinus  Recent Labs: 05/26/2020: Hemoglobin 15.0 01/09/2021: BUN 23; Creatinine, Ser 1.01; Potassium 4.1; Sodium 138   Lipid Panel No results found for: CHOL, TRIG, HDL, CHOLHDL, VLDL, LDLCALC, LDLDIRECT   Wt Readings from Last 3 Encounters:  03/06/21 179 lb 12.8 oz (81.6 kg)  06/13/20 178 lb (80.7 kg)  05/26/20 175 lb 14.4 oz (79.8 kg)     Other studies Reviewed: Additional studies/ records that were reviewed today include: . Review of the above records demonstrates:   Assessment and Plan:   1. Severe aortic valve stenosis: She underwent TAVR with placement of a 26 mm Sapien 3 valve from the right transfemoral approach in July 2018. Her valve is working well by echo in September 2020. Will continue ASA and SBE prophylaxis.   2. CAD without angina: She is known to have mild CAD by cath in 2017. She has no chest pain. Will continue ASA. She is statin intolerant.  3.SVT: Rare palpitations.    4. Chronic diastolic CHF: No volume overload on exam. Continue Lasix 40 mg per day. BMET today.   5. Carotid artery disease: Mild to moderate  bilateral  carotid artery disease by dopplers November 2022. Repeat November 2024  Current medicines are reviewed at length with the patient today.  The patient does not have concerns regarding medicines.  The following changes have been made:  no change  Labs/ tests ordered today include:   Orders Placed This Encounter  Procedures   Basic metabolic panel   EKG 25-KNLZ   Disposition:   F/U with me in 12 months  Signed, Lauree Chandler, MD 03/06/2021 8:38 AM    [facility] [place], [place], [place]  [address] Phone: [phone]; Fax: [phone]

## 2021-03-06 ENCOUNTER — Other Ambulatory Visit: Payer: Self-pay

## 2021-03-06 ENCOUNTER — Encounter: Payer: Self-pay | Admitting: Cardiovascular Disease

## 2021-03-06 ENCOUNTER — Ambulatory Visit (INDEPENDENT_AMBULATORY_CARE_PROVIDER_SITE_OTHER): Payer: Medicare Other | Admitting: Cardiovascular Disease

## 2021-03-06 VITALS — BP 128/80 | HR 60 | Ht 62.0 in | Wt 179.8 lb

## 2021-03-06 DIAGNOSIS — I359 Nonrheumatic aortic valve disorder, unspecified: Secondary | ICD-10-CM

## 2021-03-06 DIAGNOSIS — Z952 Presence of prosthetic heart valve: Secondary | ICD-10-CM | POA: Diagnosis not present

## 2021-03-06 DIAGNOSIS — I2510 Atherosclerotic heart disease of native coronary artery without angina pectoris: Secondary | ICD-10-CM | POA: Diagnosis not present

## 2021-03-06 DIAGNOSIS — I779 Disorder of arteries and arterioles, unspecified: Secondary | ICD-10-CM

## 2021-03-06 DIAGNOSIS — I5032 Chronic diastolic (congestive) heart failure: Secondary | ICD-10-CM

## 2021-03-06 LAB — BASIC METABOLIC PANEL
BUN/Creatinine Ratio: 18 (ref 12–28)
BUN: 19 mg/dL (ref 8–27)
CO2: 29 mmol/L (ref 20–29)
Calcium: 9.5 mg/dL (ref 8.7–10.3)
Chloride: 100 mmol/L (ref 96–106)
Creatinine, Ser: 1.03 mg/dL — ABNORMAL HIGH (ref 0.57–1.00)
Glucose: 93 mg/dL (ref 70–99)
Potassium: 4.2 mmol/L (ref 3.5–5.2)
Sodium: 142 mmol/L (ref 134–144)
eGFR: 53 mL/min/{1.73_m2} — ABNORMAL LOW (ref >59)

## 2021-03-06 NOTE — Patient Instructions (Signed)
Medication Instructions:  ?No changes ?*If you need a refill on your cardiac medications before your next appointment, please call your pharmacy* ? ? ?Lab Work: ?Today: BMET ?If you have labs (blood work) drawn today and your tests are completely normal, you will receive your results only by: ?MyChart Message (if you have MyChart) OR ?A paper copy in the mail ?If you have any lab test that is abnormal or we need to change your treatment, we will call you to review the results. ? ? ?Testing/Procedures: ?none ? ? ?Follow-Up: ?At [facility], you and your health needs are our priority.  As part of our continuing mission to provide you with exceptional heart care, we have created designated Provider Care Teams.  These Care Teams include your primary Cardiologist (physician) and Advanced Practice Providers (APPs -  Physician Assistants and Nurse Practitioners) who all work together to provide you with the care you need, when you need it. ? ? ?Your next appointment:   ?12 month(s) ? ?The format for your next appointment:   ?In Person ? ?Provider:   ?Lauree Chandler, MD   ? ?  ?

## 2021-04-18 ENCOUNTER — Other Ambulatory Visit: Payer: Self-pay | Admitting: Cardiovascular Disease

## 2021-05-25 DIAGNOSIS — M1712 Unilateral primary osteoarthritis, left knee: Secondary | ICD-10-CM | POA: Diagnosis not present

## 2021-07-13 ENCOUNTER — Telehealth: Payer: Self-pay | Admitting: Cardiovascular Disease

## 2021-07-13 NOTE — Telephone Encounter (Signed)
Spoke with the patient who reports that she has been having increased urinary frequency, burning, and pelvic pressure. She would like to know if she could get an antibiotic for possible UTI. I have advised the patient that she will need to reach out to her PCP in regards to this. Patient verbalized understanding.

## 2021-07-13 NOTE — Telephone Encounter (Signed)
Patient is calling requesting to speak with a nurse stating she believes she has a UTI and would like medication prescribed that won't interact with there cardiac medications. She is requesting a callback regarding this today if possible.

## 2021-07-14 DIAGNOSIS — R350 Frequency of micturition: Secondary | ICD-10-CM | POA: Diagnosis not present

## 2021-07-15 ENCOUNTER — Telehealth: Payer: Self-pay

## 2021-07-15 NOTE — Telephone Encounter (Signed)
Received message from front desk: "Patient called stating she had a UTI and was seen by her PCP. Stated PCP did not prescribe anything and wants to know if we can fill it for her."  I messaged Alora back " Dr. Talbert Nan does not treat UTI's over the phone. Patient will need a visit scheduled for UTI. (She has not been seen in the office in over a year.) "  Alora replied "I called her she will wait on her PCP. IF she has any continuing problem she will schedule with [place]."

## 2021-07-16 NOTE — Telephone Encounter (Signed)
Patient daughter Eustaquio Maize called stating patient urine culture was negative. Patient still having pelvic pressure, burning patient scheduled to see Jami tomorrow at 8am.

## 2021-07-17 ENCOUNTER — Ambulatory Visit: Payer: Medicare Other | Admitting: Radiology

## 2021-07-22 ENCOUNTER — Ambulatory Visit (INDEPENDENT_AMBULATORY_CARE_PROVIDER_SITE_OTHER): Payer: Medicare Other | Admitting: Obstetrics and Gynecology

## 2021-07-22 VITALS — BP 140/70 | HR 78 | Wt 178.0 lb

## 2021-07-22 DIAGNOSIS — G720 Drug-induced myopathy: Secondary | ICD-10-CM | POA: Insufficient documentation

## 2021-07-22 DIAGNOSIS — R109 Unspecified abdominal pain: Secondary | ICD-10-CM | POA: Diagnosis not present

## 2021-07-22 DIAGNOSIS — M8580 Other specified disorders of bone density and structure, unspecified site: Secondary | ICD-10-CM | POA: Insufficient documentation

## 2021-07-22 DIAGNOSIS — Z853 Personal history of malignant neoplasm of breast: Secondary | ICD-10-CM | POA: Insufficient documentation

## 2021-07-22 DIAGNOSIS — I2510 Atherosclerotic heart disease of native coronary artery without angina pectoris: Secondary | ICD-10-CM | POA: Diagnosis not present

## 2021-07-22 DIAGNOSIS — R102 Pelvic and perineal pain: Secondary | ICD-10-CM | POA: Diagnosis not present

## 2021-07-22 DIAGNOSIS — Z8711 Personal history of peptic ulcer disease: Secondary | ICD-10-CM | POA: Insufficient documentation

## 2021-07-22 DIAGNOSIS — E559 Vitamin D deficiency, unspecified: Secondary | ICD-10-CM | POA: Insufficient documentation

## 2021-07-22 DIAGNOSIS — N183 Chronic kidney disease, stage 3 unspecified: Secondary | ICD-10-CM | POA: Insufficient documentation

## 2021-07-22 DIAGNOSIS — I062 Rheumatic aortic stenosis with insufficiency: Secondary | ICD-10-CM | POA: Insufficient documentation

## 2021-07-22 DIAGNOSIS — M810 Age-related osteoporosis without current pathological fracture: Secondary | ICD-10-CM | POA: Insufficient documentation

## 2021-07-22 DIAGNOSIS — Z952 Presence of prosthetic heart valve: Secondary | ICD-10-CM | POA: Insufficient documentation

## 2021-07-22 LAB — URINALYSIS, COMPLETE
Bacteria, UA: NONE SEEN /HPF
Bilirubin Urine: NEGATIVE
Casts: NONE SEEN /LPF
Crystals: NONE SEEN /HPF
Glucose, UA: NEGATIVE
Hgb urine dipstick: NEGATIVE
Hyaline Cast: NONE SEEN /LPF
Ketones, ur: NEGATIVE
Leukocytes,Ua: NEGATIVE
Nitrite: NEGATIVE
Protein, ur: NEGATIVE
RBC / HPF: NONE SEEN /HPF (ref 0–2)
Specific Gravity, Urine: 1.01 (ref 1.001–1.035)
WBC, UA: NONE SEEN /HPF (ref 0–5)
Yeast: NONE SEEN /HPF
pH: 5 (ref 5.0–8.0)

## 2021-07-22 NOTE — Progress Notes (Signed)
GYNECOLOGY  VISIT   HPI: 88 y.o.   Widowed White or Caucasian Not Hispanic or Latino  female   [id number] with No LMP recorded (lmp unknown). Patient is postmenopausal.   here for constant pelvic pressure that has been present since the 8th of July. She took Macrobid 100 x5 days starting on 7/13. She states that she had a negative culture on 7/8. No dysuria, no frequency or urgency to void. No leaking urine, voiding well. Feels she empties.  She c/o a constant pressure in her lower abdomen, uncomfortable.  She is having a normal BM qd. No vaginal bleeding.  Not sexually active.  No fevers.   GYNECOLOGIC HISTORY: No LMP recorded (lmp unknown). Patient is postmenopausal. Contraception:pmp  Menopausal hormone therapy: none         OB History     Gravida  0   Para  0   Term  0   Preterm  0   AB  0   Living  0      SAB  0   IAB  0   Ectopic  0   Multiple  0   Live Births  0              Patient Active Problem List   Diagnosis Date Noted   History of total knee replacement, right 11/04/2019   Pain in joint of left knee 11/04/2019   Osteoarthritis of left knee 11/04/2019   Carotid artery disease ([place])    ETD (Eustachian tube dysfunction), bilateral 05/10/2017   Seasonal allergic rhinitis 05/10/2017   Aortic stenosis, severe    Rheumatic fever    CAD (coronary artery disease), native coronary artery 10/20/2015   Weakness 05/08/2015   Breast cancer, right breast ([place])    PUD (peptic ulcer disease)    Hypothyroidism    Hypercholesteremia    Obese 08/29/2013   S/P right THA, AA 08/28/2013   Chronic diastolic heart failure ([place]) 02/13/2013   SVT (supraventricular tachycardia) ([place]) 10/24/2012    Past Medical History:  Diagnosis Date   Arthritis    hands, knees   CAD (coronary artery disease), native coronary artery cardiologist--- dr Angelena Form   a. mild non obst CAD per cath 07-18-2015   Carotid artery disease ([place])    Carotid US 09/2018: Bilateral ICA  40-59 // Carotid US 11/21: Bilateral ICA 40-59; repeat 1 year    Chronic diastolic CHF (congestive heart failure) ([place])    followed by cardiology   CKD (chronic kidney disease), stage III ([place])    followed by pcp   Dyspnea    per pt when she walks which is usual for her   History of gastric ulcer    remote yrs ago   History of rheumatic fever as a child    History of right breast cancer 2006   s/p right partial mastectomy w/ node dissection's,  completed chemo/ radiation same year  (05-21-2020 pt stated no recurrence)   Hyperlipidemia    Hypothyroidism    followed by pcp   Osteoporosis    PMB (postmenopausal bleeding)    PSVT (paroxysmal supraventricular tachycardia) ([place]) 10/2012   in ED resolved w/ vagal maneuver    S/P aortic valve replacement 07/27/2016   for severe stenosis;  last echo in epic 09-22-2018 ef 55-60%, mild TR, aortic valve area 1.4cm^2 and mean gradiant 17 mmHg   Seasonal allergies    Vitamin D deficiency     Past Surgical History:  Procedure Laterality Date  CARDIAC CATHETERIZATION N/A 07/18/2015   Procedure: Right/Left Heart Cath and Coronary Angiography;  Surgeon: [name], MD;  Location: [facility];  Service: Cardiovascular;  Laterality: N/A;   CATARACT EXTRACTION W/ INTRAOCULAR LENS  IMPLANT, BILATERAL     2017   CHOLECYSTECTOMY N/A 08/06/2014   Procedure: LAPAROSCOPIC CHOLECYSTECTOMY ;  Surgeon: Rolm Bookbinder, MD;  Location: [facility];  Service: General;  Laterality: N/A;   HYSTEROSCOPY WITH D & C N/A 05/26/2020   Procedure: DILATATION AND CURETTAGE /HYSTEROSCOPY;  Surgeon: Salvadore Dom, MD;  Location: [facility];  Service: Gynecology;  Laterality: N/A;  Request case for Cate   PARTIAL MASTECTOMY WITH AXILLARY SENTINEL LYMPH NODE BIOPSY Right 2006   TEE WITHOUT CARDIOVERSION N/A 07/27/2016   Procedure: TRANSESOPHAGEAL ECHOCARDIOGRAM (TEE);  Surgeon: [name], MD;  Location: [facility];  Service: Open  Heart Surgery;  Laterality: N/A;   THYROID SURGERY  1969   NODULE REMOVED, benign per pt   TONSILLECTOMY  age 68   TOTAL HIP ARTHROPLASTY Left 2003   TOTAL HIP ARTHROPLASTY Right 08/28/2013   Procedure: RIGHT TOTAL HIP ARTHROPLASTY ANTERIOR APPROACH;  Surgeon: Mauri Pole, MD;  Location: [facility];  Service: Orthopedics;  Laterality: Right;   TOTAL KNEE ARTHROPLASTY Right 01-16-2007  '@[facility]'$    TRANSCATHETER AORTIC VALVE REPLACEMENT, TRANSFEMORAL N/A 07/27/2016   Procedure: TRANSCATHETER AORTIC VALVE REPLACEMENT, TRANSFEMORAL;  Surgeon: [name], MD;  Location: [facility];  Service: Open Heart Surgery;  Laterality: N/A;    Current Outpatient Medications  Medication Sig Dispense Refill   aspirin EC 81 MG tablet Take 81 mg by mouth daily.     Calcium Carbonate-Vitamin D (CALCIUM 600/VITAMIN D PO) Take 1 tablet by mouth daily.     fish oil-omega-3 fatty acids 1000 MG capsule Take 1 g by mouth daily.     furosemide (LASIX) 20 MG tablet Take 2 tablets (40 mg total) by mouth daily. 180 tablet 3   levothyroxine (SYNTHROID, LEVOTHROID) 112 MCG tablet Take 112 mcg by mouth daily before breakfast.     LYSINE PO Take 1,000 mg by mouth daily.     Magnesium 400 MG CAPS Take 400 mg by mouth daily.      Menthol, Topical Analgesic, (BIOFREEZE EX) Apply 1 application topically as needed (pain).      Multiple Vitamin (MULTIVITAMIN WITH MINERALS) TABS tablet Take 1 tablet by mouth daily.     potassium chloride (KLOR-CON M) 10 MEQ tablet TAKE 1 TABLET BY MOUTH AS NEEDED( WITH LASIX) 30 tablet 2   vitamin B-12 (CYANOCOBALAMIN) 1000 MCG tablet Take 1,000 mcg by mouth daily.     vitamin C (ASCORBIC ACID) 500 MG tablet Take 500 mg by mouth daily.     Zinc 50 MG TABS Take 1 tablet by mouth daily.     ergocalciferol (VITAMIN D2) 1.25 MG (50000 UT) capsule Take 50,000 Units by mouth once a week. Saturday's     No current facility-administered medications for this visit.     ALLERGIES: Atorvastatin, Compazine  [prochlorperazine], Floxin [ofloxacin], Livalo [pitavastatin], Pravastatin sodium, Simvastatin, and Fenofibrate  Family History  Problem Relation Age of Onset   Heart disease Mother    Heart disease Father     Social History   Socioeconomic History   Marital status: Widowed    Spouse name: Not on file   Number of children: Not on file   Years of education: Not on file   Highest education level: Not on file  Occupational  History   Not on file  Tobacco Use   Smoking status: Never   Smokeless tobacco: Never  Vaping Use   Vaping Use: Never used  Substance and Sexual Activity   Alcohol use: No   Drug use: Never   Sexual activity: Not Currently    Birth control/protection: Post-menopausal  Other Topics Concern   Not on file  Social History Narrative   Not on file   Social Determinants of Health   Financial Resource Strain: Not on file  Food Insecurity: Not on file  Transportation Needs: Not on file  Physical Activity: Not on file  Stress: Not on file  Social Connections: Not on file  Intimate Partner Violence: Not on file    Review of Systems  Genitourinary:        Pelvic pressure   All other systems reviewed and are negative.   PHYSICAL EXAMINATION:    BP 140/70   Pulse 78   Wt 178 lb (80.7 kg)   LMP  (LMP Unknown)   SpO2 99%   BMI 32.56 kg/m     General appearance: alert, cooperative and appears stated age Abdomen: soft, mildly tender BLQ (low); non distended, no masses,  no organomegaly  Pelvic: External genitalia:  no lesions              Urethra:  normal appearing urethra with no masses, tenderness or lesions              Bartholins and Skenes: normal                 Vagina: normal appearing vagina with normal color and discharge, no lesions              Cervix: no cervical motion tenderness and no lesions              Bimanual Exam:  Uterus:  normal size, contour, position, consistency, mobility, non-tender              Adnexa:  no massed,  bilaterally tender              Rectovaginal: Yes.  .  Confirms.              Anus:  normal sphincter tone, no lesions  Chaperone was present for exam.  1. Combined abdominal and pelvic pain I doubt a gyn etiology of her pain, but will get an ultrasound to confirm. Discussed f/u with her primary for possible CT.  - US PELVIS TRANSVAGINAL NON-OB (TV ONLY); Future - Urinalysis, Complete: negative  CC: Janice Arabian, MD

## 2021-07-23 ENCOUNTER — Ambulatory Visit (INDEPENDENT_AMBULATORY_CARE_PROVIDER_SITE_OTHER): Payer: Medicare Other

## 2021-07-23 ENCOUNTER — Telehealth: Payer: Self-pay

## 2021-07-23 DIAGNOSIS — R102 Pelvic and perineal pain: Secondary | ICD-10-CM | POA: Diagnosis not present

## 2021-07-23 DIAGNOSIS — R109 Unspecified abdominal pain: Secondary | ICD-10-CM | POA: Diagnosis not present

## 2021-07-23 NOTE — Telephone Encounter (Signed)
Called to inquire if patient needs full bladder for her u/s scheduled today.  I called back and let her know that full bladder not needed.

## 2021-07-27 DIAGNOSIS — Z1389 Encounter for screening for other disorder: Secondary | ICD-10-CM | POA: Diagnosis not present

## 2021-07-27 DIAGNOSIS — Z01812 Encounter for preprocedural laboratory examination: Secondary | ICD-10-CM | POA: Diagnosis not present

## 2021-07-28 ENCOUNTER — Other Ambulatory Visit: Payer: Self-pay | Admitting: Family Medicine

## 2021-07-28 DIAGNOSIS — R102 Pelvic and perineal pain: Secondary | ICD-10-CM

## 2021-07-28 DIAGNOSIS — R109 Unspecified abdominal pain: Secondary | ICD-10-CM

## 2021-08-03 DIAGNOSIS — J069 Acute upper respiratory infection, unspecified: Secondary | ICD-10-CM | POA: Diagnosis not present

## 2021-08-03 DIAGNOSIS — R051 Acute cough: Secondary | ICD-10-CM | POA: Diagnosis not present

## 2021-08-03 DIAGNOSIS — Z03818 Encounter for observation for suspected exposure to other biological agents ruled out: Secondary | ICD-10-CM | POA: Diagnosis not present

## 2021-08-03 DIAGNOSIS — J45909 Unspecified asthma, uncomplicated: Secondary | ICD-10-CM | POA: Diagnosis not present

## 2021-08-03 DIAGNOSIS — J309 Allergic rhinitis, unspecified: Secondary | ICD-10-CM | POA: Diagnosis not present

## 2021-08-03 DIAGNOSIS — R0602 Shortness of breath: Secondary | ICD-10-CM | POA: Diagnosis not present

## 2021-08-06 ENCOUNTER — Ambulatory Visit
Admission: RE | Admit: 2021-08-06 | Discharge: 2021-08-06 | Disposition: A | Discharge status: Home / Self Care | Payer: Medicare Other | Source: Ambulatory Visit | Attending: Family Medicine | Admitting: Family Medicine

## 2021-08-06 ENCOUNTER — Other Ambulatory Visit: Payer: Self-pay | Admitting: Family Medicine

## 2021-08-06 DIAGNOSIS — R062 Wheezing: Secondary | ICD-10-CM

## 2021-08-06 DIAGNOSIS — R0602 Shortness of breath: Secondary | ICD-10-CM | POA: Diagnosis not present

## 2021-08-12 ENCOUNTER — Other Ambulatory Visit: Payer: Self-pay

## 2021-08-12 MED ORDER — POTASSIUM CHLORIDE CRYS ER 10 MEQ PO TBCR: EXTENDED_RELEASE_TABLET | ORAL | 7 refills | Status: DC

## 2021-08-20 ENCOUNTER — Ambulatory Visit
Admission: RE | Admit: 2021-08-20 | Discharge: 2021-08-20 | Disposition: A | Discharge status: Home / Self Care | Payer: Medicare Other | Source: Ambulatory Visit | Attending: Family Medicine | Admitting: Family Medicine

## 2021-08-20 DIAGNOSIS — R109 Unspecified abdominal pain: Secondary | ICD-10-CM

## 2021-08-20 DIAGNOSIS — J984 Other disorders of lung: Secondary | ICD-10-CM | POA: Diagnosis not present

## 2021-08-20 DIAGNOSIS — R102 Pelvic and perineal pain: Secondary | ICD-10-CM

## 2021-08-20 DIAGNOSIS — D259 Leiomyoma of uterus, unspecified: Secondary | ICD-10-CM | POA: Diagnosis not present

## 2021-08-20 DIAGNOSIS — I700 Atherosclerosis of aorta: Secondary | ICD-10-CM | POA: Diagnosis not present

## 2021-08-20 DIAGNOSIS — M47816 Spondylosis without myelopathy or radiculopathy, lumbar region: Secondary | ICD-10-CM | POA: Diagnosis not present

## 2021-08-20 MED ORDER — IOPAMIDOL (ISOVUE-300) INJECTION 61%
80.0000 mL | Freq: Once | INTRAVENOUS | Status: AC | PRN
  Administered 2021-08-20: 80 mL via INTRAVENOUS

## 2021-08-26 DIAGNOSIS — M1712 Unilateral primary osteoarthritis, left knee: Secondary | ICD-10-CM | POA: Diagnosis not present

## 2021-09-25 DIAGNOSIS — Z0000 Encounter for general adult medical examination without abnormal findings: Secondary | ICD-10-CM | POA: Diagnosis not present

## 2021-09-25 DIAGNOSIS — N183 Chronic kidney disease, stage 3 unspecified: Secondary | ICD-10-CM | POA: Diagnosis not present

## 2021-09-25 DIAGNOSIS — E7800 Pure hypercholesterolemia, unspecified: Secondary | ICD-10-CM | POA: Diagnosis not present

## 2021-09-25 DIAGNOSIS — E039 Hypothyroidism, unspecified: Secondary | ICD-10-CM | POA: Diagnosis not present

## 2021-09-25 DIAGNOSIS — E559 Vitamin D deficiency, unspecified: Secondary | ICD-10-CM | POA: Diagnosis not present

## 2021-09-25 DIAGNOSIS — G720 Drug-induced myopathy: Secondary | ICD-10-CM | POA: Diagnosis not present

## 2021-09-25 DIAGNOSIS — I679 Cerebrovascular disease, unspecified: Secondary | ICD-10-CM | POA: Diagnosis not present

## 2021-09-25 DIAGNOSIS — Z952 Presence of prosthetic heart valve: Secondary | ICD-10-CM | POA: Diagnosis not present

## 2021-09-25 DIAGNOSIS — Z853 Personal history of malignant neoplasm of breast: Secondary | ICD-10-CM | POA: Diagnosis not present

## 2021-09-25 DIAGNOSIS — M810 Age-related osteoporosis without current pathological fracture: Secondary | ICD-10-CM | POA: Diagnosis not present

## 2021-10-23 DIAGNOSIS — Z23 Encounter for immunization: Secondary | ICD-10-CM | POA: Diagnosis not present

## 2021-10-28 ENCOUNTER — Telehealth: Payer: Self-pay | Admitting: Cardiovascular Disease

## 2021-10-28 NOTE — Telephone Encounter (Signed)
Pt c/o Shortness Of Breath: STAT if SOB developed within the last 24 hours or pt is noticeably SOB on the phone  1. Are you currently SOB (can you hear that pt is SOB on the phone)? Yes  2. How long have you been experiencing SOB? Last few days   3. Are you SOB when sitting or when up moving around? Both  4. Are you currently experiencing any other symptoms? No.

## 2021-10-28 NOTE — Telephone Encounter (Signed)
TAVR 2018. Had episode of SOB walking to her car on Saturday.  She went home and took lasix and rested and felt better. Her weight had been up about 2-3 pounds.  She switched her lasix back to 7:30 am and has been resting to let it work and then get on with her day and she thinks it has made it more effective.   Her weight has been stable the last couple days.  Last echo 09/2018.  She wishes to be seen by [facility] since he did her valve to make sure all is okay.  Scheduled for this Friday.

## 2021-10-29 ENCOUNTER — Ambulatory Visit: Payer: Medicare Other | Admitting: Nurse Practitioner

## 2021-10-29 NOTE — Progress Notes (Signed)
Chief Complaint  Patient presents with   Follow-up    Dyspnea   History of Present Illness: 88 yo female with history of CAD, HLD, SVT, breast cancer, chronic diastolic CHF, bilateral carotid artery disease and severe aortic valve stenosis s/p TAVR who is here today for cardiac follow up.  Cardiac cath in July 2017 with mild plaque in the LAD. She underwent TAVR on 07/27/16 with placement of a 26 mm Edwards Sapien 3 bioprosthetic AVR from the right femoral artery. Post operative echo showed a normally functioning AVR. She had been followed in the past for her CAD and AS by Dr. Radford Pax. She was seen in our office September 2020 by Richardson Dopp, PA-C with volume overload that responded quickly to Lasix therapy. Echo September 2020 with LVEF=55-60%, mild LVH, normally functioning bioprosthetic AVR with mean gradient 17 mmHg (13 mmHg post immediately post implant), trace MR. Carotid artery dopplers November 2022 with mild to moderate bilateral carotid artery disease.   She is here today for follow up. The patient denies any chest pain, lower extremity edema, orthopnea, PND, dizziness, near syncope or syncope. She has woken up the last two nights with her heart fluttering. She has had some dyspnea over the past week. She was up 2-3 lbs last weekend and took an extra Lasix.     Primary Care Physician: Gaynelle Arabian, MD  Past Medical History:  Diagnosis Date   Arthritis    hands, knees   CAD (coronary artery disease), native coronary artery cardiologist--- dr Angelena Form   a. mild non obst CAD per cath 07-18-2015   Carotid artery disease ([place])    Carotid US 09/2018: Bilateral ICA 40-59 // Carotid US 11/21: Bilateral ICA 40-59; repeat 1 year    Chronic diastolic CHF (congestive heart failure) ([place])    followed by cardiology   CKD (chronic kidney disease), stage III ([place])    followed by pcp   Dyspnea    per pt when she walks which is usual for her   History of gastric ulcer    remote yrs ago    History of rheumatic fever as a child    History of right breast cancer 2006   s/p right partial mastectomy w/ node dissection's,  completed chemo/ radiation same year  (05-21-2020 pt stated no recurrence)   Hyperlipidemia    Hypothyroidism    followed by pcp   Osteoporosis    PMB (postmenopausal bleeding)    PSVT (paroxysmal supraventricular tachycardia) 10/2012   in ED resolved w/ vagal maneuver    S/P aortic valve replacement 07/27/2016   for severe stenosis;  last echo in epic 09-22-2018 ef 55-60%, mild TR, aortic valve area 1.4cm^2 and mean gradiant 17 mmHg   Seasonal allergies    Vitamin D deficiency     Past Surgical History:  Procedure Laterality Date   CARDIAC CATHETERIZATION N/A 07/18/2015   Procedure: Right/Left Heart Cath and Coronary Angiography;  Surgeon: [name], MD;  Location: [facility];  Service: Cardiovascular;  Laterality: N/A;   CATARACT EXTRACTION W/ INTRAOCULAR LENS  IMPLANT, BILATERAL     2017   CHOLECYSTECTOMY N/A 08/06/2014   Procedure: LAPAROSCOPIC CHOLECYSTECTOMY ;  Surgeon: Rolm Bookbinder, MD;  Location: [facility];  Service: General;  Laterality: N/A;   HYSTEROSCOPY WITH D & C N/A 05/26/2020   Procedure: DILATATION AND CURETTAGE /HYSTEROSCOPY;  Surgeon: Salvadore Dom, MD;  Location: [facility];  Service: Gynecology;  Laterality: N/A;  Request case for  [place]   PARTIAL MASTECTOMY WITH AXILLARY SENTINEL LYMPH NODE BIOPSY Right 2006   TEE WITHOUT CARDIOVERSION N/A 07/27/2016   Procedure: TRANSESOPHAGEAL ECHOCARDIOGRAM (TEE);  Surgeon: [name], MD;  Location: [facility];  Service: Open Heart Surgery;  Laterality: N/A;   THYROID SURGERY  1969   NODULE REMOVED, benign per pt   TONSILLECTOMY  age 34   TOTAL HIP ARTHROPLASTY Left 2003   TOTAL HIP ARTHROPLASTY Right 08/28/2013   Procedure: RIGHT TOTAL HIP ARTHROPLASTY ANTERIOR APPROACH;  Surgeon: Mauri Pole, MD;  Location: [facility];  Service: Orthopedics;   Laterality: Right;   TOTAL KNEE ARTHROPLASTY Right 01-16-2007  '@[facility]'$    TRANSCATHETER AORTIC VALVE REPLACEMENT, TRANSFEMORAL N/A 07/27/2016   Procedure: TRANSCATHETER AORTIC VALVE REPLACEMENT, TRANSFEMORAL;  Surgeon: [name], MD;  Location: [facility];  Service: Open Heart Surgery;  Laterality: N/A;    Current Outpatient Medications  Medication Sig Dispense Refill   aspirin EC 81 MG tablet Take 81 mg by mouth daily.     Calcium Carbonate-Vitamin D (CALCIUM 600/VITAMIN D PO) Take 1 tablet by mouth daily.     ergocalciferol (VITAMIN D2) 1.25 MG (50000 UT) capsule Take 50,000 Units by mouth once a week. Saturday's     fexofenadine (QC FEXOFENADINE HYDROCHLORIDE) 180 MG tablet Take 180 mg by mouth daily.     fish oil-omega-3 fatty acids 1000 MG capsule Take 1 g by mouth daily.     furosemide (LASIX) 20 MG tablet Take 2 tablets (40 mg total) by mouth daily. 180 tablet 3   levothyroxine (SYNTHROID, LEVOTHROID) 112 MCG tablet Take 112 mcg by mouth daily before breakfast.     LYSINE PO Take 1,000 mg by mouth daily.     Magnesium 400 MG CAPS Take 400 mg by mouth daily.      Menthol, Topical Analgesic, (BIOFREEZE EX) Apply 1 application topically as needed (pain).      Multiple Vitamin (MULTIVITAMIN WITH MINERALS) TABS tablet Take 1 tablet by mouth daily.     potassium chloride (KLOR-CON M) 10 MEQ tablet TAKE 1 TABLET BY MOUTH AS NEEDED( WITH LASIX) 30 tablet 7   vitamin B-12 (CYANOCOBALAMIN) 1000 MCG tablet Take 1,000 mcg by mouth daily.     vitamin C (ASCORBIC ACID) 500 MG tablet Take 500 mg by mouth daily.     Zinc 50 MG TABS Take 1 tablet by mouth daily.     No current facility-administered medications for this visit.    Allergies  Allergen Reactions   Atorvastatin Other (See Comments)    Myalgias    Compazine [Prochlorperazine] Other (See Comments)    Unknown, pt does not remember   Floxin [Ofloxacin] Other (See Comments)    Unknown, pt does not remember reaction but was allergic  reaction   Livalo [Pitavastatin] Other (See Comments)    Leg pain   Pravastatin Sodium Other (See Comments)    mylgias    Simvastatin Other (See Comments)    Elevated lft's 06/2011   Fenofibrate Rash    Rash     Social History   Socioeconomic History   Marital status: Widowed    Spouse name: Not on file   Number of children: Not on file   Years of education: Not on file   Highest education level: Not on file  Occupational History   Not on file  Tobacco Use   Smoking status: Never   Smokeless tobacco: Never  Vaping Use   Vaping Use: Never used  Substance and Sexual Activity  Alcohol use: No   Drug use: Never   Sexual activity: Not Currently    Birth control/protection: Post-menopausal  Other Topics Concern   Not on file  Social History Narrative   Not on file   Social Determinants of Health   Financial Resource Strain: Not on file  Food Insecurity: Not on file  Transportation Needs: Not on file  Physical Activity: Not on file  Stress: Not on file  Social Connections: Not on file  Intimate Partner Violence: Not on file    Family History  Problem Relation Age of Onset   Heart disease Mother    Heart disease Father     Review of Systems:  As stated in the HPI and otherwise negative.   BP 130/72   Pulse 68   Ht '5\' 2"'$  (1.575 m)   Wt 177 lb (80.3 kg)   LMP  (LMP Unknown)   SpO2 98%   BMI 32.37 kg/m   Physical Examination:  General: Well developed, well nourished, NAD  HEENT: OP clear, mucus membranes moist  SKIN: warm, dry. No rashes. Neuro: No focal deficits  Musculoskeletal: Muscle strength 5/5 all ext  Psychiatric: Mood and affect normal  Neck: No JVD, no carotid bruits, no thyromegaly, no lymphadenopathy.  Lungs:Clear bilaterally, no wheezes, rhonci, crackles Cardiovascular: Regular rate and rhythm. Soft systolic murmur.  Abdomen:Soft. Bowel sounds present. Non-tender.  Extremities: No lower extremity edema. Pulses are 2 + in the bilateral  DP/PT.  Echo September 2020:   1. Left ventricular ejection fraction, by visual estimation, is 55 to 60%. The left ventricle has normal function. Left ventricular septal wall thickness was mildly increased. There is mildly increased left ventricular hypertrophy.  2. Global right ventricle has normal systolic function.The right ventricular size is normal. No increase in right ventricular wall thickness.  3. Left atrial size was mildly dilated.  4. Right atrial size was normal.  5. The mitral valve is normal in structure. Trace mitral valve regurgitation.  6. The tricuspid valve is normal in structure. Tricuspid valve regurgitation is mild.  7. Aortic valve regurgitation was not visualized by color flow Doppler.  8. 26 mm Sapien 3 TAVR valve no PVL previous mean gradient July 2019 was 13 mmHg mildly higher this exam at 17 mmHg.  9. The pulmonic valve was grossly normal. Pulmonic valve regurgitation is trivial by color flow Doppler.    EKG:  EKG is ordered today. The ekg ordered today demonstrates NSR, non-specific T wave abn-unchanged  Recent Labs: 03/06/2021: BUN 19; Creatinine, Ser 1.03; Potassium 4.2; Sodium 142   Lipid Panel No results found for: "CHOL", "TRIG", "HDL", "CHOLHDL", "VLDL", "LDLCALC", "LDLDIRECT"   Wt Readings from Last 3 Encounters:  10/30/21 177 lb (80.3 kg)  07/22/21 178 lb (80.7 kg)  03/06/21 179 lb 12.8 oz (81.6 kg)    Assessment and Plan:   1. Severe aortic valve stenosis: She underwent TAVR with placement of a 26 mm Sapien 3 valve from the right transfemoral approach in July 2018. Her valve was working well by echo in September 2020. Will continue ASA and SBE prophylaxis. Given recent dyspnea, will arrange repeat echo now.   2. CAD without angina: She is known to have mild CAD by cath in 2017. No chest pain. Continue ASA. She is statin intolerant.  3.SVT/PACs/PVCs: She is having palpitations at night. Cardiac monitor in 2017 with PVCs/PACs. Will arrange 3 day  cardiac monitor.   4. Chronic diastolic CHF: Recent dyspnea that improved with extra Lasix. Volume  status ok today. Weight is stable Continue Lasix 40 mg per day and take extra lasix as needed.   5. Carotid artery disease: Mild to moderate bilateral carotid artery disease by dopplers November 2022. Repeat November 2024  Labs/ tests ordered today include:   Orders Placed This Encounter  Procedures   LONG TERM MONITOR (3-14 DAYS)   EKG 12-Lead   ECHOCARDIOGRAM COMPLETE   Disposition:   F/U with me in 12 months  Signed, Lauree Chandler, MD 10/30/2021 3:10 PM    [facility] [place], [place], [place]  [address] Phone: [phone]; Fax: [phone]

## 2021-10-30 ENCOUNTER — Ambulatory Visit: Payer: Medicare Other | Attending: Cardiovascular Disease | Admitting: Cardiovascular Disease

## 2021-10-30 ENCOUNTER — Encounter: Payer: Self-pay | Admitting: Cardiovascular Disease

## 2021-10-30 ENCOUNTER — Ambulatory Visit: Payer: Medicare Other | Attending: Cardiovascular Disease

## 2021-10-30 VITALS — BP 130/72 | HR 68 | Ht 62.0 in | Wt 177.0 lb

## 2021-10-30 DIAGNOSIS — Z952 Presence of prosthetic heart valve: Secondary | ICD-10-CM

## 2021-10-30 DIAGNOSIS — I359 Nonrheumatic aortic valve disorder, unspecified: Secondary | ICD-10-CM

## 2021-10-30 DIAGNOSIS — I5032 Chronic diastolic (congestive) heart failure: Secondary | ICD-10-CM

## 2021-10-30 DIAGNOSIS — I2510 Atherosclerotic heart disease of native coronary artery without angina pectoris: Secondary | ICD-10-CM | POA: Diagnosis not present

## 2021-10-30 DIAGNOSIS — R002 Palpitations: Secondary | ICD-10-CM

## 2021-10-30 DIAGNOSIS — I779 Disorder of arteries and arterioles, unspecified: Secondary | ICD-10-CM | POA: Diagnosis not present

## 2021-10-30 NOTE — Patient Instructions (Signed)
Medication Instructions:  No changes *If you need a refill on your cardiac medications before your next appointment, please call your pharmacy*   Lab Work: none  Testing/Procedures: Your physician has requested that you have an echocardiogram. Echocardiography is a painless test that uses sound waves to create images of your heart. It provides your doctor with information about the size and shape of your heart and how well your heart's chambers and valves are working. This procedure takes approximately one hour. There are no restrictions for this procedure. Please do NOT wear cologne, perfume, aftershave, or lotions (deodorant is allowed). Please arrive 15 minutes prior to your appointment time.  Zio Monitor -    Follow-Up: At [facility], you and your health needs are our priority.  As part of our continuing mission to provide you with exceptional heart care, we have created designated Provider Care Teams.  These Care Teams include your primary Cardiologist (physician) and Advanced Practice Providers (APPs -  Physician Assistants and Nurse Practitioners) who all work together to provide you with the care you need, when you need it.   Your next appointment:   6 month(s)  The format for your next appointment:   In Person  Provider:   Lauree Chandler, MD     Other Instructions ZIO XT- Long Term Monitor Instructions  Your physician has requested you wear a ZIO patch monitor for 3 days.  This is a single patch monitor. [employer] supplies one patch monitor per enrollment. Additional stickers are not available. Please do not apply patch if you will be having a Nuclear Stress Test,  Echocardiogram, Cardiac CT, MRI, or Chest Xray during the period you would be wearing the  monitor. The patch cannot be worn during these tests. You cannot remove and re-apply the  ZIO XT patch monitor.  Your ZIO patch monitor will be mailed 3 day USPS to your address on file. It may take 3-5  days  to receive your monitor after you have been enrolled.  Once you have received your monitor, please review the enclosed instructions. Your monitor  has already been registered assigning a specific monitor serial # to you.  Billing and Patient Assistance Program Information  We have supplied [employer] with any of your insurance information on file for billing purposes. [employer] offers a sliding scale Patient Assistance Program for patients that do not have  insurance, or whose insurance does not completely cover the cost of the ZIO monitor.  You must apply for the Patient Assistance Program to qualify for this discounted rate.  To apply, please call [employer] at [phone], select option 4, select option 2, ask to apply for  Patient Assistance Program. Theodore Demark will ask your household income, and how many people  are in your household. They will quote your out-of-pocket cost based on that information.  [employer] will also be able to set up a 81-month interest-free payment plan if needed.  Applying the monitor   Shave hair from upper left chest.  Hold abrader disc by orange tab. Rub abrader in 40 strokes over the upper left chest as  indicated in your monitor instructions.  Clean area with 4 enclosed alcohol pads. Let dry.  Apply patch as indicated in monitor instructions. Patch will be placed under collarbone on left  side of chest with arrow pointing upward.  Rub patch adhesive wings for 2 minutes. Remove white label marked "1". Remove the white  label marked "2". Rub patch adhesive wings for 2 additional minutes.  While looking in a mirror, press and release button in center of patch. A small green light will  flash 3-4 times. This will be your only indicator that the monitor has been turned on.  Do not shower for the first 24 hours. You may shower after the first 24 hours.  Press the button if you feel a symptom. You will hear a small click. Record Date, Time and  Symptom in the  Patient Logbook.  When you are ready to remove the patch, follow instructions on the last 2 pages of Patient  Logbook. Stick patch monitor onto the last page of Patient Logbook.  Place Patient Logbook in the blue and white box. Use locking tab on box and tape box closed  securely. The blue and white box has prepaid postage on it. Please place it in the mailbox as  soon as possible. Your physician should have your test results approximately 7 days after the  monitor has been mailed back to [facility].  Call [facility] at [phone] if you have questions regarding  your ZIO XT patch monitor. Call them immediately if you see an orange light blinking on your  monitor.  If your monitor falls off in less than 4 days, contact our Monitor department at [phone].  If your monitor becomes loose or falls off after 4 days call [employer] at [phone] for  suggestions on securing your monitor

## 2021-10-30 NOTE — Progress Notes (Unsigned)
Enrolled patient for a 3 day Zio XT monitor to be mailed to patients home  

## 2021-11-02 DIAGNOSIS — I2510 Atherosclerotic heart disease of native coronary artery without angina pectoris: Secondary | ICD-10-CM | POA: Diagnosis not present

## 2021-11-02 DIAGNOSIS — I359 Nonrheumatic aortic valve disorder, unspecified: Secondary | ICD-10-CM | POA: Diagnosis not present

## 2021-11-02 DIAGNOSIS — Z952 Presence of prosthetic heart valve: Secondary | ICD-10-CM | POA: Diagnosis not present

## 2021-11-02 DIAGNOSIS — R002 Palpitations: Secondary | ICD-10-CM

## 2021-11-02 DIAGNOSIS — I5032 Chronic diastolic (congestive) heart failure: Secondary | ICD-10-CM | POA: Diagnosis not present

## 2021-11-02 DIAGNOSIS — I779 Disorder of arteries and arterioles, unspecified: Secondary | ICD-10-CM | POA: Diagnosis not present

## 2021-11-13 ENCOUNTER — Ambulatory Visit (HOSPITAL_BASED_OUTPATIENT_CLINIC_OR_DEPARTMENT_OTHER): Payer: Medicare Other

## 2021-11-13 DIAGNOSIS — I779 Disorder of arteries and arterioles, unspecified: Secondary | ICD-10-CM

## 2021-11-13 DIAGNOSIS — I359 Nonrheumatic aortic valve disorder, unspecified: Secondary | ICD-10-CM

## 2021-11-13 DIAGNOSIS — Z952 Presence of prosthetic heart valve: Secondary | ICD-10-CM

## 2021-11-13 DIAGNOSIS — R002 Palpitations: Secondary | ICD-10-CM

## 2021-11-13 DIAGNOSIS — I5032 Chronic diastolic (congestive) heart failure: Secondary | ICD-10-CM

## 2021-11-13 DIAGNOSIS — I2510 Atherosclerotic heart disease of native coronary artery without angina pectoris: Secondary | ICD-10-CM

## 2021-11-13 LAB — ECHOCARDIOGRAM COMPLETE
AR max vel: 1.35 cm2
AV Area VTI: 1.26 cm2
AV Area mean vel: 1.26 cm2
AV Mean grad: 10 mmHg
AV Peak grad: 17.9 mmHg
Ao pk vel: 2.12 m/s
Area-P 1/2: 2.28 cm2
Calc EF: 61.8 %
S' Lateral: 2.39 cm
Single Plane A2C EF: 67.9 %
Single Plane A4C EF: 57.9 %

## 2021-11-16 DIAGNOSIS — Z952 Presence of prosthetic heart valve: Secondary | ICD-10-CM | POA: Diagnosis not present

## 2021-11-16 DIAGNOSIS — I359 Nonrheumatic aortic valve disorder, unspecified: Secondary | ICD-10-CM | POA: Diagnosis not present

## 2021-11-24 ENCOUNTER — Telehealth: Payer: Self-pay | Admitting: *Deleted

## 2021-11-24 MED ORDER — METOPROLOL SUCCINATE ER 25 MG PO TB24: 25.0000 mg | ORAL_TABLET | Freq: Every day | ORAL | 3 refills | Status: DC

## 2021-11-24 NOTE — Telephone Encounter (Signed)
-----   Message from [name], MD sent at 11/18/2021  8:55 AM EST ----- She is having short runs of SVT. This is benign but she will feel her heart skipping and racing when this happens. I would like to start Toprol 25 mg daily if she is willing. [name]

## 2021-11-24 NOTE — Telephone Encounter (Signed)
Spoke with patient and reviewed the results of the monitor.  She is going to take Toprol XL at night as most of the time the symptoms wake her around 4 am.  Scheduled her 6 month follow up.  Pt appreciative for the call.

## 2021-11-30 DIAGNOSIS — M1712 Unilateral primary osteoarthritis, left knee: Secondary | ICD-10-CM | POA: Diagnosis not present

## 2021-12-24 ENCOUNTER — Telehealth: Payer: Self-pay | Admitting: Cardiovascular Disease

## 2021-12-24 MED ORDER — DILTIAZEM HCL ER COATED BEADS 120 MG PO CP24: 120.0000 mg | ORAL_CAPSULE | Freq: Every day | ORAL | 11 refills | Status: DC

## 2021-12-24 NOTE — Telephone Encounter (Signed)
Called pt advised of MD recommendation:  an we have her stop the Toprol and start Cardizem CD 120 mg per day and see how she tolerates this? We are treating short runs of SVT and palpitations. Thanks, chris   Pt is agreeable to plan.  Reports BP today 144/74. Pt would like a 30 day supply to start and will change to 90 if can tolerate medication.

## 2021-12-24 NOTE — Telephone Encounter (Signed)
  Pt c/o medication issue:  1. Name of Medication: metoprolol succinate (TOPROL XL) 25 MG 24 hr tablet   2. How are you currently taking this medication (dosage and times per day)? Take 1 tablet (25 mg total) by mouth at bedtime.   3. Are you having a reaction (difficulty breathing--STAT)? No   4. What is your medication issue? Pt is calling, she said, she is not been feeling good when she started this medication. She said, she's been feeling weak. Would like to speak with a nurse to discuss

## 2021-12-24 NOTE — Telephone Encounter (Signed)
Spoke with pt who is reporting she has been tired since starting metoprolol approximately 1 month ago.  She takes her medication at night and usually feels fatigue in the mornings but seems to get a little better by noon.  This morning when she woke, she had a "different kind of tired" where she felt like she couldn't even move or didn't really want to.  She has not taken her HR or BP recently but notes her palpitations are much better.  Advised I will sent this information to Dr Angelena Form for review and will call back with any new information/orders.  Advised to take her VS daily if possible.  Pt is in agreement.

## 2021-12-29 ENCOUNTER — Telehealth: Payer: Self-pay | Admitting: Cardiovascular Disease

## 2021-12-29 NOTE — Telephone Encounter (Signed)
Pt aware of Dr Camillia Herter recommendations .[name]

## 2021-12-29 NOTE — Telephone Encounter (Signed)
Spoke with pt and noted the 2 episodes of afib (according to watch ) Per pt felt okay does have SOB but this is not new Will forward to Dr Angelena Form for review and recommendations Per pt HR at this am is 88

## 2021-12-29 NOTE — Telephone Encounter (Signed)
Patient c/o Palpitations:  High priority if patient c/o lightheadedness, shortness of breath, or chest pain  How long have you had palpitations/irregular HR/ Afib? Are you having the symptoms now? Yesterday 11 am, then last night 1:30 am went into afib again, no symptoms now  Are you currently experiencing lightheadedness, SOB or CP? No Do you have a history of afib (atrial fibrillation) or irregular heart rhythm? no  Have you checked your BP or HR? (document readings if available): 149/86 a few minutes ago  Are you experiencing any other symptoms? No  Patient states she went into afib for the first time yesterday and this morning. She say she was notified by her phone. She says she is not currently having symptoms. She says she did take her lasix, and was put on Cardizem recently.

## 2021-12-30 ENCOUNTER — Emergency Department (HOSPITAL_BASED_OUTPATIENT_CLINIC_OR_DEPARTMENT_OTHER): Payer: Medicare Other

## 2021-12-30 ENCOUNTER — Inpatient Hospital Stay (HOSPITAL_BASED_OUTPATIENT_CLINIC_OR_DEPARTMENT_OTHER)
Admission: EM | Admit: 2021-12-30 | Discharge: 2022-01-02 | DRG: 644 | Disposition: A | Discharge status: Home / Self Care | Payer: Medicare Other | Attending: Cardiovascular Disease | Admitting: Cardiovascular Disease

## 2021-12-30 ENCOUNTER — Telehealth: Payer: Self-pay | Admitting: Cardiovascular Disease

## 2021-12-30 ENCOUNTER — Other Ambulatory Visit: Payer: Self-pay

## 2021-12-30 DIAGNOSIS — M1990 Unspecified osteoarthritis, unspecified site: Secondary | ICD-10-CM | POA: Diagnosis present

## 2021-12-30 DIAGNOSIS — E785 Hyperlipidemia, unspecified: Secondary | ICD-10-CM | POA: Diagnosis present

## 2021-12-30 DIAGNOSIS — Z79890 Hormone replacement therapy (postmenopausal): Secondary | ICD-10-CM | POA: Diagnosis not present

## 2021-12-30 DIAGNOSIS — Z9011 Acquired absence of right breast and nipple: Secondary | ICD-10-CM | POA: Diagnosis not present

## 2021-12-30 DIAGNOSIS — M810 Age-related osteoporosis without current pathological fracture: Secondary | ICD-10-CM | POA: Diagnosis present

## 2021-12-30 DIAGNOSIS — Z1152 Encounter for screening for COVID-19: Secondary | ICD-10-CM | POA: Diagnosis not present

## 2021-12-30 DIAGNOSIS — Z7901 Long term (current) use of anticoagulants: Secondary | ICD-10-CM | POA: Diagnosis not present

## 2021-12-30 DIAGNOSIS — N1831 Chronic kidney disease, stage 3a: Secondary | ICD-10-CM | POA: Diagnosis not present

## 2021-12-30 DIAGNOSIS — E1122 Type 2 diabetes mellitus with diabetic chronic kidney disease: Secondary | ICD-10-CM | POA: Diagnosis present

## 2021-12-30 DIAGNOSIS — I700 Atherosclerosis of aorta: Secondary | ICD-10-CM | POA: Diagnosis present

## 2021-12-30 DIAGNOSIS — Z8249 Family history of ischemic heart disease and other diseases of the circulatory system: Secondary | ICD-10-CM

## 2021-12-30 DIAGNOSIS — Z96651 Presence of right artificial knee joint: Secondary | ICD-10-CM | POA: Diagnosis present

## 2021-12-30 DIAGNOSIS — R079 Chest pain, unspecified: Secondary | ICD-10-CM | POA: Diagnosis not present

## 2021-12-30 DIAGNOSIS — Z7982 Long term (current) use of aspirin: Secondary | ICD-10-CM

## 2021-12-30 DIAGNOSIS — I340 Nonrheumatic mitral (valve) insufficiency: Secondary | ICD-10-CM | POA: Diagnosis not present

## 2021-12-30 DIAGNOSIS — Z96643 Presence of artificial hip joint, bilateral: Secondary | ICD-10-CM | POA: Diagnosis present

## 2021-12-30 DIAGNOSIS — E1151 Type 2 diabetes mellitus with diabetic peripheral angiopathy without gangrene: Secondary | ICD-10-CM | POA: Diagnosis present

## 2021-12-30 DIAGNOSIS — Z953 Presence of xenogenic heart valve: Secondary | ICD-10-CM

## 2021-12-30 DIAGNOSIS — I2510 Atherosclerotic heart disease of native coronary artery without angina pectoris: Secondary | ICD-10-CM | POA: Diagnosis present

## 2021-12-30 DIAGNOSIS — Z79899 Other long term (current) drug therapy: Secondary | ICD-10-CM | POA: Diagnosis not present

## 2021-12-30 DIAGNOSIS — Z888 Allergy status to other drugs, medicaments and biological substances status: Secondary | ICD-10-CM

## 2021-12-30 DIAGNOSIS — Z853 Personal history of malignant neoplasm of breast: Secondary | ICD-10-CM | POA: Diagnosis not present

## 2021-12-30 DIAGNOSIS — I083 Combined rheumatic disorders of mitral, aortic and tricuspid valves: Secondary | ICD-10-CM | POA: Diagnosis not present

## 2021-12-30 DIAGNOSIS — I130 Hypertensive heart and chronic kidney disease with heart failure and stage 1 through stage 4 chronic kidney disease, or unspecified chronic kidney disease: Secondary | ICD-10-CM | POA: Diagnosis not present

## 2021-12-30 DIAGNOSIS — I5032 Chronic diastolic (congestive) heart failure: Secondary | ICD-10-CM | POA: Diagnosis not present

## 2021-12-30 DIAGNOSIS — I480 Paroxysmal atrial fibrillation: Secondary | ICD-10-CM | POA: Diagnosis present

## 2021-12-30 DIAGNOSIS — I4891 Unspecified atrial fibrillation: Secondary | ICD-10-CM | POA: Diagnosis not present

## 2021-12-30 DIAGNOSIS — I25110 Atherosclerotic heart disease of native coronary artery with unstable angina pectoris: Secondary | ICD-10-CM | POA: Diagnosis not present

## 2021-12-30 DIAGNOSIS — I739 Peripheral vascular disease, unspecified: Secondary | ICD-10-CM | POA: Diagnosis not present

## 2021-12-30 DIAGNOSIS — E039 Hypothyroidism, unspecified: Principal | ICD-10-CM | POA: Diagnosis present

## 2021-12-30 DIAGNOSIS — N189 Chronic kidney disease, unspecified: Secondary | ICD-10-CM | POA: Diagnosis not present

## 2021-12-30 DIAGNOSIS — I088 Other rheumatic multiple valve diseases: Secondary | ICD-10-CM | POA: Diagnosis not present

## 2021-12-30 LAB — RESPIRATORY PANEL BY PCR

## 2021-12-30 LAB — TROPONIN I (HIGH SENSITIVITY)
Troponin I (High Sensitivity): 10 ng/L (ref <18)
Troponin I (High Sensitivity): 10 ng/L (ref <18)

## 2021-12-30 LAB — RESP PANEL BY RT-PCR (RSV, FLU A&B, COVID)  RVPGX2
Influenza A by PCR: NEGATIVE
Influenza B by PCR: NEGATIVE
Resp Syncytial Virus by PCR: NEGATIVE
SARS Coronavirus 2 by RT PCR: NEGATIVE

## 2021-12-30 LAB — TSH: TSH: 3.975 u[IU]/mL (ref 0.350–4.500)

## 2021-12-30 LAB — BASIC METABOLIC PANEL
Anion gap: 13 (ref 5–15)
BUN: 25 mg/dL — ABNORMAL HIGH (ref 8–23)
CO2: 29 mmol/L (ref 22–32)
Calcium: 9.6 mg/dL (ref 8.9–10.3)
Chloride: 97 mmol/L — ABNORMAL LOW (ref 98–111)
Creatinine, Ser: 1.17 mg/dL — ABNORMAL HIGH (ref 0.44–1.00)
GFR, Estimated: 45 mL/min — ABNORMAL LOW (ref >60)
Glucose, Bld: 114 mg/dL — ABNORMAL HIGH (ref 70–99)
Potassium: 3.5 mmol/L (ref 3.5–5.1)
Sodium: 139 mmol/L (ref 135–145)

## 2021-12-30 LAB — CBC
HCT: 41.9 % (ref 36.0–46.0)
Hemoglobin: 14.1 g/dL (ref 12.0–15.0)
MCH: 32.1 pg (ref 26.0–34.0)
MCHC: 33.7 g/dL (ref 30.0–36.0)
MCV: 95.4 fL (ref 80.0–100.0)
Platelets: 215 10*3/uL (ref 150–400)
RBC: 4.39 MIL/uL (ref 3.87–5.11)
RDW: 13.7 % (ref 11.5–15.5)
WBC: 10.4 10*3/uL (ref 4.0–10.5)
nRBC: 0 % (ref 0.0–0.2)

## 2021-12-30 LAB — MAGNESIUM: Magnesium: 2.3 mg/dL (ref 1.7–2.4)

## 2021-12-30 MED ORDER — DILTIAZEM HCL ER COATED BEADS 180 MG PO CP24: 180.0000 mg | ORAL_CAPSULE | Freq: Every day | ORAL | Status: DC

## 2021-12-30 MED ORDER — FUROSEMIDE 40 MG PO TABS: 40.0000 mg | ORAL_TABLET | Freq: Every day | ORAL | Status: DC

## 2021-12-30 MED ORDER — ACETAMINOPHEN 325 MG PO TABS
650.0000 mg | ORAL_TABLET | ORAL | Status: DC | PRN
  Administered 2021-12-31 – 2022-01-02 (×3): 650 mg via ORAL
  Filled 2021-12-30 (×3): qty 2

## 2021-12-30 MED ORDER — DILTIAZEM LOAD VIA INFUSION
15.0000 mg | Freq: Once | INTRAVENOUS | Status: AC
  Administered 2021-12-30: 15 mg via INTRAVENOUS
  Filled 2021-12-30: qty 15

## 2021-12-30 MED ORDER — VITAMIN B-12 1000 MCG PO TABS
1000.0000 ug | ORAL_TABLET | Freq: Every day | ORAL | Status: DC
  Administered 2021-12-31 – 2022-01-02 (×3): 1000 ug via ORAL
  Filled 2021-12-30 (×3): qty 1

## 2021-12-30 MED ORDER — LORATADINE 10 MG PO TABS
10.0000 mg | ORAL_TABLET | Freq: Every day | ORAL | Status: DC
  Administered 2021-12-31 – 2022-01-02 (×3): 10 mg via ORAL
  Filled 2021-12-30 (×3): qty 1

## 2021-12-30 MED ORDER — LEVOTHYROXINE SODIUM 112 MCG PO TABS
112.0000 ug | ORAL_TABLET | Freq: Every day | ORAL | Status: DC
  Administered 2021-12-31 – 2022-01-02 (×2): 112 ug via ORAL
  Filled 2021-12-30 (×3): qty 1

## 2021-12-30 MED ORDER — DILTIAZEM HCL-DEXTROSE 125-5 MG/125ML-% IV SOLN (PREMIX)
5.0000 mg/h | INTRAVENOUS | Status: DC
  Administered 2021-12-30: 5 mg/h via INTRAVENOUS
  Administered 2021-12-31 – 2022-01-01 (×4): 7.5 mg/h via INTRAVENOUS
  Filled 2021-12-30 (×4): qty 125

## 2021-12-30 MED ORDER — POTASSIUM CHLORIDE CRYS ER 20 MEQ PO TBCR
40.0000 meq | EXTENDED_RELEASE_TABLET | Freq: Once | ORAL | Status: AC
  Administered 2021-12-30: 40 meq via ORAL
  Filled 2021-12-30: qty 2

## 2021-12-30 MED ORDER — OMEGA-3-ACID ETHYL ESTERS 1 G PO CAPS
1.0000 g | ORAL_CAPSULE | Freq: Every day | ORAL | Status: DC
  Administered 2021-12-31 – 2022-01-02 (×3): 1 g via ORAL
  Filled 2021-12-30 (×3): qty 1

## 2021-12-30 MED ORDER — APIXABAN 5 MG PO TABS
5.0000 mg | ORAL_TABLET | Freq: Two times a day (BID) | ORAL | Status: DC
  Administered 2021-12-30 – 2022-01-02 (×7): 5 mg via ORAL
  Filled 2021-12-30 (×2): qty 1
  Filled 2021-12-30: qty 2
  Filled 2021-12-30 (×4): qty 1

## 2021-12-30 NOTE — ED Notes (Signed)
15:11 Thomas from [facility] called to advise Bed Ready. Will send transport soon.-ABB(NS)

## 2021-12-30 NOTE — Telephone Encounter (Signed)
Called pt daughter who reports pt has called her multiple times today c/o left shoulder pain, under breat/ rib cage.  Also c/o tingling to left hand.  Also, reports pt has a history of SOB do to CHF.  Pt c/o watch continuously alerting Afib.     Reports BP is ok 140's/70-80's.  Denies pain feels like an elephant is sitting on chest.  Per daughter feels more like a catch.  Reports is going to send pt to [facility] ED for evaluation then asked if I think pt should go.  I advised that if pt has a concern I would never deny an ED visit.  If pt is concerned about health and feels ED visit is needed GO.  Pt is scheduled to see Swinyer, NP on 01/01/22.   Daughter will call back later to update if visit is still needed.

## 2021-12-30 NOTE — ED Provider Notes (Signed)
[facility] EMERGENCY DEPT Provider Note   CSN: [id number] Arrival date & time: 12/30/21  1144     History  Chief Complaint  Patient presents with   Chest Pain    [name] is a 88 y.o. female.  She is here with complaint of an irregular heartbeat that started 4 days ago.  Her Apple watch was alerting her about this.  She did not feel bad until last night when she started having pain in her chest radiating into her shoulders.  She rates the pain as severe.  She follows with Dr. Angelena Form cardiology.  No history of A-fib and not on blood thinners.  She was recently started on Cardizem  The history is provided by the patient.  Chest Pain Pain location:  Substernal area and L chest Pain quality: aching   Pain radiates to:  L arm, R arm and upper back Pain severity:  Severe Onset quality:  Gradual Duration:  18 hours Timing:  Intermittent Progression:  Unchanged Chronicity:  New Relieved by:  Nothing Worsened by:  Nothing Ineffective treatments:  Rest Associated symptoms: numbness and shortness of breath   Associated symptoms: no abdominal pain, no cough, no diaphoresis, no fever, no nausea and no vomiting   Risk factors: no smoking        Home Medications Prior to Admission medications   Medication Sig Start Date End Date Taking? Authorizing Provider  aspirin EC 81 MG tablet Take 81 mg by mouth daily.    [provider]  Calcium Carbonate-Vitamin D (CALCIUM 600/VITAMIN D PO) Take 1 tablet by mouth daily.    [provider]  diltiazem (CARDIZEM CD) 120 MG 24 hr capsule Take 1 capsule (120 mg total) by mouth daily. 12/24/21   [name], MD  ergocalciferol (VITAMIN D2) 1.25 MG (50000 UT) capsule Take 50,000 Units by mouth once a week. Saturday's    [provider]  fexofenadine (QC FEXOFENADINE HYDROCHLORIDE) 180 MG tablet Take 180 mg by mouth daily.    [provider]  fish oil-omega-3 fatty acids 1000 MG  capsule Take 1 g by mouth daily.    [provider]  furosemide (LASIX) 20 MG tablet Take 2 tablets (40 mg total) by mouth daily. 01/08/21   [name], MD  levothyroxine (SYNTHROID, LEVOTHROID) 112 MCG tablet Take 112 mcg by mouth daily before breakfast.    [provider]  LYSINE PO Take 1,000 mg by mouth daily.    [provider]  Magnesium 400 MG CAPS Take 400 mg by mouth daily.     [provider]  Menthol, Topical Analgesic, (BIOFREEZE EX) Apply 1 application topically as needed (pain).     [provider]  Multiple Vitamin (MULTIVITAMIN WITH MINERALS) TABS tablet Take 1 tablet by mouth daily.    [provider]  potassium chloride (KLOR-CON M) 10 MEQ tablet TAKE 1 TABLET BY MOUTH AS NEEDED( WITH LASIX) 08/12/21   [name], MD  vitamin B-12 (CYANOCOBALAMIN) 1000 MCG tablet Take 1,000 mcg by mouth daily.    [provider]  vitamin C (ASCORBIC ACID) 500 MG tablet Take 500 mg by mouth daily.    [provider]  Zinc 50 MG TABS Take 1 tablet by mouth daily.    [provider]      Allergies    Atorvastatin, Compazine [prochlorperazine], Floxin [ofloxacin], Livalo [pitavastatin], Pravastatin sodium, Simvastatin, and Fenofibrate    Review of Systems   Review of Systems  Constitutional:  Negative for diaphoresis and fever.  HENT:  Negative for sore throat.   Respiratory:  Positive for shortness of breath. Negative for cough.   Cardiovascular:  Positive for chest pain.  Gastrointestinal:  Negative for abdominal pain, nausea and vomiting.  Genitourinary:  Negative for dysuria.  Skin:  Negative for rash.  Neurological:  Positive for numbness.    Physical Exam Updated Vital Signs BP (!) 169/103 (BP Location: Right Arm)   Pulse 86   Temp 97.7 F (36.5 C)   Resp 20   Ht '5\' 2"'$  (1.575 m)   Wt 80.3 kg   LMP  (LMP Unknown)   SpO2 97%   BMI 32.38 kg/m  Physical Exam Vitals and  nursing note reviewed.  Constitutional:      General: She is not in acute distress.    Appearance: She is well-developed.  HENT:     Head: Normocephalic and atraumatic.  Eyes:     Conjunctiva/sclera: Conjunctivae normal.  Cardiovascular:     Rate and Rhythm: Tachycardia present. Rhythm irregular.     Heart sounds: Normal heart sounds. No murmur heard. Pulmonary:     Effort: Pulmonary effort is normal. No respiratory distress.     Breath sounds: Normal breath sounds.  Abdominal:     Palpations: Abdomen is soft.     Tenderness: There is no abdominal tenderness. There is no guarding or rebound.  Musculoskeletal:        General: No swelling.     Cervical back: Neck supple.     Right lower leg: No tenderness.     Left lower leg: No tenderness.  Skin:    General: Skin is warm and dry.     Capillary Refill: Capillary refill takes less than 2 seconds.  Neurological:     General: No focal deficit present.     Mental Status: She is alert.  Psychiatric:        Mood and Affect: Mood normal.     ED Results / Procedures / Treatments   Labs (all labs ordered are listed, but only abnormal results are displayed) Labs Reviewed  BASIC METABOLIC PANEL - Abnormal; Notable for the following components:      Result Value   Chloride 97 (*)    Glucose, Bld 114 (*)    BUN 25 (*)    Creatinine, Ser 1.17 (*)    GFR, Estimated 45 (*)    All other components within normal limits  RESPIRATORY PANEL BY PCR  RESP PANEL BY RT-PCR (RSV, FLU A&B, COVID)  [id number]  CBC  MAGNESIUM  TSH  BASIC METABOLIC PANEL  CBC  TROPONIN I (HIGH SENSITIVITY)  TROPONIN I (HIGH SENSITIVITY)    EKG EKG Interpretation  Date/Time:  Wednesday December 30 2021 11:52:48 EST Ventricular Rate:  130 PR Interval:    QRS Duration: 82 QT Interval:  324 QTC Calculation: 476 R Axis:   49 Text Interpretation: Atrial fibrillation with rapid ventricular response Marked ST abnormality, possible inferior subendocardial injury  Abnormal ECG When compared with ECG of 28-Jul-2016 06:46, Atrial fibrillation has replaced Sinus rhythm Vent. rate has increased BY  79 BPM ST now depressed in Inferior leads ST now depressed in Anterolateral leads T wave inversion now evident in Anterior leads new from prior 7/18 Confirmed by Aletta Edouard [phone]) on 12/30/2021 11:56:03 AM  Radiology No results found.  Procedures .Critical Care  Performed by: Hayden Rasmussen, MD Authorized by: Hayden Rasmussen, MD   Critical care provider statement:  Critical care time (minutes):  45   Critical care time was exclusive of:  Separately billable procedures and treating other patients   Critical care was necessary to treat or prevent imminent or life-threatening deterioration of the following conditions:  Cardiac failure   Critical care was time spent personally by me on the following activities:  Development of treatment plan with patient or surrogate, discussions with consultants, evaluation of patient's response to treatment, examination of patient, obtaining history from patient or surrogate, ordering and performing treatments and interventions, ordering and review of laboratory studies, ordering and review of radiographic studies, pulse oximetry, re-evaluation of patient's condition and review of old charts   I assumed direction of critical care for this patient from another provider in my specialty: no       Medications Ordered in ED Medications  diltiazem (CARDIZEM) 1 mg/mL load via infusion 15 mg (15 mg Intravenous Bolus from Bag 12/30/21 1224)    And  diltiazem (CARDIZEM) 125 mg in dextrose 5% 125 mL (1 mg/mL) infusion (7.5 mg/hr Intravenous Rate/Dose Change 12/30/21 1432)  apixaban (ELIQUIS) tablet 5 mg (5 mg Oral Given 12/30/21 1325)  furosemide (LASIX) tablet 40 mg (40 mg Oral Not Given 12/30/21 1427)  levothyroxine (SYNTHROID) tablet 112 mcg (has no administration in time range)  loratadine (CLARITIN) tablet 10 mg (10 mg  Oral Not Given 12/30/21 1427)  acetaminophen (TYLENOL) tablet 650 mg (has no administration in time range)  diltiazem (CARDIZEM CD) 24 hr capsule 180 mg (has no administration in time range)  cyanocobalamin (VITAMIN B12) tablet 1,000 mcg (has no administration in time range)  fish oil-omega-3 fatty acids capsule 1 g (has no administration in time range)  potassium chloride SA (KLOR-CON M) CR tablet 40 mEq (has no administration in time range)    ED Course/ Medical Decision Making/ A&P Clinical Course as of 12/30/21 1753  Wed Dec 30, 2021  1233 Chest x-ray interpreted by me as no acute infiltrate.  Awaiting radiology reading. [MB]  1247 Discussed with Dr. Angelena Form cardiology.  He is recommending starting her on Eliquis, continuing Cardizem drip, and admit to his service at [facility] [MB]  1251 I reviewed the patient's CODE STATUS with her and she is a full code [MB]    Clinical Course User Index [MB] Hayden Rasmussen, MD                           Medical Decision Making Amount and/or Complexity of Data Reviewed Labs: ordered. Radiology: ordered.  Risk OTC drugs. Prescription drug management. Decision regarding hospitalization.   This patient complains of chest pain fast heart rate; this involves an extensive number of treatment Options and is a complaint that carries with it a high risk of complications and morbidity. The differential includes atrial fibrillation, SVT, sinus tachycardia, ACS, PE  I ordered, reviewed and interpreted labs, which included CBC with normal white count normal hemoglobin, chemistries with mild elevation of BUN and creatinine, troponins flat, TSH normal I ordered medication IV Cardizem oral apixaban and reviewed PMP when indicated. I ordered imaging studies which included chest x-ray and I independently    visualized and interpreted imaging which showed no acute findings Additional history obtained from patient's husband Previous records obtained and  reviewed in epic including recent cardiology notes I consulted Dr. Angelena Form cardiology and discussed lab and imaging findings and discussed disposition.  Cardiac monitoring reviewed, atrial fibrillation with rapid ventricular response Social determinants considered, none Critical Interventions:  Initiation of IV rate control  After the interventions stated above, I reevaluated the patient and found patient to be symptomatically improved Admission and further testing considered, she will need admission to the hospital for further management of her recent onset A-fib.  Patient in agreement with plan for admission. CHA2DS2/VAS Stroke Risk Points  Current as of 6 minutes ago     5 >= 2 Points: High Risk  1 - 1.99 Points: Medium Risk  0 Points: Low Risk    No Change      Details    This score determines the patient's risk of having a stroke if the  patient has atrial fibrillation.       Points Metrics  1 Has Congestive Heart Failure:  Yes    Current as of 6 minutes ago  1 Has Vascular Disease:  Yes    Current as of 6 minutes ago  0 Has Hypertension:  No    Current as of 6 minutes ago  2 Age:  72    Current as of 6 minutes ago  0 Has Diabetes:  No    Current as of 6 minutes ago  0 Had Stroke:  No  Had TIA:  No  Had Thromboembolism:  No    Current as of 6 minutes ago  1 Female:  Yes    Current as of 6 minutes ago                  Final Clinical Impression(s) / ED Diagnoses Final diagnoses:  Atrial fibrillation with rapid ventricular response ([place])    Rx / DC Orders ED Discharge Orders     None         Hayden Rasmussen, MD 12/30/21 1755

## 2021-12-30 NOTE — H&P (Addendum)
Cardiology Admission History and Physical   Patient ID: DEAVEN BARRON MRN: [id number]; DOB: 07/31/1931   Admission date: 12/30/2021  PCP:  Gaynelle Arabian, [place] [facility] Cardiologist:  Lauree Chandler, MD   {   Chief Complaint:  chest pain  Patient Profile:   [name] is a 88 y.o. female with non-obstructive CAD, aortic stenosis s/p TAVR 07/27/16, SVT, chronic diastolic heart failure,  CKD, HLD, hypothyroidism, breast cancer s/p lumpectomy and chemoradiation, cartoid artery disease, who is being seen 12/30/2021 for the evaluation of A fib RVR.  History of Present Illness:   [name] with PMH above presented to ER at [facility] for left sided chest pain started this morning.  She states her Apple Watch informed her that she is in atrial fibrillation since Sunday at 11 AM.  She has been very nervous over the past 2 to 3 days.  She endorses chronic shortness of breath, that does not seem to be changed from baseline.  She woke up with left-sided/axillary pain this morning.  She has not had any episodes of heart palpitation or irregular heart beat sensation. She states she is not aware of her A fib but very nervous about the diagnosis.  She noted a cough that started this morning.  He denies any fever, chills, headache, congestion, nausea, vomiting, abdominal pain, diarrhea, dysuria.  She is currently chest pain-free.  Has been taking Cardizem since Friday where it was started for her SVT.  She denied any weight gain, leg edema.   Per chart review:  She follows Dr Radford Pax in the past and transitioned to Dr Angelena Form in 2018. She had aortic stenosis that gradually progressed in 2017, cardiac cath 07/18/15 showed mild non-obstructive CAD (20% LAD) and moderate aortic stenosis with peak to peak gradient 20 mmHg and AVA 1.1cm2.  Follow-up Echo on 05/11/2016 revealed LVEF 55 to 60%, severe aortic stenosis with peak velocity across the aortic valve measured  greater than 4.1 m/s corresponding to mean transvalvular gradient estimated 37 mmHg. ETT 05/20/16 showed no ischemia. She underwent successful TAVR with an Edwards Sapien 3 THV (size 79m) on 07/27/16 with Dr. MAngelena Formand Dr. BCyndia Bentfrom the right femoral artery approach. She is doing well post procedure, post operative Echo showed a normally functioning AVR. She had acute diastolic heart failure in 09/2018 where she was instructed to take additional lasix during office visit and she responded well. She was last seen in the office 10/30/21 by Dr MAngelena Form doing well without CHF symptoms. She had reported 2 episodes of heart flutter sensation at night and took extra lasix for weight gain >2-3 pounds. She was recommended continue ASA and SBE prophylaxis. Echo was repeated on 11/2021 showed LVEF 55-60%, grade II DD, normal RV, mod LAE, no PVL, AV max 2.1 m/s. DI 0.44. Mean gradient 10 mmHg, Normal Prosthesis, no AI, improved from 09/22/2018. Of note she is historically intolerant to statin. She had heart palpitation and had SVT/PACs/PVCs in the past event monitor in 2017 as well as recently on 10/30/2021.  Admission diagnostic today showed Cr 1.17, GFR 45, BUN 25. Hs trop 10 >10. CBC grossly unremarkable. TSH WNL. CXR showed no acute finding. Mag 2.3. EKG showed A fib RVR 130bpm, ST depression noted of inferior and anterolateral leads. She was started on cardizem gtt and eliquis '5mg'$  BID. [facility]     Past Medical History:  Diagnosis Date   Arthritis  hands, knees   CAD (coronary artery disease), native coronary artery cardiologist--- dr Angelena Form   a. mild non obst CAD per cath 07-18-2015   Carotid artery disease ([place])    Carotid US 09/2018: Bilateral ICA 40-59 // Carotid US 11/21: Bilateral ICA 40-59; repeat 1 year    Chronic diastolic CHF (congestive heart failure) ([place])    followed by cardiology   CKD (chronic kidney disease), stage III ([place])     followed by pcp   Dyspnea    per pt when she walks which is usual for her   History of gastric ulcer    remote yrs ago   History of rheumatic fever as a child    History of right breast cancer 2006   s/p right partial mastectomy w/ node dissection's,  completed chemo/ radiation same year  (05-21-2020 pt stated no recurrence)   Hyperlipidemia    Hypothyroidism    followed by pcp   Osteoporosis    PMB (postmenopausal bleeding)    PSVT (paroxysmal supraventricular tachycardia) 10/2012   in ED resolved w/ vagal maneuver    S/P aortic valve replacement 07/27/2016   for severe stenosis;  last echo in epic 09-22-2018 ef 55-60%, mild TR, aortic valve area 1.4cm^2 and mean gradiant 17 mmHg   Seasonal allergies    Vitamin D deficiency     Past Surgical History:  Procedure Laterality Date   CARDIAC CATHETERIZATION N/A 07/18/2015   Procedure: Right/Left Heart Cath and Coronary Angiography;  Surgeon: [name], MD;  Location: [facility];  Service: Cardiovascular;  Laterality: N/A;   CATARACT EXTRACTION W/ INTRAOCULAR LENS  IMPLANT, BILATERAL     2017   CHOLECYSTECTOMY N/A 08/06/2014   Procedure: LAPAROSCOPIC CHOLECYSTECTOMY ;  Surgeon: Rolm Bookbinder, MD;  Location: [place];  Service: General;  Laterality: N/A;   HYSTEROSCOPY WITH D & C N/A 05/26/2020   Procedure: DILATATION AND CURETTAGE /HYSTEROSCOPY;  Surgeon: Salvadore Dom, MD;  Location: [place];  Service: Gynecology;  Laterality: N/A;  Request case for [place]   PARTIAL MASTECTOMY WITH AXILLARY SENTINEL LYMPH NODE BIOPSY Right 2006   TEE WITHOUT CARDIOVERSION N/A 07/27/2016   Procedure: TRANSESOPHAGEAL ECHOCARDIOGRAM (TEE);  Surgeon: [name], MD;  Location: [place];  Service: Open Heart Surgery;  Laterality: N/A;   THYROID SURGERY  1969   NODULE REMOVED, benign per pt   TONSILLECTOMY  age 4   TOTAL HIP ARTHROPLASTY Left 2003   TOTAL HIP ARTHROPLASTY Right 08/28/2013   Procedure:  RIGHT TOTAL HIP ARTHROPLASTY ANTERIOR APPROACH;  Surgeon: Mauri Pole, MD;  Location: [facility];  Service: Orthopedics;  Laterality: Right;   TOTAL KNEE ARTHROPLASTY Right 01-16-2007  '@[facility]'$    TRANSCATHETER AORTIC VALVE REPLACEMENT, TRANSFEMORAL N/A 07/27/2016   Procedure: TRANSCATHETER AORTIC VALVE REPLACEMENT, TRANSFEMORAL;  Surgeon: [name], MD;  Location: [place];  Service: Open Heart Surgery;  Laterality: N/A;     Medications Prior to Admission: Prior to Admission medications   Medication Sig Start Date End Date Taking? Authorizing Provider  aspirin EC 81 MG tablet Take 81 mg by mouth daily.   Yes [provider]  Calcium Carbonate-Vitamin D (CALCIUM 600/VITAMIN D PO) Take 1 tablet by mouth daily.   Yes [provider]  diltiazem (CARDIZEM CD) 120 MG 24 hr capsule Take 1 capsule (120 mg total) by mouth daily. 12/24/21  Yes [name], MD  fexofenadine (QC FEXOFENADINE HYDROCHLORIDE) 180 MG tablet Take 180 mg by mouth daily.  Yes [provider]  furosemide (LASIX) 20 MG tablet Take 2 tablets (40 mg total) by mouth daily. 01/08/21  Yes [name], MD  levothyroxine (SYNTHROID, LEVOTHROID) 112 MCG tablet Take 112 mcg by mouth daily before breakfast.   Yes [provider]  Magnesium 400 MG CAPS Take 400 mg by mouth daily.    Yes [provider]  Menthol, Topical Analgesic, (BIOFREEZE EX) Apply 1 application topically as needed (pain).    Yes [provider]  Multiple Vitamin (MULTIVITAMIN WITH MINERALS) TABS tablet Take 1 tablet by mouth daily.   Yes [provider]  potassium chloride (KLOR-CON M) 10 MEQ tablet TAKE 1 TABLET BY MOUTH AS NEEDED( WITH LASIX) 08/12/21  Yes [name], MD  vitamin B-12 (CYANOCOBALAMIN) 1000 MCG tablet Take 1,000 mcg by mouth daily.   Yes [provider]  vitamin C (ASCORBIC ACID) 500 MG tablet Take 500 mg by mouth daily.   Yes [provider]  ergocalciferol (VITAMIN D2) 1.25 MG (50000 UT) capsule Take 50,000 Units by mouth once a week. Saturday's    [provider]  fish oil-omega-3 fatty acids 1000 MG capsule Take 1 g by mouth daily.    [provider]  LYSINE PO Take 1,000 mg by mouth daily.    [provider]  Zinc 50 MG TABS Take 1 tablet by mouth daily.    [provider]     Allergies:    Allergies  Allergen Reactions   Atorvastatin Other (See Comments)    Myalgias    Compazine [Prochlorperazine] Other (See Comments)    Unknown, pt does not remember   Floxin [Ofloxacin] Other (See Comments)    Unknown, pt does not remember reaction but was allergic reaction   Livalo [Pitavastatin] Other (See Comments)    Leg pain   Pravastatin Sodium Other (See Comments)    mylgias    Simvastatin Other (See Comments)    Elevated lft's 06/2011   Fenofibrate Rash    Rash     Social History:   Social History   Socioeconomic History   Marital status: Widowed    Spouse name: Not on file   Number of children: Not on file   Years of education: Not on file   Highest education level: Not on file  Occupational History   Not on file  Tobacco Use   Smoking status: Never   Smokeless tobacco: Never  Vaping Use   Vaping Use: Never used  Substance and Sexual Activity   Alcohol use: No   Drug use: Never   Sexual activity: Not Currently    Birth control/protection: Post-menopausal  Other Topics Concern   Not on file  Social History Narrative   Not on file   Social Determinants of Health   Financial Resource Strain: Not on file  Food Insecurity: No Food Insecurity (12/30/2021)   Hunger Vital Sign    Worried About Running Out of Food in the Last Year: Never true    Ran Out of Food in the Last Year: Never true  Transportation Needs: No Transportation Needs (12/30/2021)   PRAPARE - Hydrologist (Medical): No    Lack of Transportation (Non-Medical): No   Physical Activity: Not on file  Stress: Not on file  Social Connections: Not on file  Intimate Partner Violence: Not At Risk (12/30/2021)   Humiliation, Afraid, Rape, and Kick questionnaire    Fear of Current or Ex-Partner: No  Emotionally Abused: No    Physically Abused: No    Sexually Abused: No    Family History:   The patient's family history includes Heart disease in her father and mother.    ROS:  Constitutional: Denied fever, chills, malaise, night sweats Eyes: Denied vision change or loss Ears/Nose/Mouth/Throat: Denied ear ache, sore throat, sinus pain Cardiovascular: see HPI Respiratory: see HPI  Gastrointestinal: Denied nausea, vomiting, abdominal pain, diarrhea Genital/Urinary: Denied dysuria, hematuria, urinary frequency/urgency Musculoskeletal: Denied muscle ache, joint pain, weakness Skin: Denied rash, wound Neuro: Denied headache, dizziness, syncope Psych: Denied history of depression/anxiety  Endocrine: Denied history of diabetes   Physical Exam/Data:   Vitals:   12/30/21 1530 12/30/21 1545 12/30/21 1600 12/30/21 1704  BP: 134/75 116/65 125/70 (!) 143/52  Pulse: 66 63 81 88  Resp: (!) '22 17 17 18  '$ Temp:    98.6 F (37 C)  TempSrc:    Oral  SpO2: 90% 94% 92% 93%  Weight:      Height:       No intake or output data in the 24 hours ending 12/30/21 1744    12/30/2021   11:49 AM 10/30/2021    2:27 PM 07/22/2021   12:09 PM  Last 3 Weights  Weight (lbs) 177 lb 0.5 oz 177 lb 178 lb  Weight (kg) 80.3 kg 80.287 kg 80.74 kg     Body mass index is 32.38 kg/m.  Vitals:  Vitals:   12/30/21 1600 12/30/21 1704  BP: 125/70 (!) 143/52  Pulse: 81 88  Resp: 17 18  Temp:  98.6 F (37 C)  SpO2: 92% 93%   General Appearance: In no apparent distress, laying in bed, nontoxic-appearing HEENT: Normocephalic, atraumatic.  Neck: Supple, trachea midline, no JVDs Cardiovascular: Irregularly irregular, normal S1-S2,  no murmur Respiratory: Resting breathing  unlabored, lungs sounds clear to auscultation bilaterally, no use of accessory muscles. On room air.  No wheezes, rales or rhonchi.   Gastrointestinal: Bowel sounds positive, abdomen soft, non-tender Extremities: Able to move all extremities in bed without difficulty, no edema Musculoskeletal: Normal muscle bulk and tone Skin: Intact, warm, dry. No rashes or petechiae noted in exposed areas.  Neurologic: Alert, oriented to person, place and time. Fluent speech, no cognitive deficit Psychiatric: Normal affect. Mood is appropriate.      EKG: EKG admission revealed A-fib with RVR 130, ST depressions noted of inferior and anterolateral leads  Relevant CV Studies:  Event monitor 11/17/21:  Patch Wear Time:  3 days and 11 hours (2023-10-30T18:52:16-0400 to 2023-11-03T06:39:22-0400)   Sinus rhythm. (Minimum HR of 49 bpm, max HR of 160 bpm, and avg HR of 62 bpm).  145 Supraventricular Tachycardia runs occurred, the longest lasting 21.9 seconds.  Premature atrial contractions (1.0%, 3077),  Rare premature ventricular contractions (<1.0%)   Echo 11/13/21:   1. Left ventricular ejection fraction, by estimation, is 55 to 60%. The  left ventricle has normal function. The left ventricle has no regional  wall motion abnormalities. Left ventricular diastolic parameters are  consistent with Grade II diastolic  dysfunction (pseudonormalization).   2. Right ventricular systolic function is normal. The right ventricular  size is normal. Tricuspid regurgitation signal is inadequate for assessing  PA pressure.   3. Left atrial size was moderately dilated.   4. The mitral valve is degenerative. No evidence of mitral valve  regurgitation.   5. S/p 26 mm Sapien Aortic Prosthesis. Date 07/27/2016. No PVL. AV max 2.1  m/s. DI 0.44. Mean gradient 10 mmHg (improved  from prior study 09/22/2018)  Normal Prosthesis. Aortic valve regurgitation is not visualized.   6. The inferior vena cava is normal in size  with greater than 50%  respiratory variability, suggesting right atrial pressure of 3 mmHg.   Comparison(s): EF 55%, mild LVH, TAVR mean 17, peak 29.6 mmHg.    R/L [facility] 07/18/15:   1. Mild non-obstructive CAD 2. Moderate aortic stenosis (peak to peak gradient 20 mmHg, AVA 1.1 cm2)   Recommendations: Medical management of CAD. Follow aortic stenosis with serial echocardiograms.    Laboratory Data:  High Sensitivity Troponin:   Recent Labs  Lab 12/30/21 1151 12/30/21 1427  TROPONINIHS 10 10      Chemistry Recent Labs  Lab 12/30/21 1151 12/30/21 1225  NA 139  --   K 3.5  --   CL 97*  --   CO2 29  --   GLUCOSE 114*  --   BUN 25*  --   CREATININE 1.17*  --   CALCIUM 9.6  --   MG  --  2.3  GFRNONAA 45*  --   ANIONGAP 13  --     No results for input(s): "PROT", "ALBUMIN", "AST", "ALT", "ALKPHOS", "BILITOT" in the last 168 hours. Lipids No results for input(s): "CHOL", "TRIG", "HDL", "LABVLDL", "LDLCALC", "CHOLHDL" in the last 168 hours. Hematology Recent Labs  Lab 12/30/21 1151  WBC 10.4  RBC 4.39  HGB 14.1  HCT 41.9  MCV 95.4  MCH 32.1  MCHC 33.7  RDW 13.7  PLT 215   Thyroid  Recent Labs  Lab 12/30/21 1225  TSH 3.975   BNPNo results for input(s): "BNP", "PROBNP" in the last 168 hours.  DDimer No results for input(s): "DDIMER" in the last 168 hours.   Radiology/Studies:  DG Chest Portable 1 View  Result Date: 12/30/2021 CLINICAL DATA:  Chest pain and atrial fibrillation EXAM: PORTABLE CHEST 1 VIEW COMPARISON:  Chest 08/06/2021 FINDINGS: Cardiac enlargement without heart failure. Postop TAVR in satisfactory position. Atherosclerotic calcification aortic arch Lungs clear without infiltrate or effusion or edema. IMPRESSION: No active disease. Electronically Signed   By: Franchot Gallo M.D.   On: 12/30/2021 12:35     Assessment and Plan:   A fib RVR, new diagnosis History of PVC, PAC, SVTs -Presented with resting left-sided chest pain today, Apple Watch  reports A-fib since Sunday, 12/28/2018 3:11 AM -High sensitive troponin negative x 2 -EKG and telemetry revealed A-fib with RVR, noted ST depression of inferior and anterolateral leads - CXR with no acute finding -TSH WNL -K3.5, mag 2.3 -Given cough and left axillary pain, will check RSV/flu/COVID/respiratory panel rule out infection, unlikely ACS - wean off Cardizem drip, uptitrate PTA Cardizem CD '120mg'$  to '180mg'$  daily - started Eliquis 5 mg twice daily, may stop ASA '81mg'$  daily to avoid risk of bleeding -Consider DCCV if lacking response, consider EP referral for ablation at the time of discharge  Nonobstructive CAD -High sensitive troponin negative x 2, left sided pain has resolved, unlikely ACS -Historically not tolerating statin -May stop aspirin given initiation of Eliquis -Not on beta-blocker due to intolerance per office telephone note 27/03/5007  Chronic diastolic heart failure History of aortic stenosis -Echo from 11/2021 stable, see report above -Clinically euvolemic, hold lasix for now  - GDMT: Not tolerant to beta-blocker, consider ARNI /MRA/ SLT2i if BP would allow , defer to outpatient cardiologist   Hypothyroidism -Will resume home dose levothyroxine, TSH WNL    Risk Assessment/Risk Scores:   New York Heart Association (  NYHA) Functional Class NYHA Class II  CHA2DS2-VASc Score = 6   This indicates a 9.7% annual risk of stroke. The patient's score is based upon: CHF History: 1 HTN History: 1 Diabetes History: 0 Stroke History: 0 Vascular Disease History: 1 Age Score: 2 Gender Score: 1    Severity of Illness: The appropriate patient status for this patient is OBSERVATION. Observation status is judged to be reasonable and necessary in order to provide the required intensity of service to ensure the patient's safety. The patient's presenting symptoms, physical exam findings, and initial radiographic and laboratory data in the context of their medical condition  is felt to place them at decreased risk for further clinical deterioration. Furthermore, it is anticipated that the patient will be medically stable for discharge from the hospital within 2 midnights of admission.    For questions or updates, please contact [place] Please consult [email or web address] for contact info under     Signed, Margie Billet, NP  12/30/2021 5:44 PM     I have personally seen and examined this patient. I agree with the assessment and plan as outlined above. Ms. Hartis is well known to me. I follow her as an outpatient. 88 yo female with history of CAD, HLD, SVT, breast cancer, chronic diastolic CHF, bilateral carotid artery disease and severe aortic valve stenosis s/p TAVR who presented to the [facility] ED with c/o dyspnea and "atrial fib" on Apple watch. Cardiac cath in July 2017 with mild plaque in the LAD. She underwent TAVR on 07/27/16 with placement of a 26 mm Edwards Sapien 3 bioprosthetic AVR from the right femoral artery. Post operative echo showed a normally functioning AVR. Echo November 2023 with normal LV function, normally functioning AVR. Recent cardiac monitor with SVT. She was started on Toprol but did not tolerate. She was changed to Cardizem  last week. Her Apple watch noted atrial fib several days ago. She came into the ED today and was in AF with RVR. She has been started on a Cardizem drip by the ED staff. HR is now 80-100 bpm. She has been started on Eliquis. Chest x-ray is ok. EKG reviewed by me and shows atrial fib. TSH is normal. My exam: alert and oriented, NAD. CV: irregular irregular. Pulm: lungs clear  Ext: no LE edema  Plan: new onset atrial fib with RVR: Rate now controlled on Cardizem drip. Will continue the drip tonight and continue the Eliquis. If she is rate controlled tomorrow, will change to a higher dose of po Cardizem (she had been on 120 mg at home). Stop ASA.   Lauree Chandler, MD, [facility] 12/30/2021  5:45 PM

## 2021-12-30 NOTE — ED Triage Notes (Signed)
Patient here POV from Home.  Endorses Left Sided CP and and Left Tingling that began this AM. Her Apple Watch has been alerting A. Fib. No Formal Diagnosis of Atrial Fib.    Some SOB as well for Months that worsened 3 Days ago.   NAD Noted during Triage. A&Ox4. GCS 15. BIB Wheelchair.

## 2021-12-30 NOTE — Telephone Encounter (Signed)
Patient c/o Palpitations:  High priority if patient c/o lightheadedness, shortness of breath, or chest pain  How long have you had palpitations/irregular HR/ Afib? Are you having the symptoms now?   No  Are you currently experiencing lightheadedness, SOB or CP?   No  Do you have a history of afib (atrial fibrillation) or irregular heart rhythm?   Yes  Have you checked your BP or HR? (document readings if available):  Yes  Are you experiencing any other symptoms? No   Daughter called concerned that the patient is now having pains in her shoulder and down her side.  Appointment scheduled on 12/29, 1:30 pm.

## 2021-12-31 ENCOUNTER — Telehealth (HOSPITAL_COMMUNITY): Payer: Self-pay | Admitting: Pharmacy Technician

## 2021-12-31 ENCOUNTER — Other Ambulatory Visit (HOSPITAL_COMMUNITY): Payer: Self-pay

## 2021-12-31 DIAGNOSIS — I25110 Atherosclerotic heart disease of native coronary artery with unstable angina pectoris: Secondary | ICD-10-CM | POA: Diagnosis not present

## 2021-12-31 DIAGNOSIS — Z853 Personal history of malignant neoplasm of breast: Secondary | ICD-10-CM | POA: Diagnosis not present

## 2021-12-31 DIAGNOSIS — Z79899 Other long term (current) drug therapy: Secondary | ICD-10-CM | POA: Diagnosis not present

## 2021-12-31 DIAGNOSIS — I2510 Atherosclerotic heart disease of native coronary artery without angina pectoris: Secondary | ICD-10-CM | POA: Diagnosis present

## 2021-12-31 DIAGNOSIS — N1831 Chronic kidney disease, stage 3a: Secondary | ICD-10-CM | POA: Diagnosis present

## 2021-12-31 DIAGNOSIS — I480 Paroxysmal atrial fibrillation: Secondary | ICD-10-CM | POA: Diagnosis present

## 2021-12-31 DIAGNOSIS — Z79890 Hormone replacement therapy (postmenopausal): Secondary | ICD-10-CM | POA: Diagnosis not present

## 2021-12-31 DIAGNOSIS — I088 Other rheumatic multiple valve diseases: Secondary | ICD-10-CM | POA: Diagnosis not present

## 2021-12-31 DIAGNOSIS — N189 Chronic kidney disease, unspecified: Secondary | ICD-10-CM | POA: Diagnosis not present

## 2021-12-31 DIAGNOSIS — Z953 Presence of xenogenic heart valve: Secondary | ICD-10-CM | POA: Diagnosis not present

## 2021-12-31 DIAGNOSIS — Z1152 Encounter for screening for COVID-19: Secondary | ICD-10-CM | POA: Diagnosis not present

## 2021-12-31 DIAGNOSIS — I739 Peripheral vascular disease, unspecified: Secondary | ICD-10-CM | POA: Diagnosis not present

## 2021-12-31 DIAGNOSIS — E1122 Type 2 diabetes mellitus with diabetic chronic kidney disease: Secondary | ICD-10-CM | POA: Diagnosis present

## 2021-12-31 DIAGNOSIS — I340 Nonrheumatic mitral (valve) insufficiency: Secondary | ICD-10-CM | POA: Diagnosis not present

## 2021-12-31 DIAGNOSIS — Z96651 Presence of right artificial knee joint: Secondary | ICD-10-CM | POA: Diagnosis present

## 2021-12-31 DIAGNOSIS — Z96643 Presence of artificial hip joint, bilateral: Secondary | ICD-10-CM | POA: Diagnosis present

## 2021-12-31 DIAGNOSIS — I130 Hypertensive heart and chronic kidney disease with heart failure and stage 1 through stage 4 chronic kidney disease, or unspecified chronic kidney disease: Secondary | ICD-10-CM | POA: Diagnosis present

## 2021-12-31 DIAGNOSIS — E039 Hypothyroidism, unspecified: Secondary | ICD-10-CM | POA: Diagnosis present

## 2021-12-31 DIAGNOSIS — M1990 Unspecified osteoarthritis, unspecified site: Secondary | ICD-10-CM | POA: Diagnosis present

## 2021-12-31 DIAGNOSIS — Z7901 Long term (current) use of anticoagulants: Secondary | ICD-10-CM | POA: Diagnosis not present

## 2021-12-31 DIAGNOSIS — Z888 Allergy status to other drugs, medicaments and biological substances status: Secondary | ICD-10-CM | POA: Diagnosis not present

## 2021-12-31 DIAGNOSIS — I700 Atherosclerosis of aorta: Secondary | ICD-10-CM | POA: Diagnosis present

## 2021-12-31 DIAGNOSIS — E785 Hyperlipidemia, unspecified: Secondary | ICD-10-CM | POA: Diagnosis present

## 2021-12-31 DIAGNOSIS — Z9011 Acquired absence of right breast and nipple: Secondary | ICD-10-CM | POA: Diagnosis not present

## 2021-12-31 DIAGNOSIS — I5032 Chronic diastolic (congestive) heart failure: Secondary | ICD-10-CM | POA: Diagnosis present

## 2021-12-31 DIAGNOSIS — E1151 Type 2 diabetes mellitus with diabetic peripheral angiopathy without gangrene: Secondary | ICD-10-CM | POA: Diagnosis present

## 2021-12-31 DIAGNOSIS — Z7982 Long term (current) use of aspirin: Secondary | ICD-10-CM | POA: Diagnosis not present

## 2021-12-31 DIAGNOSIS — I083 Combined rheumatic disorders of mitral, aortic and tricuspid valves: Secondary | ICD-10-CM | POA: Diagnosis not present

## 2021-12-31 DIAGNOSIS — I4891 Unspecified atrial fibrillation: Secondary | ICD-10-CM | POA: Diagnosis not present

## 2021-12-31 DIAGNOSIS — R079 Chest pain, unspecified: Secondary | ICD-10-CM | POA: Diagnosis present

## 2021-12-31 DIAGNOSIS — M810 Age-related osteoporosis without current pathological fracture: Secondary | ICD-10-CM | POA: Diagnosis present

## 2021-12-31 DIAGNOSIS — Z8249 Family history of ischemic heart disease and other diseases of the circulatory system: Secondary | ICD-10-CM | POA: Diagnosis not present

## 2021-12-31 LAB — BASIC METABOLIC PANEL
Anion gap: 11 (ref 5–15)
BUN: 20 mg/dL (ref 8–23)
CO2: 26 mmol/L (ref 22–32)
Calcium: 9.2 mg/dL (ref 8.9–10.3)
Chloride: 101 mmol/L (ref 98–111)
Creatinine, Ser: 1.02 mg/dL — ABNORMAL HIGH (ref 0.44–1.00)
GFR, Estimated: 53 mL/min — ABNORMAL LOW (ref >60)
Glucose, Bld: 121 mg/dL — ABNORMAL HIGH (ref 70–99)
Potassium: 4 mmol/L (ref 3.5–5.1)
Sodium: 138 mmol/L (ref 135–145)

## 2021-12-31 LAB — CBC
HCT: 40.5 % (ref 36.0–46.0)
Hemoglobin: 13.3 g/dL (ref 12.0–15.0)
MCH: 31.8 pg (ref 26.0–34.0)
MCHC: 32.8 g/dL (ref 30.0–36.0)
MCV: 96.9 fL (ref 80.0–100.0)
Platelets: 206 10*3/uL (ref 150–400)
RBC: 4.18 MIL/uL (ref 3.87–5.11)
RDW: 13.9 % (ref 11.5–15.5)
WBC: 10.4 10*3/uL (ref 4.0–10.5)
nRBC: 0 % (ref 0.0–0.2)

## 2021-12-31 LAB — MAGNESIUM: Magnesium: 2.3 mg/dL (ref 1.7–2.4)

## 2021-12-31 LAB — BRAIN NATRIURETIC PEPTIDE: B Natriuretic Peptide: 338.8 pg/mL — ABNORMAL HIGH (ref 0.0–100.0)

## 2021-12-31 MED ORDER — FUROSEMIDE 10 MG/ML IJ SOLN
40.0000 mg | Freq: Once | INTRAMUSCULAR | Status: AC
  Administered 2021-12-31: 40 mg via INTRAVENOUS
  Filled 2021-12-31: qty 4

## 2021-12-31 MED ORDER — POTASSIUM CHLORIDE CRYS ER 20 MEQ PO TBCR
40.0000 meq | EXTENDED_RELEASE_TABLET | Freq: Once | ORAL | Status: AC
  Administered 2021-12-31: 40 meq via ORAL
  Filled 2021-12-31: qty 2

## 2021-12-31 NOTE — TOC Benefit Eligibility Note (Signed)
Patient Teacher, English as a foreign language completed.    The patient is currently admitted and upon discharge could be taking Xarelto 20 mg.  The current 30 day co-pay is $45.00.   The patient is currently admitted and upon discharge could be taking Eliquis 5 mg.  Non Preferred  The patient is insured through [place], [place] Patient Advocate Specialist [place] Patient Advocate Team Direct Number: [phone]  Fax: [phone]

## 2021-12-31 NOTE — Progress Notes (Addendum)
Rounding Note    Patient Name: [name] Date of Encounter: 12/31/2021  [place] Cardiologist: Lauree Chandler, MD   Subjective   Still short of breath this morning. HR still in the 130s with minimal activity.   Inpatient Medications    Scheduled Meds:  apixaban  5 mg Oral BID   cyanocobalamin  1,000 mcg Oral Daily   furosemide  40 mg Oral Daily   levothyroxine  112 mcg Oral QAC breakfast   loratadine  10 mg Oral Daily   omega-3 acid ethyl esters  1 g Oral Daily   Continuous Infusions:  diltiazem (CARDIZEM) infusion 7.5 mg/hr (12/31/21 0019)   PRN Meds: acetaminophen   Vital Signs    Vitals:   12/30/21 1600 12/30/21 1704 12/30/21 2001 12/31/21 0519  BP: 125/70 (!) 143/52 (!) 119/58 105/66  Pulse: 81 88 88 (!) 103  Resp: '17 18 19 20  '$ Temp:  98.6 F (37 C) 99.4 F (37.4 C) 98.6 F (37 C)  TempSrc:  Oral Oral Oral  SpO2: 92% 93% 92% 90%  Weight:    77.4 kg  Height:        Intake/Output Summary (Last 24 hours) at 12/31/2021 0759 Last data filed at 12/30/2021 2230 Gross per 24 hour  Intake --  Output 300 ml  Net -300 ml      12/31/2021    5:19 AM 12/30/2021   11:49 AM 10/30/2021    2:27 PM  Last 3 Weights  Weight (lbs) 170 lb 11.2 oz 177 lb 0.5 oz 177 lb  Weight (kg) 77.429 kg 80.3 kg 80.287 kg      Telemetry    Atrial fibrillation with RVR rates 90-130s - Personally Reviewed  ECG    No new tracing   Physical Exam   GEN: No acute distress.   Neck: + JVD Cardiac: Irreg Irreg, no murmurs, rubs, or gallops.  Respiratory: Diminished in bases bilaterally GI: Soft, nontender, non-distended  MS: No edema; No deformity. Neuro:  Nonfocal  Psych: Normal affect   Labs    High Sensitivity Troponin:   Recent Labs  Lab 12/30/21 1151 12/30/21 1427  TROPONINIHS 10 10     Chemistry Recent Labs  Lab 12/30/21 1151 12/30/21 1225 12/31/21 0335  NA 139  --  138  K 3.5  --  4.0  CL 97*  --  101  CO2 29  --  26   GLUCOSE 114*  --  121*  BUN 25*  --  20  CREATININE 1.17*  --  1.02*  CALCIUM 9.6  --  9.2  MG  --  2.3  --   GFRNONAA 45*  --  53*  ANIONGAP 13  --  11    Lipids No results for input(s): "CHOL", "TRIG", "HDL", "LABVLDL", "LDLCALC", "CHOLHDL" in the last 168 hours.  Hematology Recent Labs  Lab 12/30/21 1151 12/31/21 0335  WBC 10.4 10.4  RBC 4.39 4.18  HGB 14.1 13.3  HCT 41.9 40.5  MCV 95.4 96.9  MCH 32.1 31.8  MCHC 33.7 32.8  RDW 13.7 13.9  PLT 215 206   Thyroid  Recent Labs  Lab 12/30/21 1225  TSH 3.975    BNPNo results for input(s): "BNP", "PROBNP" in the last 168 hours.  DDimer No results for input(s): "DDIMER" in the last 168 hours.   Radiology    DG Chest Portable 1 View  Result Date: 12/30/2021 CLINICAL DATA:  Chest pain and atrial fibrillation EXAM: PORTABLE CHEST 1 VIEW  COMPARISON:  Chest 08/06/2021 FINDINGS: Cardiac enlargement without heart failure. Postop TAVR in satisfactory position. Atherosclerotic calcification aortic arch Lungs clear without infiltrate or effusion or edema. IMPRESSION: No active disease. Electronically Signed   By: Franchot Gallo M.D.   On: 12/30/2021 12:35    Cardiac Studies   Echo: 11/2021  IMPRESSIONS     1. Left ventricular ejection fraction, by estimation, is 55 to 60%. The  left ventricle has normal function. The left ventricle has no regional  wall motion abnormalities. Left ventricular diastolic parameters are  consistent with Grade II diastolic  dysfunction (pseudonormalization).   2. Right ventricular systolic function is normal. The right ventricular  size is normal. Tricuspid regurgitation signal is inadequate for assessing  PA pressure.   3. Left atrial size was moderately dilated.   4. The mitral valve is degenerative. No evidence of mitral valve  regurgitation.   5. S/p 26 mm Sapien Aortic Prosthesis. Date 07/27/2016. No PVL. AV max 2.1  m/s. DI 0.44. Mean gradient 10 mmHg (improved from prior study 09/22/2018)   Normal Prosthesis. Aortic valve regurgitation is not visualized.   6. The inferior vena cava is normal in size with greater than 50%  respiratory variability, suggesting right atrial pressure of 3 mmHg.   Comparison(s): EF 55%, mild LVH, TAVR mean 17, peak 29.6 mmHg.   FINDINGS   Left Ventricle: Left ventricular ejection fraction, by estimation, is 55  to 60%. The left ventricle has normal function. The left ventricle has no  regional wall motion abnormalities. The left ventricular internal cavity  size was normal in size. There is   no left ventricular hypertrophy. Left ventricular diastolic parameters  are consistent with Grade II diastolic dysfunction (pseudonormalization).   Right Ventricle: The right ventricular size is normal. Right ventricular  systolic function is normal. Tricuspid regurgitation signal is inadequate  for assessing PA pressure.   Left Atrium: Left atrial size was moderately dilated.   Right Atrium: Right atrial size was normal in size.   Pericardium: There is no evidence of pericardial effusion.   Mitral Valve: The mitral valve is degenerative in appearance. No evidence  of mitral valve regurgitation.   Tricuspid Valve: Tricuspid valve regurgitation is trivial.   Aortic Valve: S/p 26 mm Sapien Aortic Prosthesis. Date 07/27/2016. No PVL.  AV max 2.1 m/s. DI 0.44. Mean gradient 10 mmHg (improved from prior study  09/22/2018) Normal Prosthesis. Aortic valve regurgitation is not  visualized. Aortic valve mean gradient  measures 10.0 mmHg. Aortic valve peak gradient measures 17.9 mmHg. Aortic  valve area, by VTI measures 1.26 cm.   Pulmonic Valve: Pulmonic valve regurgitation is not visualized.   Aorta: The aortic root and ascending aorta are structurally normal, with  no evidence of dilitation.   Venous: The inferior vena cava is normal in size with greater than 50%  respiratory variability, suggesting right atrial pressure of 3 mmHg.   IAS/Shunts: No  atrial level shunt detected by color flow Doppler.    Patient Profile     88 y.o. female with non-obstructive CAD, aortic stenosis s/p TAVR 07/27/16, SVT, chronic diastolic heart failure,  CKD, HLD, hypothyroidism, breast cancer s/p lumpectomy and chemoradiation, cartoid artery disease, who is being seen 12/30/2021 for the evaluation of A fib RVR.   Assessment & Plan    A fib RVR, new diagnosis History of PVC, PAC, SVTs -- Presented with resting left-sided chest pain today, Apple Watch reported A-fib since Sunday, 12/28/2018 3:11 AM -- EKG and telemetry revealed  A-fib with RVR, noted ST depression of inferior and anterolateral leads -- CXR with no acute finding -- TSH WNL -- respiratory panel negative  -- rates remain elevated with minimal activity, will continue IV Cardizem today. Placed for TEE/DCCV tomorrow in the event rates remain uncontrolled.  -- continue Eliquis '5mg'$  BID  Shared Decision Making/Informed Consent The risks [stroke, cardiac arrhythmias rarely resulting in the need for a temporary or permanent pacemaker, skin irritation or burns, esophageal damage, perforation (1:10,000 risk), bleeding, pharyngeal hematoma as well as other potential complications associated with conscious sedation including aspiration, arrhythmia, respiratory failure and death], benefits (treatment guidance, restoration of normal sinus rhythm, diagnostic support) and alternatives of a transesophageal echocardiogram guided cardioversion were discussed in detail with Ms. Febo and she is willing to proceed.  Nonobstructive CAD -- High sensitive troponin negative x 2, left sided pain has resolved, unlikely ACS -- statin intolerance, ASA stopped with need for Eliquis  -- Not on beta-blocker due to intolerance per office telephone note 12/24/2021   Chronic diastolic heart failure History of aortic stenosis -- Echo from 11/2021 stable, see report above -- remains dyspneic this morning, check BNP -- IV  lasix '40mg'$  x1 in the setting of JVD   Hypothyroidism -- continue levothyroxine, TSH WNL  For questions or updates, please contact [place] Please consult [email or web address] for contact info under        Signed, Reino Bellis, NP  12/31/2021, 7:59 AM    I have personally seen and examined this patient. I agree with the assessment and plan as outlined above.  Heart rates in the 90s while in bed on cardizem drip but up to 130s when walking. Will continue Cardizem drip today. If she does not convert overnight to sinus, will plan TEE guided DCCV tomorrow. NPO at midnight.  Continue Eliquis.   Lauree Chandler, MD, [facility] 12/31/2021 9:29 AM

## 2021-12-31 NOTE — Telephone Encounter (Signed)
Patient Advocate Encounter  Prior Authorization for Eliquis '5MG'$  tablets has been approved.    PA# [id number] Effective dates: 12/31/2021 through 01/04/2023  Patients co-pay is $299.48.     Lyndel Safe, [place] Patient Advocate Specialist [place] Patient Advocate Team Direct Number: [phone]  Fax: [phone]

## 2021-12-31 NOTE — Progress Notes (Signed)
Text page MD to see if can discontinue Airborne/Contact order. Resp Panel, Covid, and Flu all are negative. Awaiting response.

## 2021-12-31 NOTE — Progress Notes (Signed)
Mobility Specialist - Progress Note   12/31/21 0946  Mobility  Activity Ambulated with assistance to bathroom  Level of Assistance Minimal assist, patient does 75% or more  Assistive Device Front wheel walker  Distance Ambulated (ft) 20 ft  Activity Response Tolerated well  Mobility Referral Yes  $Mobility charge 1 Mobility    During mobility:153 HR Post-mobility:104 HR  Pt was received in bed and agreeable. Pt was MinA to stand from bed. No complaints throughout. Pt was left in bed with all needs met.  Franki Monte  Mobility Specialist Please contact via Solicitor or Rehab office at [phone]

## 2021-12-31 NOTE — Telephone Encounter (Signed)
Patient Advocate Encounter   Received notification that prior authorization for Eliquis '5MG'$  tablets is required.   PA submitted on 12/31/2021 Key [id number] Status is pending       Lyndel Safe, [place] Patient Advocate Specialist [place] Patient Advocate Team Direct Number: [phone]  Fax: [phone]

## 2021-12-31 NOTE — Progress Notes (Signed)
Mobility Specialist - Progress Note   12/31/21 1314  Mobility  Activity Ambulated with assistance in room  Level of Assistance Minimal assist, patient does 75% or more  Assistive Device Front wheel walker  Distance Ambulated (ft) 20 ft  Activity Response Tolerated well  Mobility Referral Yes  $Mobility charge 1 Mobility   Pt received in chair and needing assistance to BR. Pt was MinA throughout. Pt with successful void. Pt returned to bed with all needs met.   [name]  Mobility Specialist Please contact via Solicitor or Rehab office at [phone]

## 2021-12-31 NOTE — Care Management (Signed)
  Transition of Care [facility]) Screening Note   Patient Details  Name: ZOIE SARIN Date of Birth: [birth year]   Transition of [facility]) CM/SW Contact:    Bethena Roys, RN Phone Number: 12/31/2021, 12:44 PM    Transition of Care Department [facility]) has reviewed the patient and no TOC needs have been identified at this time. We will continue to monitor patient advancement through interdisciplinary progression rounds. If new patient transition needs arise, please place a TOC consult.

## 2022-01-01 ENCOUNTER — Inpatient Hospital Stay (HOSPITAL_COMMUNITY): Payer: Medicare Other | Admitting: Certified Registered Nurse Anesthetist

## 2022-01-01 ENCOUNTER — Encounter (HOSPITAL_COMMUNITY)
Admission: EM | Disposition: A | Discharge status: Home / Self Care | Payer: Self-pay | Source: Home / Self Care | Attending: Cardiovascular Disease

## 2022-01-01 ENCOUNTER — Ambulatory Visit: Payer: Medicare Other | Admitting: Nurse Practitioner

## 2022-01-01 ENCOUNTER — Inpatient Hospital Stay (HOSPITAL_COMMUNITY): Payer: Medicare Other

## 2022-01-01 ENCOUNTER — Encounter (HOSPITAL_COMMUNITY): Payer: Self-pay | Admitting: Cardiovascular Disease

## 2022-01-01 DIAGNOSIS — I340 Nonrheumatic mitral (valve) insufficiency: Secondary | ICD-10-CM | POA: Diagnosis not present

## 2022-01-01 DIAGNOSIS — I739 Peripheral vascular disease, unspecified: Secondary | ICD-10-CM | POA: Diagnosis not present

## 2022-01-01 DIAGNOSIS — I4891 Unspecified atrial fibrillation: Secondary | ICD-10-CM | POA: Diagnosis not present

## 2022-01-01 DIAGNOSIS — E039 Hypothyroidism, unspecified: Secondary | ICD-10-CM

## 2022-01-01 DIAGNOSIS — I25110 Atherosclerotic heart disease of native coronary artery with unstable angina pectoris: Secondary | ICD-10-CM

## 2022-01-01 DIAGNOSIS — I088 Other rheumatic multiple valve diseases: Secondary | ICD-10-CM | POA: Diagnosis not present

## 2022-01-01 HISTORY — PX: CARDIOVERSION: SHX1299

## 2022-01-01 HISTORY — PX: TEE WITHOUT CARDIOVERSION: SHX5443

## 2022-01-01 LAB — BASIC METABOLIC PANEL
Anion gap: 8 (ref 5–15)
BUN: 20 mg/dL (ref 8–23)
CO2: 26 mmol/L (ref 22–32)
Calcium: 9 mg/dL (ref 8.9–10.3)
Chloride: 100 mmol/L (ref 98–111)
Creatinine, Ser: 1.06 mg/dL — ABNORMAL HIGH (ref 0.44–1.00)
GFR, Estimated: 51 mL/min — ABNORMAL LOW (ref >60)
Glucose, Bld: 128 mg/dL — ABNORMAL HIGH (ref 70–99)
Potassium: 4 mmol/L (ref 3.5–5.1)
Sodium: 134 mmol/L — ABNORMAL LOW (ref 135–145)

## 2022-01-01 LAB — PROTIME-INR
INR: 1.8 — ABNORMAL HIGH (ref 0.8–1.2)
Prothrombin Time: 20.8 seconds — ABNORMAL HIGH (ref 11.4–15.2)

## 2022-01-01 LAB — ECHO TEE
AV Mean grad: 6 mmHg
AV Peak grad: 10 mmHg
Ao pk vel: 1.58 m/s

## 2022-01-01 SURGERY — ECHOCARDIOGRAM, TRANSESOPHAGEAL
Anesthesia: Monitor Anesthesia Care

## 2022-01-01 MED ORDER — PROPOFOL 500 MG/50ML IV EMUL
INTRAVENOUS | Status: DC | PRN
  Administered 2022-01-01: 75 ug/kg/min via INTRAVENOUS

## 2022-01-01 MED ORDER — SODIUM CHLORIDE 0.9 % IV SOLN: INTRAVENOUS | Status: DC

## 2022-01-01 MED ORDER — DILTIAZEM HCL ER COATED BEADS 180 MG PO CP24
180.0000 mg | ORAL_CAPSULE | Freq: Every day | ORAL | Status: DC
  Administered 2022-01-01 – 2022-01-02 (×2): 180 mg via ORAL
  Filled 2022-01-01 (×2): qty 1

## 2022-01-01 MED ORDER — LIDOCAINE 2% (20 MG/ML) 5 ML SYRINGE
INTRAMUSCULAR | Status: DC | PRN
  Administered 2022-01-01: 60 mg via INTRAVENOUS

## 2022-01-01 MED ORDER — PROPOFOL 10 MG/ML IV BOLUS
INTRAVENOUS | Status: DC | PRN
  Administered 2022-01-01: 20 mg via INTRAVENOUS
  Administered 2022-01-01: 15 mg via INTRAVENOUS

## 2022-01-01 NOTE — Anesthesia Procedure Notes (Signed)
Procedure Name: MAC Date/Time: 01/01/2022 12:33 PM  Performed by: Colin Benton, CRNAPre-anesthesia Checklist: Patient identified, Emergency Drugs available, Suction available and Patient being monitored Patient Re-evaluated:Patient Re-evaluated prior to induction Oxygen Delivery Method: Nasal cannula Induction Type: IV induction Airway Equipment and Method: Bite block Placement Confirmation: positive ETCO2 Dental Injury: Teeth and Oropharynx as per pre-operative assessment

## 2022-01-01 NOTE — Progress Notes (Signed)
Rounding Note    Patient Name: [name] Date of Encounter: 01/01/2022  [place] Cardiologist: Lauree Chandler, MD   Subjective   Mild dyspnea. HR controlled when in bed but elevated when she ambulates.   Inpatient Medications    Scheduled Meds:  apixaban  5 mg Oral BID   cyanocobalamin  1,000 mcg Oral Daily   levothyroxine  112 mcg Oral QAC breakfast   loratadine  10 mg Oral Daily   omega-3 acid ethyl esters  1 g Oral Daily   Continuous Infusions:  diltiazem (CARDIZEM) infusion 7.5 mg/hr (01/01/22 0915)   PRN Meds: acetaminophen   Vital Signs    Vitals:   12/31/21 1521 12/31/21 2331 01/01/22 0415 01/01/22 0500  BP:  131/62 103/66   Pulse: 74 90 61   Resp: '18 20 18   '$ Temp: 99.5 F (37.5 C) 98.4 F (36.9 C) 98.6 F (37 C)   TempSrc: Oral Oral Oral   SpO2: 91%     Weight:   77.6 kg 77.6 kg  Height:        Intake/Output Summary (Last 24 hours) at 01/01/2022 0918 Last data filed at 12/31/2021 1855 Gross per 24 hour  Intake 936.5 ml  Output --  Net 936.5 ml      01/01/2022    5:00 AM 01/01/2022    4:15 AM 12/31/2021    5:19 AM  Last 3 Weights  Weight (lbs) 171 lb 171 lb 170 lb 11.2 oz  Weight (kg) 77.565 kg 77.565 kg 77.429 kg      Telemetry    Atrial fibrillation rates 80-130s - Personally Reviewed  ECG    No new tracing   Physical Exam   General: Well developed, well nourished, NAD  HEENT: OP clear, mucus membranes moist  SKIN: warm, dry. No rashes. Neuro: No focal deficits  Musculoskeletal: Muscle strength 5/5 all ext  Psychiatric: Mood and affect normal  Neck: No JVD Lungs:Clear bilaterally, no wheezes, rhonci, crackles Cardiovascular: Irreg irreg. Soft systolic murmur.  Abdomen:Soft Extremities: No lower extremity edema.   Labs    High Sensitivity Troponin:   Recent Labs  Lab 12/30/21 1151 12/30/21 1427  TROPONINIHS 10 10     Chemistry Recent Labs  Lab 12/30/21 1151 12/30/21 1225  12/31/21 0335 01/01/22 0456  NA 139  --  138 134*  K 3.5  --  4.0 4.0  CL 97*  --  101 100  CO2 29  --  26 26  GLUCOSE 114*  --  121* 128*  BUN 25*  --  20 20  CREATININE 1.17*  --  1.02* 1.06*  CALCIUM 9.6  --  9.2 9.0  MG  --  2.3 2.3  --   GFRNONAA 45*  --  53* 51*  ANIONGAP 13  --  11 8    Lipids No results for input(s): "CHOL", "TRIG", "HDL", "LABVLDL", "LDLCALC", "CHOLHDL" in the last 168 hours.  Hematology Recent Labs  Lab 12/30/21 1151 12/31/21 0335  WBC 10.4 10.4  RBC 4.39 4.18  HGB 14.1 13.3  HCT 41.9 40.5  MCV 95.4 96.9  MCH 32.1 31.8  MCHC 33.7 32.8  RDW 13.7 13.9  PLT 215 206   Thyroid  Recent Labs  Lab 12/30/21 1225  TSH 3.975    BNP Recent Labs  Lab 12/31/21 0335  BNP 338.8*    DDimer No results for input(s): "DDIMER" in the last 168 hours.   Radiology    DG Chest Portable 1  View  Result Date: 12/30/2021 CLINICAL DATA:  Chest pain and atrial fibrillation EXAM: PORTABLE CHEST 1 VIEW COMPARISON:  Chest 08/06/2021 FINDINGS: Cardiac enlargement without heart failure. Postop TAVR in satisfactory position. Atherosclerotic calcification aortic arch Lungs clear without infiltrate or effusion or edema. IMPRESSION: No active disease. Electronically Signed   By: Franchot Gallo M.D.   On: 12/30/2021 12:35    Cardiac Studies   Echo: 11/2021  IMPRESSIONS     1. Left ventricular ejection fraction, by estimation, is 55 to 60%. The  left ventricle has normal function. The left ventricle has no regional  wall motion abnormalities. Left ventricular diastolic parameters are  consistent with Grade II diastolic  dysfunction (pseudonormalization).   2. Right ventricular systolic function is normal. The right ventricular  size is normal. Tricuspid regurgitation signal is inadequate for assessing  PA pressure.   3. Left atrial size was moderately dilated.   4. The mitral valve is degenerative. No evidence of mitral valve  regurgitation.   5. S/p 26 mm  Sapien Aortic Prosthesis. Date 07/27/2016. No PVL. AV max 2.1  m/s. DI 0.44. Mean gradient 10 mmHg (improved from prior study 09/22/2018)  Normal Prosthesis. Aortic valve regurgitation is not visualized.   6. The inferior vena cava is normal in size with greater than 50%  respiratory variability, suggesting right atrial pressure of 3 mmHg.   Comparison(s): EF 55%, mild LVH, TAVR mean 17, peak 29.6 mmHg.   FINDINGS   Left Ventricle: Left ventricular ejection fraction, by estimation, is 55  to 60%. The left ventricle has normal function. The left ventricle has no  regional wall motion abnormalities. The left ventricular internal cavity  size was normal in size. There is   no left ventricular hypertrophy. Left ventricular diastolic parameters  are consistent with Grade II diastolic dysfunction (pseudonormalization).   Right Ventricle: The right ventricular size is normal. Right ventricular  systolic function is normal. Tricuspid regurgitation signal is inadequate  for assessing PA pressure.   Left Atrium: Left atrial size was moderately dilated.   Right Atrium: Right atrial size was normal in size.   Pericardium: There is no evidence of pericardial effusion.   Mitral Valve: The mitral valve is degenerative in appearance. No evidence  of mitral valve regurgitation.   Tricuspid Valve: Tricuspid valve regurgitation is trivial.   Aortic Valve: S/p 26 mm Sapien Aortic Prosthesis. Date 07/27/2016. No PVL.  AV max 2.1 m/s. DI 0.44. Mean gradient 10 mmHg (improved from prior study  09/22/2018) Normal Prosthesis. Aortic valve regurgitation is not  visualized. Aortic valve mean gradient  measures 10.0 mmHg. Aortic valve peak gradient measures 17.9 mmHg. Aortic  valve area, by VTI measures 1.26 cm.   Pulmonic Valve: Pulmonic valve regurgitation is not visualized.   Aorta: The aortic root and ascending aorta are structurally normal, with  no evidence of dilitation.   Venous: The inferior  vena cava is normal in size with greater than 50%  respiratory variability, suggesting right atrial pressure of 3 mmHg.   IAS/Shunts: No atrial level shunt detected by color flow Doppler.    Patient Profile     88 y.o. female with non-obstructive CAD, aortic stenosis s/p TAVR 07/27/16, SVT, chronic diastolic heart failure,  CKD, HLD, hypothyroidism, breast cancer s/p lumpectomy and chemoradiation, cartoid artery disease, who is being seen 12/30/2021 for the evaluation of A fib RVR.   Assessment & Plan    New atrial fibrillation with RVR: Heart rate controlled at rest but elevated with ambulation.  She remains on a Cardizem drip. She has been started on Eliquis. Plans in place for TEE guided cardioversion today. She is NPO. The procedure has been reviewed with the patient.   Nonobstructive CAD without angina: High sensitive troponin negative x 2, left sided pain has resolved.  She did not tolerate beta  blockers. ASA stopped when Eliquis was started. She is statin intolerant.    Chronic diastolic heart failure: Volume status ok on exam.   History of aortic stenosis s/p TAVR: AVR working well by echo November 2023.   Hypothyroidism: continue levothyroxine, TSH WNL  For questions or updates, please contact [place] Please consult [email or web address] for contact info under     Lauree Chandler, MD, [facility] 01/01/2022 9:18 AM

## 2022-01-01 NOTE — Progress Notes (Signed)
  Echocardiogram 2D Echocardiogram has been performed.  [name] 01/01/2022, 12:59 PM

## 2022-01-01 NOTE — Discharge Instructions (Signed)

## 2022-01-01 NOTE — CV Procedure (Signed)
Procedure: Electrical Cardioversion Indications:  Atrial Fibrillation  Procedure Details:  Consent: Risks of procedure as well as the alternatives and risks of each were explained to the (patient/caregiver).  Consent for procedure obtained.  Time Out: Verified patient identification, verified procedure, site/side was marked, verified correct patient position, special equipment/implants available, medications/allergies/relevent history reviewed, required imaging and test results available. PERFORMED.  Patient placed on cardiac monitor, pulse oximetry, supplemental oxygen as necessary.  Sedation given:  Propofol '140mg'$  Pacer pads placed anterior and posterior chest.  Cardioverted 1 time(s).  Cardioversion with synchronized biphasic 150J shock.  Evaluation: Findings: Post procedure EKG shows: NSR Complications: None Patient did tolerate procedure well.  Time Spent Directly with the Patient:  6mnutes   HFreada Bergeron12/29/2023, 12:51 PM

## 2022-01-01 NOTE — Anesthesia Preprocedure Evaluation (Addendum)
Anesthesia Evaluation  Patient identified by MRN, date of birth, ID band Patient awake    Reviewed: Allergy & Precautions, NPO status , Patient's Chart, lab work & pertinent test results  History of Anesthesia Complications Negative for: history of anesthetic complications  Airway Mallampati: II   Neck ROM: Full    Dental no notable dental hx. (+) Dental Advisory Given   Pulmonary neg pulmonary ROS   Pulmonary exam normal        Cardiovascular + Peripheral Vascular Disease  + dysrhythmias Atrial Fibrillation and Supra Ventricular Tachycardia  Rhythm:Irregular Rate:Tachycardia   S/p AVR  '23 TTE - EF 55 to 60%. Grade II diastolic dysfunction (pseudonormalization). Left atrial size was moderately dilated. S/p 26 mm Sapien Aortic Prosthesis. Date 07/27/2016. No PVL. AV max 2.1 m/s. DI 0.44. Mean gradient 10 mmHg (improved from prior study 09/22/2018) Normal Prosthesis. Aortic valve regurgitation is not visualized.     Neuro/Psych negative neurological ROS  negative psych ROS   GI/Hepatic Neg liver ROS, PUD,,,  Endo/Other  Hypothyroidism    Renal/GU CRFRenal disease     Musculoskeletal  (+) Arthritis ,    Abdominal   Peds  Hematology negative hematology ROS (+)   Anesthesia Other Findings   Reproductive/Obstetrics  Breast cancer                              Anesthesia Physical Anesthesia Plan  ASA: 3  Anesthesia Plan: MAC   Post-op Pain Management: Minimal or no pain anticipated   Induction: Intravenous  PONV Risk Score and Plan: 3 and Treatment may vary due to age or medical condition and Propofol infusion  Airway Management Planned: Natural Airway and Nasal Cannula  Additional Equipment: None  Intra-op Plan:   Post-operative Plan:   Informed Consent: I have reviewed the patients History and Physical, chart, labs and discussed the procedure including the risks, benefits and  alternatives for the proposed anesthesia with the patient or authorized representative who has indicated his/her understanding and acceptance.     Dental advisory given  Plan Discussed with: CRNA and Anesthesiologist  Anesthesia Plan Comments: (May begin procedure as MAC with conversion to GA as indicated by procedure )        Anesthesia Quick Evaluation

## 2022-01-01 NOTE — H&P (View-Only) (Signed)
Rounding Note    Patient Name: [name] Date of Encounter: 01/01/2022  [place] Cardiologist: Lauree Chandler, MD   Subjective   Mild dyspnea. HR controlled when in bed but elevated when she ambulates.   Inpatient Medications    Scheduled Meds:  apixaban  5 mg Oral BID   cyanocobalamin  1,000 mcg Oral Daily   levothyroxine  112 mcg Oral QAC breakfast   loratadine  10 mg Oral Daily   omega-3 acid ethyl esters  1 g Oral Daily   Continuous Infusions:  diltiazem (CARDIZEM) infusion 7.5 mg/hr (01/01/22 0915)   PRN Meds: acetaminophen   Vital Signs    Vitals:   12/31/21 1521 12/31/21 2331 01/01/22 0415 01/01/22 0500  BP:  131/62 103/66   Pulse: 74 90 61   Resp: '18 20 18   '$ Temp: 99.5 F (37.5 C) 98.4 F (36.9 C) 98.6 F (37 C)   TempSrc: Oral Oral Oral   SpO2: 91%     Weight:   77.6 kg 77.6 kg  Height:        Intake/Output Summary (Last 24 hours) at 01/01/2022 0918 Last data filed at 12/31/2021 1855 Gross per 24 hour  Intake 936.5 ml  Output --  Net 936.5 ml      01/01/2022    5:00 AM 01/01/2022    4:15 AM 12/31/2021    5:19 AM  Last 3 Weights  Weight (lbs) 171 lb 171 lb 170 lb 11.2 oz  Weight (kg) 77.565 kg 77.565 kg 77.429 kg      Telemetry    Atrial fibrillation rates 80-130s - Personally Reviewed  ECG    No new tracing   Physical Exam   General: Well developed, well nourished, NAD  HEENT: OP clear, mucus membranes moist  SKIN: warm, dry. No rashes. Neuro: No focal deficits  Musculoskeletal: Muscle strength 5/5 all ext  Psychiatric: Mood and affect normal  Neck: No JVD Lungs:Clear bilaterally, no wheezes, rhonci, crackles Cardiovascular: Irreg irreg. Soft systolic murmur.  Abdomen:Soft Extremities: No lower extremity edema.   Labs    High Sensitivity Troponin:   Recent Labs  Lab 12/30/21 1151 12/30/21 1427  TROPONINIHS 10 10     Chemistry Recent Labs  Lab 12/30/21 1151 12/30/21 1225  12/31/21 0335 01/01/22 0456  NA 139  --  138 134*  K 3.5  --  4.0 4.0  CL 97*  --  101 100  CO2 29  --  26 26  GLUCOSE 114*  --  121* 128*  BUN 25*  --  20 20  CREATININE 1.17*  --  1.02* 1.06*  CALCIUM 9.6  --  9.2 9.0  MG  --  2.3 2.3  --   GFRNONAA 45*  --  53* 51*  ANIONGAP 13  --  11 8    Lipids No results for input(s): "CHOL", "TRIG", "HDL", "LABVLDL", "LDLCALC", "CHOLHDL" in the last 168 hours.  Hematology Recent Labs  Lab 12/30/21 1151 12/31/21 0335  WBC 10.4 10.4  RBC 4.39 4.18  HGB 14.1 13.3  HCT 41.9 40.5  MCV 95.4 96.9  MCH 32.1 31.8  MCHC 33.7 32.8  RDW 13.7 13.9  PLT 215 206   Thyroid  Recent Labs  Lab 12/30/21 1225  TSH 3.975    BNP Recent Labs  Lab 12/31/21 0335  BNP 338.8*    DDimer No results for input(s): "DDIMER" in the last 168 hours.   Radiology    DG Chest Portable 1  View  Result Date: 12/30/2021 CLINICAL DATA:  Chest pain and atrial fibrillation EXAM: PORTABLE CHEST 1 VIEW COMPARISON:  Chest 08/06/2021 FINDINGS: Cardiac enlargement without heart failure. Postop TAVR in satisfactory position. Atherosclerotic calcification aortic arch Lungs clear without infiltrate or effusion or edema. IMPRESSION: No active disease. Electronically Signed   By: Franchot Gallo M.D.   On: 12/30/2021 12:35    Cardiac Studies   Echo: 11/2021  IMPRESSIONS     1. Left ventricular ejection fraction, by estimation, is 55 to 60%. The  left ventricle has normal function. The left ventricle has no regional  wall motion abnormalities. Left ventricular diastolic parameters are  consistent with Grade II diastolic  dysfunction (pseudonormalization).   2. Right ventricular systolic function is normal. The right ventricular  size is normal. Tricuspid regurgitation signal is inadequate for assessing  PA pressure.   3. Left atrial size was moderately dilated.   4. The mitral valve is degenerative. No evidence of mitral valve  regurgitation.   5. S/p 26 mm  Sapien Aortic Prosthesis. Date 07/27/2016. No PVL. AV max 2.1  m/s. DI 0.44. Mean gradient 10 mmHg (improved from prior study 09/22/2018)  Normal Prosthesis. Aortic valve regurgitation is not visualized.   6. The inferior vena cava is normal in size with greater than 50%  respiratory variability, suggesting right atrial pressure of 3 mmHg.   Comparison(s): EF 55%, mild LVH, TAVR mean 17, peak 29.6 mmHg.   FINDINGS   Left Ventricle: Left ventricular ejection fraction, by estimation, is 55  to 60%. The left ventricle has normal function. The left ventricle has no  regional wall motion abnormalities. The left ventricular internal cavity  size was normal in size. There is   no left ventricular hypertrophy. Left ventricular diastolic parameters  are consistent with Grade II diastolic dysfunction (pseudonormalization).   Right Ventricle: The right ventricular size is normal. Right ventricular  systolic function is normal. Tricuspid regurgitation signal is inadequate  for assessing PA pressure.   Left Atrium: Left atrial size was moderately dilated.   Right Atrium: Right atrial size was normal in size.   Pericardium: There is no evidence of pericardial effusion.   Mitral Valve: The mitral valve is degenerative in appearance. No evidence  of mitral valve regurgitation.   Tricuspid Valve: Tricuspid valve regurgitation is trivial.   Aortic Valve: S/p 26 mm Sapien Aortic Prosthesis. Date 07/27/2016. No PVL.  AV max 2.1 m/s. DI 0.44. Mean gradient 10 mmHg (improved from prior study  09/22/2018) Normal Prosthesis. Aortic valve regurgitation is not  visualized. Aortic valve mean gradient  measures 10.0 mmHg. Aortic valve peak gradient measures 17.9 mmHg. Aortic  valve area, by VTI measures 1.26 cm.   Pulmonic Valve: Pulmonic valve regurgitation is not visualized.   Aorta: The aortic root and ascending aorta are structurally normal, with  no evidence of dilitation.   Venous: The inferior  vena cava is normal in size with greater than 50%  respiratory variability, suggesting right atrial pressure of 3 mmHg.   IAS/Shunts: No atrial level shunt detected by color flow Doppler.    Patient Profile     88 y.o. female with non-obstructive CAD, aortic stenosis s/p TAVR 07/27/16, SVT, chronic diastolic heart failure,  CKD, HLD, hypothyroidism, breast cancer s/p lumpectomy and chemoradiation, cartoid artery disease, who is being seen 12/30/2021 for the evaluation of A fib RVR.   Assessment & Plan    New atrial fibrillation with RVR: Heart rate controlled at rest but elevated with ambulation.  She remains on a Cardizem drip. She has been started on Eliquis. Plans in place for TEE guided cardioversion today. She is NPO. The procedure has been reviewed with the patient.   Nonobstructive CAD without angina: High sensitive troponin negative x 2, left sided pain has resolved.  She did not tolerate beta  blockers. ASA stopped when Eliquis was started. She is statin intolerant.    Chronic diastolic heart failure: Volume status ok on exam.   History of aortic stenosis s/p TAVR: AVR working well by echo November 2023.   Hypothyroidism: continue levothyroxine, TSH WNL  For questions or updates, please contact [place] Please consult [email or web address] for contact info under     Lauree Chandler, MD, [facility] 01/01/2022 9:18 AM

## 2022-01-01 NOTE — Interval H&P Note (Signed)
History and Physical Interval Note:  01/01/2022 12:15 PM  [name]  has presented today for surgery, with the diagnosis of AFIB.  The various methods of treatment have been discussed with the patient and family. After consideration of risks, benefits and other options for treatment, the patient has consented to  Procedure(s): TRANSESOPHAGEAL ECHOCARDIOGRAM (TEE) (N/A) CARDIOVERSION (N/A) as a surgical intervention.  The patient's history has been reviewed, patient examined, no change in status, stable for surgery.  I have reviewed the patient's chart and labs.  Questions were answered to the patient's satisfaction.     Freada Bergeron

## 2022-01-01 NOTE — Transfer of Care (Signed)
Immediate Anesthesia Transfer of Care Note  Patient: [name]  Procedure(s) Performed: TRANSESOPHAGEAL ECHOCARDIOGRAM (TEE) CARDIOVERSION  Patient Location: PACU  Anesthesia Type:MAC  Level of Consciousness: awake, alert , oriented, and patient cooperative  Airway & Oxygen Therapy: Patient Spontanous Breathing and Patient connected to nasal cannula oxygen  Post-op Assessment: Report given to RN and Post -op Vital signs reviewed and stable  Post vital signs: Reviewed and stable  Last Vitals:  Vitals Value Taken Time  BP 113/58 01/01/22 1257  Temp    Pulse 74 01/01/22 1300  Resp 17 01/01/22 1300  SpO2 96 % 01/01/22 1300  Vitals shown include unvalidated device data.  Last Pain:  Vitals:   01/01/22 1205  TempSrc:   PainSc: 0-No pain      Patients Stated Pain Goal: 3 (06/00/45 9977)  Complications: No notable events documented.

## 2022-01-01 NOTE — CV Procedure (Signed)
     Transesophageal Echocardiogram Note  KEYERA HATTABAUGH [id number] 12-Jul-1931  Procedure: Transesophageal Echocardiogram Indications: AFib  Procedure Details Consent: Obtained Time Out: Verified patient identification, verified procedure, site/side was marked, verified correct patient position, special equipment/implants available, Radiology Safety Procedures followed,  medications/allergies/relevent history reviewed, required imaging and test results available.  Performed  Medications: Propofol '140mg'$   Left Ventrical:  50-55%  Mitral Valve: Mild to moderate MR  Aortic Valve: S/p TAVR with normal functioning prosthesis. Trivial central AI. Mean gradient 72mHg.   Tricuspid Valve: Normal structure. Mild TR  Pulmonic Valve: Normal structure. Trivial PR  Left Atrium/ Left atrial appendage: No LAA thrombus  Atrial septum: Grossly normal  Aorta: Severe, grade IV plaque at the aortic arch  Following TEE, the patient underwent successful DCCV with 150J x1. Please see separate note.   Complications: No apparent complications Patient did tolerate procedure well.  HFreada Bergeron MD 01/01/2022, 12:52 PM

## 2022-01-01 NOTE — Anesthesia Postprocedure Evaluation (Signed)
Anesthesia Post Note  Patient: [name]  Procedure(s) Performed: TRANSESOPHAGEAL ECHOCARDIOGRAM (TEE) CARDIOVERSION     Patient location during evaluation: Endoscopy Anesthesia Type: MAC Level of consciousness: awake and alert Pain management: pain level controlled Vital Signs Assessment: post-procedure vital signs reviewed and stable Respiratory status: spontaneous breathing, nonlabored ventilation, respiratory function stable and patient connected to nasal cannula oxygen Cardiovascular status: blood pressure returned to baseline and stable Postop Assessment: no apparent nausea or vomiting Anesthetic complications: no   No notable events documented.  Last Vitals:  Vitals:   01/01/22 1322 01/01/22 1330  BP:  (!) 160/68  Pulse: 75 76  Resp: (!) 25 (!) 26  Temp:  37.1 C  SpO2: 94% 95%    Last Pain:  Vitals:   01/01/22 1330  TempSrc:   PainSc: 0-No pain                 Shanora Christensen DANIEL

## 2022-01-02 DIAGNOSIS — I4891 Unspecified atrial fibrillation: Secondary | ICD-10-CM | POA: Diagnosis not present

## 2022-01-02 MED ORDER — DILTIAZEM HCL ER COATED BEADS 180 MG PO CP24: 180.0000 mg | ORAL_CAPSULE | Freq: Every day | ORAL | 1 refills | Status: DC

## 2022-01-02 MED ORDER — APIXABAN 5 MG PO TABS: 5.0000 mg | ORAL_TABLET | Freq: Two times a day (BID) | ORAL | 3 refills | Status: DC

## 2022-01-02 MED ORDER — GUAIFENESIN-DM 100-10 MG/5ML PO SYRP
15.0000 mL | ORAL_SOLUTION | ORAL | Status: DC | PRN
  Administered 2022-01-02: 15 mL via ORAL
  Filled 2022-01-02 (×2): qty 15

## 2022-01-02 NOTE — Progress Notes (Addendum)
Pt put on [place] 2L at 2200 to keep Pt O2 sat < 92%. Per order

## 2022-01-02 NOTE — Progress Notes (Signed)
Rounding Note    Patient Name: [name] Date of Encounter: 01/02/2022  [place] Cardiologist: Janice Chandler, MD   Subjective   Feels well just ambulated in hallway without difficulty.  Maintaining sinus rhythm postconversion.  Inpatient Medications    Scheduled Meds:  apixaban  5 mg Oral BID   cyanocobalamin  1,000 mcg Oral Daily   diltiazem  180 mg Oral Daily   levothyroxine  112 mcg Oral QAC breakfast   loratadine  10 mg Oral Daily   omega-3 acid ethyl esters  1 g Oral Daily   Continuous Infusions:  PRN Meds: acetaminophen, guaiFENesin-dextromethorphan   Vital Signs    Vitals:   01/01/22 1635 01/01/22 1828 01/01/22 2159 01/02/22 0552  BP: 135/69 129/61 127/78 (!) 143/81  Pulse: 87  70 76  Resp: '17 18 19 20  '$ Temp: 98.6 F (37 C)  98.3 F (36.8 C) 97.9 F (36.6 C)  TempSrc: Oral  Oral Oral  SpO2:   90% 94%  Weight:    77.3 kg  Height:        Intake/Output Summary (Last 24 hours) at 01/02/2022 1015 Last data filed at 01/01/2022 1254 Gross per 24 hour  Intake 100 ml  Output --  Net 100 ml      01/02/2022    5:52 AM 01/01/2022    5:00 AM 01/01/2022    4:15 AM  Last 3 Weights  Weight (lbs) 170 lb 8 oz 171 lb 171 lb  Weight (kg) 77.338 kg 77.565 kg 77.565 kg      Telemetry    Sinus rhythm with PACs- Personally Reviewed  ECG    Sinus rhythm PACs 76 bpm postconversion- Personally Reviewed  Physical Exam   GEN: No acute distress.   Neck: No JVD Cardiac: RRR, occasional ectopy, soft systolic murmur, no rubs, or gallops.  Respiratory: Clear to auscultation bilaterally. GI: Soft, nontender, non-distended  MS: No edema; No deformity. Neuro:  Nonfocal  Psych: Normal affect   Labs    High Sensitivity Troponin:   Recent Labs  Lab 12/30/21 1151 12/30/21 1427  TROPONINIHS 10 10     Chemistry Recent Labs  Lab 12/30/21 1151 12/30/21 1225 12/31/21 0335 01/01/22 0456  NA 139  --  138 134*  K 3.5  --  4.0 4.0   CL 97*  --  101 100  CO2 29  --  26 26  GLUCOSE 114*  --  121* 128*  BUN 25*  --  20 20  CREATININE 1.17*  --  1.02* 1.06*  CALCIUM 9.6  --  9.2 9.0  MG  --  2.3 2.3  --   GFRNONAA 45*  --  53* 51*  ANIONGAP 13  --  11 8    Lipids No results for input(s): "CHOL", "TRIG", "HDL", "LABVLDL", "LDLCALC", "CHOLHDL" in the last 168 hours.  Hematology Recent Labs  Lab 12/30/21 1151 12/31/21 0335  WBC 10.4 10.4  RBC 4.39 4.18  HGB 14.1 13.3  HCT 41.9 40.5  MCV 95.4 96.9  MCH 32.1 31.8  MCHC 33.7 32.8  RDW 13.7 13.9  PLT 215 206   Thyroid  Recent Labs  Lab 12/30/21 1225  TSH 3.975    BNP Recent Labs  Lab 12/31/21 0335  BNP 338.8*    DDimer No results for input(s): "DDIMER" in the last 168 hours.   Radiology    ECHO TEE  Result Date: 01/01/2022    TRANSESOPHOGEAL ECHO REPORT   Patient Name:  [name] Date of Exam: 01/01/2022 Medical Rec #:  [id number]        Height:       62.0 in Accession #:    [id number]       Weight:       171.0 lb Date of Birth:  June 28, 1931       BSA:          1.789 m Patient Age:    88 years         BP:           126/77 mmHg Patient Gender: F                HR:           76 bpm. Exam Location:  Inpatient Procedure: Transesophageal Echo, Cardiac Doppler and Color Doppler Indications:     I48.91* Unspeicified atrial fibrillation  History:         Patient has prior history of Echocardiogram examinations, most                  recent 11/13/2021. Abnormal ECG, Aortic Valve Disease;                  Arrythmias:Atrial Fibrillation. Aortic stenosis. TAVR                  procedure.                  Aortic Valve: 26 mm Sapien prosthetic, stented (TAVR) valve is                  present in the aortic position. Procedure Date: 07/30/2016.  Sonographer:     Roseanna Rainbow RDCS Referring Phys:  [id number] [facility]ERTS Diagnosing Phys: Janice Kaufman MD PROCEDURE: After discussion of the risks and benefits of a TEE, an informed consent was obtained. The  transesophogeal probe was passed without difficulty through the esophogus of the patient. Imaged were obtained with the patient in a left lateral decubitus position. Sedation performed by different physician. The patient was monitored while under deep sedation. Anesthestetic sedation was provided intravenously by Anesthesiology: '122mg'$  of Propofol. The patient developed no complications during the procedure. A successful direct current cardioversion was performed at 150 joules with 1 attempt.  IMPRESSIONS  1. Left ventricular ejection fraction, by estimation, is 50 to 55%. The left ventricle has low normal function.  2. Right ventricular systolic function is normal. The right ventricular size is normal.  3. Left atrial size was severely dilated. No left atrial/left atrial appendage thrombus was detected. The LAA emptying velocity was 49 cm/s.  4. The mitral valve is degenerative. Mild to moderate mitral valve regurgitation.  5. The aortic valve has been repaired/replaced. Aortic valve regurgitation is trivial. There is a 26 mm Sapien prosthetic (TAVR) valve present in the aortic position. Procedure Date: 07/30/2016. Echo findings are consistent with normal structure and function of the aortic valve prosthesis. Aortic valve mean gradient measures 6.0 mmHg. Aortic valve Vmax measures 1.58 m/s.  6. There is Severe, mobile (Grade V) plaque involving the aortic arch and ascending aorta (clip 78).  7. Following TEE, the patient underwent successful DCCV with 150J x1. FINDINGS  Left Ventricle: Left ventricular ejection fraction, by estimation, is 50 to 55%. The left ventricle has low normal function. The left ventricular internal cavity size was normal in size. Right Ventricle: The right ventricular size is normal. No increase in right ventricular wall thickness. Right ventricular systolic  function is normal. Left Atrium: Left atrial size was severely dilated. No left atrial/left atrial appendage thrombus was detected. The  LAA emptying velocity was 49 cm/s. Right Atrium: Right atrial size was normal in size. Pericardium: There is no evidence of pericardial effusion. Mitral Valve: The mitral valve is degenerative in appearance. There is mild thickening of the mitral valve leaflet(s). There is mild calcification of the mitral valve leaflet(s). Mild to moderate mitral valve regurgitation. Tricuspid Valve: The tricuspid valve is normal in structure. Tricuspid valve regurgitation is trivial. Aortic Valve: The aortic valve has been repaired/replaced. Aortic valve regurgitation is trivial. Aortic valve mean gradient measures 6.0 mmHg. Aortic valve peak gradient measures 10.0 mmHg. There is a 26 mm Sapien prosthetic, stented (TAVR) valve present in the aortic position. Procedure Date: 07/30/2016. Echo findings are consistent with normal structure and function of the aortic valve prosthesis. Pulmonic Valve: The pulmonic valve was normal in structure. Pulmonic valve regurgitation is trivial. Aorta: There is severe, mobile (Grade V) plaque involving the aortic arch and ascending aorta. IAS/Shunts: The atrial septum is grossly normal. Additional Comments: Spectral Doppler performed. AORTIC VALVE AV Vmax:      158.00 cm/s AV Vmean:     109.000 cm/s AV VTI:       0.286 m AV Peak Grad: 10.0 mmHg AV Mean Grad: 6.0 mmHg Janice Kaufman MD Electronically signed by Janice Kaufman MD Signature Date/Time: 01/01/2022/4:01:43 PM    Final     Cardiac Studies   EF 55% aortic atherosclerosis noted.  TAVR valve 26 mm in place.  Patient Profile     88 y.o. female status post TAVR with successful cardioversion on 12/29.  Assessment & Plan    Paroxysmal atrial fibrillation - New onset.  TEE cardioversion successful.  PACs noted on telemetry. -Diltiazem CD 180 mg daily.  Dose has been slightly increased from 120 mg at home.  Chronic anticoagulation - New start Eliquis.  Stopping aspirin 81 mg.  She also knows not to take Advil for knee pain.   She can take Tylenol.  History of TAVR - November 2023.  Normal.  Hypothyroidism - On levothyroxine.  Nonobstructive CAD without angina - Troponins are negative left-sided pain resolved.  Aspirin stopped when Eliquis was started.  She is statin intolerant.  Aortic atherosclerosis - Continue with medical management.  Seen on TEE.  Spoke with her daughter who is an Scientist, research (life sciences), worked with Dr. Cyndia Bent and continues to work in the outpatient center with neurosurgeons for instance.  Okay for discharge home.  For questions or updates, please contact [place] Please consult [email or web address] for contact info under        Signed, Candee Furbish, MD  01/02/2022, 10:15 AM

## 2022-01-02 NOTE — Discharge Summary (Cosign Needed)
Discharge Summary    Patient ID: [name] MRN: [id number]; DOB: Sep 13, 1931  Admit date: 12/30/2021 Discharge date: 01/02/2022  PCP:  [name], MD   [facility] Cardiologist:  Lauree Chandler, MD     Discharge Diagnoses    Principal Problem:   Atrial fibrillation with rapid ventricular response [facility]) Active Problems:   Chronic diastolic heart failure ([place])   Hypothyroidism   Atrial fibrillation with RVR ([place])   Diagnostic Studies/Procedures    TEE: 01/01/2022  IMPRESSIONS     1. Left ventricular ejection fraction, by estimation, is 50 to 55%. The  left ventricle has low normal function.   2. Right ventricular systolic function is normal. The right ventricular  size is normal.   3. Left atrial size was severely dilated. No left atrial/left atrial  appendage thrombus was detected. The LAA emptying velocity was 49 cm/s.   4. The mitral valve is degenerative. Mild to moderate mitral valve  regurgitation.   5. The aortic valve has been repaired/replaced. Aortic valve  regurgitation is trivial. There is a 26 mm Sapien prosthetic (TAVR) valve  present in the aortic position. Procedure Date: 07/30/2016. Echo findings  are consistent with normal structure and  function of the aortic valve prosthesis. Aortic valve mean gradient  measures 6.0 mmHg. Aortic valve Vmax measures 1.58 m/s.   6. There is Severe, mobile (Grade V) plaque involving the aortic arch and  ascending aorta (clip 78).   7. Following TEE, the patient underwent successful DCCV with 150J x1.   FINDINGS   Left Ventricle: Left ventricular ejection fraction, by estimation, is 50  to 55%. The left ventricle has low normal function. The left ventricular  internal cavity size was normal in size.   Right Ventricle: The right ventricular size is normal. No increase in  right ventricular wall thickness. Right ventricular systolic function is  normal.   Left Atrium:  Left atrial size was severely dilated. No left atrial/left  atrial appendage thrombus was detected. The LAA emptying velocity was 49  cm/s.   Right Atrium: Right atrial size was normal in size.   Pericardium: There is no evidence of pericardial effusion.   Mitral Valve: The mitral valve is degenerative in appearance. There is  mild thickening of the mitral valve leaflet(s). There is mild  calcification of the mitral valve leaflet(s). Mild to moderate mitral  valve regurgitation.   Tricuspid Valve: The tricuspid valve is normal in structure. Tricuspid  valve regurgitation is trivial.   Aortic Valve: The aortic valve has been repaired/replaced. Aortic valve  regurgitation is trivial. Aortic valve mean gradient measures 6.0 mmHg.  Aortic valve peak gradient measures 10.0 mmHg. There is a 26 mm Sapien  prosthetic, stented (TAVR) valve  present in the aortic position. Procedure Date: 07/30/2016. Echo findings  are consistent with normal structure and function of the aortic valve  prosthesis.   Pulmonic Valve: The pulmonic valve was normal in structure. Pulmonic valve  regurgitation is trivial.   Aorta: There is severe, mobile (Grade V) plaque involving the aortic arch  and ascending aorta.   IAS/Shunts: The atrial septum is grossly normal.   Additional Comments: Spectral Doppler performed.   _____________   History of Present Illness     [name] is a 88 y.o. female with non-obstructive CAD, aortic stenosis s/p TAVR 07/27/16, SVT, chronic diastolic heart failure,  CKD, HLD, hypothyroidism, breast cancer s/p lumpectomy and chemoradiation, cartoid artery disease, who was seen 12/30/2021 for  the evaluation of A fib RVR.   Hospital Course     A fib RVR, new diagnosis History of PVC, PAC, SVTs -- Presented with resting left-sided chest pain while in atrial fibrillation with RVR -- TSH WNL -- respiratory panel negative  -- Placed on IV diltiazem and underwent successful  TEE/DCCV with restoration of sinus rhythm.   -- Transitioned to diltiazem 100 mg daily -- New start/continue Eliquis '5mg'$  BID   Nonobstructive CAD -- High sensitive troponin negative x 2, left sided pain has resolved, unlikely ACS -- statin intolerance, ASA stopped with need for Eliquis  -- Not on beta-blocker due to intolerance per office telephone note 12/24/2021   Chronic diastolic heart failure History of aortic stenosis s/p TAVR -- Echo from 11/2021 stable -- Resumed on home Lasix with potassium supplement at DC   Hypothyroidism -- continue levothyroxine -- TSH WNL  Aortic atherosclerosis -- Seen on TEE -- Continue medical management  Patient was seen by Dr. Marlou Porch and deemed stable for discharge home.  Follow-up arranged in the office.  Medications to patient's pharmacy of choice.  Stated by Sherian Rein.D. prior to discharge.  Did the patient have an acute coronary syndrome (MI, NSTEMI, STEMI, etc) this admission?:  No                               Did the patient have a percutaneous coronary intervention (stent / angioplasty)?:  No.     The patient will be scheduled for a TOC follow up appointment in 10-14 days.  A message has been sent to the [facility] and Scheduling Pool at the office where the patient should be seen for follow up.  _____________  Discharge Vitals Blood pressure (!) 128/56, pulse 64, temperature 98.2 F (36.8 C), temperature source Oral, resp. rate 20, height '5\' 2"'$  (1.575 m), weight 77.3 kg, SpO2 93 %.  Filed Weights   01/01/22 0415 01/01/22 0500 01/02/22 0552  Weight: 77.6 kg 77.6 kg 77.3 kg    Labs & Radiologic Studies    CBC Recent Labs    12/31/21 0335  WBC 10.4  HGB 13.3  HCT 40.5  MCV 96.9  PLT 130   Basic Metabolic Panel Recent Labs    12/30/21 1225 12/31/21 0335 01/01/22 0456  NA  --  138 134*  K  --  4.0 4.0  CL  --  101 100  CO2  --  26 26  GLUCOSE  --  121* 128*  BUN  --  20 20  CREATININE  --  1.02* 1.06*  CALCIUM  --  9.2  9.0  MG 2.3 2.3  --    Liver Function Tests No results for input(s): "AST", "ALT", "ALKPHOS", "BILITOT", "PROT", "ALBUMIN" in the last 72 hours. No results for input(s): "LIPASE", "AMYLASE" in the last 72 hours. High Sensitivity Troponin:   Recent Labs  Lab 12/30/21 1151 12/30/21 1427  TROPONINIHS 10 10    BNP Invalid input(s): "POCBNP" D-Dimer No results for input(s): "DDIMER" in the last 72 hours. Hemoglobin A1C No results for input(s): "HGBA1C" in the last 72 hours. Fasting Lipid Panel No results for input(s): "CHOL", "HDL", "LDLCALC", "TRIG", "CHOLHDL", "LDLDIRECT" in the last 72 hours. Thyroid Function Tests Recent Labs    12/30/21 1225  TSH 3.975   _____________  ECHO TEE  Result Date: 01/01/2022    TRANSESOPHOGEAL ECHO REPORT   Patient Name:   [name] Date of Exam:  01/01/2022 Medical Rec #:  [id number]        Height:       62.0 in Accession #:    [id number]       Weight:       171.0 lb Date of Birth:  [birth year]       BSA:          1.789 m Patient Age:    33 years         BP:           126/77 mmHg Patient Gender: F                HR:           76 bpm. Exam Location:  Inpatient Procedure: Transesophageal Echo, Cardiac Doppler and Color Doppler Indications:     I48.91* Unspeicified atrial fibrillation  History:         Patient has prior history of Echocardiogram examinations, most                  recent 11/13/2021. Abnormal ECG, Aortic Valve Disease;                  Arrythmias:Atrial Fibrillation. Aortic stenosis. TAVR                  procedure.                  Aortic Valve: 26 mm Sapien prosthetic, stented (TAVR) valve is                  present in the aortic position. Procedure Date: 07/30/2016.  Sonographer:     Roseanna Rainbow RDCS Referring Phys:  [id number] [facility] Rishit Burkhalter Diagnosing Phys: Gwyndolyn Kaufman MD PROCEDURE: After discussion of the risks and benefits of a TEE, an informed consent was obtained. The transesophogeal probe was passed without difficulty through  the esophogus of the patient. Imaged were obtained with the patient in a left lateral decubitus position. Sedation performed by different physician. The patient was monitored while under deep sedation. Anesthestetic sedation was provided intravenously by Anesthesiology: '122mg'$  of Propofol. The patient developed no complications during the procedure. A successful direct current cardioversion was performed at 150 joules with 1 attempt.  IMPRESSIONS  1. Left ventricular ejection fraction, by estimation, is 50 to 55%. The left ventricle has low normal function.  2. Right ventricular systolic function is normal. The right ventricular size is normal.  3. Left atrial size was severely dilated. No left atrial/left atrial appendage thrombus was detected. The LAA emptying velocity was 49 cm/s.  4. The mitral valve is degenerative. Mild to moderate mitral valve regurgitation.  5. The aortic valve has been repaired/replaced. Aortic valve regurgitation is trivial. There is a 26 mm Sapien prosthetic (TAVR) valve present in the aortic position. Procedure Date: 07/30/2016. Echo findings are consistent with normal structure and function of the aortic valve prosthesis. Aortic valve mean gradient measures 6.0 mmHg. Aortic valve Vmax measures 1.58 m/s.  6. There is Severe, mobile (Grade V) plaque involving the aortic arch and ascending aorta (clip 78).  7. Following TEE, the patient underwent successful DCCV with 150J x1. FINDINGS  Left Ventricle: Left ventricular ejection fraction, by estimation, is 50 to 55%. The left ventricle has low normal function. The left ventricular internal cavity size was normal in size. Right Ventricle: The right ventricular size is normal. No increase in right ventricular wall thickness. Right ventricular systolic function is normal. Left Atrium: Left  atrial size was severely dilated. No left atrial/left atrial appendage thrombus was detected. The LAA emptying velocity was 49 cm/s. Right Atrium: Right atrial  size was normal in size. Pericardium: There is no evidence of pericardial effusion. Mitral Valve: The mitral valve is degenerative in appearance. There is mild thickening of the mitral valve leaflet(s). There is mild calcification of the mitral valve leaflet(s). Mild to moderate mitral valve regurgitation. Tricuspid Valve: The tricuspid valve is normal in structure. Tricuspid valve regurgitation is trivial. Aortic Valve: The aortic valve has been repaired/replaced. Aortic valve regurgitation is trivial. Aortic valve mean gradient measures 6.0 mmHg. Aortic valve peak gradient measures 10.0 mmHg. There is a 26 mm Sapien prosthetic, stented (TAVR) valve present in the aortic position. Procedure Date: 07/30/2016. Echo findings are consistent with normal structure and function of the aortic valve prosthesis. Pulmonic Valve: The pulmonic valve was normal in structure. Pulmonic valve regurgitation is trivial. Aorta: There is severe, mobile (Grade V) plaque involving the aortic arch and ascending aorta. IAS/Shunts: The atrial septum is grossly normal. Additional Comments: Spectral Doppler performed. AORTIC VALVE AV Vmax:      158.00 cm/s AV Vmean:     109.000 cm/s AV VTI:       0.286 m AV Peak Grad: 10.0 mmHg AV Mean Grad: 6.0 mmHg Gwyndolyn Kaufman MD Electronically signed by Gwyndolyn Kaufman MD Signature Date/Time: 01/01/2022/4:01:43 PM    Final    DG Chest Portable 1 View  Result Date: 12/30/2021 CLINICAL DATA:  Chest pain and atrial fibrillation EXAM: PORTABLE CHEST 1 VIEW COMPARISON:  Chest 08/06/2021 FINDINGS: Cardiac enlargement without heart failure. Postop TAVR in satisfactory position. Atherosclerotic calcification aortic arch Lungs clear without infiltrate or effusion or edema. IMPRESSION: No active disease. Electronically Signed   By: Franchot Gallo M.D.   On: 12/30/2021 12:35   Disposition   Pt is being discharged home today in good condition.  Follow-up Plans & Appointments     Follow-up  Information     Elgie Collard, PA-C Follow up on 01/08/2022.   Specialty: Cardiology Why: at 1:55pm for your follow up with Dr. Camillia Herter PA Johann Capers Contact information: [address] [place] [address] [phone]                Discharge Instructions     Amb referral to AFIB Clinic   Complete by: As directed    Call MD for:  difficulty breathing, headache or visual disturbances   Complete by: As directed    Call MD for:  persistant dizziness or light-headedness   Complete by: As directed    Diet - low sodium heart healthy   Complete by: As directed    Discharge instructions   Complete by: As directed    If you notice any bleeding such as blood in stool, black tarry stools, blood in urine, nosebleeds or any other unusual bleeding, call your doctor immediately. It is not normal to have this kind of bleeding while on a blood thinner and usually indicates there is an underlying problem with one of your body systems that needs to be checked out.   Increase activity slowly   Complete by: As directed         Discharge Medications   Allergies as of 01/02/2022       Reactions   Atorvastatin Other (See Comments)   Myalgias    Compazine [prochlorperazine] Other (See Comments)   Unknown, pt does not remember   Floxin [ofloxacin] Other (See Comments)  Unknown, pt does not remember reaction but was allergic reaction   Livalo [pitavastatin] Other (See Comments)   Leg pain   Pravastatin Sodium Other (See Comments)   mylgias    Simvastatin Other (See Comments)   Elevated lft's 06/2011   Fenofibrate Rash   Rash         Medication List     STOP taking these medications    aspirin EC 81 MG tablet       TAKE these medications    apixaban 5 MG Tabs tablet Commonly known as: ELIQUIS Take 1 tablet (5 mg total) by mouth 2 (two) times daily.   ascorbic acid 500 MG tablet Commonly known as: VITAMIN C Take 500 mg by mouth daily.   BIOFREEZE  EX Apply 1 application topically as needed (pain).   CALCIUM 600/VITAMIN D PO Take 1 tablet by mouth daily.   cyanocobalamin 1000 MCG tablet Commonly known as: VITAMIN B12 Take 1,000 mcg by mouth daily.   Delsym 30 MG/5ML liquid Generic drug: dextromethorphan Take 30 mg by mouth at bedtime as needed for cough.   diltiazem 180 MG 24 hr capsule Commonly known as: CARDIZEM CD Take 1 capsule (180 mg total) by mouth daily. Start taking on: January 03, 2022 What changed:  medication strength how much to take   ergocalciferol 1.25 MG (50000 UT) capsule Commonly known as: VITAMIN D2 Take 50,000 Units by mouth once a week. Saturday's   fish oil-omega-3 fatty acids 1000 MG capsule Take 1 g by mouth daily.   furosemide 20 MG tablet Commonly known as: LASIX Take 2 tablets (40 mg total) by mouth daily.   levothyroxine 112 MCG tablet Commonly known as: SYNTHROID Take 112 mcg by mouth daily before breakfast.   Magnesium 400 MG Caps Take 400 mg by mouth daily.   multivitamin with minerals Tabs tablet Take 1 tablet by mouth daily.   potassium chloride 10 MEQ tablet Commonly known as: KLOR-CON M TAKE 1 TABLET BY MOUTH AS NEEDED( WITH LASIX)   QC Fexofenadine Hydrochloride 180 MG tablet Generic drug: fexofenadine Take 180 mg by mouth daily.        Outstanding Labs/Studies   N/a   Duration of Discharge Encounter   Greater than 30 minutes including physician time.  Signed, Reino Bellis, NP 01/02/2022, 12:14 PM

## 2022-01-03 ENCOUNTER — Encounter (HOSPITAL_COMMUNITY): Payer: Self-pay | Admitting: Cardiology

## 2022-01-07 ENCOUNTER — Telehealth: Payer: Self-pay | Admitting: Cardiovascular Disease

## 2022-01-07 NOTE — Telephone Encounter (Signed)
Pt daughter states pt had a cardioversion and shortly after developed a rash. She states she thought it was where she had the cardioversion but now the rash is all over. She states she is unsure if it is related to her starting eliquis. Please advise.

## 2022-01-07 NOTE — Progress Notes (Signed)
Office Visit    Patient Name: [name] Date of Encounter: 01/08/2022  PCP:  Gaynelle Arabian, [place]  Cardiologist:  Lauree Chandler, MD  Advanced Practice Provider:  No care team member to display Electrophysiologist:  None   HPI    [name] is a 89 y.o. female with nonobstructive CAD, aortic stenosis status post TAVR 07/27/2016, SVT, chronic diastolic heart failure, CKD some HLD, hypothyroidism, breast cancer status post lumpectomy and chemoradiation, carotid artery disease, who was seen 12/30/2021 for evaluation of A-fib with RVR presents today for follow-up appointment.  She was started on Eliquis 5 mg twice daily and transition to diltiazem 100 mg daily.  She had underwent successful TEE/DCCV with restoration of sinus rhythm.  She was not on a beta-blocker due to intolerance per office note 12/24/2021.  Today, she tells me that after her cardioversion she had an imprint where the pad was placed. She thought it might have been a reaction to the adhesive. Before she knew it she had a severe rash all over her trunk, arms, neck, legs, and even scalp. Likely a drug reaction. She was started on Eliquis and Cardizem while in the hospital. She was hold to discontinue both asap and start on atenolol and xarelto. She denied SOB. Discussed case with PharmD which confirmed likely a drug rash. NSR today.   Reports no shortness of breath nor dyspnea on exertion. Reports no chest pain, pressure, or tightness. No edema, orthopnea, PND. Reports no palpitations.    Past Medical History    Past Medical History:  Diagnosis Date   Arthritis    hands, knees   CAD (coronary artery disease), native coronary artery cardiologist--- dr Angelena Form   a. mild non obst CAD per cath 07-18-2015   Carotid artery disease ([place])    Carotid US 09/2018: Bilateral ICA 40-59 // Carotid US 11/21: Bilateral ICA 40-59; repeat 1 year    Chronic diastolic CHF (congestive  heart failure) ([place])    followed by cardiology   CKD (chronic kidney disease), stage III ([place])    followed by pcp   Dyspnea    per pt when she walks which is usual for her   History of gastric ulcer    remote yrs ago   History of rheumatic fever as a child    History of right breast cancer 2006   s/p right partial mastectomy w/ node dissection's,  completed chemo/ radiation same year  (05-21-2020 pt stated no recurrence)   Hyperlipidemia    Hypothyroidism    followed by pcp   Osteoporosis    PMB (postmenopausal bleeding)    PSVT (paroxysmal supraventricular tachycardia) 10/2012   in ED resolved w/ vagal maneuver    S/P aortic valve replacement 07/27/2016   for severe stenosis;  last echo in epic 09-22-2018 ef 55-60%, mild TR, aortic valve area 1.4cm^2 and mean gradiant 17 mmHg   Seasonal allergies    Vitamin D deficiency    Past Surgical History:  Procedure Laterality Date   CARDIAC CATHETERIZATION N/A 07/18/2015   Procedure: Right/Left Heart Cath and Coronary Angiography;  Surgeon: [name], MD;  Location: [place];  Service: Cardiovascular;  Laterality: N/A;   CARDIOVERSION N/A 01/01/2022   Procedure: CARDIOVERSION;  Surgeon: Freada Bergeron, MD;  Location: [facility] ENDOSCOPY;  Service: Cardiovascular;  Laterality: N/A;   CATARACT EXTRACTION W/ INTRAOCULAR LENS  IMPLANT, BILATERAL     2017   CHOLECYSTECTOMY N/A 08/06/2014  Procedure: LAPAROSCOPIC CHOLECYSTECTOMY ;  Surgeon: Rolm Bookbinder, MD;  Location: [place];  Service: General;  Laterality: N/A;   HYSTEROSCOPY WITH D & C N/A 05/26/2020   Procedure: DILATATION AND CURETTAGE /HYSTEROSCOPY;  Surgeon: Salvadore Dom, MD;  [facility];  Service: Gynecology;  Laterality: N/A;  Request case for [place]   PARTIAL MASTECTOMY WITH AXILLARY SENTINEL LYMPH NODE BIOPSY Right 2006   TEE WITHOUT CARDIOVERSION N/A 07/27/2016   Procedure: TRANSESOPHAGEAL ECHOCARDIOGRAM (TEE);  Surgeon:  [name], MD;  Location: [place];  Service: Open Heart Surgery;  Laterality: N/A;   TEE WITHOUT CARDIOVERSION N/A 01/01/2022   Procedure: TRANSESOPHAGEAL ECHOCARDIOGRAM (TEE);  Surgeon: Freada Bergeron, MD;  Location: [facility] ENDOSCOPY;  Service: Cardiovascular;  Laterality: N/A;   [place], benign per pt   TONSILLECTOMY  age 25   TOTAL HIP ARTHROPLASTY Left 2003   TOTAL HIP ARTHROPLASTY Right 08/28/2013   Procedure: RIGHT TOTAL HIP ARTHROPLASTY ANTERIOR APPROACH;  Surgeon: Mauri Pole, MD;  Location: [facility];  Service: Orthopedics;  Laterality: Right;   TOTAL KNEE ARTHROPLASTY Right 01-16-2007  '@[facility]'$    TRANSCATHETER AORTIC VALVE REPLACEMENT, TRANSFEMORAL N/A 07/27/2016   Procedure: TRANSCATHETER AORTIC VALVE REPLACEMENT, TRANSFEMORAL;  Surgeon: [name], MD;  Location: [place];  Service: Open Heart Surgery;  Laterality: N/A;    Allergies  Allergies  Allergen Reactions   Atorvastatin Other (See Comments)    Myalgias    Compazine [Prochlorperazine] Other (See Comments)    Unknown, pt does not remember   Floxin [Ofloxacin] Other (See Comments)    Unknown, pt does not remember reaction but was allergic reaction   Livalo [Pitavastatin] Other (See Comments)    Leg pain   Pravastatin Sodium Other (See Comments)    mylgias    Simvastatin Other (See Comments)    Elevated lft's 06/2011   Fenofibrate Rash    Rash     EKGs/Labs/Other Studies Reviewed:   The following studies were reviewed today:  TEE: 01/01/2022   IMPRESSIONS     1. Left ventricular ejection fraction, by estimation, is 50 to 55%. The  left ventricle has low normal function.   2. Right ventricular systolic function is normal. The right ventricular  size is normal.   3. Left atrial size was severely dilated. No left atrial/left atrial  appendage thrombus was detected. The LAA emptying velocity was 49 cm/s.   4. The mitral valve is degenerative. Mild to moderate  mitral valve  regurgitation.   5. The aortic valve has been repaired/replaced. Aortic valve  regurgitation is trivial. There is a 26 mm Sapien prosthetic (TAVR) valve  present in the aortic position. Procedure Date: 07/30/2016. Echo findings  are consistent with normal structure and  function of the aortic valve prosthesis. Aortic valve mean gradient  measures 6.0 mmHg. Aortic valve Vmax measures 1.58 m/s.   6. There is Severe, mobile (Grade V) plaque involving the aortic arch and  ascending aorta (clip 78).   7. Following TEE, the patient underwent successful DCCV with 150J x1.   FINDINGS   Left Ventricle: Left ventricular ejection fraction, by estimation, is 50  to 55%. The left ventricle has low normal function. The left ventricular  internal cavity size was normal in size.   Right Ventricle: The right ventricular size is normal. No increase in  right ventricular wall thickness. Right ventricular systolic function is  normal.   Left Atrium: Left atrial size was severely dilated.  No left atrial/left  atrial appendage thrombus was detected. The LAA emptying velocity was 49  cm/s.   Right Atrium: Right atrial size was normal in size.   Pericardium: There is no evidence of pericardial effusion.   Mitral Valve: The mitral valve is degenerative in appearance. There is  mild thickening of the mitral valve leaflet(s). There is mild  calcification of the mitral valve leaflet(s). Mild to moderate mitral  valve regurgitation.   Tricuspid Valve: The tricuspid valve is normal in structure. Tricuspid  valve regurgitation is trivial.   Aortic Valve: The aortic valve has been repaired/replaced. Aortic valve  regurgitation is trivial. Aortic valve mean gradient measures 6.0 mmHg.  Aortic valve peak gradient measures 10.0 mmHg. There is a 26 mm Sapien  prosthetic, stented (TAVR) valve  present in the aortic position. Procedure Date: 07/30/2016. Echo findings  are consistent with normal  structure and function of the aortic valve  prosthesis.   Pulmonic Valve: The pulmonic valve was normal in structure. Pulmonic valve  regurgitation is trivial.   Aorta: There is severe, mobile (Grade V) plaque involving the aortic arch  and ascending aorta.   IAS/Shunts: The atrial septum is grossly normal.   Additional Comments: Spectral Doppler performed.     EKG:  EKG is not ordered today.  Recent Labs: 12/30/2021: TSH 3.975 12/31/2021: B Natriuretic Peptide 338.8; Hemoglobin 13.3; Magnesium 2.3; Platelets 206 01/01/2022: BUN 20; Creatinine, Ser 1.06; Potassium 4.0; Sodium 134  Recent Lipid Panel No results found for: "CHOL", "TRIG", "HDL", "CHOLHDL", "VLDL", "LDLCALC", "LDLDIRECT"  Risk Assessment/Calculations:   CHA2DS2-VASc Score = 6   This indicates a 9.7% annual risk of stroke. The patient's score is based upon: CHF History: 1 HTN History: 1 Diabetes History: 0 Stroke History: 0 Vascular Disease History: 1 Age Score: 2 Gender Score: 1     Home Medications   Current Meds  Medication Sig   acetaminophen (TYLENOL) 650 MG CR tablet Take 650 mg by mouth daily.   atenolol (TENORMIN) 25 MG tablet Take 1 tablet (25 mg total) by mouth daily.   Calcium Carbonate-Vitamin D (CALCIUM 600/VITAMIN D PO) Take 1 tablet by mouth daily.   dextromethorphan (DELSYM) 30 MG/5ML liquid Take 30 mg by mouth at bedtime as needed for cough.   ergocalciferol (VITAMIN D2) 1.25 MG (50000 UT) capsule Take 50,000 Units by mouth once a week. Saturday's   fexofenadine (QC FEXOFENADINE HYDROCHLORIDE) 180 MG tablet Take 180 mg by mouth daily.   furosemide (LASIX) 20 MG tablet Take 2 tablets (40 mg total) by mouth daily.   hydrocortisone 2.5 % cream Apply topically daily.   levothyroxine (SYNTHROID, LEVOTHROID) 112 MCG tablet Take 112 mcg by mouth daily before breakfast.   Magnesium 400 MG CAPS Take 400 mg by mouth daily.    Menthol, Topical Analgesic, (BIOFREEZE EX) Apply 1 application  topically as needed (pain).    methylPREDNISolone (MEDROL DOSEPAK) 4 MG TBPK tablet Use as directed   Multiple Vitamin (MULTIVITAMIN WITH MINERALS) TABS tablet Take 1 tablet by mouth daily.   potassium chloride (KLOR-CON M) 10 MEQ tablet TAKE 1 TABLET BY MOUTH AS NEEDED( WITH LASIX)   Rivaroxaban (XARELTO) 15 MG TABS tablet Take 1 tablet (15 mg total) by mouth daily with supper.   vitamin C (ASCORBIC ACID) 500 MG tablet Take 500 mg by mouth daily.   [DISCONTINUED] apixaban (ELIQUIS) 5 MG TABS tablet Take 1 tablet (5 mg total) by mouth 2 (two) times daily.   [DISCONTINUED] diltiazem (CARDIZEM CD) 180 MG  24 hr capsule Take 1 capsule (180 mg total) by mouth daily.   [DISCONTINUED] fish oil-omega-3 fatty acids 1000 MG capsule Take 1 g by mouth daily.   [DISCONTINUED] vitamin B-12 (CYANOCOBALAMIN) 1000 MCG tablet Take 1,000 mcg by mouth daily.     Review of Systems      All other systems reviewed and are otherwise negative except as noted above.  Physical Exam    VS:  BP 120/70   Pulse 68   Ht '5\' 3"'$  (1.6 m)   Wt 171 lb 9.6 oz (77.8 kg)   LMP  (LMP Unknown)   SpO2 95%   BMI 30.40 kg/m  , BMI Body mass index is 30.4 kg/m.  Wt Readings from Last 3 Encounters:  01/08/22 171 lb 9.6 oz (77.8 kg)  01/02/22 170 lb 8 oz (77.3 kg)  10/30/21 177 lb (80.3 kg)     GEN: skin on torso, arms, neck, scalp, and legs with a maculopapular bright red rash that is raised and  HEENT: normal. Neck: Supple, no JVD, carotid bruits, or masses. Cardiac: RRR, no murmurs, rubs, or gallops. No clubbing, cyanosis, edema.  Radials/PT 2+ and equal bilaterally.  Respiratory:  Respirations regular and unlabored, clear to auscultation bilaterally. GI: Soft, nontender, nondistended. MS: No deformity or atrophy. Skin: Warm and dry, no rash. Neuro:  Strength and sensation are intact. Psych: Normal affect.  Assessment & Plan    Drug rash -change Eliquis to Xarelto -change dilt to atenolol -solumedral dose  pak -around the clock benadryl -2.5% hydrocortisone cream to affected area -please call Monday and keep [place] updated -if worsens over the weekend will need to go to ER or urgent care  A-fib with RVR, new diagnosis/history of PVC, PAC, NSVT -s/p DCCV -NSR -changed to xarelto and atenolol due to drug reaction  Nonobstructive CAD -asymptomatic at this time -continue current medication regimen  Chronic diastolic heart failure/history of aortic stenosis status post TAVR -euvolemic on exam -continue current medications  Aortic atherosclerosis -LDL 181 -LDL goal < 70 -looks like she has an intolerance to statins -need to recheck a lipid panel at her next visit and possible discuss injectables       Disposition: Follow up 1 month with Lauree Chandler, MD or APP.  Signed, Elgie Collard, PA-C 01/08/2022, 4:47 PM  [facility]

## 2022-01-07 NOTE — Telephone Encounter (Signed)
Pt was given iv cardizem and then converted to po.  Will route to PharmD for input

## 2022-01-08 ENCOUNTER — Ambulatory Visit: Payer: Medicare Other | Attending: Physician Assistant | Admitting: Physician Assistant

## 2022-01-08 ENCOUNTER — Encounter: Payer: Self-pay | Admitting: Physician Assistant

## 2022-01-08 VITALS — BP 120/70 | HR 68 | Ht 63.0 in | Wt 171.6 lb

## 2022-01-08 DIAGNOSIS — I2510 Atherosclerotic heart disease of native coronary artery without angina pectoris: Secondary | ICD-10-CM | POA: Diagnosis not present

## 2022-01-08 DIAGNOSIS — Z952 Presence of prosthetic heart valve: Secondary | ICD-10-CM | POA: Insufficient documentation

## 2022-01-08 DIAGNOSIS — E039 Hypothyroidism, unspecified: Secondary | ICD-10-CM | POA: Diagnosis not present

## 2022-01-08 DIAGNOSIS — I700 Atherosclerosis of aorta: Secondary | ICD-10-CM

## 2022-01-08 DIAGNOSIS — I4891 Unspecified atrial fibrillation: Secondary | ICD-10-CM | POA: Insufficient documentation

## 2022-01-08 MED ORDER — ATENOLOL 25 MG PO TABS: 25.0000 mg | ORAL_TABLET | Freq: Every day | ORAL | 3 refills | Status: DC

## 2022-01-08 MED ORDER — RIVAROXABAN 15 MG PO TABS: 15.0000 mg | ORAL_TABLET | Freq: Every day | ORAL | 3 refills | Status: DC

## 2022-01-08 MED ORDER — METHYLPREDNISOLONE 4 MG PO TBPK: ORAL_TABLET | ORAL | 0 refills | Status: DC

## 2022-01-08 MED ORDER — HYDROCORTISONE 2.5 % EX CREA: TOPICAL_CREAM | Freq: Every day | CUTANEOUS | 0 refills | Status: DC

## 2022-01-08 NOTE — Telephone Encounter (Signed)
Both Eliquis and diltiazem were newly started when pt was admitted last month for new onset afib.  Either med can cause rash, reported at < 1% incidence with Eliquis and 1-2% with diltiazem.  Pt has appt later today with Tessa and can be discussed at that time after rash is seen in person. Drug rashes are usually diffuse and cover the trunk of the body. If med change is needed, would probably prefer to change diltiazem first and avoid changing anticoag within the first 30 days after her cardioversion. If rash doesn't improve with stopping dilt, could try changing DOAC outside of the 30 day window.

## 2022-01-08 NOTE — Patient Instructions (Signed)
Medication Instructions:  1.Stop Diltiazem 2.Start Atenolol 25 mg daily 3.Stop Eliquis 4.Start Xarelto 15 mg daily with the largest meal of the day. Take first dose tonight. 5.Start medrol dose pack 6.Take Benadryl every 6 hours 7.Use 2.5 % hydrocortisone cream daily to the affected area, apply after you shower *If you need a refill on your cardiac medications before your next appointment, please call your pharmacy*   Lab Work: None ordered If you have labs (blood work) drawn today and your tests are completely normal, you will receive your results only by: [place] (if you have MyChart) OR A paper copy in the mail If you have any lab test that is abnormal or we need to change your treatment, we will call you to review the results.   Follow-Up: At [facility] you and your health needs are our priority.  As part of our continuing mission to provide you with exceptional heart care, we have created designated Provider Care Teams.  These Care Teams include your primary Cardiologist (physician) and Advanced Practice Providers (APPs -  Physician Assistants and Nurse Practitioners) who all work together to provide you with the care you need, when you need it.   Your next appointment:   1 month(s)  The format for your next appointment:   In Person  Provider:   Lauree Chandler, MD  or APP  Other Instructions 1.If your rash becomes worse over the weekend, please report to urgent care 2.Call our office on Monday to provide [place] with an update on how you are doing  Important Information About [place]

## 2022-01-22 ENCOUNTER — Telehealth: Payer: Self-pay | Admitting: Cardiovascular Disease

## 2022-01-22 NOTE — Telephone Encounter (Signed)
Patient's daughter would like for Dr. Angelena Form or nurse to give her a call

## 2022-01-22 NOTE — Telephone Encounter (Signed)
Patient had hospital follow up on 01/08/22 with APP.  Her Eliquis was changed to Xarelto and cardizem was changed to atenolol.  She is doing well and rash has resolved.  Her daughter would like to know if she really needs to follow up in February.  If not, they would like to cancel.  She has her scheduled follow up with Dr. Angelena Form on 05/03/22.  I told Beth that I would review with Dr. Angelena Form and give her a call back.

## 2022-01-26 NOTE — Telephone Encounter (Signed)
Called the patient's daughter and let her know Feb appointment is not needed.  Appointment cancelled.

## 2022-02-01 DIAGNOSIS — R059 Cough, unspecified: Secondary | ICD-10-CM | POA: Diagnosis not present

## 2022-02-01 DIAGNOSIS — J9801 Acute bronchospasm: Secondary | ICD-10-CM | POA: Diagnosis not present

## 2022-02-01 DIAGNOSIS — J069 Acute upper respiratory infection, unspecified: Secondary | ICD-10-CM | POA: Diagnosis not present

## 2022-02-09 ENCOUNTER — Ambulatory Visit: Payer: Medicare Other | Admitting: Physician Assistant

## 2022-03-10 DIAGNOSIS — M1712 Unilateral primary osteoarthritis, left knee: Secondary | ICD-10-CM | POA: Diagnosis not present

## 2022-03-22 ENCOUNTER — Other Ambulatory Visit: Payer: Self-pay

## 2022-03-22 MED ORDER — FUROSEMIDE 20 MG PO TABS: 40.0000 mg | ORAL_TABLET | Freq: Every day | ORAL | 3 refills | Status: DC

## 2022-04-14 ENCOUNTER — Other Ambulatory Visit: Payer: Self-pay | Admitting: Cardiovascular Disease

## 2022-04-27 ENCOUNTER — Telehealth: Payer: Self-pay | Admitting: Cardiovascular Disease

## 2022-04-27 NOTE — Telephone Encounter (Signed)
Pt would like a callback today in regards to pain in her neck after waking up from a nap. Please advise

## 2022-04-27 NOTE — Telephone Encounter (Signed)
Called and spoke w the patient.  The soreness in her neck is gone.  It lasted a short while after she woke from taking a nap this afternoon.  It got her thinking that she might be due for carotid artery ultrasound.  The area is fine now.  Not tender to touch.  She has no speech or hearing deficits and no weakness.  She is scheduled for her annual follow up with Dr. Clifton James on 05/03/22.

## 2022-05-02 NOTE — Progress Notes (Signed)
Chief Complaint  Patient presents with   Follow-up    CAD, atrial fib   History of Present Illness: 89 yo female with history of CAD, HLD, SVT, paroxysmal atrial fibrillation, breast cancer, chronic diastolic CHF, bilateral carotid artery disease and severe aortic valve stenosis s/p TAVR who is here today for cardiac follow up.  Cardiac cath in July 2017 with mild plaque in the LAD. She underwent TAVR on 07/27/16 with placement of a 26 mm Edwards Sapien 3 bioprosthetic AVR from the right femoral artery. Post operative echo showed a normally functioning AVR. She had been followed in the past for her CAD and AS by Dr. Mayford Knife. She was seen in our office September 2020 by Tereso Newcomer, PA-C with volume overload that responded quickly to Lasix therapy. Echo September 2020 with LVEF=55-60%, mild LVH, normally functioning bioprosthetic AVR with mean gradient 17 mmHg (13 mmHg post immediately post implant), trace MR. Carotid artery dopplers November 2022 with mild to moderate bilateral carotid artery disease. I saw her in October 2023 and she c/o palpitations and weight gain. Echo 11/13/21 with LVEF=55-60%. AVR working well.  Cardiac monitor November 2023 with runs of SVT. She was admitted to [facility] December  2023 with atrial fib with RVR and underwent TEE guided cardioversion and was discharged on Cardizem and Eliquis but had an allergic reaction and was changed to atenolol and Xarelto.   She is here today for follow up. The patient denies any chest pain, dyspnea, palpitations, lower extremity edema, orthopnea, PND, dizziness, near syncope or syncope.    Primary Care Physician: Blair Heys, MD  Past Medical History:  Diagnosis Date   Arthritis    hands, knees   CAD (coronary artery disease), native coronary artery cardiologist--- dr Clifton James   a. mild non obst CAD per cath 07-18-2015   Carotid artery disease (HCC)    Carotid US 09/2018: Bilateral ICA 40-59 // Carotid US 11/21: Bilateral ICA 40-59;  repeat 1 year    Chronic diastolic CHF (congestive heart failure) (HCC)    followed by cardiology   CKD (chronic kidney disease), stage III (HCC)    followed by pcp   Dyspnea    per pt when she walks which is usual for her   History of gastric ulcer    remote yrs ago   History of rheumatic fever as a child    History of right breast cancer 2006   s/p right partial mastectomy w/ node dissection's,  completed chemo/ radiation same year  (05-21-2020 pt stated no recurrence)   Hyperlipidemia    Hypothyroidism    followed by pcp   Osteoporosis    PMB (postmenopausal bleeding)    PSVT (paroxysmal supraventricular tachycardia) 10/2012   in ED resolved w/ vagal maneuver    S/P aortic valve replacement 07/27/2016   for severe stenosis;  last echo in epic 09-22-2018 ef 55-60%, mild TR, aortic valve area 1.4cm^2 and mean gradiant 17 mmHg   Seasonal allergies    Vitamin D deficiency     Past Surgical History:  Procedure Laterality Date   CARDIAC CATHETERIZATION N/A 07/18/2015   Procedure: Right/Left Heart Cath and Coronary Angiography;  Surgeon: Kathleene Hazel, MD;  Location: [facility] INVASIVE CV LAB;  Service: Cardiovascular;  Laterality: N/A;   CARDIOVERSION N/A 01/01/2022   Procedure: CARDIOVERSION;  Surgeon: Meriam Sprague, MD;  Location: [facility] ENDOSCOPY;  Service: Cardiovascular;  Laterality: N/A;   CATARACT EXTRACTION W/ INTRAOCULAR LENS  IMPLANT, BILATERAL     2017  CHOLECYSTECTOMY N/A 08/06/2014   Procedure: LAPAROSCOPIC CHOLECYSTECTOMY ;  Surgeon: Emelia Loron, MD;  Location: [facility] OR;  Service: General;  Laterality: N/A;   HYSTEROSCOPY WITH D & C N/A 05/26/2020   Procedure: DILATATION AND CURETTAGE /HYSTEROSCOPY;  Surgeon: Romualdo Bolk, MD;  Location: [facility];  Service: Gynecology;  Laterality: N/A;  Request case for Cate   PARTIAL MASTECTOMY WITH AXILLARY SENTINEL LYMPH NODE BIOPSY Right 2006   TEE WITHOUT CARDIOVERSION N/A 07/27/2016   Procedure:  TRANSESOPHAGEAL ECHOCARDIOGRAM (TEE);  Surgeon: Kathleene Hazel, MD;  Location: [facility] OR;  Service: Open Heart Surgery;  Laterality: N/A;   TEE WITHOUT CARDIOVERSION N/A 01/01/2022   Procedure: TRANSESOPHAGEAL ECHOCARDIOGRAM (TEE);  Surgeon: Meriam Sprague, MD;  Location: [facility] ENDOSCOPY;  Service: Cardiovascular;  Laterality: N/A;   THYROID SURGERY  1969   NODULE REMOVED, benign per pt   TONSILLECTOMY  age 41   TOTAL HIP ARTHROPLASTY Left 2003   TOTAL HIP ARTHROPLASTY Right 08/28/2013   Procedure: RIGHT TOTAL HIP ARTHROPLASTY ANTERIOR APPROACH;  Surgeon: Shelda Pal, MD;  Location: [facility] ORS;  Service: Orthopedics;  Laterality: Right;   TOTAL KNEE ARTHROPLASTY Right 01-16-2007  @[facility]    TRANSCATHETER AORTIC VALVE REPLACEMENT, TRANSFEMORAL N/A 07/27/2016   Procedure: TRANSCATHETER AORTIC VALVE REPLACEMENT, TRANSFEMORAL;  Surgeon: Kathleene Hazel, MD;  Location: [facility] OR;  Service: Open Heart Surgery;  Laterality: N/A;    Current Outpatient Medications  Medication Sig Dispense Refill   acetaminophen (TYLENOL) 650 MG CR tablet Take 650 mg by mouth daily.     atenolol (TENORMIN) 25 MG tablet Take 1 tablet (25 mg total) by mouth daily. 90 tablet 3   Calcium Carbonate-Vitamin D (CALCIUM 600/VITAMIN D PO) Take 1 tablet by mouth daily.     dextromethorphan (DELSYM) 30 MG/5ML liquid Take 30 mg by mouth at bedtime as needed for cough.     ergocalciferol (VITAMIN D2) 1.25 MG (50000 UT) capsule Take 50,000 Units by mouth once a week. Saturday's     fexofenadine (QC FEXOFENADINE HYDROCHLORIDE) 180 MG tablet Take 180 mg by mouth daily.     furosemide (LASIX) 20 MG tablet Take 2 tablets (40 mg total) by mouth daily. 180 tablet 3   hydrocortisone 2.5 % cream Apply topically daily. 30 g 0   levothyroxine (SYNTHROID, LEVOTHROID) 112 MCG tablet Take 112 mcg by mouth daily before breakfast.     Magnesium 400 MG CAPS Take 400 mg by mouth daily.      Menthol, Topical Analgesic, (BIOFREEZE EX) Apply 1  application topically as needed (pain).      methylPREDNISolone (MEDROL DOSEPAK) 4 MG TBPK tablet Use as directed 21 tablet 0   Multiple Vitamin (MULTIVITAMIN WITH MINERALS) TABS tablet Take 1 tablet by mouth daily.     potassium chloride (KLOR-CON M) 10 MEQ tablet TAKE 1 TABLET BY MOUTH DAILY WITH LAXIS AS NEEDED 30 tablet 7   Rivaroxaban (XARELTO) 15 MG TABS tablet Take 1 tablet (15 mg total) by mouth daily with supper. 90 tablet 3   vitamin C (ASCORBIC ACID) 500 MG tablet Take 500 mg by mouth daily.     No current facility-administered medications for this visit.    Allergies  Allergen Reactions   Atorvastatin Other (See Comments)    Myalgias    Compazine [Prochlorperazine] Other (See Comments)    Unknown, pt does not remember   Floxin [Ofloxacin] Other (See Comments)    Unknown, pt does not remember reaction but was allergic reaction   Livalo [Pitavastatin]  Other (See Comments)    Leg pain   Pravastatin Sodium Other (See Comments)    mylgias    Simvastatin Other (See Comments)    Elevated lft's 06/2011   Fenofibrate Rash    Rash     Social History   Socioeconomic History   Marital status: Widowed    Spouse name: Not on file   Number of children: Not on file   Years of education: Not on file   Highest education level: Not on file  Occupational History   Not on file  Tobacco Use   Smoking status: Never   Smokeless tobacco: Never  Vaping Use   Vaping Use: Never used  Substance and Sexual Activity   Alcohol use: No   Drug use: Never   Sexual activity: Not Currently    Birth control/protection: Post-menopausal  Other Topics Concern   Not on file  Social History Narrative   Not on file   Social Determinants of Health   Financial Resource Strain: Not on file  Food Insecurity: No Food Insecurity (12/30/2021)   Hunger Vital Sign    Worried About Running Out of Food in the Last Year: Never true    Ran Out of Food in the Last Year: Never true  Transportation Needs:  No Transportation Needs (12/30/2021)   PRAPARE - Administrator, Civil Service (Medical): No    Lack of Transportation (Non-Medical): No  Physical Activity: Not on file  Stress: Not on file  Social Connections: Not on file  Intimate Partner Violence: Not At Risk (12/30/2021)   Humiliation, Afraid, Rape, and Kick questionnaire    Fear of Current or Ex-Partner: No    Emotionally Abused: No    Physically Abused: No    Sexually Abused: No    Family History  Problem Relation Age of Onset   Heart disease Mother    Heart disease Father     Review of Systems:  As stated in the HPI and otherwise negative.   BP 134/82 (BP Location: Left Arm, Patient Position: Sitting, Cuff Size: Normal)   Pulse 65   Ht 5\' 3"  (1.6 m)   Wt 77.8 kg   LMP  (LMP Unknown)   SpO2 96%   BMI 30.40 kg/m   Physical Examination: General: Well developed, well nourished, NAD  HEENT: OP clear, mucus membranes moist  SKIN: warm, dry. No rashes. Neuro: No focal deficits  Musculoskeletal: Muscle strength 5/5 all ext  Psychiatric: Mood and affect normal  Neck: No JVD, no carotid bruits, no thyromegaly, no lymphadenopathy.  Lungs:Clear bilaterally, no wheezes, rhonci, crackles Cardiovascular: Regular rate and rhythm. No murmurs, gallops or rubs. Abdomen:Soft. Bowel sounds present. Non-tender.  Extremities: No lower extremity edema. Pulses are 2 + in the bilateral DP/PT.  Echo October 2023:   1. Left ventricular ejection fraction, by estimation, is 55 to 60%. The  left ventricle has normal function. The left ventricle has no regional  wall motion abnormalities. Left ventricular diastolic parameters are  consistent with Grade II diastolic  dysfunction (pseudonormalization).   2. Right ventricular systolic function is normal. The right ventricular  size is normal. Tricuspid regurgitation signal is inadequate for assessing  PA pressure.   3. Left atrial size was moderately dilated.   4. The mitral  valve is degenerative. No evidence of mitral valve  regurgitation.   5. S/p 26 mm Sapien Aortic Prosthesis. Date 07/27/2016. No PVL. AV max 2.1  m/s. DI 0.44. Mean gradient 10 mmHg (improved from  prior study 09/22/2018)  Normal Prosthesis. Aortic valve regurgitation is not visualized.   6. The inferior vena cava is normal in size with greater than 50%  respiratory variability, suggesting right atrial pressure of 3 mmHg.    EKG:  EKG is not ordered today. The ekg ordered today demonstrates   Recent Labs: 12/30/2021: TSH 3.975 12/31/2021: B Natriuretic Peptide 338.8; Hemoglobin 13.3; Magnesium 2.3; Platelets 206 01/01/2022: BUN 20; Creatinine, Ser 1.06; Potassium 4.0; Sodium 134   Lipid Panel No results found for: "CHOL", "TRIG", "HDL", "CHOLHDL", "VLDL", "LDLCALC", "LDLDIRECT"   Wt Readings from Last 3 Encounters:  05/03/22 77.8 kg  01/08/22 77.8 kg  01/02/22 77.3 kg    Assessment and Plan:   1. Severe aortic valve stenosis: She underwent TAVR with placement of a 26 mm Sapien 3 valve from the right transfemoral approach in July 2018. Her valve was working well by echo in October 2023. Will continue ASA and SBE prophylaxis.   2. CAD without angina: She is known to have mild CAD by cath in 2017. No chest pain suggestive of angina. Continue ASA. She is statin intolerant.  3. Atrial fibrillation, paroxysmal: s/p DCCV December 2023. Sinus today. Continue atenolol and Xarelto.   4. Chronic diastolic CHF: Volume status ok today. Weight is stable Continue Lasix 40 mg per day and take extra lasix as needed.   5. Carotid artery disease: Mild to moderate bilateral carotid artery disease by dopplers November 2022. Repeat November 2024  Labs/ tests ordered today include:   No orders of the defined types were placed in this encounter.  Disposition:   F/U with me in 6 months  Signed, Verne Carrow, MD 05/03/2022 11:07 AM    [facility] [address] [place],  [place], [place]  [address] Phone: [phone]; Fax: [phone]

## 2022-05-03 ENCOUNTER — Ambulatory Visit: Payer: Medicare Other | Attending: Cardiovascular Disease | Admitting: Cardiovascular Disease

## 2022-05-03 VITALS — BP 134/82 | HR 65 | Ht 63.0 in | Wt 171.6 lb

## 2022-05-03 DIAGNOSIS — I5032 Chronic diastolic (congestive) heart failure: Secondary | ICD-10-CM | POA: Insufficient documentation

## 2022-05-03 DIAGNOSIS — I2510 Atherosclerotic heart disease of native coronary artery without angina pectoris: Secondary | ICD-10-CM | POA: Insufficient documentation

## 2022-05-03 DIAGNOSIS — I779 Disorder of arteries and arterioles, unspecified: Secondary | ICD-10-CM | POA: Diagnosis not present

## 2022-05-03 DIAGNOSIS — Z952 Presence of prosthetic heart valve: Secondary | ICD-10-CM | POA: Diagnosis not present

## 2022-05-03 DIAGNOSIS — I480 Paroxysmal atrial fibrillation: Secondary | ICD-10-CM | POA: Insufficient documentation

## 2022-05-03 DIAGNOSIS — I359 Nonrheumatic aortic valve disorder, unspecified: Secondary | ICD-10-CM | POA: Diagnosis not present

## 2022-05-03 NOTE — Patient Instructions (Signed)
Medication Instructions:  No changes *If you need a refill on your cardiac medications before your next appointment, please call your pharmacy*   Lab Work: none If you have labs (blood work) drawn today and your tests are completely normal, you will receive your results only by: MyChart Message (if you have MyChart) OR A paper copy in the mail If you have any lab test that is abnormal or we need to change your treatment, we will call you to review the results.   Testing/Procedures: none   Follow-Up: At [facility], you and your health needs are our priority.  As part of our continuing mission to provide you with exceptional heart care, we have created designated Provider Care Teams.  These Care Teams include your primary Cardiologist (physician) and Advanced Practice Providers (APPs -  Physician Assistants and Nurse Practitioners) who all work together to provide you with the care you need, when you need it.   Your next appointment:   6 month(s)  Provider:   Jacolyn Reedy, PA-C,  Tereso Newcomer, PA-C Jari Favre, PA-C

## 2022-06-09 DIAGNOSIS — M1712 Unilateral primary osteoarthritis, left knee: Secondary | ICD-10-CM | POA: Diagnosis not present

## 2022-06-28 ENCOUNTER — Telehealth: Payer: Self-pay | Admitting: Cardiovascular Disease

## 2022-06-28 NOTE — Telephone Encounter (Signed)
Returned call to patient to discuss symptoms of palpitations (hx of PAF) and verify medication use of Atenolol and Xarelto. No answer, left message asking that she call us back. Also due for 51m follow-up in October that's not currently scheduled, will arrange that upon return call. Routing to primary RN.

## 2022-06-28 NOTE — Telephone Encounter (Signed)
Patient c/o Palpitations:  High priority if patient c/o lightheadedness, shortness of breath, or chest pain  How long have you had palpitations/irregular HR/ Afib? Are you having the symptoms now?  Patient states her watch has been alerting her that she has been in afib since Thursday. No afib last night or this morning. She states she has not had any symptoms at all.  Are you currently experiencing lightheadedness, SOB or CP?  No  Do you have a history of afib (atrial fibrillation) or irregular heart rhythm?  Yes   Have you checked your BP or HR? (document readings if available):  No   Are you experiencing any other symptoms?  No

## 2022-06-28 NOTE — Telephone Encounter (Signed)
Patient returns call to office who states that her BP kit alerted her that she was in A-fib last Thursday and has been since. She cannot feel herself go in and out of rhythm-her watch has been alerting her each time she has an episode. She records 2 episodes Thursday & Friday, 4 episodes/alerts on Saturday, and 1 alert on Sunday. She did have 1 alert at 9am this morning. States she has been at home all weekend and hasn't done anything out of the ordinary. BP has been good 120's/70's all weekend. Denies chest pain, shortness of breath, vision changes, but does condone generalized weakness. Confirms she is taking Atenolol and Xarelto-no missed doses. Educated her that PAF will come and go on it's own-there's no cure, only treatment, and she's on the correct medication regimen for it. I reiterated that as long as she is not having symptoms, she is safe, doesn't need to call us each time. She felt much better and will let us know if the nature of what she's feeling changes in any way. Told her I would still route to MD & RN as Lorain Childes and that potentially, we could increase dose of her beta blocker (atenolol) if she continues to have a lot of episodes.

## 2022-06-28 NOTE — Telephone Encounter (Signed)
Patient is calling back to get update on message she had left earlier. Requesting call back.   BP readings:  127/73 123/71

## 2022-07-12 ENCOUNTER — Telehealth: Payer: Self-pay | Admitting: Cardiovascular Disease

## 2022-07-12 MED ORDER — AMOXICILLIN 500 MG PO CAPS: 2000.0000 mg | ORAL_CAPSULE | Freq: Once | ORAL | 0 refills | Status: AC

## 2022-07-12 NOTE — Telephone Encounter (Signed)
   Pre-operative Risk Assessment    Patient Name: [name]  DOB: [birth year] MRN: [id number]     Request for Surgical Clearance    Procedure:  Dental Extraction - Amount of Teeth to be Pulled:  1  Date of Surgery:  Clearance 07/13/22                                 Surgeon:  Dr. Elder Cyphers  Surgeon's Group or Practice Name:  [facility]  Phone number:  [phone]  Fax number:  [phone]   Type of Clearance Requested:   - Medical  - Pharmacy:  Hold Rivaroxaban (Xarelto)     Type of Anesthesia:  Local    Additional requests/questions:   she asked if Preop CMA can call her prior to so she can let the pt know.   Alben Spittle   07/12/2022, 9:13 AM

## 2022-07-12 NOTE — Telephone Encounter (Signed)
   Patient Name: [name]  DOB: 13-May-1931 MRN: [id number]  Primary Cardiologist: Verne Carrow, MD  Chart reviewed as part of pre-operative protocol coverage.   Dental extractions of 1-2 teeth are considered low risk procedures per guidelines and generally do not require any specific cardiac clearance. It is also generally accepted that for extractions of 1-2 teeth and dental cleanings, there is no need to interrupt blood thinner therapy.  SBE prophylaxis is required for the patient from a cardiac standpoint. Rx sent to local pharmacy.   I will route this recommendation to the requesting party via Epic fax function and remove from pre-op pool.  Please call with questions.  Carlos Levering, NP 07/12/2022, 1:03 PM

## 2022-07-12 NOTE — Telephone Encounter (Signed)
Spoke to patient and make her aware about the antibiotic and instructions.  Pt verbalized understanding and thanked me for the call.

## 2022-07-16 ENCOUNTER — Telehealth: Payer: Self-pay | Admitting: Cardiovascular Disease

## 2022-07-16 DIAGNOSIS — R609 Edema, unspecified: Secondary | ICD-10-CM | POA: Diagnosis not present

## 2022-07-16 DIAGNOSIS — I480 Paroxysmal atrial fibrillation: Secondary | ICD-10-CM | POA: Diagnosis not present

## 2022-07-16 NOTE — Telephone Encounter (Signed)
Spoke with the patient who states that her feet and ankles are swollen. She reports more swelling in the left ankle and foot. She also states that her foot/ankle are blue on the left side. She states that she is not having any pain in that foot. She denies any numbness or tingling. She can move her toes fine and walk on her foot with no issues. Foot is not cold to touch. She is elevating her legs. She is unsure of any weight gain but reports some mild shortness of breath. Advised that she can take an extra lasix.  She also states that she gets injections in her left knee and she has already reached out to her orthopedist about her foot concerns.

## 2022-07-16 NOTE — Telephone Encounter (Signed)
Pt c/o swelling: STAT is pt has developed SOB within 24 hours  If swelling, where is the swelling located? Ankles and feet  How much weight have you gained and in what time span? NA  Have you gained 3 pounds in a day or 5 pounds in a week? NA  Do you have a log of your daily weights (if so, list)? NA  Are you currently taking a fluid pill? Yes  Are you currently SOB? No  Have you traveled recently? No  Pt states that her feet and ankles have been swollen for about a week and a half. Pt says that now her feet and toes have began to turn blue. She would like a callback regarding this matter. Please advise

## 2022-07-26 ENCOUNTER — Telehealth: Payer: Self-pay | Admitting: Cardiovascular Disease

## 2022-07-26 NOTE — Telephone Encounter (Signed)
Pt c/o Shortness Of Breath: STAT if SOB developed within the last 24 hours or pt is noticeably SOB on the phone  1. Are you currently SOB (can you hear that pt is SOB on the phone)?  No   2. How long have you been experiencing SOB?  About 3-4 days   3. Are you SOB when sitting or when up moving around?  Both   4. Are you currently experiencing any other symptoms?  Leg swelling + weight increase over the past week, afib

## 2022-07-26 NOTE — Telephone Encounter (Signed)
Patient reports weight as been going up for last week with increased SOB and swelling in ankles. She states her PCP increased  Lasix last week to 40 mg in the morning and 20 mg in the afternoon. Patient reports weight increase from 166 to 173 total since last week.   Reviewed with Dr Clifton James, advised patient to increase Lasix to 40 mg BID and increase potassium to 10 mEq BID for one week.  Advised patient if symptoms continue to worsen, weight continues to increase after 3 days, to let us know. Advised if symptoms start to improve and weight starts to decrease to let us know how she is doing in 1 week.  Provided education on daily weights and keeping a record. Patient verbalized understanding and had no questions.

## 2022-07-29 ENCOUNTER — Telehealth: Payer: Self-pay | Admitting: Cardiovascular Disease

## 2022-07-29 DIAGNOSIS — I5032 Chronic diastolic (congestive) heart failure: Secondary | ICD-10-CM

## 2022-07-29 MED ORDER — POTASSIUM CHLORIDE CRYS ER 10 MEQ PO TBCR: 10.0000 meq | EXTENDED_RELEASE_TABLET | Freq: Two times a day (BID) | ORAL | 2 refills | Status: DC

## 2022-07-29 MED ORDER — FUROSEMIDE 20 MG PO TABS: 40.0000 mg | ORAL_TABLET | Freq: Two times a day (BID) | ORAL | 3 refills | Status: DC

## 2022-07-29 NOTE — Telephone Encounter (Signed)
Returned call to patient.  She has been taking lasix 40 mg twice a day and potassium 10 mg twice a day for the last 3 days and is feeling good.  Her swelling has improved quite a bit.  She is going to stay on this dose.   She is coming for labs on Tue 7/30. (BMET).    Updated medication list and made lab appointment.

## 2022-07-29 NOTE — Telephone Encounter (Signed)
Left voicemail to return call to office.

## 2022-07-29 NOTE — Telephone Encounter (Signed)
Pt states the lasix is working and swelling is going down. Pt states she has lost a lb or so. Please advise.

## 2022-07-29 NOTE — Telephone Encounter (Signed)
Pt returning nurses phone call. Please advise ?

## 2022-08-03 ENCOUNTER — Ambulatory Visit: Payer: Medicare Other | Attending: Cardiovascular Disease

## 2022-08-03 DIAGNOSIS — I5032 Chronic diastolic (congestive) heart failure: Secondary | ICD-10-CM | POA: Diagnosis not present

## 2022-08-03 LAB — BASIC METABOLIC PANEL
BUN/Creatinine Ratio: 21 (ref 12–28)
BUN: 23 mg/dL (ref 8–27)
CO2: 28 mmol/L (ref 20–29)
Calcium: 9.8 mg/dL (ref 8.7–10.3)
Chloride: 95 mmol/L — ABNORMAL LOW (ref 96–106)
Creatinine, Ser: 1.08 mg/dL — ABNORMAL HIGH (ref 0.57–1.00)
Glucose: 104 mg/dL — ABNORMAL HIGH (ref 70–99)
Potassium: 3.9 mmol/L (ref 3.5–5.2)
Sodium: 137 mmol/L (ref 134–144)
eGFR: 49 mL/min/{1.73_m2} — ABNORMAL LOW (ref >59)

## 2022-08-11 ENCOUNTER — Telehealth: Payer: Self-pay | Admitting: Cardiovascular Disease

## 2022-08-11 NOTE — Telephone Encounter (Signed)
Pt c/o Shortness Of Breath: STAT if SOB developed within the last 24 hours or pt is noticeably SOB on the phone  1. Are you currently SOB (can you hear that pt is SOB on the phone)? No  2. How long have you been experiencing SOB? A few days   3. Are you SOB when sitting or when up moving around? Sitting   4. Are you currently experiencing any other symptoms? No

## 2022-08-11 NOTE — Telephone Encounter (Signed)
Pt returning nurse's call. Please advise

## 2022-08-11 NOTE — Telephone Encounter (Signed)
Spoke w the patient.  She is reported continued shortness of breath.  This happens whether at rest in a chair or moving around.  It bothers her most when she is lying down at night.  It did not get better with the recent increase in furosemide.  Her weight has not gone up and she gets swelling throughout the day that resolves by morning.  She props her feet when sitting.  Trying to minimize sodium.    Her next appointment is in Oct w APP.  She wonders if diuretic should be increased further, since breathing does not seem better.  She is aware we will check w Dr. Clifton James for recommendations and will call her back.    She is currently taking lasix 40 mg BID and potassium 10 meq BID. I added her to DOD schedule Monday in case needed but can cancel depending on MD recommendations.

## 2022-08-12 NOTE — Telephone Encounter (Signed)
Janice Hazel, MD  Lendon Ka, RN  We can try lasix 80 mg am and 40 mg pm for 3-4 days and see if this helps? I would double the KDur to 40 meq per day. Thanks, chris ________________________________________________________    I called the patient w Dr. Gibson Ramp recommendations.  She is feeling completely better today. She slept all night without any shortness of breath.  Her weight is down 2 pounds today.  No swelling.  She is going to stay on her current regimen instead of making the recommended changes above for now.  She will continue to stay on track w her diet.  I asked her to call if symptoms worsen again and we can adjust her fluid pills for a few days as above.  Pt grateful for assitance.

## 2022-08-16 ENCOUNTER — Ambulatory Visit: Payer: Medicare Other | Admitting: Cardiovascular Disease

## 2022-08-19 DIAGNOSIS — H52203 Unspecified astigmatism, bilateral: Secondary | ICD-10-CM | POA: Diagnosis not present

## 2022-08-19 DIAGNOSIS — Z961 Presence of intraocular lens: Secondary | ICD-10-CM | POA: Diagnosis not present

## 2022-08-19 DIAGNOSIS — D3132 Benign neoplasm of left choroid: Secondary | ICD-10-CM | POA: Diagnosis not present

## 2022-08-20 ENCOUNTER — Telehealth: Payer: Self-pay | Admitting: Cardiovascular Disease

## 2022-08-20 NOTE — Telephone Encounter (Signed)
Pt would like a callback from nurse as she was told to callback to give nurse update on how she is doing. Pleas advise   ( See note from 8/7)

## 2022-08-20 NOTE — Telephone Encounter (Signed)
Per pt first part of the week felt good yesterday and today feels lightheaded and SOB Per pt more SOB . Previous phone note has pt changing to Furosemide 80 mg am and 40 mg in pm and Kdur 20 meq bid  for 3-4 days  Pt to start tom and call next week with update .Zack Seal

## 2022-08-23 NOTE — Telephone Encounter (Signed)
STAT if patient feels like he/she is going to faint   Are you dizzy now?  Yes, patient is following up requesting feedback because she still feels dizzy  Do you feel faint or have you passed out?  No   Do you have any other symptoms?  SOB   Have you checked your HR and BP (record if available)?  HR got up to 110 a few minutes ago. Patient states she has not checked her BP

## 2022-08-23 NOTE — Telephone Encounter (Signed)
Spoke with the patient who states that she has been feeling dizzy and weak. She reports that her heart rate was up at 115. She reports that she was told last week to increase her lasix for several days which she has been doing. She states that after she takes her medications in the mornings she will get a little light headed but it usually resolves shortly after. She states that today the lightheadedness has continued. She does feel like she is in AFIB. She did take her atenolol this morning. She reports blood pressure 168/96. She reports that she did have some breakfast this morning and coffee. Has not had any water yet today. Advised patient to relax and drink some water. Will see if AFIB clinic has any openings this week for an appointment.

## 2022-08-24 ENCOUNTER — Encounter (HOSPITAL_COMMUNITY): Payer: Self-pay | Admitting: Internal Medicine

## 2022-08-24 ENCOUNTER — Ambulatory Visit (HOSPITAL_COMMUNITY)
Admission: RE | Admit: 2022-08-24 | Discharge: 2022-08-24 | Disposition: A | Discharge status: Home / Self Care | Payer: Medicare Other | Source: Ambulatory Visit | Attending: Internal Medicine | Admitting: Internal Medicine

## 2022-08-24 VITALS — BP 100/70 | HR 104 | Ht 63.0 in | Wt 173.8 lb

## 2022-08-24 DIAGNOSIS — I480 Paroxysmal atrial fibrillation: Secondary | ICD-10-CM | POA: Insufficient documentation

## 2022-08-24 DIAGNOSIS — Z7901 Long term (current) use of anticoagulants: Secondary | ICD-10-CM | POA: Diagnosis not present

## 2022-08-24 DIAGNOSIS — I110 Hypertensive heart disease with heart failure: Secondary | ICD-10-CM | POA: Diagnosis not present

## 2022-08-24 DIAGNOSIS — I4819 Other persistent atrial fibrillation: Secondary | ICD-10-CM | POA: Insufficient documentation

## 2022-08-24 DIAGNOSIS — I5032 Chronic diastolic (congestive) heart failure: Secondary | ICD-10-CM | POA: Diagnosis not present

## 2022-08-24 DIAGNOSIS — D6869 Other thrombophilia: Secondary | ICD-10-CM | POA: Insufficient documentation

## 2022-08-24 DIAGNOSIS — Z79899 Other long term (current) drug therapy: Secondary | ICD-10-CM | POA: Insufficient documentation

## 2022-08-24 MED ORDER — AMIODARONE HCL 200 MG PO TABS: ORAL_TABLET | ORAL | 0 refills | Status: DC

## 2022-08-24 NOTE — Patient Instructions (Signed)
Start Amiodarone 200mg  -- take 1 tablet twice a day for 14 days then reduce to once a day - take with food

## 2022-08-24 NOTE — Progress Notes (Signed)
Primary Care Physician: Blair Heys, MD Primary Cardiologist: Verne Carrow, MD Electrophysiologist: None     Referring Physician: Dr. Marva Panda is a 89 y.o. female with a history of CAD, HTN, HLD, SVT, breast cancer, chronic diastolic CHF, s/p TAVR, bilateral carotid artery disease, and paroxysmal atrial fibrillation who presents for consultation in the [facility]. Recent phone calls titrating diuresis for SOB and edema; most recent phone call noted to be in likely Afib with RVR. Patient is on Xarelto 15 mg daily for a CHADS2VASC score of 6.  On evaluation today, she is currently in Afib with RVR. She is taking lasix 80 mg AM and 40 mg PM currently as recommended by Dr. Clifton James per recent phone call notes. Her SOB and leg swelling is better than before but still ongoing.   Today, she denies symptoms of palpitations, chest pain, orthopnea, PND, dizziness, presyncope, syncope, snoring, daytime somnolence, bleeding, or neurologic sequela. The patient is tolerating medications without difficulties and is otherwise without complaint today.    she has a BMI of Body mass index is 30.79 kg/m.Marland Kitchen Filed Weights   08/24/22 0836  Weight: 78.8 kg    Current Outpatient Medications  Medication Sig Dispense Refill   acetaminophen (TYLENOL) 650 MG CR tablet Take 650 mg by mouth daily.     amiodarone (PACERONE) 200 MG tablet Take 1 tablet by mouth twice a day for 14 days then reduce to once a day 60 tablet 0   atenolol (TENORMIN) 25 MG tablet Take 1 tablet (25 mg total) by mouth daily. 90 tablet 3   Calcium Carbonate-Vitamin D (CALCIUM 600/VITAMIN D PO) Take 1 tablet by mouth daily.     dextromethorphan (DELSYM) 30 MG/5ML liquid Take 30 mg by mouth at bedtime as needed for cough.     ergocalciferol (VITAMIN D2) 1.25 MG (50000 UT) capsule Take 50,000 Units by mouth once a week. Saturday's     fexofenadine (QC FEXOFENADINE HYDROCHLORIDE) 180  MG tablet Take 180 mg by mouth daily.     furosemide (LASIX) 20 MG tablet Take 2 tablets (40 mg total) by mouth 2 (two) times daily. 360 tablet 3   hydrocortisone 2.5 % cream Apply topically daily. 30 g 0   levothyroxine (SYNTHROID, LEVOTHROID) 112 MCG tablet Take 112 mcg by mouth daily before breakfast.     Magnesium 400 MG CAPS Take 400 mg by mouth daily.      Menthol, Topical Analgesic, (BIOFREEZE EX) Apply 1 application topically as needed (pain).      methylPREDNISolone (MEDROL DOSEPAK) 4 MG TBPK tablet Use as directed 21 tablet 0   Multiple Vitamin (MULTIVITAMIN WITH MINERALS) TABS tablet Take 1 tablet by mouth daily.     potassium chloride (KLOR-CON M) 10 MEQ tablet Take 1 tablet (10 mEq total) by mouth 2 (two) times daily. 180 tablet 2   Rivaroxaban (XARELTO) 15 MG TABS tablet Take 1 tablet (15 mg total) by mouth daily with supper. 90 tablet 3   vitamin C (ASCORBIC ACID) 500 MG tablet Take 500 mg by mouth daily.     No current facility-administered medications for this encounter.    Atrial Fibrillation Management history:  Previous antiarrhythmic drugs: None Previous cardioversions: 01/01/22 Previous ablations: None Anticoagulation history: Xarelto 15 mg daily   ROS- All systems are reviewed and negative except as per the HPI above.  Physical Exam: BP 100/70   Pulse (!) 104   Ht 5\' 3"  (1.6 m)  Wt 78.8 kg   LMP  (LMP Unknown)   BMI 30.79 kg/m   GEN: Well nourished, well developed in no acute distress NECK: No JVD; No carotid bruits CARDIAC: Irregularly irregular tachycardic rate and rhythm, no murmurs, rubs, gallops RESPIRATORY:  Crackles appreciated at bilateral bases.  ABDOMEN: Soft, non-tender, non-distended EXTREMITIES:  2+ bilateral pitting edema; No deformity   EKG today demonstrates  Vent. rate 104 BPM PR interval * ms QRS duration 84 ms QT/QTcB 316/415 ms P-R-T axes * 80 194 Atrial fibrillation with rapid ventricular response ST & T wave abnormality,  consider inferior ischemia Abnormal ECG When compared with ECG of 01-Jan-2022 13:11, PREVIOUS ECG IS PRESENT Prior tracing showed NSR Confirmed by Marca Ancona [phone]) on 08/24/2022 8:55:43 AM  Echo 01/01/22 demonstrated   1. Left ventricular ejection fraction, by estimation, is 50 to 55%. The  left ventricle has low normal function.   2. Right ventricular systolic function is normal. The right ventricular  size is normal.   3. Left atrial size was severely dilated. No left atrial/left atrial  appendage thrombus was detected. The LAA emptying velocity was 49 cm/s.   4. The mitral valve is degenerative. Mild to moderate mitral valve  regurgitation.   5. The aortic valve has been repaired/replaced. Aortic valve  regurgitation is trivial. There is a 26 mm Sapien prosthetic (TAVR) valve  present in the aortic position. Procedure Date: 07/30/2016. Echo findings  are consistent with normal structure and  function of the aortic valve prosthesis. Aortic valve mean gradient  measures 6.0 mmHg. Aortic valve Vmax measures 1.58 m/s.   6. There is Severe, mobile (Grade V) plaque involving the aortic arch and  ascending aorta (clip 78).   7. Following TEE, the patient underwent successful DCCV with 150J x1.    ASSESSMENT & PLAN CHA2DS2-VASc Score = 6  The patient's score is based upon: CHF History: 1 HTN History: 1 Diabetes History: 0 Stroke History: 0 Vascular Disease History: 1 Age Score: 2 Gender Score: 1       ASSESSMENT AND PLAN: Paroxysmal Atrial Fibrillation (ICD10:  I48.0) - suspect persistent The patient's CHA2DS2-VASc score is 6, indicating a 9.7% annual risk of stroke.    She is currently in Afib with RVR.   We discussed treatment options going forward including cardioversion, Tikosyn, and amiodarone for medication therapy. After discussion with patient and daughter, patient has agreed to proceed with amiodarone for medication therapy.  Begin amiodarone 200 mg BID x 2  weeks, then transition to amiodarone 200 mg daily. Follow up in 2 weeks to determine if chemical conversion or requires DCCV.  Recommended to continue diuresis regimen as stated by Dr. Clifton James and to call with update as directed.   Secondary Hypercoagulable State (ICD10:  D68.69) The patient is at significant risk for stroke/thromboembolism based upon her CHA2DS2-VASc Score of 6.  Continue Rivaroxaban (Xarelto).  No missed doses     Follow up 2 weeks.   Lake Bells, PA-C  Afib Clinic [facility] [address] [place], [place] [address] [phone]

## 2022-09-08 ENCOUNTER — Ambulatory Visit (HOSPITAL_COMMUNITY): Payer: Medicare Other | Admitting: Internal Medicine

## 2022-09-08 ENCOUNTER — Ambulatory Visit (HOSPITAL_COMMUNITY)
Admission: RE | Admit: 2022-09-08 | Discharge: 2022-09-08 | Disposition: A | Discharge status: Home / Self Care | Payer: Medicare Other | Source: Ambulatory Visit | Attending: Internal Medicine | Admitting: Internal Medicine

## 2022-09-08 VITALS — BP 112/76 | HR 95 | Ht 63.0 in | Wt 172.4 lb

## 2022-09-08 DIAGNOSIS — Z7901 Long term (current) use of anticoagulants: Secondary | ICD-10-CM | POA: Insufficient documentation

## 2022-09-08 DIAGNOSIS — I480 Paroxysmal atrial fibrillation: Secondary | ICD-10-CM | POA: Diagnosis not present

## 2022-09-08 DIAGNOSIS — D6869 Other thrombophilia: Secondary | ICD-10-CM | POA: Insufficient documentation

## 2022-09-08 DIAGNOSIS — Z96641 Presence of right artificial hip joint: Secondary | ICD-10-CM | POA: Diagnosis not present

## 2022-09-08 DIAGNOSIS — I4819 Other persistent atrial fibrillation: Secondary | ICD-10-CM | POA: Diagnosis not present

## 2022-09-08 DIAGNOSIS — M1712 Unilateral primary osteoarthritis, left knee: Secondary | ICD-10-CM | POA: Diagnosis not present

## 2022-09-08 DIAGNOSIS — S7001XA Contusion of right hip, initial encounter: Secondary | ICD-10-CM | POA: Diagnosis not present

## 2022-09-08 LAB — CBC
HCT: 40.2 % (ref 36.0–46.0)
Hemoglobin: 12.5 g/dL (ref 12.0–15.0)
MCH: 32.5 pg (ref 26.0–34.0)
MCHC: 31.1 g/dL (ref 30.0–36.0)
MCV: 104.4 fL — ABNORMAL HIGH (ref 80.0–100.0)
Platelets: 194 10*3/uL (ref 150–400)
RBC: 3.85 MIL/uL — ABNORMAL LOW (ref 3.87–5.11)
RDW: 14.5 % (ref 11.5–15.5)
WBC: 5.5 10*3/uL (ref 4.0–10.5)
nRBC: 0 % (ref 0.0–0.2)

## 2022-09-08 LAB — COMPREHENSIVE METABOLIC PANEL
ALT: 17 U/L (ref 0–44)
AST: 20 U/L (ref 15–41)
Albumin: 3.5 g/dL (ref 3.5–5.0)
Alkaline Phosphatase: 56 U/L (ref 38–126)
Anion gap: 11 (ref 5–15)
BUN: 39 mg/dL — ABNORMAL HIGH (ref 8–23)
CO2: 30 mmol/L (ref 22–32)
Calcium: 9.1 mg/dL (ref 8.9–10.3)
Chloride: 95 mmol/L — ABNORMAL LOW (ref 98–111)
Creatinine, Ser: 1.51 mg/dL — ABNORMAL HIGH (ref 0.44–1.00)
GFR, Estimated: 33 mL/min — ABNORMAL LOW (ref >60)
Glucose, Bld: 111 mg/dL — ABNORMAL HIGH (ref 70–99)
Potassium: 3.4 mmol/L — ABNORMAL LOW (ref 3.5–5.1)
Sodium: 136 mmol/L (ref 135–145)
Total Bilirubin: 1.1 mg/dL (ref 0.3–1.2)
Total Protein: 6.8 g/dL (ref 6.5–8.1)

## 2022-09-08 NOTE — Progress Notes (Signed)
Primary Care Physician: Blair Heys, MD Primary Cardiologist: Verne Carrow, MD Electrophysiologist: None     Referring Physician: Dr. Marva Panda is a 89 y.o. female with a history of CAD, HTN, HLD, SVT, breast cancer, chronic diastolic CHF, s/p TAVR, bilateral carotid artery disease, and paroxysmal atrial fibrillation who presents for consultation in the [facility]ibr[facility]. Recent phone calls titrating diuresis for SOB and edema; most recent phone call noted to be in likely Afib with RVR. Patient is on Xarelto 15 mg daily for a CHADS2VASC score of 6.  On evaluation today, she is currently in Afib with RVR. She is taking lasix 80 mg AM and 40 mg PM currently as recommended by Dr. Clifton James per recent phone call notes. Her SOB and leg swelling is better than before but still ongoing.   On follow up 09/08/22, she is currently in Afib. She has ongoing SOB and edema which is not any better. She is still taking lasix 80 mg BID and K+ 20 daily. She is tired with activity. No missed doses of Xarelto 15 mg.   Today, she denies symptoms of palpitations, chest pain, orthopnea, PND, dizziness, presyncope, syncope, snoring, daytime somnolence, bleeding, or neurologic sequela. The patient is tolerating medications without difficulties and is otherwise without complaint today.    she has a BMI of Body mass index is 30.54 kg/m.Marland Kitchen Filed Weights   09/08/22 0921  Weight: 78.2 kg     Current Outpatient Medications  Medication Sig Dispense Refill   acetaminophen (TYLENOL) 650 MG CR tablet Take 650 mg by mouth as needed.     amiodarone (PACERONE) 200 MG tablet Take 1 tablet by mouth twice a day for 14 days then reduce to once a day 60 tablet 0   atenolol (TENORMIN) 25 MG tablet Take 1 tablet (25 mg total) by mouth daily. 90 tablet 3   Calcium Carbonate-Vitamin D (CALCIUM 600/VITAMIN D PO) Take 1 tablet by mouth daily.     dextromethorphan (DELSYM) 30  MG/5ML liquid Take 30 mg by mouth at bedtime as needed for cough.     ergocalciferol (VITAMIN D2) 1.25 MG (50000 UT) capsule Take 50,000 Units by mouth once a week. Saturday's     fexofenadine (QC FEXOFENADINE HYDROCHLORIDE) 180 MG tablet Take 180 mg by mouth as needed.     furosemide (LASIX) 20 MG tablet Take 2 tablets (40 mg total) by mouth 2 (two) times daily. (Patient taking differently: Take 80 mg by mouth 2 (two) times daily.) 360 tablet 3   hydrocortisone 2.5 % cream Apply topically daily. (Patient taking differently: Apply topically as needed.) 30 g 0   levothyroxine (SYNTHROID, LEVOTHROID) 112 MCG tablet Take 112 mcg by mouth daily before breakfast.     Magnesium 400 MG CAPS Take 400 mg by mouth daily.      Menthol, Topical Analgesic, (BIOFREEZE EX) Apply 1 application topically as needed (pain).      Multiple Vitamin (MULTIVITAMIN WITH MINERALS) TABS tablet Take 1 tablet by mouth daily.     potassium chloride (KLOR-CON M) 10 MEQ tablet Take 1 tablet (10 mEq total) by mouth 2 (two) times daily. 180 tablet 2   Rivaroxaban (XARELTO) 15 MG TABS tablet Take 1 tablet (15 mg total) by mouth daily with supper. 90 tablet 3   vitamin C (ASCORBIC ACID) 500 MG tablet Take 500 mg by mouth daily.     No current facility-administered medications for this encounter.  Atrial Fibrillation Management history:  Previous antiarrhythmic drugs: amiodarone Previous cardioversions: 01/01/22 Previous ablations: None Anticoagulation history: Xarelto 15 mg daily   ROS- All systems are reviewed and negative except as per the HPI above.  Physical Exam: BP 112/76   Pulse 95   Ht 5\' 3"  (1.6 m)   Wt 78.2 kg   LMP  (LMP Unknown)   BMI 30.54 kg/m   GEN- The patient is well appearing, alert and oriented x 3 today.   Neck - no JVD or carotid bruit noted Lungs- Clear to ausculation bilaterally, normal work of breathing Heart- Irregular rate and rhythm, no murmurs, rubs or gallops, PMI not laterally  displaced Extremities- 2+ bilateral lower extremity edema, left slightly erythematous compared to right. Not weeping. Not tender to touch.  Skin - no rash or ecchymosis noted  EKG today demonstrates  Vent. rate 95 BPM PR interval * ms QRS duration 104 ms QT/QTcB 376/472 ms P-R-T axes * -27 210 Atrial fibrillation ST & T wave abnormality, consider inferior ischemia Abnormal ECG When compared with ECG of 24-Aug-2022 08:39, PREVIOUS ECG IS PRESENT   Echo 01/01/22 demonstrated   1. Left ventricular ejection fraction, by estimation, is 50 to 55%. The  left ventricle has low normal function.   2. Right ventricular systolic function is normal. The right ventricular  size is normal.   3. Left atrial size was severely dilated. No left atrial/left atrial  appendage thrombus was detected. The LAA emptying velocity was 49 cm/s.   4. The mitral valve is degenerative. Mild to moderate mitral valve  regurgitation.   5. The aortic valve has been repaired/replaced. Aortic valve  regurgitation is trivial. There is a 26 mm Sapien prosthetic (TAVR) valve  present in the aortic position. Procedure Date: 07/30/2016. Echo findings  are consistent with normal structure and  function of the aortic valve prosthesis. Aortic valve mean gradient  measures 6.0 mmHg. Aortic valve Vmax measures 1.58 m/s.   6. There is Severe, mobile (Grade V) plaque involving the aortic arch and  ascending aorta (clip 78).   7. Following TEE, the patient underwent successful DCCV with 150J x1.    ASSESSMENT & PLAN CHA2DS2-VASc Score = 6  The patient's score is based upon: CHF History: 1 HTN History: 1 Diabetes History: 0 Stroke History: 0 Vascular Disease History: 1 Age Score: 2 Gender Score: 1      ASSESSMENT AND PLAN: Persistent Atrial Fibrillation (ICD10:  I48.0)  The patient's CHA2DS2-VASc score is 6, indicating a 9.7% annual risk of stroke.    She is currently in Afib. She is in agreement to proceed with  cardioversion. She has ongoing edema and after discussion with Dr. Clifton James will continue current regimen without change. She will transition to amiodarone 200 mg daily.   Labs today.  Informed Consent   Shared Decision Making/Informed Consent The risks (stroke, cardiac arrhythmias rarely resulting in the need for a temporary or permanent pacemaker, skin irritation or burns and complications associated with conscious sedation including aspiration, arrhythmia, respiratory failure and death), benefits (restoration of normal sinus rhythm) and alternatives of a direct current cardioversion were explained in detail to Ms. Magri and she agrees to proceed.       Secondary Hypercoagulable State (ICD10:  D68.69) The patient is at significant risk for stroke/thromboembolism based upon her CHA2DS2-VASc Score of 6.  Continue Rivaroxaban (Xarelto).  No missed doses     Follow up 2 weeks after DCCV.    Lake Bells, PA-C  Afib  Clinic [facility] [address] [place], [place] [address] [phone]

## 2022-09-08 NOTE — Patient Instructions (Signed)
Cardioversion scheduled for: Friday, September 6th   - Arrive at the [facility] and go to admitting at 12pm   - Do not eat or drink anything after midnight the night prior to your procedure.   - Take all your morning medication (except diabetic medications) with a sip of water prior to arrival.  - You will not be able to drive home after your procedure.    - Do NOT miss any doses of your blood thinner - if you should miss a dose please notify our office immediately.   - If you feel as if you go back into normal rhythm prior to scheduled cardioversion, please notify our office immediately.   If your procedure is canceled in the cardioversion suite you will be charged a cancellation fee.

## 2022-09-08 NOTE — H&P (View-Only) (Signed)
 Primary Care Physician: Hugh Charleston, MD Primary Cardiologist: Lonni Cash, MD Electrophysiologist: None     Referring Physician: Dr. Cash [name] is a 89 y.o. female with a history of CAD, HTN, HLD, SVT, breast cancer, chronic diastolic CHF, s/p TAVR, bilateral carotid artery disease, and paroxysmal atrial fibrillation who presents for consultation in the [facility]ibr[facility]. Recent phone calls titrating diuresis for SOB and edema; most recent phone call noted to be in likely Afib with RVR. Patient is on Xarelto  15 mg daily for a CHADS2VASC score of 6.  On evaluation today, she is currently in Afib with RVR. She is taking lasix  80 mg AM and 40 mg PM currently as recommended by Dr. Cash per recent phone call notes. Her SOB and leg swelling is better than before but still ongoing.   On follow up 09/08/22, she is currently in Afib. She has ongoing SOB and edema which is not any better. She is still taking lasix  80 mg BID and K+ 20 daily. She is tired with activity. No missed doses of Xarelto  15 mg.   Today, she denies symptoms of palpitations, chest pain, orthopnea, PND, dizziness, presyncope, syncope, snoring, daytime somnolence, bleeding, or neurologic sequela. The patient is tolerating medications without difficulties and is otherwise without complaint today.    she has a BMI of Body mass index is 30.54 kg/m.SABRA Filed Weights   09/08/22 0921  Weight: 78.2 kg     Current Outpatient Medications  Medication Sig Dispense Refill   acetaminophen  (TYLENOL ) 650 MG CR tablet Take 650 mg by mouth as needed.     amiodarone  (PACERONE ) 200 MG tablet Take 1 tablet by mouth twice a day for 14 days then reduce to once a day 60 tablet 0   atenolol  (TENORMIN ) 25 MG tablet Take 1 tablet (25 mg total) by mouth daily. 90 tablet 3   Calcium Carbonate-Vitamin D  (CALCIUM 600/VITAMIN D  PO) Take 1 tablet by mouth daily.     dextromethorphan  (DELSYM ) 30  MG/5ML liquid Take 30 mg by mouth at bedtime as needed for cough.     ergocalciferol  (VITAMIN D2) 1.25 MG (50000 UT) capsule Take 50,000 Units by mouth once a week. Saturday's     fexofenadine (QC FEXOFENADINE HYDROCHLORIDE) 180 MG tablet Take 180 mg by mouth as needed.     furosemide  (LASIX ) 20 MG tablet Take 2 tablets (40 mg total) by mouth 2 (two) times daily. (Patient taking differently: Take 80 mg by mouth 2 (two) times daily.) 360 tablet 3   hydrocortisone  2.5 % cream Apply topically daily. (Patient taking differently: Apply topically as needed.) 30 g 0   levothyroxine  (SYNTHROID , LEVOTHROID) 112 MCG tablet Take 112 mcg by mouth daily before breakfast.     Magnesium  400 MG CAPS Take 400 mg by mouth daily.      Menthol , Topical Analgesic, (BIOFREEZE EX) Apply 1 application topically as needed (pain).      Multiple Vitamin (MULTIVITAMIN WITH MINERALS) TABS tablet Take 1 tablet by mouth daily.     potassium chloride  (KLOR-CON  M) 10 MEQ tablet Take 1 tablet (10 mEq total) by mouth 2 (two) times daily. 180 tablet 2   Rivaroxaban  (XARELTO ) 15 MG TABS tablet Take 1 tablet (15 mg total) by mouth daily with supper. 90 tablet 3   vitamin C  (ASCORBIC ACID ) 500 MG tablet Take 500 mg by mouth daily.     No current facility-administered medications for this encounter.  Atrial Fibrillation Management history:  Previous antiarrhythmic drugs: amiodarone  Previous cardioversions: 01/01/22 Previous ablations: None Anticoagulation history: Xarelto  15 mg daily   ROS- All systems are reviewed and negative except as per the HPI above.  Physical Exam: BP 112/76   Pulse 95   Ht 5\' 3"  (1.6 m)   Wt 78.2 kg   LMP  (LMP Unknown)   BMI 30.54 kg/m   GEN- The patient is well appearing, alert and oriented x 3 today.   Neck - no JVD or carotid bruit noted Lungs- Clear to ausculation bilaterally, normal work of breathing Heart- Irregular rate and rhythm, no murmurs, rubs or gallops, PMI not laterally  displaced Extremities- 2+ bilateral lower extremity edema, left slightly erythematous compared to right. Not weeping. Not tender to touch.  Skin - no rash or ecchymosis noted  EKG today demonstrates  Vent. rate 95 BPM PR interval * ms QRS duration 104 ms QT/QTcB 376/472 ms P-R-T axes * -27 210 Atrial fibrillation ST & T wave abnormality, consider inferior ischemia Abnormal ECG When compared with ECG of 24-Aug-2022 08:39, PREVIOUS ECG IS PRESENT   Echo 01/01/22 demonstrated   1. Left ventricular ejection fraction, by estimation, is 50 to 55%. The  left ventricle has low normal function.   2. Right ventricular systolic function is normal. The right ventricular  size is normal.   3. Left atrial size was severely dilated. No left atrial/left atrial  appendage thrombus was detected. The LAA emptying velocity was 49 cm/s.   4. The mitral valve is degenerative. Mild to moderate mitral valve  regurgitation.   5. The aortic valve has been repaired/replaced. Aortic valve  regurgitation is trivial. There is a 26 mm Sapien prosthetic (TAVR) valve  present in the aortic position. Procedure Date: 07/30/2016. Echo findings  are consistent with normal structure and  function of the aortic valve prosthesis. Aortic valve mean gradient  measures 6.0 mmHg. Aortic valve Vmax measures 1.58 m/s.   6. There is Severe, mobile (Grade V) plaque involving the aortic arch and  ascending aorta (clip 78).   7. Following TEE, the patient underwent successful DCCV with 150J x1.    ASSESSMENT & PLAN CHA2DS2-VASc Score = 6  The patient's score is based upon: CHF History: 1 HTN History: 1 Diabetes History: 0 Stroke History: 0 Vascular Disease History: 1 Age Score: 2 Gender Score: 1      ASSESSMENT AND PLAN: Persistent Atrial Fibrillation (ICD10:  I48.0)  The patient's CHA2DS2-VASc score is 6, indicating a 9.7% annual risk of stroke.    She is currently in Afib. She is in agreement to proceed with  cardioversion. She has ongoing edema and after discussion with Dr. Verlin will continue current regimen without change. She will transition to amiodarone  200 mg daily.   Labs today.  Informed Consent   Shared Decision Making/Informed Consent The risks (stroke, cardiac arrhythmias rarely resulting in the need for a temporary or permanent pacemaker, skin irritation or burns and complications associated with conscious sedation including aspiration, arrhythmia, respiratory failure and death), benefits (restoration of normal sinus rhythm) and alternatives of a direct current cardioversion were explained in detail to Ms. Mcqueen and she agrees to proceed.       Secondary Hypercoagulable State (ICD10:  D68.69) The patient is at significant risk for stroke/thromboembolism based upon her CHA2DS2-VASc Score of 6.  Continue Rivaroxaban  (Xarelto ).  No missed doses     Follow up 2 weeks after DCCV.    Terra Pac, PA-C  Afib  Clinic [facility] [address] [place], [place] [address] [phone]

## 2022-09-09 NOTE — Progress Notes (Signed)
 Unable to reach patient about procedure, but was able to leave a detailed message. Stated that the patient needed to arrive at the hospital at 1015 , remain NPO after 0000, needs to have a ride home and a responsible adult to stay with them for 24 hours after the procedure. Instructed the patient to call back if they had any questions.

## 2022-09-10 ENCOUNTER — Encounter (HOSPITAL_COMMUNITY)
Admission: RE | Disposition: A | Discharge status: Home / Self Care | Payer: Self-pay | Source: Home / Self Care | Attending: Cardiovascular Disease

## 2022-09-10 ENCOUNTER — Ambulatory Visit (HOSPITAL_COMMUNITY): Payer: Medicare Other | Admitting: Anesthesiology

## 2022-09-10 ENCOUNTER — Other Ambulatory Visit: Payer: Self-pay

## 2022-09-10 ENCOUNTER — Telehealth: Payer: Self-pay | Admitting: Cardiovascular Disease

## 2022-09-10 ENCOUNTER — Ambulatory Visit (HOSPITAL_COMMUNITY)
Admission: RE | Admit: 2022-09-10 | Discharge: 2022-09-10 | Disposition: A | Discharge status: Home / Self Care | Payer: Medicare Other | Attending: Cardiovascular Disease | Admitting: Cardiovascular Disease

## 2022-09-10 DIAGNOSIS — I110 Hypertensive heart disease with heart failure: Secondary | ICD-10-CM | POA: Diagnosis not present

## 2022-09-10 DIAGNOSIS — N183 Chronic kidney disease, stage 3 unspecified: Secondary | ICD-10-CM | POA: Diagnosis not present

## 2022-09-10 DIAGNOSIS — E039 Hypothyroidism, unspecified: Secondary | ICD-10-CM

## 2022-09-10 DIAGNOSIS — I480 Paroxysmal atrial fibrillation: Secondary | ICD-10-CM | POA: Diagnosis not present

## 2022-09-10 DIAGNOSIS — I509 Heart failure, unspecified: Secondary | ICD-10-CM | POA: Diagnosis not present

## 2022-09-10 DIAGNOSIS — D6869 Other thrombophilia: Secondary | ICD-10-CM | POA: Insufficient documentation

## 2022-09-10 DIAGNOSIS — I2510 Atherosclerotic heart disease of native coronary artery without angina pectoris: Secondary | ICD-10-CM | POA: Diagnosis not present

## 2022-09-10 DIAGNOSIS — I4819 Other persistent atrial fibrillation: Secondary | ICD-10-CM | POA: Diagnosis not present

## 2022-09-10 DIAGNOSIS — Z7901 Long term (current) use of anticoagulants: Secondary | ICD-10-CM | POA: Insufficient documentation

## 2022-09-10 DIAGNOSIS — E7800 Pure hypercholesterolemia, unspecified: Secondary | ICD-10-CM | POA: Diagnosis not present

## 2022-09-10 DIAGNOSIS — I4891 Unspecified atrial fibrillation: Secondary | ICD-10-CM

## 2022-09-10 DIAGNOSIS — I5032 Chronic diastolic (congestive) heart failure: Secondary | ICD-10-CM | POA: Diagnosis not present

## 2022-09-10 HISTORY — PX: CARDIOVERSION: SHX1299

## 2022-09-10 LAB — POCT I-STAT, CHEM 8
BUN: 43 mg/dL — ABNORMAL HIGH (ref 8–23)
Calcium, Ion: 1.21 mmol/L (ref 1.15–1.40)
Chloride: 96 mmol/L — ABNORMAL LOW (ref 98–111)
Creatinine, Ser: 1.6 mg/dL — ABNORMAL HIGH (ref 0.44–1.00)
Glucose, Bld: 119 mg/dL — ABNORMAL HIGH (ref 70–99)
HCT: 40 % (ref 36.0–46.0)
Hemoglobin: 13.6 g/dL (ref 12.0–15.0)
Potassium: 4 mmol/L (ref 3.5–5.1)
Sodium: 139 mmol/L (ref 135–145)
TCO2: 30 mmol/L (ref 22–32)

## 2022-09-10 SURGERY — CARDIOVERSION
Anesthesia: General

## 2022-09-10 MED ORDER — EPHEDRINE SULFATE (PRESSORS) 50 MG/ML IJ SOLN
INTRAMUSCULAR | Status: DC | PRN
  Administered 2022-09-10: 5 mg via INTRAVENOUS

## 2022-09-10 MED ORDER — PROPOFOL 10 MG/ML IV BOLUS
INTRAVENOUS | Status: DC | PRN
  Administered 2022-09-10: 50 mg via INTRAVENOUS

## 2022-09-10 MED ORDER — LIDOCAINE 2% (20 MG/ML) 5 ML SYRINGE
INTRAMUSCULAR | Status: DC | PRN
  Administered 2022-09-10: 40 mg via INTRAVENOUS

## 2022-09-10 MED ORDER — SODIUM CHLORIDE 0.9 % IV SOLN: INTRAVENOUS | Status: DC

## 2022-09-10 SURGICAL SUPPLY — 1 items: ELECT DEFIB PAD ADLT CADENCE (PAD) ×1 IMPLANT

## 2022-09-10 NOTE — Transfer of Care (Signed)
Immediate Anesthesia Transfer of Care Note  Patient: [name]  Procedure(s) Performed: CARDIOVERSION  Patient Location: PACU  Anesthesia Type:MAC  Level of Consciousness: awake, oriented, and drowsy  Airway & Oxygen Therapy: Patient connected to nasal cannula oxygen  Post-op Assessment: Report given to RN and Post -op Vital signs reviewed and stable  Post vital signs: Reviewed and stable  Last Vitals:  Vitals Value Taken Time  BP 136/71 09/10/22 1030  Temp    Pulse 72 09/10/22 1030  Resp 18 09/10/22 1030  SpO2 94 % 09/10/22 1030  Vitals shown include unfiled device data.  Last Pain:  Vitals:   09/10/22 1017  TempSrc:   PainSc: 0-No pain         Complications: No notable events documented.

## 2022-09-10 NOTE — CV Procedure (Signed)
DCC: On Rx Xarelto low dose for age/GFR Anesthesia: Propofol  DCC x 1 200J  Converted from afib rate 88 to SB rate 52 bpm  Some low sats and BP post conversion.  Slow to wake up with 50 mg propofol would use less next time  Given ephedrine and fluid with improved sats 97% and systolic BP 100/mmHg  No immediate neurologic sequelae  Charlton Haws MD [facility]

## 2022-09-10 NOTE — Telephone Encounter (Signed)
Patient is calling to follow up. Patient stated the fluid decreased in her legs.

## 2022-09-10 NOTE — Discharge Instructions (Addendum)
Hold atenolol today and tomorrow can resume Sunday if HR > 60 bpm

## 2022-09-10 NOTE — Telephone Encounter (Signed)
Patient current dose of lasix 80 mg BID and potassium 40 BID - she is in normal rhythm after today's cardioversion.   She has not taken any pills yet today and wants to know what her current regimen should be. She is a little SOB currently.   I adv patient to take 80 mg lasix and 40 mg potassium now.  Then Sat and Sun go back to her previous doses of  Lasix 80 am, 40 pm and Potassium 40 am and 20 pm   Aware that I will call her Monday morning and let her know if Dr. Clifton James recommends any changes or if she should continue on this regimen.  Pt voices understanding and agreement.

## 2022-09-10 NOTE — Interval H&P Note (Signed)
History and Physical Interval Note:  09/10/2022 11:24 AM  [name]  has presented today for surgery, with the diagnosis of AFIB.  The various methods of treatment have been discussed with the patient and family. After consideration of risks, benefits and other options for treatment, the patient has consented to  Procedure(s): CARDIOVERSION (N/A) as a surgical intervention.  The patient's history has been reviewed, patient examined, no change in status, stable for surgery.  I have reviewed the patient's chart and labs.  Questions were answered to the patient's satisfaction.     Charlton Haws

## 2022-09-10 NOTE — Anesthesia Preprocedure Evaluation (Signed)
Anesthesia Evaluation  Patient identified by MRN, date of birth, ID band Patient awake    Reviewed: Allergy & Precautions, NPO status , Patient's Chart, lab work & pertinent test results  History of Anesthesia Complications Negative for: history of anesthetic complications  Airway Mallampati: II   Neck ROM: Full    Dental no notable dental hx. (+) Dental Advisory Given   Pulmonary neg pulmonary ROS   Pulmonary exam normal        Cardiovascular + Peripheral Vascular Disease  + dysrhythmias Atrial Fibrillation and Supra Ventricular Tachycardia  Rhythm:Irregular Rate:Tachycardia   S/p AVR  '23 TTE - EF 55 to 60%. Grade II diastolic dysfunction (pseudonormalization). Left atrial size was moderately dilated. S/p 26 mm Sapien Aortic Prosthesis. Date 07/27/2016. No PVL. AV max 2.1 m/s. DI 0.44. Mean gradient 10 mmHg (improved from prior study 09/22/2018) Normal Prosthesis. Aortic valve regurgitation is not visualized.     Neuro/Psych negative neurological ROS  negative psych ROS   GI/Hepatic Neg liver ROS, PUD,,,  Endo/Other  Hypothyroidism    Renal/GU CRFRenal disease     Musculoskeletal  (+) Arthritis ,    Abdominal   Peds  Hematology negative hematology ROS (+)   Anesthesia Other Findings   Reproductive/Obstetrics  Breast cancer                              Anesthesia Physical Anesthesia Plan  ASA: 3  Anesthesia Plan: General   Post-op Pain Management: Minimal or no pain anticipated   Induction: Intravenous  PONV Risk Score and Plan: 3 and Treatment may vary due to age or medical condition and Propofol infusion  Airway Management Planned: Natural Airway, Nasal Cannula and Simple Face Mask  Additional Equipment: None  Intra-op Plan:   Post-operative Plan:   Informed Consent: I have reviewed the patients History and Physical, chart, labs and discussed the procedure including  the risks, benefits and alternatives for the proposed anesthesia with the patient or authorized representative who has indicated his/her understanding and acceptance.     Dental advisory given  Plan Discussed with: CRNA and Anesthesiologist  Anesthesia Plan Comments: ( )        Anesthesia Quick Evaluation

## 2022-09-10 NOTE — Anesthesia Postprocedure Evaluation (Signed)
Anesthesia Post Note  Patient: [name]  Procedure(s) Performed: CARDIOVERSION     Patient location during evaluation: PACU Anesthesia Type: General Level of consciousness: awake and alert Pain management: pain level controlled Vital Signs Assessment: post-procedure vital signs reviewed and stable Respiratory status: spontaneous breathing, nonlabored ventilation, respiratory function stable and patient connected to nasal cannula oxygen Cardiovascular status: blood pressure returned to baseline and stable Postop Assessment: no apparent nausea or vomiting Anesthetic complications: no  No notable events documented.  Last Vitals:  Vitals:   09/10/22 1115 09/10/22 1119  BP: 110/72   Pulse: (!) 51   Resp: 15   Temp:  36.5 C  SpO2: 93%     Last Pain:  Vitals:   09/10/22 1119  TempSrc: Temporal  PainSc:                  [name]

## 2022-09-10 NOTE — Telephone Encounter (Signed)
  Pt c/o medication issue:  1. Name of Medication: furosemide (LASIX)  potassium chloride (KLOR-CON M) 10 MEQ tablet   2. How are you currently taking this medication (dosage and times per day)? Patient is currently taking 4 in the morning and 4 at noon.   3. Are you having a reaction (difficulty breathing--STAT)?   4. What is your medication issue? Patient daughter called stating her mother had a cardioversion done today.  She states she doesn't she doesn't know how much of her lasix she should take now if she should still take the same amount. Also they had her double her potassium. Please advise.

## 2022-09-11 ENCOUNTER — Telehealth: Payer: Self-pay | Admitting: Cardiology

## 2022-09-11 NOTE — Telephone Encounter (Signed)
Daughter called in reporting that patient notes she is back in atrial fibrillation this morning.  In review of notes patient was recently started on amiodarone load and underwent cardioversion 9/6.  Her metoprolol was held as her heart rates were noted in the upper 40s to 50s range post cardioversion.  This morning she notes her heart rate in the 90s and back in atrial fibrillation.  She is compliant with her Xarelto.  I advised okay to resume her metoprolol with close heart rate monitoring.  Will route to A-fib clinic as an FYI for patient to follow-up.  Daughter voiced understanding and thanked me for call back.

## 2022-09-13 ENCOUNTER — Encounter (HOSPITAL_COMMUNITY): Payer: Self-pay | Admitting: Cardiovascular Disease

## 2022-09-13 MED ORDER — FUROSEMIDE 20 MG PO TABS: ORAL_TABLET | ORAL | 3 refills | Status: DC

## 2022-09-13 MED ORDER — POTASSIUM CHLORIDE CRYS ER 10 MEQ PO TBCR: EXTENDED_RELEASE_TABLET | ORAL | 2 refills | Status: DC

## 2022-09-13 NOTE — Telephone Encounter (Signed)
Janice Hazel, MD  Janice Ka, RN Let's check on her Monday and see how she is feeling. She may need Lasix 80 mg po BID every day going forward. [name] and spoke w the patient.  Her weight is down to 160.1 this morning, breathing and LE swelling are improved.  Her watch notified her that she is back out of rhythm sometime Friday night and per notes her daughter was notified to have her restart metoprolol.  The patient states she is taking everything she is supposed to.   Reviewed w Dr. Clifton James who recommends patient remain on lasix 80 in AM and 40 in PM and call us back next Monday w how she is feeling.  She is in agreement w this plan.  She has follow up in afib clinic on 09/22/22.

## 2022-09-16 MED ORDER — AMIODARONE HCL 200 MG PO TABS: 200.0000 mg | ORAL_TABLET | Freq: Two times a day (BID) | ORAL | 1 refills | Status: DC

## 2022-09-16 NOTE — Addendum Note (Signed)
Addended by: Shona Simpson on: 09/16/2022 04:11 PM   Modules accepted: Orders

## 2022-09-16 NOTE — Telephone Encounter (Signed)
Please advise patient let's go ahead and reload amiodarone 200 mg BID x 1 month, f/u in about 3 weeks to reassess prior to possible repeat cardioversion after the reload if still in Afib.   Patient and daughter in agreement. Appt adjusted.

## 2022-09-20 NOTE — Telephone Encounter (Signed)
Patient is following up. She states she is doing  fine with medication changes and she feels good.

## 2022-09-22 ENCOUNTER — Ambulatory Visit (HOSPITAL_COMMUNITY): Payer: Medicare Other | Admitting: Internal Medicine

## 2022-10-01 ENCOUNTER — Telehealth: Payer: Self-pay | Admitting: Cardiovascular Disease

## 2022-10-01 NOTE — Telephone Encounter (Signed)
Pt c/o medication issue:  1. Name of Medication: furosemide (LASIX) 20 MG tablet   2. How are you currently taking this medication (dosage and times per day)? Take 80 mg by mouth every AM and take 40 mg by mouth every PM   3. Are you having a reaction (difficulty breathing--STAT)? No  4. What is your medication issue? Patient was wanting to inform RN Michalene that she is taking this medication 1 tablet at 4AM and two tablets 5 hours later. Patient states this has been working great for her and she is feeling better. Patient has an appointment with the Afib clinic on 10/02.

## 2022-10-01 NOTE — Telephone Encounter (Signed)
Noted.  I'm glad its working for her.

## 2022-10-06 ENCOUNTER — Other Ambulatory Visit (HOSPITAL_COMMUNITY): Payer: Self-pay | Admitting: Internal Medicine

## 2022-10-06 ENCOUNTER — Ambulatory Visit (HOSPITAL_COMMUNITY)
Admission: RE | Admit: 2022-10-06 | Discharge: 2022-10-06 | Disposition: A | Discharge status: Home / Self Care | Payer: Medicare Other | Source: Ambulatory Visit | Attending: Internal Medicine | Admitting: Internal Medicine

## 2022-10-06 VITALS — BP 134/72 | HR 44 | Ht 63.0 in | Wt 167.0 lb

## 2022-10-06 DIAGNOSIS — I2510 Atherosclerotic heart disease of native coronary artery without angina pectoris: Secondary | ICD-10-CM | POA: Diagnosis not present

## 2022-10-06 DIAGNOSIS — I5032 Chronic diastolic (congestive) heart failure: Secondary | ICD-10-CM | POA: Insufficient documentation

## 2022-10-06 DIAGNOSIS — Z79899 Other long term (current) drug therapy: Secondary | ICD-10-CM | POA: Insufficient documentation

## 2022-10-06 DIAGNOSIS — I110 Hypertensive heart disease with heart failure: Secondary | ICD-10-CM | POA: Diagnosis not present

## 2022-10-06 DIAGNOSIS — E785 Hyperlipidemia, unspecified: Secondary | ICD-10-CM | POA: Insufficient documentation

## 2022-10-06 DIAGNOSIS — Z7901 Long term (current) use of anticoagulants: Secondary | ICD-10-CM | POA: Insufficient documentation

## 2022-10-06 DIAGNOSIS — Z853 Personal history of malignant neoplasm of breast: Secondary | ICD-10-CM | POA: Diagnosis not present

## 2022-10-06 DIAGNOSIS — Z7902 Long term (current) use of antithrombotics/antiplatelets: Secondary | ICD-10-CM | POA: Insufficient documentation

## 2022-10-06 DIAGNOSIS — D6869 Other thrombophilia: Secondary | ICD-10-CM | POA: Insufficient documentation

## 2022-10-06 DIAGNOSIS — I4819 Other persistent atrial fibrillation: Secondary | ICD-10-CM | POA: Insufficient documentation

## 2022-10-06 DIAGNOSIS — I440 Atrioventricular block, first degree: Secondary | ICD-10-CM | POA: Insufficient documentation

## 2022-10-06 DIAGNOSIS — Z5181 Encounter for therapeutic drug level monitoring: Secondary | ICD-10-CM | POA: Diagnosis not present

## 2022-10-06 DIAGNOSIS — R001 Bradycardia, unspecified: Secondary | ICD-10-CM | POA: Diagnosis not present

## 2022-10-06 NOTE — Progress Notes (Signed)
Primary Care Physician: Blair Heys, MD Primary Cardiologist: Verne Carrow, MD Electrophysiologist: None     Referring Physician: Dr. Marva Panda is a 89 y.o. female with a history of CAD, HTN, HLD, SVT, breast cancer, chronic diastolic CHF, s/p TAVR, bilateral carotid artery disease, and paroxysmal atrial fibrillation who presents for consultation in the [facility]. Recent phone calls titrating diuresis for SOB and edema; most recent phone call noted to be in likely Afib with RVR. Patient is on Xarelto 15 mg daily for a CHADS2VASC score of 6.  On evaluation today, she is currently in Afib with RVR. She is taking lasix 80 mg AM and 40 mg PM currently as recommended by Dr. Clifton James per recent phone call notes. Her SOB and leg swelling is better than before but still ongoing.   On follow up 09/08/22, she is currently in Afib. She has ongoing SOB and edema which is not any better. She is still taking lasix 80 mg BID and K+ 20 daily. She is tired with activity. No missed doses of Xarelto 15 mg.   On follow up 10/06/22, she is currently in NSR. She currently is in sinus bradycardia. S/p successful DCCV on 09/10/22. She notes her swelling is improved. She is still a little short of breath at time.   Today, she denies symptoms of palpitations, chest pain, orthopnea, PND, dizziness, presyncope, syncope, snoring, daytime somnolence, bleeding, or neurologic sequela. The patient is tolerating medications without difficulties and is otherwise without complaint today.    she has a BMI of Body mass index is 29.58 kg/m.Marland Kitchen Filed Weights   10/06/22 0857  Weight: 75.8 kg     Current Outpatient Medications  Medication Sig Dispense Refill   acetaminophen (TYLENOL) 650 MG CR tablet Take 650 mg by mouth as needed.     amiodarone (PACERONE) 200 MG tablet Take 1 tablet (200 mg total) by mouth 2 (two) times daily. 60 tablet 1   atenolol (TENORMIN) 25  MG tablet Take 1 tablet (25 mg total) by mouth daily. 90 tablet 3   Calcium Carbonate-Vitamin D (CALCIUM 600/VITAMIN D PO) Take 1 tablet by mouth daily.     dextromethorphan (DELSYM) 30 MG/5ML liquid Take 30 mg by mouth at bedtime as needed for cough.     ergocalciferol (VITAMIN D2) 1.25 MG (50000 UT) capsule Take 50,000 Units by mouth once a week. Saturday's     fexofenadine (QC FEXOFENADINE HYDROCHLORIDE) 180 MG tablet Take 180 mg by mouth as needed.     furosemide (LASIX) 20 MG tablet Take 80 mg by mouth every AM and take 40 mg by mouth every PM 360 tablet 3   hydrocortisone 2.5 % cream Apply topically daily. (Patient taking differently: Apply 1 Application topically as needed (irritation).) 30 g 0   levothyroxine (SYNTHROID, LEVOTHROID) 112 MCG tablet Take 112 mcg by mouth daily before breakfast.     Magnesium 400 MG CAPS Take 400 mg by mouth daily.      Menthol, Topical Analgesic, (BIOFREEZE EX) Apply 1 application topically as needed (pain).      Multiple Vitamin (MULTIVITAMIN WITH MINERALS) TABS tablet Take 1 tablet by mouth daily.     potassium chloride (KLOR-CON M) 10 MEQ tablet Taken 2 tablets by mouth every morning and take one tablet by mouth every PM 180 tablet 2   Rivaroxaban (XARELTO) 15 MG TABS tablet Take 1 tablet (15 mg total) by mouth daily with supper. 90 tablet  3   vitamin C (ASCORBIC ACID) 500 MG tablet Take 500 mg by mouth daily.     No current facility-administered medications for this encounter.    Atrial Fibrillation Management history:  Previous antiarrhythmic drugs: amiodarone Previous cardioversions: 01/01/22, 09/10/22 Previous ablations: None Anticoagulation history: Xarelto 15 mg daily   ROS- All systems are reviewed and negative except as per the HPI above.  Physical Exam: BP 134/72   Pulse (!) 44   Ht 5\' 3"  (1.6 m)   Wt 75.8 kg   LMP  (LMP Unknown)   BMI 29.58 kg/m   GEN- The patient is well appearing, alert and oriented x 3 today.   Neck - no JVD  or carotid bruit noted Lungs- Clear to ausculation bilaterally, normal work of breathing Heart- Regular rate and rhythm, no murmurs, rubs or gallops, PMI not laterally displaced Extremities- no clubbing, cyanosis, or edema Skin - no rash or ecchymosis noted   EKG today demonstrates  Vent. rate 44 BPM PR interval 218 ms QRS duration 106 ms QT/QTcB 534/456 ms P-R-T axes 81 88 202 Marked sinus bradycardia with 1st degree A-V block ST & T wave abnormality, consider inferior ischemia ST & T wave abnormality, consider anterolateral ischemia Abnormal ECG When compared with ECG of 10-Sep-2022 10:45, PREVIOUS ECG IS PRESENT  Echo 01/01/22 demonstrated   1. Left ventricular ejection fraction, by estimation, is 50 to 55%. The  left ventricle has low normal function.   2. Right ventricular systolic function is normal. The right ventricular  size is normal.   3. Left atrial size was severely dilated. No left atrial/left atrial  appendage thrombus was detected. The LAA emptying velocity was 49 cm/s.   4. The mitral valve is degenerative. Mild to moderate mitral valve  regurgitation.   5. The aortic valve has been repaired/replaced. Aortic valve  regurgitation is trivial. There is a 26 mm Sapien prosthetic (TAVR) valve  present in the aortic position. Procedure Date: 07/30/2016. Echo findings  are consistent with normal structure and  function of the aortic valve prosthesis. Aortic valve mean gradient  measures 6.0 mmHg. Aortic valve Vmax measures 1.58 m/s.   6. There is Severe, mobile (Grade V) plaque involving the aortic arch and  ascending aorta (clip 78).   7. Following TEE, the patient underwent successful DCCV with 150J x1.    ASSESSMENT & PLAN CHA2DS2-VASc Score = 6  The patient's score is based upon: CHF History: 1 HTN History: 1 Diabetes History: 0 Stroke History: 0 Vascular Disease History: 1 Age Score: 2 Gender Score: 1      ASSESSMENT AND PLAN: Persistent Atrial  Fibrillation (ICD10:  I48.0)  The patient's CHA2DS2-VASc score is 6, indicating a 9.7% annual risk of stroke.   S/p successful DCCV on 09/10/22.  She is currently in NSR. Continue amiodarone 200 mg one tab daily. Stop atenolol. Monitor HR.   Continue current lasix regimen.    Secondary Hypercoagulable State (ICD10:  D68.69) The patient is at significant risk for stroke/thromboembolism based upon her CHA2DS2-VASc Score of 6.  Continue Rivaroxaban (Xarelto).  No missed doses     Follow up as scheduled with Cardiology.    Lake Bells, PA-C  [facility] [address] [place], [place] [address] [phone]

## 2022-10-06 NOTE — Patient Instructions (Addendum)
Please take amiodarone 200 mg one tab daily.   Please continue lasix regimen as directed by Dr. Clifton James.   Please stop atenolol.

## 2022-10-28 DIAGNOSIS — Z23 Encounter for immunization: Secondary | ICD-10-CM | POA: Diagnosis not present

## 2022-10-31 NOTE — Progress Notes (Unsigned)
Office Visit    Patient Name: [name] Date of Encounter: 11/01/2022  PCP:  Blair Heys, MD (Inactive)   [facility]  Cardiologist:  Verne Carrow, MD  Advanced Practice Provider:  No care team member to display Electrophysiologist:  None   HPI    [name] is a 89 y.o. female with nonobstructive CAD, aortic stenosis status post TAVR 07/27/2016, SVT, chronic diastolic heart failure, CKD some HLD, hypothyroidism, breast cancer status post lumpectomy and chemoradiation, carotid artery disease, who was seen 12/30/2021 for evaluation of A-fib with RVR presents today for follow-up appointment.  She was started on Eliquis 5 mg twice daily and transition to diltiazem 100 mg daily.  She had underwent successful TEE/DCCV with restoration of sinus rhythm.  She was not on a beta-blocker due to intolerance per office note 12/24/2021.  I saw her 01/2022, she tells me that after her cardioversion she had an imprint where the pad was placed. She thought it might have been a reaction to the adhesive. Before she knew it she had a severe rash all over her trunk, arms, neck, legs, and even scalp. Likely a drug reaction. She was started on Eliquis and Cardizem while in the hospital. She was hold to discontinue both asap and start on atenolol and xarelto. She denied SOB. Discussed case with PharmD which confirmed likely a drug rash. NSR today.   She was then seen 04/2022 by Dr. Clifton James and doing well at that time.  Today, she is doing well today without any further issues of atrial fibrillation.  She feels this made a huge difference in her energy.  She is also drinking tart cherry juice which she feels has helped.  No lightheadedness or dizziness.  Blood pressure low normal today but asymptomatic.  We briefly discussed better cholesterol control since her LDL is quite high.  However, she does not want to explore any nonstatin options at this moment.  Reports  no shortness of breath nor dyspnea on exertion. Reports no chest pain, pressure, or tightness. No edema, orthopnea, PND. Reports no palpitations.    Past Medical History    Past Medical History:  Diagnosis Date   Arthritis    hands, knees   CAD (coronary artery disease), native coronary artery cardiologist--- dr Clifton James   a. mild non obst CAD per cath 07-18-2015   Carotid artery disease (HCC)    Carotid US 09/2018: Bilateral ICA 40-59 // Carotid US 11/21: Bilateral ICA 40-59; repeat 1 year    Chronic diastolic CHF (congestive heart failure) (HCC)    followed by cardiology   CKD (chronic kidney disease), stage III (HCC)    followed by pcp   Dyspnea    per pt when she walks which is usual for her   History of gastric ulcer    remote yrs ago   History of rheumatic fever as a child    History of right breast cancer 2006   s/p right partial mastectomy w/ node dissection's,  completed chemo/ radiation same year  (05-21-2020 pt stated no recurrence)   Hyperlipidemia    Hypothyroidism    followed by pcp   Osteoporosis    PMB (postmenopausal bleeding)    PSVT (paroxysmal supraventricular tachycardia) (HCC) 10/2012   in ED resolved w/ vagal maneuver    S/P aortic valve replacement 07/27/2016   for severe stenosis;  last echo in epic 09-22-2018 ef 55-60%, mild TR, aortic valve area 1.4cm^2 and mean gradiant 17 mmHg  Seasonal allergies    Vitamin D deficiency    Past Surgical History:  Procedure Laterality Date   CARDIAC CATHETERIZATION N/A 07/18/2015   Procedure: Right/Left Heart Cath and Coronary Angiography;  Surgeon: Kathleene Hazel, MD;  Location: [facility] INVASIVE CV LAB;  Service: Cardiovascular;  Laterality: N/A;   CARDIOVERSION N/A 01/01/2022   Procedure: CARDIOVERSION;  Surgeon: Meriam Sprague, MD;  Location: [facility] ENDOSCOPY;  Service: Cardiovascular;  Laterality: N/A;   CARDIOVERSION N/A 09/10/2022   Procedure: CARDIOVERSION;  Surgeon: Wendall Stade, MD;  Location: [facility]  INVASIVE CV LAB;  Service: Cardiovascular;  Laterality: N/A;   CATARACT EXTRACTION W/ INTRAOCULAR LENS  IMPLANT, BILATERAL     2017   CHOLECYSTECTOMY N/A 08/06/2014   Procedure: LAPAROSCOPIC CHOLECYSTECTOMY ;  Surgeon: Emelia Loron, MD;  Location: [facility] OR;  Service: General;  Laterality: N/A;   HYSTEROSCOPY WITH D & C N/A 05/26/2020   Procedure: DILATATION AND CURETTAGE /HYSTEROSCOPY;  Surgeon: Romualdo Bolk, MD;  Location: [facility];  Service: Gynecology;  Laterality: N/A;  Request case for Cate   PARTIAL MASTECTOMY WITH AXILLARY SENTINEL LYMPH NODE BIOPSY Right 2006   TEE WITHOUT CARDIOVERSION N/A 07/27/2016   Procedure: TRANSESOPHAGEAL ECHOCARDIOGRAM (TEE);  Surgeon: Kathleene Hazel, MD;  Location: [facility] OR;  Service: Open Heart Surgery;  Laterality: N/A;   TEE WITHOUT CARDIOVERSION N/A 01/01/2022   Procedure: TRANSESOPHAGEAL ECHOCARDIOGRAM (TEE);  Surgeon: Meriam Sprague, MD;  Location: [facility] ENDOSCOPY;  Service: Cardiovascular;  Laterality: N/A;   THYROID SURGERY  1969   NODULE REMOVED, benign per pt   TONSILLECTOMY  age 7   TOTAL HIP ARTHROPLASTY Left 2003   TOTAL HIP ARTHROPLASTY Right 08/28/2013   Procedure: RIGHT TOTAL HIP ARTHROPLASTY ANTERIOR APPROACH;  Surgeon: Shelda Pal, MD;  Location: [facility] ORS;  Service: Orthopedics;  Laterality: Right;   TOTAL KNEE ARTHROPLASTY Right 01-16-2007  @[facility]    TRANSCATHETER AORTIC VALVE REPLACEMENT, TRANSFEMORAL N/A 07/27/2016   Procedure: TRANSCATHETER AORTIC VALVE REPLACEMENT, TRANSFEMORAL;  Surgeon: Kathleene Hazel, MD;  Location: [facility] OR;  Service: Open Heart Surgery;  Laterality: N/A;    Allergies  Allergies  Allergen Reactions   Atorvastatin Other (See Comments)    Myalgias    Compazine [Prochlorperazine] Other (See Comments)    Unknown, pt does not remember   Floxin [Ofloxacin] Other (See Comments)    Unknown, pt does not remember reaction but was allergic reaction   Livalo [Pitavastatin] Other (See  Comments)    Leg pain   Pravastatin Sodium Other (See Comments)    mylgias    Simvastatin Other (See Comments)    Elevated lft's 06/2011   Fenofibrate Rash    Rash     EKGs/Labs/Other Studies Reviewed:   The following studies were reviewed today:  TEE: 01/01/2022   IMPRESSIONS     1. Left ventricular ejection fraction, by estimation, is 50 to 55%. The  left ventricle has low normal function.   2. Right ventricular systolic function is normal. The right ventricular  size is normal.   3. Left atrial size was severely dilated. No left atrial/left atrial  appendage thrombus was detected. The LAA emptying velocity was 49 cm/s.   4. The mitral valve is degenerative. Mild to moderate mitral valve  regurgitation.   5. The aortic valve has been repaired/replaced. Aortic valve  regurgitation is trivial. There is a 26 mm Sapien prosthetic (TAVR) valve  present in the aortic position. Procedure Date: 07/30/2016. Echo findings  are consistent with normal structure and  function of the aortic valve prosthesis. Aortic valve mean gradient  measures 6.0 mmHg. Aortic valve Vmax measures 1.58 m/s.   6. There is Severe, mobile (Grade V) plaque involving the aortic arch and  ascending aorta (clip 78).   7. Following TEE, the patient underwent successful DCCV with 150J x1.   FINDINGS   Left Ventricle: Left ventricular ejection fraction, by estimation, is 50  to 55%. The left ventricle has low normal function. The left ventricular  internal cavity size was normal in size.   Right Ventricle: The right ventricular size is normal. No increase in  right ventricular wall thickness. Right ventricular systolic function is  normal.   Left Atrium: Left atrial size was severely dilated. No left atrial/left  atrial appendage thrombus was detected. The LAA emptying velocity was 49  cm/s.   Right Atrium: Right atrial size was normal in size.   Pericardium: There is no evidence of pericardial effusion.    Mitral Valve: The mitral valve is degenerative in appearance. There is  mild thickening of the mitral valve leaflet(s). There is mild  calcification of the mitral valve leaflet(s). Mild to moderate mitral  valve regurgitation.   Tricuspid Valve: The tricuspid valve is normal in structure. Tricuspid  valve regurgitation is trivial.   Aortic Valve: The aortic valve has been repaired/replaced. Aortic valve  regurgitation is trivial. Aortic valve mean gradient measures 6.0 mmHg.  Aortic valve peak gradient measures 10.0 mmHg. There is a 26 mm Sapien  prosthetic, stented (TAVR) valve  present in the aortic position. Procedure Date: 07/30/2016. Echo findings  are consistent with normal structure and function of the aortic valve  prosthesis.   Pulmonic Valve: The pulmonic valve was normal in structure. Pulmonic valve  regurgitation is trivial.   Aorta: There is severe, mobile (Grade V) plaque involving the aortic arch  and ascending aorta.   IAS/Shunts: The atrial septum is grossly normal.   Additional Comments: Spectral Doppler performed.     EKG:  EKG is not ordered today.  Recent Labs: 12/30/2021: TSH 3.975 12/31/2021: B Natriuretic Peptide 338.8; Magnesium 2.3 09/08/2022: ALT 17; Platelets 194 09/10/2022: BUN 43; Creatinine, Ser 1.60; Hemoglobin 13.6; Potassium 4.0; Sodium 139  Recent Lipid Panel No results found for: "CHOL", "TRIG", "HDL", "CHOLHDL", "VLDL", "LDLCALC", "LDLDIRECT"  Risk Assessment/Calculations:   CHA2DS2-VASc Score = 6   This indicates a 9.7% annual risk of stroke. The patient's score is based upon: CHF History: 1 HTN History: 1 Diabetes History: 0 Stroke History: 0 Vascular Disease History: 1 Age Score: 2 Gender Score: 1     Home Medications   Current Meds  Medication Sig   acetaminophen (TYLENOL) 650 MG CR tablet Take 650 mg by mouth as needed.   amiodarone (PACERONE) 200 MG tablet Take 1 tablet (200 mg total) by mouth daily.   Calcium  Carbonate-Vitamin D (CALCIUM 600/VITAMIN D PO) Take 1 tablet by mouth daily.   dextromethorphan (DELSYM) 30 MG/5ML liquid Take 30 mg by mouth at bedtime as needed for cough.   ergocalciferol (VITAMIN D2) 1.25 MG (50000 UT) capsule Take 50,000 Units by mouth once a week. Saturday's   fexofenadine (QC FEXOFENADINE HYDROCHLORIDE) 180 MG tablet Take 180 mg by mouth as needed.   furosemide (LASIX) 20 MG tablet Take 80 mg by mouth every AM and take 40 mg by mouth every PM   hydrocortisone 2.5 % cream Apply topically daily. (Patient taking differently: Apply 1 Application topically as needed (irritation).)   levothyroxine (SYNTHROID, LEVOTHROID) 112 MCG  tablet Take 112 mcg by mouth daily before breakfast.   Magnesium 400 MG CAPS Take 400 mg by mouth daily.    Menthol, Topical Analgesic, (BIOFREEZE EX) Apply 1 application topically as needed (pain).    potassium chloride (KLOR-CON M) 10 MEQ tablet Taken 2 tablets by mouth every morning and take one tablet by mouth every PM   potassium chloride (MICRO-K) 10 MEQ CR capsule Take 10 mEq by mouth 2 (two) times daily. Pt takes 10 meq 1 tablet in the morning, and 1 tablet at night.   Rivaroxaban (XARELTO) 15 MG TABS tablet Take 1 tablet (15 mg total) by mouth daily with supper.   vitamin C (ASCORBIC ACID) 500 MG tablet Take 500 mg by mouth daily.     Review of Systems      All other systems reviewed and are otherwise negative except as noted above.  Physical Exam    VS:  BP 102/70   Pulse 67   Ht 5\' 3"  (1.6 m)   Wt 161 lb (73 kg)   LMP  (LMP Unknown)   SpO2 94%   BMI 28.52 kg/m  , BMI Body mass index is 28.52 kg/m.  Wt Readings from Last 3 Encounters:  11/01/22 161 lb (73 kg)  10/06/22 167 lb (75.8 kg)  09/10/22 172 lb 6.4 oz (78.2 kg)     GEN: skin on torso, arms, neck, scalp, and legs with a maculopapular bright red rash that is raised and  HEENT: normal. Neck: Supple, no JVD, carotid bruits, or masses. Cardiac: RRR, no murmurs, rubs, or  gallops. No clubbing, cyanosis, 1-2 + non pitting R > Ledema.  Radials/PT 2+ and equal bilaterally.  Respiratory:  Respirations regular and unlabored, clear to auscultation bilaterally. GI: Soft, nontender, nondistended. MS: No deformity or atrophy. Skin: Warm and dry, no rash. Neuro:  Strength and sensation are intact. Psych: Normal affect.  Assessment & Plan     A-fib with RVR, new diagnosis/history of PVC, PAC, NSVT -s/p DCCV -NSR -changed to xarelto and atenolol due to drug reaction  Nonobstructive CAD -asymptomatic at this time -continue current medication regimen  Chronic diastolic heart failure/history of aortic stenosis status post TAVR -euvolemic on exam -continue current medications -no symptoms -We have ordered a BMP and BNP -Creatinine was normal a few months ago and is now 1.6, may need a diuretic adjustment  Aortic atherosclerosis -LDL 181 -LDL goal < 70 -looks like she has an intolerance to statins -Currently not open to adding any new medication       Disposition: Follow up 6 month with Verne Carrow, MD or APP.  Signed, Sharlene Dory, PA-C 11/01/2022, 9:08 AM [facility]

## 2022-11-01 ENCOUNTER — Ambulatory Visit: Payer: Medicare Other | Attending: Physician Assistant | Admitting: Physician Assistant

## 2022-11-01 ENCOUNTER — Encounter: Payer: Self-pay | Admitting: Physician Assistant

## 2022-11-01 VITALS — BP 102/70 | HR 67 | Ht 63.0 in | Wt 161.0 lb

## 2022-11-01 DIAGNOSIS — I480 Paroxysmal atrial fibrillation: Secondary | ICD-10-CM | POA: Diagnosis not present

## 2022-11-01 DIAGNOSIS — I359 Nonrheumatic aortic valve disorder, unspecified: Secondary | ICD-10-CM | POA: Insufficient documentation

## 2022-11-01 DIAGNOSIS — I700 Atherosclerosis of aorta: Secondary | ICD-10-CM | POA: Insufficient documentation

## 2022-11-01 DIAGNOSIS — D6869 Other thrombophilia: Secondary | ICD-10-CM | POA: Insufficient documentation

## 2022-11-01 DIAGNOSIS — I5032 Chronic diastolic (congestive) heart failure: Secondary | ICD-10-CM | POA: Insufficient documentation

## 2022-11-01 DIAGNOSIS — I4891 Unspecified atrial fibrillation: Secondary | ICD-10-CM | POA: Diagnosis not present

## 2022-11-01 DIAGNOSIS — Z952 Presence of prosthetic heart valve: Secondary | ICD-10-CM | POA: Insufficient documentation

## 2022-11-01 NOTE — Patient Instructions (Addendum)
Medication Instructions:  Your physician recommends that you continue on your current medications as directed. Please refer to the Current Medication list given to you today.  *If you need a refill on your cardiac medications before your next appointment, please call your pharmacy*   Lab Work: BMET, PRO BNP - Today    If you have labs (blood work) drawn today and your tests are completely normal, you will receive your results only by: MyChart Message (if you have MyChart) OR A paper copy in the mail If you have any lab test that is abnormal or we need to change your treatment, we will call you to review the results.   Testing/Procedures: None ordered    Follow-Up: At [facility], you and your health needs are our priority.  As part of our continuing mission to provide you with exceptional heart care, we have created designated Provider Care Teams.  These Care Teams include your primary Cardiologist (physician) and Advanced Practice Providers (APPs -  Physician Assistants and Nurse Practitioners) who all work together to provide you with the care you need, when you need it.  We recommend signing up for the patient portal called "MyChart".  Sign up information is provided on this After Visit Summary.  MyChart is used to connect with patients for Virtual Visits (Telemedicine).  Patients are able to view lab/test results, encounter notes, upcoming appointments, etc.  Non-urgent messages can be sent to your provider as well.   To learn more about what you can do with MyChart, go to [email or web address].    Your next appointment:   6 month(s)  Provider:   Verne Carrow, MD     Other Instructions Weigh yourself daily   Your physician has recommended that you purchase ted hose    Low-Sodium Eating Plan Salt (sodium) helps you keep a healthy balance of fluids in your body. Too much sodium can raise your blood pressure. It can also cause fluid and waste to be held in  your body. Your health care provider or dietitian may recommend a low-sodium eating plan if you have high blood pressure (hypertension), kidney disease, liver disease, or heart failure. Eating less sodium can help lower your blood pressure and reduce swelling. It can also protect your heart, liver, and kidneys. What are tips for following this plan? Reading food labels  Check food labels for the amount of sodium per serving. If you eat more than one serving, you must multiply the listed amount by the number of servings. Choose foods with less than 140 milligrams (mg) of sodium per serving. Avoid foods with 300 mg of sodium or more per serving. Always check how much sodium is in a product, even if the label says "unsalted" or "no salt added." Shopping  Buy products labeled as "low-sodium" or "no salt added." Buy fresh foods. Avoid canned foods and pre-made or frozen meals. Avoid canned, cured, or processed meats. Buy breads that have less than 80 mg of sodium per slice. Cooking  Eat more home-cooked food. Try to eat less restaurant, buffet, and fast food. Try not to add salt when you cook. Use salt-free seasonings or herbs instead of table salt or sea salt. Check with your provider or pharmacist before using salt substitutes. Cook with plant-based oils, such as canola, sunflower, or olive oil. Meal planning When eating at a restaurant, ask if your food can be made with less salt or no salt. Avoid dishes labeled as brined, pickled, cured, or smoked. Avoid dishes made  with soy sauce, miso, or teriyaki sauce. Avoid foods that have monosodium glutamate (MSG) in them. MSG may be added to some restaurant food, sauces, soups, bouillon, and canned foods. Make meals that can be grilled, baked, poached, roasted, or steamed. These are often made with less sodium. General information Try to limit your sodium intake to 1,500-2,300 mg each day, or the amount told by your provider. What foods should I  eat? Fruits Fresh, frozen, or canned fruit. Fruit juice. Vegetables Fresh or frozen vegetables. "No salt added" canned vegetables. "No salt added" tomato sauce and paste. Low-sodium or reduced-sodium tomato and vegetable juice. Grains Low-sodium cereals, such as oats, puffed wheat and rice, and shredded wheat. Low-sodium crackers. Unsalted rice. Unsalted pasta. Low-sodium bread. Whole grain breads and whole grain pasta. Meats and other proteins Fresh or frozen meat, poultry, seafood, and fish. These should have no added salt. Low-sodium canned tuna and salmon. Unsalted nuts. Dried peas, beans, and lentils without added salt. Unsalted canned beans. Eggs. Unsalted nut butters. Dairy Milk. Soy milk. Cheese that is naturally low in sodium, such as ricotta cheese, fresh mozzarella, or Swiss cheese. Low-sodium or reduced-sodium cheese. Cream cheese. Yogurt. Seasonings and condiments Fresh and dried herbs and spices. Salt-free seasonings. Low-sodium mustard and ketchup. Sodium-free salad dressing. Sodium-free light mayonnaise. Fresh or refrigerated horseradish. Lemon juice. Vinegar. Other foods Homemade, reduced-sodium, or low-sodium soups. Unsalted popcorn and pretzels. Low-salt or salt-free chips. The items listed above may not be all the foods and drinks you can have. Talk to a dietitian to learn more. What foods should I avoid? Vegetables Sauerkraut, pickled vegetables, and relishes. Olives. [place] fries. Onion rings. Regular canned vegetables, except low-sodium or reduced-sodium items. Regular canned tomato sauce and paste. Regular tomato and vegetable juice. Frozen vegetables in sauces. Grains Instant hot cereals. Bread stuffing, pancake, and biscuit mixes. Croutons. Seasoned rice or pasta mixes. Noodle soup cups. Boxed or frozen macaroni and cheese. Regular salted crackers. Self-rising flour. Meats and other proteins Meat or fish that is salted, canned, smoked, spiced, or pickled. Precooked or  cured meat, such as sausages or meat loaves. [name]. Ham. Pepperoni. Hot dogs. Corned beef. Chipped beef. Salt pork. Jerky. Pickled herring, anchovies, and sardines. Regular canned tuna. Salted nuts. Dairy Processed cheese and cheese spreads. Hard cheeses. Cheese curds. Blue cheese. Feta cheese. String cheese. Regular cottage cheese. Buttermilk. Canned milk. Fats and oils Salted butter. Regular margarine. Ghee. Bacon fat. Seasonings and condiments Onion salt, garlic salt, seasoned salt, table salt, and sea salt. Canned and packaged gravies. Worcestershire sauce. Tartar sauce. Barbecue sauce. Teriyaki sauce. Soy sauce, including reduced-sodium soy sauce. Steak sauce. Fish sauce. Oyster sauce. Cocktail sauce. Horseradish that you find on the shelf. Regular ketchup and mustard. Meat flavorings and tenderizers. Bouillon cubes. Hot sauce. Pre-made or packaged marinades. Pre-made or packaged taco seasonings. Relishes. Regular salad dressings. Salsa. Other foods Salted popcorn and pretzels. Corn chips and puffs. Potato and tortilla chips. Canned or dried soups. Pizza. Frozen entrees and pot pies. The items listed above may not be all the foods and drinks you should avoid. Talk to a dietitian to learn more. This information is not intended to replace advice given to you by your health care provider. Make sure you discuss any questions you have with your health care provider. Document Revised: 01/07/2022 Document Reviewed: 01/07/2022 Elsevier Patient Education  2024 Elsevier Inc.   Heart-Healthy Eating Plan Eating a healthy diet is important for the health of your heart. A heart-healthy eating plan includes: Eating less  unhealthy fats. Eating more healthy fats. Eating less salt in your food. Salt is also called sodium. Making other changes in your diet. Talk with your doctor or a diet specialist (dietitian) to create an eating plan that is right for you. What is my plan? Your doctor may recommend an  eating plan that includes: Total fat: ______% or less of total calories a day. Saturated fat: ______% or less of total calories a day. Cholesterol: less than _________mg a day. Sodium: less than _________mg a day. What are tips for following this plan? Cooking Avoid frying your food. Try to bake, boil, grill, or broil it instead. You can also reduce fat by: Removing the skin from poultry. Removing all visible fats from meats. Steaming vegetables in water or broth. Meal planning  At meals, divide your plate into four equal parts: Fill one-half of your plate with vegetables and green salads. Fill one-fourth of your plate with whole grains. Fill one-fourth of your plate with lean protein foods. Eat 2-4 cups of vegetables per day. One cup of vegetables is: 1 cup (91 g) broccoli or cauliflower florets. 2 medium carrots. 1 large bell pepper. 1 large sweet potato. 1 large tomato. 1 medium white potato. 2 cups (150 g) raw leafy greens. Eat 1-2 cups of fruit per day. One cup of fruit is: 1 small apple 1 large banana 1 cup (237 g) mixed fruit, 1 large orange,  cup (82 g) dried fruit, 1 cup (240 mL) 100% fruit juice. Eat more foods that have soluble fiber. These are apples, broccoli, carrots, beans, peas, and barley. Try to get 20-30 g of fiber per day. Eat 4-5 servings of nuts, legumes, and seeds per week: 1 serving of dried beans or legumes equals  cup (90 g) cooked. 1 serving of nuts is  oz (12 almonds, 24 pistachios, or 7 walnut halves). 1 serving of seeds equals  oz (8 g). General information Eat more home-cooked food. Eat less restaurant, buffet, and fast food. Limit or avoid alcohol. Limit foods that are high in starch and sugar. Avoid fried foods. Lose weight if you are overweight. Keep track of how much salt (sodium) you eat. This is important if you have high blood pressure. Ask your doctor to tell you more about this. Try to add vegetarian meals each  week. Fats Choose healthy fats. These include olive oil and canola oil, flaxseeds, walnuts, almonds, and seeds. Eat more omega-3 fats. These include salmon, mackerel, sardines, tuna, flaxseed oil, and ground flaxseeds. Try to eat fish at least 2 times each week. Check food labels. Avoid foods with trans fats or high amounts of saturated fat. Limit saturated fats. These are often found in animal products, such as meats, butter, and cream. These are also found in plant foods, such as palm oil, palm kernel oil, and coconut oil. Avoid foods with partially hydrogenated oils in them. These have trans fats. Examples are stick margarine, some tub margarines, cookies, crackers, and other baked goods. What foods should I eat? Fruits All fresh, canned (in natural juice), or frozen fruits. Vegetables Fresh or frozen vegetables (raw, steamed, roasted, or grilled). Green salads. Grains Most grains. Choose whole wheat and whole grains most of the time. Rice and pasta, including brown rice and pastas made with whole wheat. Meats and other proteins Lean, well-trimmed beef, veal, pork, and lamb. Chicken and [place] without skin. All fish and shellfish. Wild duck, rabbit, pheasant, and venison. Egg whites or low-cholesterol egg substitutes. Dried beans, peas, lentils, and  tofu. Seeds and most nuts. Dairy Low-fat or nonfat cheeses, including ricotta and mozzarella. Skim or 1% milk that is liquid, powdered, or evaporated. Buttermilk that is made with low-fat milk. Nonfat or low-fat yogurt. Fats and oils Non-hydrogenated (trans-free) margarines. Vegetable oils, including soybean, sesame, sunflower, olive, peanut, safflower, corn, canola, and cottonseed. Salad dressings or mayonnaise made with a vegetable oil. Beverages Mineral water. Coffee and tea. Diet carbonated beverages. Sweets and desserts Sherbet, gelatin, and fruit ice. Small amounts of dark chocolate. Limit all sweets and desserts. Seasonings and  condiments All seasonings and condiments. The items listed above may not be a complete list of foods and drinks you can eat. Contact a dietitian for more options. What foods should I avoid? Fruits Canned fruit in heavy syrup. Fruit in cream or butter sauce. Fried fruit. Limit coconut. Vegetables Vegetables cooked in cheese, cream, or butter sauce. Fried vegetables. Grains Breads that are made with saturated or trans fats, oils, or whole milk. Croissants. Sweet rolls. Donuts. High-fat crackers, such as cheese crackers. Meats and other proteins Fatty meats, such as hot dogs, ribs, sausage, bacon, rib-eye roast or steak. High-fat deli meats, such as salami and bologna. Caviar. Domestic duck and goose. Organ meats, such as liver. Dairy Cream, sour cream, cream cheese, and creamed cottage cheese. Whole-milk cheeses. Whole or 2% milk that is liquid, evaporated, or condensed. Whole buttermilk. Cream sauce or high-fat cheese sauce. Yogurt that is made from whole milk. Fats and oils Meat fat, or shortening. Cocoa butter, hydrogenated oils, palm oil, coconut oil, palm kernel oil. Solid fats and shortenings, including bacon fat, salt pork, lard, and butter. Nondairy cream substitutes. Salad dressings with cheese or sour cream. Beverages Regular sodas and juice drinks with added sugar. Sweets and desserts Frosting. Pudding. Cookies. Cakes. Pies. Milk chocolate or white chocolate. Buttered syrups. Full-fat ice cream or ice cream drinks. The items listed above may not be a complete list of foods and drinks to avoid. Contact a dietitian for more information. Summary Heart-healthy meal planning includes eating less unhealthy fats, eating more healthy fats, and making other changes in your diet. Eat a balanced diet. This includes fruits and vegetables, low-fat or nonfat dairy, lean protein, nuts and legumes, whole grains, and heart-healthy oils and fats. This information is not intended to replace advice  given to you by your health care provider. Make sure you discuss any questions you have with your health care provider. Document Revised: 01/26/2021 Document Reviewed: 01/26/2021 Elsevier Patient Education  2024 [employer].

## 2022-11-02 ENCOUNTER — Telehealth: Payer: Self-pay

## 2022-11-02 DIAGNOSIS — I5032 Chronic diastolic (congestive) heart failure: Secondary | ICD-10-CM

## 2022-11-02 DIAGNOSIS — Z79899 Other long term (current) drug therapy: Secondary | ICD-10-CM

## 2022-11-02 LAB — BASIC METABOLIC PANEL
BUN/Creatinine Ratio: 23 (ref 12–28)
BUN: 34 mg/dL — ABNORMAL HIGH (ref 8–27)
CO2: 29 mmol/L (ref 20–29)
Calcium: 9.9 mg/dL (ref 8.7–10.3)
Chloride: 95 mmol/L — ABNORMAL LOW (ref 96–106)
Creatinine, Ser: 1.51 mg/dL — ABNORMAL HIGH (ref 0.57–1.00)
Glucose: 88 mg/dL (ref 70–99)
Potassium: 3.8 mmol/L (ref 3.5–5.2)
Sodium: 141 mmol/L (ref 134–144)
eGFR: 33 mL/min/{1.73_m2} — ABNORMAL LOW (ref >59)

## 2022-11-02 LAB — PRO B NATRIURETIC PEPTIDE: NT-Pro BNP: 348 pg/mL (ref 0–738)

## 2022-11-02 MED ORDER — FUROSEMIDE 20 MG PO TABS: 40.0000 mg | ORAL_TABLET | Freq: Two times a day (BID) | ORAL | 3 refills | Status: DC

## 2022-11-02 NOTE — Telephone Encounter (Signed)
Call to patient to discuss elevated kidney function. Patient verbalizes understanding of elevated creatinine and agrees to decrease lasix dose to 40 mg BID. Labs ordered/set up for 11/09/22. Med list updated.

## 2022-11-02 NOTE — Telephone Encounter (Signed)
-----   Message from Sharlene Dory sent at 11/02/2022  1:57 PM EDT ----- Ms. Sloan,  Your kidney function is still elevated.  Your blood pressure was borderline low when I saw you in the clinic.  I would like to try you on a lower dose of diuretics (40 mg of Lasix in the morning and 40 mg of Lasix in the afternoon). We need to check labs in a week (BMP and BNP) to make sure your kidneys are recovering and your fluid level lab remains normal. Please let me know if you have questions.  Sharlene Dory, PA-C

## 2022-11-09 ENCOUNTER — Other Ambulatory Visit (HOSPITAL_BASED_OUTPATIENT_CLINIC_OR_DEPARTMENT_OTHER): Payer: Self-pay | Admitting: Cardiology

## 2022-11-09 ENCOUNTER — Ambulatory Visit: Payer: Medicare Other | Attending: Cardiology

## 2022-11-09 DIAGNOSIS — Z79899 Other long term (current) drug therapy: Secondary | ICD-10-CM | POA: Diagnosis not present

## 2022-11-09 DIAGNOSIS — I5032 Chronic diastolic (congestive) heart failure: Secondary | ICD-10-CM | POA: Diagnosis not present

## 2022-11-10 ENCOUNTER — Telehealth: Payer: Self-pay

## 2022-11-10 LAB — BASIC METABOLIC PANEL
BUN/Creatinine Ratio: 18 (ref 12–28)
BUN: 24 mg/dL (ref 8–27)
CO2: 29 mmol/L (ref 20–29)
Calcium: 9.5 mg/dL (ref 8.7–10.3)
Chloride: 97 mmol/L (ref 96–106)
Creatinine, Ser: 1.3 mg/dL — ABNORMAL HIGH (ref 0.57–1.00)
Glucose: 89 mg/dL (ref 70–99)
Potassium: 4.1 mmol/L (ref 3.5–5.2)
Sodium: 139 mmol/L (ref 134–144)
eGFR: 40 mL/min/{1.73_m2} — ABNORMAL LOW (ref >59)

## 2022-11-10 LAB — PRO B NATRIURETIC PEPTIDE: NT-Pro BNP: 554 pg/mL (ref 0–738)

## 2022-11-10 NOTE — Telephone Encounter (Signed)
Called to discuss results, no answer. # on DPR was not set up for VM, called another # listed in epic and left message with no identifiers asking patient to call [place] at our office number.

## 2022-11-10 NOTE — Telephone Encounter (Signed)
-----   Message from Nurse Kinnie Feil C sent at 11/10/2022  7:42 AM EST -----  ----- Message ----- From: Quintella Reichert, MD Sent: 11/10/2022   7:39 AM EST To: Frutoso Schatz, RN  Stable labs - continue current meds and forward to PCP

## 2022-11-10 NOTE — Telephone Encounter (Signed)
Discussed stable labs with patient who verbalizes understanding to continue current meds. Labs forwarded to PCP.

## 2022-12-04 ENCOUNTER — Other Ambulatory Visit: Payer: Self-pay | Admitting: Physician Assistant

## 2022-12-06 NOTE — Telephone Encounter (Signed)
Prescription refill request for Xarelto received.  Indication:afib Last office visit:10/24 Weight:73  kg Age:89 Scr:1.30  11/24 CrCl:33.81  ml/min  Prescription refilled

## 2022-12-07 ENCOUNTER — Other Ambulatory Visit: Payer: Self-pay | Admitting: Physician Assistant

## 2022-12-08 DIAGNOSIS — M1712 Unilateral primary osteoarthritis, left knee: Secondary | ICD-10-CM | POA: Diagnosis not present

## 2023-01-14 DIAGNOSIS — E039 Hypothyroidism, unspecified: Secondary | ICD-10-CM | POA: Diagnosis not present

## 2023-01-14 DIAGNOSIS — Z0000 Encounter for general adult medical examination without abnormal findings: Secondary | ICD-10-CM | POA: Diagnosis not present

## 2023-01-14 DIAGNOSIS — N183 Chronic kidney disease, stage 3 unspecified: Secondary | ICD-10-CM | POA: Diagnosis not present

## 2023-01-14 DIAGNOSIS — I4821 Permanent atrial fibrillation: Secondary | ICD-10-CM | POA: Diagnosis not present

## 2023-01-14 DIAGNOSIS — I679 Cerebrovascular disease, unspecified: Secondary | ICD-10-CM | POA: Diagnosis not present

## 2023-01-14 DIAGNOSIS — I10 Essential (primary) hypertension: Secondary | ICD-10-CM | POA: Diagnosis not present

## 2023-01-28 ENCOUNTER — Other Ambulatory Visit (HOSPITAL_COMMUNITY): Payer: Self-pay | Admitting: Internal Medicine

## 2023-03-14 ENCOUNTER — Telehealth: Payer: Self-pay | Admitting: Cardiovascular Disease

## 2023-03-14 NOTE — Telephone Encounter (Signed)
 STAT if patient feels like he/she is going to faint   1. Are you feeling dizzy, lightheaded, or faint right now? Yes     2. Have you passed out?  No  (If yes move to [facility])   3. Do you have any other symptoms? SOB   4. Have you checked your HR and BP (record if available)? 43 BPM

## 2023-03-14 NOTE — Telephone Encounter (Addendum)
 Spoke with patient and states she has been having dizzy spells for  2-3 weeks. Her HR has been low as well and she feels tired. HR currently at 43. She is not able to check BP at home.  She says her watch has not indicated AFIB. Her ankles have swelling. Denies any chest pain. She states she always have SOB that is not unusual. Informed patient her low HR can cause her to feel tired and dizzy. Atenolol can also cause her dizziness. ED precautions discussed Will forward to provider.

## 2023-03-15 NOTE — Progress Notes (Unsigned)
 Cardiology Office Note:   Date:  03/16/2023  ID:  [name], DOB 01-Dec-1931, MRN [id number] PCP:  Debroah Loop, DO  [facility] Providers Cardiologist:  Alverda Skeans, MD Referring MD: No ref. provider found  Chief Complaint/Reason for Referral: Dizziness ASSESSMENT:    1. Bradycardia   2. S/P TAVR (transcatheter aortic valve replacement)   3. PAF (paroxysmal atrial fibrillation) (HCC)   4. Secondary hypercoagulable state (HCC)   5. Coronary artery disease involving native coronary artery of native heart without angina pectoris   6. Hyperlipidemia LDL goal <70   7. Aortic atherosclerosis (HCC)   8. Stage 3b chronic kidney disease (HCC)   9. Edema, unspecified type     PLAN:   In order of problems listed above: Bradycardia: DC atenolol today.  Check reflex TSH.  Will place monitor and have patient return for follow-up visit next week after monitor results are available.  If she develops atrial fibrillation after discontinuation of atenolol this may suggest sick sinus syndrome and EP referral would be considered.  Fortunately she has not sustained any falls. Status post TAVR: Most recent echocardiogram demonstrated adequate valve function.  Continue Xarelto 15 mg daily. PAF: Continue Xarelto 15 mg daily and d/c atenolol. Secondary hypercoagulable state: Continue Xarelto 15 mg daily Coronary artery disease: Mild on last cardiac catheterization in 2017.  Continue Xarelto 15 mg; defer intensive lipid-lowering in this patient of advanced age. Hyperlipidemia: Defer intensive lipid-lowering in this patient of advanced age.  She is intolerant to multiple statins. Aortic atherosclerosis: Continue Xarelto 15 mg and will of aspirin and defer statins due to multiple intolerances. CKD stage IIIb:  Monitor for now. Edema: Will defer initiation of Lasix until heart rate and dizziness issues are managed.        Dispo:  Return in about 1 week (around 03/23/2023).      Medication  Adjustments/Labs and Tests Ordered: Current medicines are reviewed at length with the patient today.  Concerns regarding medicines are outlined above.  The following changes have been made:     Labs/tests ordered: Orders Placed This Encounter  Procedures   TSH Rfx on Abnormal to Free T4   LONG TERM MONITOR (3-14 DAYS)   EKG 12-Lead    Medication Changes: No orders of the defined types were placed in this encounter.   Current medicines are reviewed at length with the patient today.  The patient does not have concerns regarding medicines.  I spent 38 minutes reviewing all clinical data during and prior to this visit including all relevant imaging studies, laboratories, clinical information from other health systems and prior notes from both Cardiology and other specialties, interviewing the patient, conducting a complete physical examination, and coordinating care in order to formulate a comprehensive and personalized evaluation and treatment plan.   History of Present Illness:      FOCUSED PROBLEM LIST:   Aortic stenosis 26 mm SAPIEN 3 2018 PAF CV 2 score 6 On Xarelto TEE cardioversion December 2023  Cardioversion September 2024 CAD Mild, cath 2017 Hyperlipidemia Intolerant of multiple statins Aortic atherosclerosis CT abdomen pelvis 2023 CKD stage IIIb Diastolic dysfunction G2 DD, , status post TAVR MG 10, EF 55 to 60% TTE 2023 First-degree AV block  March 2025:  Patient consents to use of AI scribe. The patient is a 89 year old female who follows with Dr. Clifton George.  She called the office yesterday with reports of dizzy spells over the last few weeks.  Her heart rate was in the 40s.  Her smart watch did not indicate atrial fibrillation.  Her watch has indicated occasional bradycardia.  For this reason she is referred for an expedited follow-up. She has been experiencing dizziness for about a week and a half. Initially, the dizziness occurred in the mornings and resolved by  noon, but in the last few days, it has persisted throughout the day. Today, she feels particularly dizzy. Her heart rate has been low, around 43 bpm, which she monitors using her phone. A month ago, her heart rate was around 70 bpm. No blacking out spells, but she feels dizzy at times, requiring assistance when walking. No falls or head injuries have occurred.  Her swelling is a little bit worse today.  She has not been on Lasix.  She has a history of atrial fibrillation and underwent a TAVR procedure in 2018. She is currently on Xarelto for atrial fibrillation and takes atenolol in the morning along with other medications, and Xarelto at night          Current Medications: Current Meds  Medication Sig   acetaminophen (TYLENOL) 650 MG CR tablet Take 650 mg by mouth as needed.   amiodarone (PACERONE) 200 MG tablet Take 1 tablet (200 mg total) by mouth daily.   Calcium Carbonate-Vitamin D (CALCIUM 600/VITAMIN D PO) Take 1 tablet by mouth daily.   dextromethorphan (DELSYM) 30 MG/5ML liquid Take 30 mg by mouth at bedtime as needed for cough.   fexofenadine (QC FEXOFENADINE HYDROCHLORIDE) 180 MG tablet Take 180 mg by mouth as needed.   hydrocortisone 2.5 % cream Apply topically daily. (Patient taking differently: Apply 1 Application topically as needed (irritation).)   levothyroxine (SYNTHROID, LEVOTHROID) 112 MCG tablet Take 112 mcg by mouth daily before breakfast.   Magnesium 400 MG CAPS Take 400 mg by mouth daily.    Menthol, Topical Analgesic, (BIOFREEZE EX) Apply 1 application topically as needed (pain).    potassium chloride (KLOR-CON M) 10 MEQ tablet Taken 2 tablets by mouth every morning and take one tablet by mouth every PM   vitamin C (ASCORBIC ACID) 500 MG tablet Take 500 mg by mouth daily.   XARELTO 15 MG TABS tablet TAKE 1 TABLET(15 MG) BY MOUTH DAILY WITH SUPPER   [DISCONTINUED] atenolol (TENORMIN) 25 MG tablet TAKE 1 TABLET(25 MG) BY MOUTH DAILY     Review of Systems:   Please  see the history of present illness.    All other systems reviewed and are negative.     EKGs/Labs/Other Test Reviewed:   EKG: EKG from today demonstrates sinus bradycardia with first-degree AV block  EKG Interpretation Date/Time:    Ventricular Rate:    PR Interval:    QRS Duration:    QT Interval:    QTC Calculation:   R Axis:      Text Interpretation:           Risk Assessment/Calculations:          Physical Exam:   VS:  BP 124/68   Pulse (!) 43   Ht 5' (1.524 m)   Wt 169 lb (76.7 kg)   LMP  (LMP Unknown)   SpO2 97%   BMI 33.01 kg/m        Wt Readings from Last 3 Encounters:  03/16/23 169 lb (76.7 kg)  11/01/22 161 lb (73 kg)  10/06/22 167 lb (75.8 kg)      GENERAL:  No apparent distress, AOx3 HEENT:  No carotid bruits, +2 carotid impulses, no scleral icterus CAR: Bradycardic no  murmurs, gallops, rubs, or thrills RES:  Clear to auscultation bilaterally ABD:  Soft, nontender, nondistended, positive bowel sounds x 4 VASC:  +2 radial pulses, +2 carotid pulses NEURO:  CN 2-12 grossly intact; motor and sensory grossly intact PSYCH:  No active depression or anxiety EXT: Mild edema edema, no ecchymosis, or cyanosis  Signed, Orbie Pyo, MD  03/16/2023 3:55 PM    [facility] [address] [place], [place], [place]  [address] Phone: [phone]; Fax: [phone]   Note:  This document was prepared using Dragon voice recognition software and may include unintentional dictation errors.

## 2023-03-15 NOTE — Telephone Encounter (Signed)
 Called and spoke w patient.  She is feeling better today.  She was dizzy early this morning but not since.  She said most of the time over the last 2 weeks she feels dizzy.  When questioned- it is a light headed feeling.  Not room spinning.    Her BP yesterday was elevated 163/92, hr 40s Today: 143/73, 62  While on the phone HR is 39, EKG on her watch indicated no afib.  Denies lightheadedness at the moment.    I've added her to the DOD schedule for tomorrow with Dr. Lynnette Caffey.  Per patient I called her son in law to confirm he is able to bring her.    She continues amiodarone and is taking lasix 40 mg daily and atenolol 25 mg daily.

## 2023-03-16 ENCOUNTER — Ambulatory Visit: Attending: Internal Medicine | Admitting: Internal Medicine

## 2023-03-16 ENCOUNTER — Encounter: Payer: Self-pay | Admitting: Internal Medicine

## 2023-03-16 ENCOUNTER — Ambulatory Visit (INDEPENDENT_AMBULATORY_CARE_PROVIDER_SITE_OTHER)

## 2023-03-16 VITALS — BP 124/68 | HR 43 | Ht 60.0 in | Wt 169.0 lb

## 2023-03-16 DIAGNOSIS — R001 Bradycardia, unspecified: Secondary | ICD-10-CM | POA: Insufficient documentation

## 2023-03-16 DIAGNOSIS — N1832 Chronic kidney disease, stage 3b: Secondary | ICD-10-CM | POA: Insufficient documentation

## 2023-03-16 DIAGNOSIS — D6869 Other thrombophilia: Secondary | ICD-10-CM | POA: Insufficient documentation

## 2023-03-16 DIAGNOSIS — I700 Atherosclerosis of aorta: Secondary | ICD-10-CM | POA: Diagnosis not present

## 2023-03-16 DIAGNOSIS — R609 Edema, unspecified: Secondary | ICD-10-CM | POA: Insufficient documentation

## 2023-03-16 DIAGNOSIS — E785 Hyperlipidemia, unspecified: Secondary | ICD-10-CM | POA: Diagnosis not present

## 2023-03-16 DIAGNOSIS — I480 Paroxysmal atrial fibrillation: Secondary | ICD-10-CM | POA: Insufficient documentation

## 2023-03-16 DIAGNOSIS — Z952 Presence of prosthetic heart valve: Secondary | ICD-10-CM | POA: Diagnosis not present

## 2023-03-16 DIAGNOSIS — I2510 Atherosclerotic heart disease of native coronary artery without angina pectoris: Secondary | ICD-10-CM | POA: Diagnosis present

## 2023-03-16 NOTE — Patient Instructions (Signed)
 Medication Instructions:  Stop atenolol  *If you need a refill on your cardiac medications before your next appointment, please call your pharmacy*   Lab Work: [facility] for TSH/reflext free T4   Testing/Procedures: Zio monitor for 5 days   Follow-Up: As planned  Other Instructions ZIO XT- Long Term Monitor Instructions  Your physician has requested you wear a ZIO patch monitor for 5 days.  This is a single patch monitor. [employer] supplies one patch monitor per enrollment. Additional stickers are not available. Please do not apply patch if you will be having a Nuclear Stress Test,  Echocardiogram, Cardiac CT, MRI, or Chest Xray during the period you would be wearing the  monitor. The patch cannot be worn during these tests. You cannot remove and re-apply the  ZIO XT patch monitor.  Your ZIO patch monitor will be mailed 3 day USPS to your address on file. It may take 3-5 days  to receive your monitor after you have been enrolled.  Once you have received your monitor, please review the enclosed instructions. Your monitor  has already been registered assigning a specific monitor serial # to you.  Billing and Patient Assistance Program Information  We have supplied [employer] with any of your insurance information on file for billing purposes. [employer] offers a sliding scale Patient Assistance Program for patients that do not have  insurance, or whose insurance does not completely cover the cost of the ZIO monitor.  You must apply for the Patient Assistance Program to qualify for this discounted rate.  To apply, please call [employer] at [phone], select option 4, select option 2, ask to apply for  Patient Assistance Program. Meredeth Ide will ask your household income, and how many people  are in your household. They will quote your out-of-pocket cost based on that information.  [employer] will also be able to set up a 62-month, interest-free payment plan if needed.  Applying the monitor    Shave hair from upper left chest.  Hold abrader disc by orange tab. Rub abrader in 40 strokes over the upper left chest as  indicated in your monitor instructions.  Clean area with 4 enclosed alcohol pads. Let dry.  Apply patch as indicated in monitor instructions. Patch will be placed under collarbone on left  side of chest with arrow pointing upward.  Rub patch adhesive wings for 2 minutes. Remove white label marked "1". Remove the white  label marked "2". Rub patch adhesive wings for 2 additional minutes.  While looking in a mirror, press and release button in center of patch. A small green light will  flash 3-4 times. This will be your only indicator that the monitor has been turned on.  Do not shower for the first 24 hours. You may shower after the first 24 hours.  Press the button if you feel a symptom. You will hear a small click. Record Date, Time and  Symptom in the Patient Logbook.  When you are ready to remove the patch, follow instructions on the last 2 pages of Patient  Logbook. Stick patch monitor onto the last page of Patient Logbook.  Place Patient Logbook in the blue and white box. Use locking tab on box and tape box closed  securely. The blue and white box has prepaid postage on it. Please place it in the mailbox as  soon as possible. Your physician should have your test results approximately 7 days after the  monitor has been mailed back to [facility]  Call [employer]  Customer Care at [phone] if you have questions regarding  your ZIO XT patch monitor. Call them immediately if you see an orange light blinking on your  monitor.  If your monitor falls off in less than 4 days, contact our Monitor department at [phone].  If your monitor becomes loose or falls off after 4 days call [employer] at [phone] for  suggestions on securing your monitor

## 2023-03-16 NOTE — Progress Notes (Unsigned)
 Applied a 5 day Zio XT monitor to patient in the office

## 2023-03-17 DIAGNOSIS — I480 Paroxysmal atrial fibrillation: Secondary | ICD-10-CM | POA: Diagnosis not present

## 2023-03-17 DIAGNOSIS — R001 Bradycardia, unspecified: Secondary | ICD-10-CM | POA: Diagnosis not present

## 2023-03-18 ENCOUNTER — Encounter: Payer: Self-pay | Admitting: Internal Medicine

## 2023-03-18 LAB — TSH RFX ON ABNORMAL TO FREE T4: TSH: 3.78 u[IU]/mL (ref 0.450–4.500)

## 2023-03-25 ENCOUNTER — Other Ambulatory Visit: Payer: Self-pay

## 2023-03-25 ENCOUNTER — Encounter: Payer: Self-pay | Admitting: Cardiovascular Disease

## 2023-03-25 ENCOUNTER — Ambulatory Visit: Attending: Cardiovascular Disease | Admitting: Cardiovascular Disease

## 2023-03-25 VITALS — BP 140/70 | HR 76 | Ht 60.0 in | Wt 167.4 lb

## 2023-03-25 DIAGNOSIS — Z952 Presence of prosthetic heart valve: Secondary | ICD-10-CM

## 2023-03-25 DIAGNOSIS — I2510 Atherosclerotic heart disease of native coronary artery without angina pectoris: Secondary | ICD-10-CM | POA: Diagnosis not present

## 2023-03-25 DIAGNOSIS — I4821 Permanent atrial fibrillation: Secondary | ICD-10-CM | POA: Diagnosis not present

## 2023-03-25 DIAGNOSIS — I480 Paroxysmal atrial fibrillation: Secondary | ICD-10-CM

## 2023-03-25 DIAGNOSIS — R001 Bradycardia, unspecified: Secondary | ICD-10-CM | POA: Diagnosis not present

## 2023-03-25 DIAGNOSIS — I10 Essential (primary) hypertension: Secondary | ICD-10-CM | POA: Diagnosis not present

## 2023-03-25 DIAGNOSIS — I5033 Acute on chronic diastolic (congestive) heart failure: Secondary | ICD-10-CM | POA: Diagnosis not present

## 2023-03-25 DIAGNOSIS — N183 Chronic kidney disease, stage 3 unspecified: Secondary | ICD-10-CM | POA: Diagnosis not present

## 2023-03-25 MED ORDER — FUROSEMIDE 40 MG PO TABS: 40.0000 mg | ORAL_TABLET | Freq: Two times a day (BID) | ORAL | 3 refills | Status: DC

## 2023-03-25 NOTE — Progress Notes (Signed)
 Chief Complaint  Patient presents with   Follow-up    Bradycardia, dizziness   History of Present Illness: 89 yo female with history of CAD, HLD, SVT, paroxysmal atrial fibrillation, breast cancer, chronic diastolic CHF, bilateral carotid artery disease and severe aortic valve stenosis s/p TAVR who is here today for cardiac follow up.  Cardiac cath in July 2017 with mild plaque in the LAD. She underwent TAVR on 07/27/16 with placement of a 26 mm Edwards Sapien 3 bioprosthetic AVR from the right femoral artery. Post operative echo showed a normally functioning AVR. She had been followed in the past for her CAD and AS by Dr. Mayford Knife. She was seen in our office September 2020 by Tereso Newcomer, PA-C with volume overload that responded quickly to Lasix therapy. Echo September 2020 with LVEF=55-60%, mild LVH, normally functioning bioprosthetic AVR with mean gradient 17 mmHg (13 mmHg post immediately post implant), trace MR. Carotid artery dopplers November 2022 with mild to moderate bilateral carotid artery disease. I saw her in October 2023 and she c/o palpitations and weight gain. Echo 11/13/21 with LVEF=55-60%. AVR working well.  Cardiac monitor November 2023 with runs of SVT. She was admitted to [facility] December  2023 with atrial fib with RVR and underwent TEE guided cardioversion and was discharged on Cardizem and Eliquis but had an allergic reaction and was changed to atenolol and Xarelto. She was seen in our office 03/16/23 by Dr. Lynnette Caffey for dizziness and report of heart rate in the 40s on her home smart watch. Heart rate 43 bpm in our office. Atenolol was stopped. TSH normal. She turned in her cardiac monitor 3 days ago but it is not yet processed.    She is here today for follow up. She is feeling back to baseline since stopping the atenolol. Her dizziness stopped one week ago. She denies any chest pain, dyspnea, palpitations. She has worsened LE edema since cutting her Lasix back to 40 mg daily. Overall  feeling much better.     Primary Care Physician: Debroah Loop, DO  Past Medical History:  Diagnosis Date   Arthritis    hands, knees   CAD (coronary artery disease), native coronary artery cardiologist--- dr Clifton James   a. mild non obst CAD per cath 07-18-2015   Carotid artery disease (HCC)    Carotid US 09/2018: Bilateral ICA 40-59 // Carotid US 11/21: Bilateral ICA 40-59; repeat 1 year    Chronic diastolic CHF (congestive heart failure) (HCC)    followed by cardiology   CKD (chronic kidney disease), stage III (HCC)    followed by pcp   Dyspnea    per pt when she walks which is usual for her   History of gastric ulcer    remote yrs ago   History of rheumatic fever as a child    History of right breast cancer 2006   s/p right partial mastectomy w/ node dissection's,  completed chemo/ radiation same year  (05-21-2020 pt stated no recurrence)   Hyperlipidemia    Hypothyroidism    followed by pcp   Osteoporosis    PMB (postmenopausal bleeding)    PSVT (paroxysmal supraventricular tachycardia) (HCC) 10/2012   in ED resolved w/ vagal maneuver    S/P aortic valve replacement 07/27/2016   for severe stenosis;  last echo in epic 09-22-2018 ef 55-60%, mild TR, aortic valve area 1.4cm^2 and mean gradiant 17 mmHg   Seasonal allergies    Vitamin D deficiency     Past Surgical History:  Procedure Laterality Date   CARDIAC CATHETERIZATION N/A 07/18/2015   Procedure: Right/Left Heart Cath and Coronary Angiography;  Surgeon: Kathleene Hazel, MD;  Location: [facility] INVASIVE CV LAB;  Service: Cardiovascular;  Laterality: N/A;   CARDIOVERSION N/A 01/01/2022   Procedure: CARDIOVERSION;  Surgeon: Meriam Sprague, MD;  Location: [facility] ENDOSCOPY;  Service: Cardiovascular;  Laterality: N/A;   CARDIOVERSION N/A 09/10/2022   Procedure: CARDIOVERSION;  Surgeon: Wendall Stade, MD;  Location: [facility] INVASIVE CV LAB;  Service: Cardiovascular;  Laterality: N/A;   CATARACT EXTRACTION W/ INTRAOCULAR LENS   IMPLANT, BILATERAL     2017   CHOLECYSTECTOMY N/A 08/06/2014   Procedure: LAPAROSCOPIC CHOLECYSTECTOMY ;  Surgeon: Emelia Loron, MD;  Location: [facility] OR;  Service: General;  Laterality: N/A;   HYSTEROSCOPY WITH D & C N/A 05/26/2020   Procedure: DILATATION AND CURETTAGE /HYSTEROSCOPY;  Surgeon: Romualdo Bolk, MD;  Location: [facility];  Service: Gynecology;  Laterality: N/A;  Request case for Cate   PARTIAL MASTECTOMY WITH AXILLARY SENTINEL LYMPH NODE BIOPSY Right 2006   TEE WITHOUT CARDIOVERSION N/A 07/27/2016   Procedure: TRANSESOPHAGEAL ECHOCARDIOGRAM (TEE);  Surgeon: Kathleene Hazel, MD;  Location: [facility] OR;  Service: Open Heart Surgery;  Laterality: N/A;   TEE WITHOUT CARDIOVERSION N/A 01/01/2022   Procedure: TRANSESOPHAGEAL ECHOCARDIOGRAM (TEE);  Surgeon: Meriam Sprague, MD;  Location: [facility] ENDOSCOPY;  Service: Cardiovascular;  Laterality: N/A;   THYROID SURGERY  1969   NODULE REMOVED, benign per pt   TONSILLECTOMY  age 55   TOTAL HIP ARTHROPLASTY Left 2003   TOTAL HIP ARTHROPLASTY Right 08/28/2013   Procedure: RIGHT TOTAL HIP ARTHROPLASTY ANTERIOR APPROACH;  Surgeon: Shelda Pal, MD;  Location: [facility] ORS;  Service: Orthopedics;  Laterality: Right;   TOTAL KNEE ARTHROPLASTY Right 01-16-2007  @[facility]    TRANSCATHETER AORTIC VALVE REPLACEMENT, TRANSFEMORAL N/A 07/27/2016   Procedure: TRANSCATHETER AORTIC VALVE REPLACEMENT, TRANSFEMORAL;  Surgeon: Kathleene Hazel, MD;  Location: [facility] OR;  Service: Open Heart Surgery;  Laterality: N/A;    Current Outpatient Medications  Medication Sig Dispense Refill   acetaminophen (TYLENOL) 650 MG CR tablet Take 650 mg by mouth as needed.     amiodarone (PACERONE) 200 MG tablet Take 1 tablet (200 mg total) by mouth daily. 30 tablet 3   Calcium Carbonate-Vitamin D (CALCIUM 600/VITAMIN D PO) Take 1 tablet by mouth daily.     dextromethorphan (DELSYM) 30 MG/5ML liquid Take 30 mg by mouth at bedtime as needed for cough.      fexofenadine (QC FEXOFENADINE HYDROCHLORIDE) 180 MG tablet Take 180 mg by mouth as needed.     furosemide (LASIX) 40 MG tablet Take 1 tablet (40 mg total) by mouth 2 (two) times daily. 180 tablet 3   levothyroxine (SYNTHROID, LEVOTHROID) 112 MCG tablet Take 112 mcg by mouth daily before breakfast.     Magnesium 400 MG CAPS Take 400 mg by mouth daily.      Menthol, Topical Analgesic, (BIOFREEZE EX) Apply 1 application topically as needed (pain).      potassium chloride (KLOR-CON M) 10 MEQ tablet Taken 2 tablets by mouth every morning and take one tablet by mouth every PM 180 tablet 2   vitamin C (ASCORBIC ACID) 500 MG tablet Take 500 mg by mouth daily.     XARELTO 15 MG TABS tablet TAKE 1 TABLET(15 MG) BY MOUTH DAILY WITH SUPPER 90 tablet 3   No current facility-administered medications for this visit.    Allergies  Allergen Reactions   Atorvastatin  Other (See Comments)    Myalgias    Compazine [Prochlorperazine] Other (See Comments)    Unknown, pt does not remember   Floxin [Ofloxacin] Other (See Comments)    Unknown, pt does not remember reaction but was allergic reaction   Livalo [Pitavastatin] Other (See Comments)    Leg pain   Pravastatin Sodium Other (See Comments)    mylgias    Simvastatin Other (See Comments)    Elevated lft's 06/2011   Fenofibrate Rash    Rash     Social History   Socioeconomic History   Marital status: Widowed    Spouse name: Not on file   Number of children: Not on file   Years of education: Not on file   Highest education level: Not on file  Occupational History   Not on file  Tobacco Use   Smoking status: Never   Smokeless tobacco: Never   Tobacco comments:    Never smoked 08/24/22  Vaping Use   Vaping status: Never Used  Substance and Sexual Activity   Alcohol use: No   Drug use: Never   Sexual activity: Not Currently    Birth control/protection: Post-menopausal  Other Topics Concern   Not on file  Social History Narrative   Not on  file   Social Drivers of Health   Financial Resource Strain: Not on file  Food Insecurity: No Food Insecurity (12/30/2021)   Hunger Vital Sign    Worried About Running Out of Food in the Last Year: Never true    Ran Out of Food in the Last Year: Never true  Transportation Needs: No Transportation Needs (12/30/2021)   PRAPARE - Administrator, Civil Service (Medical): No    Lack of Transportation (Non-Medical): No  Physical Activity: Not on file  Stress: Not on file  Social Connections: Not on file  Intimate Partner Violence: Not At Risk (12/30/2021)   Humiliation, Afraid, Rape, and Kick questionnaire    Fear of Current or Ex-Partner: No    Emotionally Abused: No    Physically Abused: No    Sexually Abused: No    Family History  Problem Relation Age of Onset   Heart disease Mother    Heart disease Father     Review of Systems:  As stated in the HPI and otherwise negative.   BP (!) 140/70   Pulse 76   Ht 5' (1.524 m)   Wt 75.9 kg   LMP  (LMP Unknown)   SpO2 97%   BMI 32.69 kg/m   Physical Examination:  General: Well developed, well nourished, NAD  HEENT: OP clear, mucus membranes moist  SKIN: warm, dry. No rashes. Neuro: No focal deficits  Musculoskeletal: Muscle strength 5/5 all ext  Psychiatric: Mood and affect normal  Neck: No JVD, no carotid bruits, no thyromegaly, no lymphadenopathy.  Lungs:Clear bilaterally, no wheezes, rhonci, crackles Cardiovascular: Regular rate and rhythm. No murmurs, gallops or rubs. Abdomen:Soft. Bowel sounds present. Non-tender.  Extremities: 1-2+ bilateral lower extremity edema. Pulses are 2 + in the bilateral DP/PT.   EKG:  EKG is not ordered today. The ekg ordered today demonstrates   Recent Labs: 09/08/2022: ALT 17; Platelets 194 09/10/2022: Hemoglobin 13.6 11/09/2022: BUN 24; Creatinine, Ser 1.30; NT-Pro BNP 554; Potassium 4.1; Sodium 139 03/17/2023: TSH 3.780   Lipid Panel No results found for: "CHOL", "TRIG",  "HDL", "CHOLHDL", "VLDL", "LDLCALC", "LDLDIRECT"   Wt Readings from Last 3 Encounters:  03/25/23 75.9 kg  03/16/23 76.7 kg  11/01/22  73 kg    Assessment and Plan:   1. Severe aortic valve stenosis: She underwent TAVR with placement of a 26 mm Sapien 3 valve from the right transfemoral approach in July 2018. Her valve was working well by echo in October 2023. Continue ASA and SBE prophylaxis.   2. CAD without angina: She is known to have mild CAD by cath in 2017. No chest pain. She is statin intolerant. Continue ASA  3. Atrial fibrillation, paroxysmal: s/p DCCV December 2023. Sinus today on exam. She is now off of her beta blocker due to bradycardia. Dizziness resolved. Continue Eliquis.   4. Acute on Chronic diastolic CHF: She had been on Lasix 80 mg am and 50 mg pm but this was cut back to 40 mg daily in November 2024 due to worsened renal function. Now on lasix 40 mg daily and she is having more LE edema. Will check BMET today and have her increase her Lasix 40 mg po BID.   5. Carotid artery disease: Mild to moderate bilateral carotid artery disease by dopplers November 2022. Given advanced age, will not repeat dopplers  6. Dizziness/Bradycardia: HR is 70s today. Dizziness resolved since atenolol was stopped.   Labs/ tests ordered today include:   Orders Placed This Encounter  Procedures   Basic metabolic panel   Disposition:   F/U with me in 6 months  Signed, Verne Carrow, MD 03/25/2023 3:27 PM    [facility] [address] [place], [place], [place]  [address] Phone: [phone]; Fax: [phone]

## 2023-03-25 NOTE — Patient Instructions (Addendum)
 Medication Instructions:  Your physician has recommended you make the following change in your medication:  1.) change Lasix to 40 mg -twice a day  *If you need a refill on your cardiac medications before your next appointment, please call your pharmacy*   Lab Work: Today: bmet   Testing/Procedures: none   Follow-Up: At [facility], you and your health needs are our priority.  As part of our continuing mission to provide you with exceptional heart care, we have created designated Provider Care Teams.  These Care Teams include your primary Cardiologist (physician) and Advanced Practice Providers (APPs -  Physician Assistants and Nurse Practitioners) who all work together to provide you with the care you need, when you need it.   Your next appointment:   4-8 week(s)  Provider:   Tereso Newcomer, PA-C    [facility] Address: [address] [place], [place], [place] [address] Hours:  Closes 4:30?PM  Phone: [phone]

## 2023-03-26 LAB — BASIC METABOLIC PANEL
BUN/Creatinine Ratio: 20 (ref 12–28)
BUN: 27 mg/dL (ref 8–27)
CO2: 28 mmol/L (ref 20–29)
Calcium: 9.9 mg/dL (ref 8.7–10.3)
Chloride: 97 mmol/L (ref 96–106)
Creatinine, Ser: 1.36 mg/dL — ABNORMAL HIGH (ref 0.57–1.00)
Glucose: 84 mg/dL (ref 70–99)
Potassium: 4.9 mmol/L (ref 3.5–5.2)
Sodium: 139 mmol/L (ref 134–144)
eGFR: 37 mL/min/{1.73_m2} — ABNORMAL LOW (ref >59)

## 2023-03-29 DIAGNOSIS — R001 Bradycardia, unspecified: Secondary | ICD-10-CM | POA: Diagnosis not present

## 2023-03-30 ENCOUNTER — Encounter: Payer: Self-pay | Admitting: Internal Medicine

## 2023-04-04 DIAGNOSIS — I10 Essential (primary) hypertension: Secondary | ICD-10-CM | POA: Diagnosis not present

## 2023-04-04 DIAGNOSIS — I4821 Permanent atrial fibrillation: Secondary | ICD-10-CM | POA: Diagnosis not present

## 2023-04-04 DIAGNOSIS — E039 Hypothyroidism, unspecified: Secondary | ICD-10-CM | POA: Diagnosis not present

## 2023-04-04 DIAGNOSIS — I480 Paroxysmal atrial fibrillation: Secondary | ICD-10-CM | POA: Diagnosis not present

## 2023-04-11 ENCOUNTER — Telehealth: Payer: Self-pay | Admitting: Cardiovascular Disease

## 2023-04-11 MED ORDER — AMIODARONE HCL 200 MG PO TABS: 200.0000 mg | ORAL_TABLET | Freq: Every day | ORAL | 3 refills | Status: DC

## 2023-04-11 NOTE — Telephone Encounter (Signed)
*  STAT* If patient is at the pharmacy, call can be transferred to refill team.   1. Which medications need to be refilled? (please list name of each medication and dose if known) amiodarone (PACERONE) 200 MG tablet   Take 1 tablet (200 mg total) by mouth daily.    2. Would you like to learn more about the convenience, safety, & potential cost savings by using the [facility]? No   3. Are you open to using the [facility] No   4. Which pharmacy/location (including street and city if local pharmacy) is medication to be sent to?[id number] - [place], [place] - [address]    5. Do they need a 30 day or 90 day supply? 90 Day Supply .  Pt is currently out of medication.

## 2023-04-11 NOTE — Telephone Encounter (Signed)
 Pt's medication was sent to pt's pharmacy as requested. Confirmation received.

## 2023-04-23 DIAGNOSIS — I10 Essential (primary) hypertension: Secondary | ICD-10-CM | POA: Diagnosis not present

## 2023-04-23 DIAGNOSIS — N183 Chronic kidney disease, stage 3 unspecified: Secondary | ICD-10-CM | POA: Diagnosis not present

## 2023-04-23 DIAGNOSIS — I480 Paroxysmal atrial fibrillation: Secondary | ICD-10-CM | POA: Diagnosis not present

## 2023-04-23 DIAGNOSIS — I4821 Permanent atrial fibrillation: Secondary | ICD-10-CM | POA: Diagnosis not present

## 2023-05-04 DIAGNOSIS — I4821 Permanent atrial fibrillation: Secondary | ICD-10-CM | POA: Diagnosis not present

## 2023-05-04 DIAGNOSIS — E039 Hypothyroidism, unspecified: Secondary | ICD-10-CM | POA: Diagnosis not present

## 2023-05-04 DIAGNOSIS — I480 Paroxysmal atrial fibrillation: Secondary | ICD-10-CM | POA: Diagnosis not present

## 2023-05-04 DIAGNOSIS — N183 Chronic kidney disease, stage 3 unspecified: Secondary | ICD-10-CM | POA: Diagnosis not present

## 2023-05-04 DIAGNOSIS — I10 Essential (primary) hypertension: Secondary | ICD-10-CM | POA: Diagnosis not present

## 2023-05-23 DIAGNOSIS — I480 Paroxysmal atrial fibrillation: Secondary | ICD-10-CM | POA: Diagnosis not present

## 2023-05-23 DIAGNOSIS — N183 Chronic kidney disease, stage 3 unspecified: Secondary | ICD-10-CM | POA: Diagnosis not present

## 2023-05-23 DIAGNOSIS — I10 Essential (primary) hypertension: Secondary | ICD-10-CM | POA: Diagnosis not present

## 2023-05-23 DIAGNOSIS — I4821 Permanent atrial fibrillation: Secondary | ICD-10-CM | POA: Diagnosis not present

## 2023-05-25 NOTE — Progress Notes (Signed)
 Chief Complaint  Patient presents with   Follow-up    Chronic diastolic CHF   History of Present Illness: 89 yo female with history of CAD, HLD, SVT, paroxysmal atrial fibrillation, breast cancer, chronic diastolic CHF, bilateral carotid artery disease and severe aortic valve stenosis s/p TAVR who is here today for cardiac follow up.  Cardiac cath in July 2017 with mild plaque in the LAD. She underwent TAVR on 07/27/16 with placement of a 26 mm Edwards Sapien 3 bioprosthetic AVR from the right femoral artery. Post operative echo showed a normally functioning AVR. She had been followed in the past for her CAD and AS by Dr. Micael Adas. She was seen in our office September 2020 by Marlyse Single, PA-C with volume overload that responded quickly to Lasix  therapy. Echo September 2020 with LVEF=55-60%, mild LVH, normally functioning bioprosthetic AVR with mean gradient 17 mmHg (13 mmHg post immediately post implant), trace MR. Carotid artery dopplers November 2022 with mild to moderate bilateral carotid artery disease. I saw her in October 2023 and she c/o palpitations and weight gain. Echo 11/13/21 with LVEF=55-60%. AVR working well.  Cardiac monitor November 2023 with runs of SVT. She was admitted to [facility] December  2023 with atrial fib with RVR and underwent TEE guided cardioversion and was discharged on Cardizem  and Eliquis  but had an allergic reaction and was changed to atenolol  and Xarelto . She was seen in our office 03/16/23 by Dr. Lorie Rook for dizziness and report of heart rate in the 40s on her home smart watch. Heart rate 43 bpm in our office. Atenolol  was stopped. TSH normal. Cardiac monitor March 2025 with with rare premature beats (PACs/PVCs). No heart block noted. I saw her in the office 03/25/23 and she was feeling better but noted LE edema since cutting back on her Lasix . Her Lasix  was increaesed to 40 mg BID.   She is here today for follow up. The patient denies any chest pain, palpitations, lower  extremity edema, orthopnea, PND, dizziness, near syncope or syncope. She has ongoing dyspnea at her baseline. She fell off of the toilet this week. She did not pass out.     Primary Care Physician: Elida Grounds, DO  Past Medical History:  Diagnosis Date   Arthritis    hands, knees   CAD (coronary artery disease), native coronary artery cardiologist--- dr Abel Hoe   a. mild non obst CAD per cath 07-18-2015   Carotid artery disease (HCC)    Carotid US  09/2018: Bilateral ICA 40-59 // Carotid US  11/21: Bilateral ICA 40-59; repeat 1 year    Chronic diastolic CHF (congestive heart failure) (HCC)    followed by cardiology   CKD (chronic kidney disease), stage III (HCC)    followed by pcp   Dyspnea    per pt when she walks which is usual for her   History of gastric ulcer    remote yrs ago   History of rheumatic fever as a child    History of right breast cancer 2006   s/p right partial mastectomy w/ node dissection's,  completed chemo/ radiation same year  (05-21-2020 pt stated no recurrence)   Hyperlipidemia    Hypothyroidism    followed by pcp   Osteoporosis    PMB (postmenopausal bleeding)    PSVT (paroxysmal supraventricular tachycardia) (HCC) 10/2012   in ED resolved w/ vagal maneuver    S/P aortic valve replacement 07/27/2016   for severe stenosis;  last echo in epic 09-22-2018 ef 55-60%, mild TR, aortic  valve area 1.4cm^2 and mean gradiant 17 mmHg   Seasonal allergies    Vitamin D  deficiency     Past Surgical History:  Procedure Laterality Date   CARDIAC CATHETERIZATION N/A 07/18/2015   Procedure: Right/Left Heart Cath and Coronary Angiography;  Surgeon: Odie Benne, MD;  Location: [facility] INVASIVE CV LAB;  Service: Cardiovascular;  Laterality: N/A;   CARDIOVERSION N/A 01/01/2022   Procedure: CARDIOVERSION;  Surgeon: Sonny Dust, MD;  Location: [facility] ENDOSCOPY;  Service: Cardiovascular;  Laterality: N/A;   CARDIOVERSION N/A 09/10/2022   Procedure: CARDIOVERSION;   Surgeon: Loyde Rule, MD;  Location: [facility] INVASIVE CV LAB;  Service: Cardiovascular;  Laterality: N/A;   CATARACT EXTRACTION W/ INTRAOCULAR LENS  IMPLANT, BILATERAL     2017   CHOLECYSTECTOMY N/A 08/06/2014   Procedure: LAPAROSCOPIC CHOLECYSTECTOMY ;  Surgeon: Enid Harry, MD;  Location: [facility] OR;  Service: General;  Laterality: N/A;   HYSTEROSCOPY WITH D & C N/A 05/26/2020   Procedure: DILATATION AND CURETTAGE /HYSTEROSCOPY;  Surgeon: Wanita Gutta, MD;  Location: [facility] [place];  Service: Gynecology;  Laterality: N/A;  Request case for Cate   PARTIAL MASTECTOMY WITH AXILLARY SENTINEL LYMPH NODE BIOPSY Right 2006   TEE WITHOUT CARDIOVERSION N/A 07/27/2016   Procedure: TRANSESOPHAGEAL ECHOCARDIOGRAM (TEE);  Surgeon: Odie Benne, MD;  Location: [facility] OR;  Service: Open Heart Surgery;  Laterality: N/A;   TEE WITHOUT CARDIOVERSION N/A 01/01/2022   Procedure: TRANSESOPHAGEAL ECHOCARDIOGRAM (TEE);  Surgeon: Sonny Dust, MD;  Location: [facility] ENDOSCOPY;  Service: Cardiovascular;  Laterality: N/A;   THYROID  SURGERY  1969   NODULE REMOVED, benign per pt   TONSILLECTOMY  age 64   TOTAL HIP ARTHROPLASTY Left 2003   TOTAL HIP ARTHROPLASTY Right 08/28/2013   Procedure: RIGHT TOTAL HIP ARTHROPLASTY ANTERIOR APPROACH;  Surgeon: Bevin Bucks, MD;  Location: [facility] ORS;  Service: Orthopedics;  Laterality: Right;   TOTAL KNEE ARTHROPLASTY Right 01-16-2007  @[facility]    TRANSCATHETER AORTIC VALVE REPLACEMENT, TRANSFEMORAL N/A 07/27/2016   Procedure: TRANSCATHETER AORTIC VALVE REPLACEMENT, TRANSFEMORAL;  Surgeon: Odie Benne, MD;  Location: [facility] OR;  Service: Open Heart Surgery;  Laterality: N/A;    Current Outpatient Medications  Medication Sig Dispense Refill   acetaminophen  (TYLENOL ) 650 MG CR tablet Take 650 mg by mouth as needed.     amiodarone  (PACERONE ) 200 MG tablet Take 1 tablet (200 mg total) by mouth daily. 90 tablet 3   Calcium Carbonate-Vitamin D  (CALCIUM  600/VITAMIN D  PO) Take 1 tablet by mouth daily.     dextromethorphan  (DELSYM ) 30 MG/5ML liquid Take 30 mg by mouth at bedtime as needed for cough.     fexofenadine (QC FEXOFENADINE HYDROCHLORIDE) 180 MG tablet Take 180 mg by mouth as needed.     furosemide  (LASIX ) 40 MG tablet Take 1 tablet (40 mg total) by mouth 2 (two) times daily. 180 tablet 3   levothyroxine  (SYNTHROID , LEVOTHROID) 112 MCG tablet Take 112 mcg by mouth daily before breakfast.     Magnesium  400 MG CAPS Take 400 mg by mouth daily.      Menthol , Topical Analgesic, (BIOFREEZE EX) Apply 1 application topically as needed (pain).      potassium chloride  (KLOR-CON  M) 10 MEQ tablet Taken 2 tablets by mouth every morning and take one tablet by mouth every PM 180 tablet 2   vitamin C  (ASCORBIC ACID ) 500 MG tablet Take 500 mg by mouth daily.     XARELTO  15 MG TABS tablet TAKE 1 TABLET(15 MG) BY  MOUTH DAILY WITH SUPPER 90 tablet 3   No current facility-administered medications for this visit.    Allergies  Allergen Reactions   Atorvastatin Other (See Comments)    Myalgias    Compazine [Prochlorperazine] Other (See Comments)    Unknown, pt does not remember   Floxin [Ofloxacin] Other (See Comments)    Unknown, pt does not remember reaction but was allergic reaction   Livalo [Pitavastatin] Other (See Comments)    Leg pain   Pravastatin Sodium Other (See Comments)    mylgias    Simvastatin Other (See Comments)    Elevated lft's 06/2011   Fenofibrate Rash    Rash     Social History   Socioeconomic History   Marital status: Widowed    Spouse name: Not on file   Number of children: Not on file   Years of education: Not on file   Highest education level: Not on file  Occupational History   Not on file  Tobacco Use   Smoking status: Never   Smokeless tobacco: Never   Tobacco comments:    Never smoked 08/24/22  Vaping Use   Vaping status: Never Used  Substance and Sexual Activity   Alcohol use: No   Drug use: Never    Sexual activity: Not Currently    Birth control/protection: Post-menopausal  Other Topics Concern   Not on file  Social History Narrative   Not on file   Social Drivers of Health   Financial Resource Strain: Not on file  Food Insecurity: No Food Insecurity (12/30/2021)   Hunger Vital Sign    Worried About Running Out of Food in the Last Year: Never true    Ran Out of Food in the Last Year: Never true  Transportation Needs: No Transportation Needs (12/30/2021)   PRAPARE - Administrator, Civil Service (Medical): No    Lack of Transportation (Non-Medical): No  Physical Activity: Not on file  Stress: Not on file  Social Connections: Not on file  Intimate Partner Violence: Not At Risk (12/30/2021)   Humiliation, Afraid, Rape, and Kick questionnaire    Fear of Current or Ex-Partner: No    Emotionally Abused: No    Physically Abused: No    Sexually Abused: No    Family History  Problem Relation Age of Onset   Heart disease Mother    Heart disease Father     Review of Systems:  As stated in the HPI and otherwise negative.   BP (!) 138/90   Pulse 60   Ht 5' (1.524 m)   Wt 160 lb 6.4 oz (72.8 kg)   LMP  (LMP Unknown)   SpO2 98%   BMI 31.33 kg/m   Physical Examination: General: Well developed, well nourished, NAD  HEENT: OP clear, mucus membranes moist  SKIN: warm, dry. No rashes. Neuro: No focal deficits  Musculoskeletal: Muscle strength 5/5 all ext  Psychiatric: Mood and affect normal  Neck: No JVD, no carotid bruits, no thyromegaly, no lymphadenopathy.  Lungs:Clear bilaterally, no wheezes, rhonci, crackles Cardiovascular: Regular rate and rhythm. Systolic murmur.  Abdomen:Soft. Bowel sounds present. Non-tender.  Extremities: No lower extremity edema. Pulses are 2 + in the bilateral DP/PT.   EKG:  EKG is not ordered today. The ekg ordered today demonstrates   Recent Labs: 09/08/2022: ALT 17; Platelets 194 09/10/2022: Hemoglobin 13.6 11/09/2022: NT-Pro  BNP 554 03/17/2023: TSH 3.780 03/25/2023: BUN 27; Creatinine, Ser 1.36; Potassium 4.9; Sodium 139   Lipid Panel No results  found for: "CHOL", "TRIG", "HDL", "CHOLHDL", "VLDL", "LDLCALC", "LDLDIRECT"   Wt Readings from Last 3 Encounters:  05/26/23 160 lb 6.4 oz (72.8 kg)  03/25/23 167 lb 6.4 oz (75.9 kg)  03/16/23 169 lb (76.7 kg)    Assessment and Plan:   1. Severe aortic valve stenosis: She underwent TAVR with placement of a 26 mm Sapien 3 valve from the right transfemoral approach in July 2018. Her valve was working well by echo in October 2023. Will continue ASA. Continue SBE prophylaxis.   2. CAD without angina: She is known to have mild CAD by cath in 2017. No chest pain. No chest pain. She is statin intolerant. Continue ASA  3. Atrial fibrillation, paroxysmal: s/p DCCV December 2023. Sinus today. She is now off of her beta blocker due to bradycardia. Her dizziness resolved off of the beta blocker. Continue amiodarone  and Eliquis .  TSH normal March 2025.    4. Chronic diastolic CHF: She had been on Lasix  80 mg am and 40 mg pm but this was cut back to 40 mg daily in November 2024 due to worsened renal function. She is now taking Lasix  40 mg daily. Her wt is down 7 lbs since over the past 60 days. No LE edema.     5. Carotid artery disease: Mild to moderate bilateral carotid artery disease by dopplers November 2022. Given advanced age, will not repeat dopplers  6. Dizziness/Bradycardia: HR 60 today  Labs/ tests ordered today include:   No orders of the defined types were placed in this encounter.  Disposition:   F/U with me in 6 months  Signed, Antoinette Batman, MD 05/26/2023 9:46 AM    [facility] [address] [place], [place], [place]  [address] Phone: [phone]; Fax: [phone]

## 2023-05-26 ENCOUNTER — Ambulatory Visit: Payer: Medicare Other | Attending: Cardiovascular Disease | Admitting: Cardiovascular Disease

## 2023-05-26 ENCOUNTER — Encounter: Payer: Self-pay | Admitting: Cardiovascular Disease

## 2023-05-26 VITALS — BP 138/90 | HR 60 | Ht 60.0 in | Wt 160.4 lb

## 2023-05-26 DIAGNOSIS — Z952 Presence of prosthetic heart valve: Secondary | ICD-10-CM | POA: Insufficient documentation

## 2023-05-26 DIAGNOSIS — I5032 Chronic diastolic (congestive) heart failure: Secondary | ICD-10-CM | POA: Diagnosis not present

## 2023-05-26 DIAGNOSIS — I2510 Atherosclerotic heart disease of native coronary artery without angina pectoris: Secondary | ICD-10-CM | POA: Insufficient documentation

## 2023-05-26 DIAGNOSIS — I480 Paroxysmal atrial fibrillation: Secondary | ICD-10-CM | POA: Diagnosis not present

## 2023-05-26 DIAGNOSIS — I779 Disorder of arteries and arterioles, unspecified: Secondary | ICD-10-CM | POA: Diagnosis not present

## 2023-05-26 NOTE — Patient Instructions (Addendum)
 Medication Instructions:  No changes *If you need a refill on your cardiac medications before your next appointment, please call your pharmacy*  Lab Work:   Testing/Procedures: none  Follow-Up: At [facility], you and your health needs are our priority.  As part of our continuing mission to provide you with exceptional heart care, our providers are all part of one team.  This team includes your primary Cardiologist (physician) and Advanced Practice Providers or APPs (Physician Assistants and Nurse Practitioners) who all work together to provide you with the care you need, when you need it.  Your next appointment:   6 month(s)  Provider:   Antoinette Batman, MD    We recommend signing up for the patient portal called "MyChart".  Sign up information is provided on this After Visit Summary.  MyChart is used to connect with patients for Virtual Visits (Telemedicine).  Patients are able to view lab/test results, encounter notes, upcoming appointments, etc.  Non-urgent messages can be sent to your provider as well.   To learn more about what you can do with MyChart, go to [email or web address].   Other Instructions

## 2023-06-04 DIAGNOSIS — E039 Hypothyroidism, unspecified: Secondary | ICD-10-CM | POA: Diagnosis not present

## 2023-06-04 DIAGNOSIS — N183 Chronic kidney disease, stage 3 unspecified: Secondary | ICD-10-CM | POA: Diagnosis not present

## 2023-06-04 DIAGNOSIS — I480 Paroxysmal atrial fibrillation: Secondary | ICD-10-CM | POA: Diagnosis not present

## 2023-06-04 DIAGNOSIS — I4821 Permanent atrial fibrillation: Secondary | ICD-10-CM | POA: Diagnosis not present

## 2023-06-04 DIAGNOSIS — I10 Essential (primary) hypertension: Secondary | ICD-10-CM | POA: Diagnosis not present

## 2023-06-18 DIAGNOSIS — I480 Paroxysmal atrial fibrillation: Secondary | ICD-10-CM | POA: Diagnosis not present

## 2023-06-18 DIAGNOSIS — I4821 Permanent atrial fibrillation: Secondary | ICD-10-CM | POA: Diagnosis not present

## 2023-06-18 DIAGNOSIS — I10 Essential (primary) hypertension: Secondary | ICD-10-CM | POA: Diagnosis not present

## 2023-06-18 DIAGNOSIS — N183 Chronic kidney disease, stage 3 unspecified: Secondary | ICD-10-CM | POA: Diagnosis not present

## 2023-06-19 ENCOUNTER — Other Ambulatory Visit: Payer: Self-pay

## 2023-06-19 ENCOUNTER — Inpatient Hospital Stay (HOSPITAL_COMMUNITY)

## 2023-06-19 ENCOUNTER — Encounter (HOSPITAL_COMMUNITY): Payer: Self-pay | Admitting: Cardiology

## 2023-06-19 ENCOUNTER — Inpatient Hospital Stay (HOSPITAL_COMMUNITY)
Admission: EM | Admit: 2023-06-19 | Discharge: 2023-06-28 | DRG: 321 | Disposition: A | Discharge status: Home / Self Care | Attending: Cardiology | Admitting: Cardiology

## 2023-06-19 ENCOUNTER — Encounter (HOSPITAL_COMMUNITY)
Admission: EM | Disposition: A | Discharge status: Home / Self Care | Payer: Self-pay | Source: Home / Self Care | Attending: Internal Medicine

## 2023-06-19 DIAGNOSIS — Z8249 Family history of ischemic heart disease and other diseases of the circulatory system: Secondary | ICD-10-CM

## 2023-06-19 DIAGNOSIS — I272 Pulmonary hypertension, unspecified: Secondary | ICD-10-CM | POA: Diagnosis not present

## 2023-06-19 DIAGNOSIS — I480 Paroxysmal atrial fibrillation: Secondary | ICD-10-CM | POA: Diagnosis not present

## 2023-06-19 DIAGNOSIS — Z7401 Bed confinement status: Secondary | ICD-10-CM | POA: Diagnosis not present

## 2023-06-19 DIAGNOSIS — Z9049 Acquired absence of other specified parts of digestive tract: Secondary | ICD-10-CM

## 2023-06-19 DIAGNOSIS — Z961 Presence of intraocular lens: Secondary | ICD-10-CM | POA: Diagnosis not present

## 2023-06-19 DIAGNOSIS — Z7901 Long term (current) use of anticoagulants: Secondary | ICD-10-CM | POA: Diagnosis not present

## 2023-06-19 DIAGNOSIS — Z96651 Presence of right artificial knee joint: Secondary | ICD-10-CM | POA: Diagnosis present

## 2023-06-19 DIAGNOSIS — R609 Edema, unspecified: Secondary | ICD-10-CM | POA: Diagnosis not present

## 2023-06-19 DIAGNOSIS — Z79890 Hormone replacement therapy (postmenopausal): Secondary | ICD-10-CM | POA: Diagnosis not present

## 2023-06-19 DIAGNOSIS — E039 Hypothyroidism, unspecified: Secondary | ICD-10-CM | POA: Diagnosis present

## 2023-06-19 DIAGNOSIS — M810 Age-related osteoporosis without current pathological fracture: Secondary | ICD-10-CM | POA: Diagnosis present

## 2023-06-19 DIAGNOSIS — I472 Ventricular tachycardia, unspecified: Secondary | ICD-10-CM | POA: Diagnosis not present

## 2023-06-19 DIAGNOSIS — K219 Gastro-esophageal reflux disease without esophagitis: Secondary | ICD-10-CM | POA: Diagnosis not present

## 2023-06-19 DIAGNOSIS — E785 Hyperlipidemia, unspecified: Secondary | ICD-10-CM | POA: Diagnosis present

## 2023-06-19 DIAGNOSIS — I2510 Atherosclerotic heart disease of native coronary artery without angina pectoris: Secondary | ICD-10-CM

## 2023-06-19 DIAGNOSIS — I255 Ischemic cardiomyopathy: Secondary | ICD-10-CM | POA: Diagnosis not present

## 2023-06-19 DIAGNOSIS — I499 Cardiac arrhythmia, unspecified: Secondary | ICD-10-CM | POA: Diagnosis not present

## 2023-06-19 DIAGNOSIS — I130 Hypertensive heart and chronic kidney disease with heart failure and stage 1 through stage 4 chronic kidney disease, or unspecified chronic kidney disease: Secondary | ICD-10-CM | POA: Diagnosis present

## 2023-06-19 DIAGNOSIS — Z9011 Acquired absence of right breast and nipple: Secondary | ICD-10-CM

## 2023-06-19 DIAGNOSIS — Z96643 Presence of artificial hip joint, bilateral: Secondary | ICD-10-CM | POA: Diagnosis present

## 2023-06-19 DIAGNOSIS — Z9842 Cataract extraction status, left eye: Secondary | ICD-10-CM

## 2023-06-19 DIAGNOSIS — Z8711 Personal history of peptic ulcer disease: Secondary | ICD-10-CM

## 2023-06-19 DIAGNOSIS — I4901 Ventricular fibrillation: Secondary | ICD-10-CM | POA: Diagnosis not present

## 2023-06-19 DIAGNOSIS — I350 Nonrheumatic aortic (valve) stenosis: Secondary | ICD-10-CM | POA: Diagnosis present

## 2023-06-19 DIAGNOSIS — N170 Acute kidney failure with tubular necrosis: Secondary | ICD-10-CM | POA: Diagnosis not present

## 2023-06-19 DIAGNOSIS — N182 Chronic kidney disease, stage 2 (mild): Secondary | ICD-10-CM | POA: Diagnosis present

## 2023-06-19 DIAGNOSIS — Z952 Presence of prosthetic heart valve: Secondary | ICD-10-CM

## 2023-06-19 DIAGNOSIS — E66811 Obesity, class 1: Secondary | ICD-10-CM | POA: Diagnosis not present

## 2023-06-19 DIAGNOSIS — I5023 Acute on chronic systolic (congestive) heart failure: Secondary | ICD-10-CM | POA: Diagnosis not present

## 2023-06-19 DIAGNOSIS — I2102 ST elevation (STEMI) myocardial infarction involving left anterior descending coronary artery: Principal | ICD-10-CM

## 2023-06-19 DIAGNOSIS — Z955 Presence of coronary angioplasty implant and graft: Secondary | ICD-10-CM

## 2023-06-19 DIAGNOSIS — Z452 Encounter for adjustment and management of vascular access device: Secondary | ICD-10-CM | POA: Diagnosis not present

## 2023-06-19 DIAGNOSIS — I213 ST elevation (STEMI) myocardial infarction of unspecified site: Secondary | ICD-10-CM | POA: Diagnosis not present

## 2023-06-19 DIAGNOSIS — Z9841 Cataract extraction status, right eye: Secondary | ICD-10-CM

## 2023-06-19 DIAGNOSIS — I2109 ST elevation (STEMI) myocardial infarction involving other coronary artery of anterior wall: Secondary | ICD-10-CM | POA: Diagnosis not present

## 2023-06-19 DIAGNOSIS — Z6831 Body mass index (BMI) 31.0-31.9, adult: Secondary | ICD-10-CM

## 2023-06-19 DIAGNOSIS — N183 Chronic kidney disease, stage 3 unspecified: Secondary | ICD-10-CM | POA: Diagnosis not present

## 2023-06-19 DIAGNOSIS — I493 Ventricular premature depolarization: Secondary | ICD-10-CM | POA: Diagnosis present

## 2023-06-19 DIAGNOSIS — Z79899 Other long term (current) drug therapy: Secondary | ICD-10-CM

## 2023-06-19 DIAGNOSIS — I252 Old myocardial infarction: Secondary | ICD-10-CM

## 2023-06-19 DIAGNOSIS — I4821 Permanent atrial fibrillation: Secondary | ICD-10-CM | POA: Diagnosis not present

## 2023-06-19 DIAGNOSIS — R570 Cardiogenic shock: Secondary | ICD-10-CM | POA: Diagnosis not present

## 2023-06-19 DIAGNOSIS — R001 Bradycardia, unspecified: Secondary | ICD-10-CM | POA: Diagnosis not present

## 2023-06-19 DIAGNOSIS — I10 Essential (primary) hypertension: Secondary | ICD-10-CM | POA: Diagnosis not present

## 2023-06-19 DIAGNOSIS — Z853 Personal history of malignant neoplasm of breast: Secondary | ICD-10-CM

## 2023-06-19 DIAGNOSIS — Z8619 Personal history of other infectious and parasitic diseases: Secondary | ICD-10-CM

## 2023-06-19 DIAGNOSIS — Z888 Allergy status to other drugs, medicaments and biological substances status: Secondary | ICD-10-CM

## 2023-06-19 DIAGNOSIS — I5021 Acute systolic (congestive) heart failure: Secondary | ICD-10-CM | POA: Diagnosis not present

## 2023-06-19 DIAGNOSIS — I509 Heart failure, unspecified: Secondary | ICD-10-CM | POA: Diagnosis not present

## 2023-06-19 DIAGNOSIS — R531 Weakness: Secondary | ICD-10-CM | POA: Diagnosis not present

## 2023-06-19 DIAGNOSIS — R0989 Other specified symptoms and signs involving the circulatory and respiratory systems: Secondary | ICD-10-CM | POA: Diagnosis not present

## 2023-06-19 HISTORY — PX: RIGHT HEART CATH: CATH118263

## 2023-06-19 HISTORY — PX: CORONARY/GRAFT ACUTE MI REVASCULARIZATION: CATH118305

## 2023-06-19 HISTORY — PX: LEFT HEART CATH AND CORONARY ANGIOGRAPHY: CATH118249

## 2023-06-19 LAB — CBC WITH DIFFERENTIAL/PLATELET
Abs Immature Granulocytes: 0.03 10*3/uL (ref 0.00–0.07)
Basophils Absolute: 0 10*3/uL (ref 0.0–0.1)
Basophils Relative: 1 %
Eosinophils Absolute: 0.2 10*3/uL (ref 0.0–0.5)
Eosinophils Relative: 4 %
HCT: 38.1 % (ref 36.0–46.0)
Hemoglobin: 12.6 g/dL (ref 12.0–15.0)
Immature Granulocytes: 1 %
Lymphocytes Relative: 14 %
Lymphs Abs: 0.7 10*3/uL (ref 0.7–4.0)
MCH: 34.7 pg — ABNORMAL HIGH (ref 26.0–34.0)
MCHC: 33.1 g/dL (ref 30.0–36.0)
MCV: 105 fL — ABNORMAL HIGH (ref 80.0–100.0)
Monocytes Absolute: 0.5 10*3/uL (ref 0.1–1.0)
Monocytes Relative: 9 %
Neutro Abs: 3.6 10*3/uL (ref 1.7–7.7)
Neutrophils Relative %: 71 %
Platelets: 222 10*3/uL (ref 150–400)
RBC: 3.63 MIL/uL — ABNORMAL LOW (ref 3.87–5.11)
RDW: 13.7 % (ref 11.5–15.5)
WBC: 5 10*3/uL (ref 4.0–10.5)
nRBC: 0 % (ref 0.0–0.2)

## 2023-06-19 LAB — COOXEMETRY PANEL
Carboxyhemoglobin: 2.7 % — ABNORMAL HIGH (ref 0.5–1.5)
Methemoglobin: 0.9 % (ref 0.0–1.5)
O2 Saturation: 58.1 %
Total hemoglobin: 12.3 g/dL (ref 12.0–16.0)

## 2023-06-19 LAB — POCT I-STAT EG7
Acid-Base Excess: 1 mmol/L (ref 0.0–2.0)
Acid-base deficit: 2 mmol/L (ref 0.0–2.0)
Bicarbonate: 25.6 mmol/L (ref 20.0–28.0)
Bicarbonate: 27.5 mmol/L (ref 20.0–28.0)
Calcium, Ion: 1.06 mmol/L — ABNORMAL LOW (ref 1.15–1.40)
Calcium, Ion: 1.08 mmol/L — ABNORMAL LOW (ref 1.15–1.40)
HCT: 35 % — ABNORMAL LOW (ref 36.0–46.0)
HCT: 36 % (ref 36.0–46.0)
Hemoglobin: 11.9 g/dL — ABNORMAL LOW (ref 12.0–15.0)
Hemoglobin: 12.2 g/dL (ref 12.0–15.0)
O2 Saturation: 55 %
O2 Saturation: 59 %
Potassium: 3.7 mmol/L (ref 3.5–5.1)
Potassium: 3.9 mmol/L (ref 3.5–5.1)
Sodium: 122 mmol/L — ABNORMAL LOW (ref 135–145)
Sodium: 130 mmol/L — ABNORMAL LOW (ref 135–145)
TCO2: 27 mmol/L (ref 22–32)
TCO2: 29 mmol/L (ref 22–32)
pCO2, Ven: 52.5 mmHg (ref 44–60)
pCO2, Ven: 54.1 mmHg (ref 44–60)
pH, Ven: 7.283 (ref 7.25–7.43)
pH, Ven: 7.327 (ref 7.25–7.43)
pO2, Ven: 32 mmHg (ref 32–45)
pO2, Ven: 35 mmHg (ref 32–45)

## 2023-06-19 LAB — MRSA NEXT GEN BY PCR, NASAL: MRSA by PCR Next Gen: NOT DETECTED

## 2023-06-19 LAB — POCT I-STAT, CHEM 8
BUN: 31 mg/dL — ABNORMAL HIGH (ref 8–23)
Calcium, Ion: 1.09 mmol/L — ABNORMAL LOW (ref 1.15–1.40)
Chloride: 87 mmol/L — ABNORMAL LOW (ref 98–111)
Creatinine, Ser: 1.2 mg/dL — ABNORMAL HIGH (ref 0.44–1.00)
Glucose, Bld: 104 mg/dL — ABNORMAL HIGH (ref 70–99)
HCT: 32 % — ABNORMAL LOW (ref 36.0–46.0)
Hemoglobin: 10.9 g/dL — ABNORMAL LOW (ref 12.0–15.0)
Potassium: 3.5 mmol/L (ref 3.5–5.1)
Sodium: 126 mmol/L — ABNORMAL LOW (ref 135–145)
TCO2: 24 mmol/L (ref 22–32)

## 2023-06-19 LAB — PROTIME-INR
INR: 2 — ABNORMAL HIGH (ref 0.8–1.2)
Prothrombin Time: 22.7 s — ABNORMAL HIGH (ref 11.4–15.2)

## 2023-06-19 LAB — POCT I-STAT 7, (LYTES, BLD GAS, ICA,H+H)
Acid-Base Excess: 2 mmol/L (ref 0.0–2.0)
Bicarbonate: 27.2 mmol/L (ref 20.0–28.0)
Calcium, Ion: 1.1 mmol/L — ABNORMAL LOW (ref 1.15–1.40)
HCT: 36 % (ref 36.0–46.0)
Hemoglobin: 12.2 g/dL (ref 12.0–15.0)
O2 Saturation: 97 %
Potassium: 4 mmol/L (ref 3.5–5.1)
Sodium: 135 mmol/L (ref 135–145)
TCO2: 29 mmol/L (ref 22–32)
pCO2 arterial: 45.7 mmHg (ref 32–48)
pH, Arterial: 7.382 (ref 7.35–7.45)
pO2, Arterial: 94 mmHg (ref 83–108)

## 2023-06-19 LAB — TROPONIN I (HIGH SENSITIVITY): Troponin I (High Sensitivity): 777 ng/L (ref <18)

## 2023-06-19 LAB — COMPREHENSIVE METABOLIC PANEL WITH GFR
ALT: 12 U/L (ref 0–44)
AST: 27 U/L (ref 15–41)
Albumin: 3.4 g/dL — ABNORMAL LOW (ref 3.5–5.0)
Alkaline Phosphatase: 59 U/L (ref 38–126)
Anion gap: 14 (ref 5–15)
BUN: 31 mg/dL — ABNORMAL HIGH (ref 8–23)
CO2: 24 mmol/L (ref 22–32)
Calcium: 9 mg/dL (ref 8.9–10.3)
Chloride: 98 mmol/L (ref 98–111)
Creatinine, Ser: 1.49 mg/dL — ABNORMAL HIGH (ref 0.44–1.00)
GFR, Estimated: 33 mL/min — ABNORMAL LOW (ref >60)
Glucose, Bld: 135 mg/dL — ABNORMAL HIGH (ref 70–99)
Potassium: 4.4 mmol/L (ref 3.5–5.1)
Sodium: 136 mmol/L (ref 135–145)
Total Bilirubin: 1.2 mg/dL (ref 0.0–1.2)
Total Protein: 6.7 g/dL (ref 6.5–8.1)

## 2023-06-19 LAB — CG4 I-STAT (LACTIC ACID)
Lactic Acid, Venous: 1.5 mmol/L (ref 0.5–1.9)
Lactic Acid, Venous: 0.6 mmol/L (ref 0.5–1.9)

## 2023-06-19 LAB — APTT: aPTT: 42 s — ABNORMAL HIGH (ref 24–36)

## 2023-06-19 LAB — HEMOGLOBIN A1C
Hgb A1c MFr Bld: 5.1 % (ref 4.8–5.6)
Mean Plasma Glucose: 99.67 mg/dL

## 2023-06-19 LAB — POCT ACTIVATED CLOTTING TIME: Activated Clotting Time: 406 s

## 2023-06-19 SURGERY — CORONARY/GRAFT ACUTE MI REVASCULARIZATION
Anesthesia: LOCAL

## 2023-06-19 MED ORDER — HEPARIN SODIUM (PORCINE) 1000 UNIT/ML IJ SOLN
INTRAMUSCULAR | Status: AC
  Filled 2023-06-19: qty 10

## 2023-06-19 MED ORDER — POTASSIUM CHLORIDE CRYS ER 20 MEQ PO TBCR
40.0000 meq | EXTENDED_RELEASE_TABLET | Freq: Once | ORAL | Status: AC
  Administered 2023-06-19: 40 meq via ORAL
  Filled 2023-06-19: qty 2

## 2023-06-19 MED ORDER — HEPARIN (PORCINE) 25000 UT/250ML-% IV SOLN
950.0000 [IU]/h | INTRAVENOUS | Status: AC
  Administered 2023-06-19: 1100 [IU]/h via INTRAVENOUS
  Filled 2023-06-19: qty 250

## 2023-06-19 MED ORDER — CLOPIDOGREL BISULFATE 75 MG PO TABS
75.0000 mg | ORAL_TABLET | Freq: Every day | ORAL | Status: DC
  Administered 2023-06-20 – 2023-06-28 (×9): 75 mg via ORAL
  Filled 2023-06-19 (×9): qty 1

## 2023-06-19 MED ORDER — MILRINONE LACTATE IN DEXTROSE 20-5 MG/100ML-% IV SOLN
0.2500 ug/kg/min | INTRAVENOUS | Status: DC
  Filled 2023-06-19: qty 100

## 2023-06-19 MED ORDER — CLOPIDOGREL BISULFATE 300 MG PO TABS
ORAL_TABLET | ORAL | Status: DC | PRN
Start: 2023-06-19 — End: 2023-06-19
  Administered 2023-06-19: 600 mg via ORAL

## 2023-06-19 MED ORDER — HEPARIN SODIUM (PORCINE) 1000 UNIT/ML IJ SOLN
INTRAMUSCULAR | Status: DC | PRN
  Administered 2023-06-19: 7000 [IU] via INTRAVENOUS

## 2023-06-19 MED ORDER — CHLORHEXIDINE GLUCONATE CLOTH 2 % EX PADS
6.0000 | MEDICATED_PAD | Freq: Every day | CUTANEOUS | Status: DC
  Administered 2023-06-20 – 2023-06-23 (×4): 6 via TOPICAL

## 2023-06-19 MED ORDER — CLOPIDOGREL BISULFATE 300 MG PO TABS
ORAL_TABLET | ORAL | Status: AC
  Filled 2023-06-19: qty 2

## 2023-06-19 MED ORDER — ACETAMINOPHEN 325 MG PO TABS
650.0000 mg | ORAL_TABLET | ORAL | Status: DC | PRN
  Administered 2023-06-20 – 2023-06-21 (×3): 650 mg via ORAL
  Filled 2023-06-19 (×3): qty 2

## 2023-06-19 MED ORDER — ONDANSETRON HCL 4 MG/2ML IJ SOLN: 4.0000 mg | Freq: Four times a day (QID) | INTRAMUSCULAR | Status: DC | PRN

## 2023-06-19 MED ORDER — FUROSEMIDE 10 MG/ML IJ SOLN
80.0000 mg | Freq: Once | INTRAMUSCULAR | Status: AC
  Administered 2023-06-19: 80 mg via INTRAVENOUS
  Filled 2023-06-19: qty 8

## 2023-06-19 MED ORDER — HYDRALAZINE HCL 20 MG/ML IJ SOLN: 10.0000 mg | INTRAMUSCULAR | Status: AC | PRN

## 2023-06-19 MED ORDER — AMIODARONE HCL 200 MG PO TABS
200.0000 mg | ORAL_TABLET | Freq: Every day | ORAL | Status: DC
  Administered 2023-06-19 – 2023-06-20 (×2): 200 mg via ORAL
  Filled 2023-06-19 (×2): qty 1

## 2023-06-19 MED ORDER — NOREPINEPHRINE BITARTRATE 1 MG/ML IV SOLN
INTRAVENOUS | Status: AC | PRN
  Administered 2023-06-19: 5 ug/min via INTRAVENOUS
  Administered 2023-06-19: 4 ug/min via INTRAVENOUS

## 2023-06-19 MED ORDER — MILRINONE LACTATE IN DEXTROSE 20-5 MG/100ML-% IV SOLN
0.2500 ug/kg/min | INTRAVENOUS | Status: DC
  Administered 2023-06-19 – 2023-06-20 (×2): 0.25 ug/kg/min via INTRAVENOUS
  Filled 2023-06-19: qty 100

## 2023-06-19 MED ORDER — NOREPINEPHRINE 4 MG/250ML-% IV SOLN: 0.0000 ug/min | INTRAVENOUS | Status: DC

## 2023-06-19 MED ORDER — SODIUM CHLORIDE 0.9 % IV SOLN: 250.0000 mL | INTRAVENOUS | Status: AC | PRN

## 2023-06-19 MED ORDER — VERAPAMIL HCL 2.5 MG/ML IV SOLN
INTRAVENOUS | Status: DC | PRN
  Administered 2023-06-19: 10 mL via INTRA_ARTERIAL

## 2023-06-19 MED ORDER — SODIUM CHLORIDE 0.9% FLUSH
3.0000 mL | Freq: Two times a day (BID) | INTRAVENOUS | Status: DC
Start: 2023-06-19 — End: 2023-06-28
  Administered 2023-06-19 – 2023-06-28 (×18): 3 mL via INTRAVENOUS

## 2023-06-19 MED ORDER — FENTANYL CITRATE (PF) 100 MCG/2ML IJ SOLN
INTRAMUSCULAR | Status: AC
  Filled 2023-06-19: qty 2

## 2023-06-19 MED ORDER — LEVOTHYROXINE SODIUM 112 MCG PO TABS
112.0000 ug | ORAL_TABLET | Freq: Every day | ORAL | Status: DC
  Administered 2023-06-20 – 2023-06-28 (×9): 112 ug via ORAL
  Filled 2023-06-19 (×10): qty 1

## 2023-06-19 MED ORDER — FENTANYL CITRATE (PF) 100 MCG/2ML IJ SOLN
INTRAMUSCULAR | Status: DC | PRN
  Administered 2023-06-19: 25 ug via INTRAVENOUS

## 2023-06-19 MED ORDER — LIDOCAINE HCL (PF) 1 % IJ SOLN
INTRAMUSCULAR | Status: AC
  Filled 2023-06-19: qty 30

## 2023-06-19 MED ORDER — DIGOXIN 125 MCG PO TABS
0.0625 mg | ORAL_TABLET | Freq: Every day | ORAL | Status: DC
  Administered 2023-06-19 – 2023-06-20 (×2): 0.0625 mg via ORAL
  Filled 2023-06-19 (×3): qty 1

## 2023-06-19 MED ORDER — HEPARIN (PORCINE) IN NACL 1000-0.9 UT/500ML-% IV SOLN
INTRAVENOUS | Status: DC | PRN
  Administered 2023-06-19 (×2): 500 mL

## 2023-06-19 MED ORDER — SODIUM CHLORIDE 0.9 % IV SOLN: 250.0000 mL | INTRAVENOUS | Status: AC

## 2023-06-19 MED ORDER — SODIUM CHLORIDE 0.9% FLUSH: 3.0000 mL | INTRAVENOUS | Status: DC | PRN

## 2023-06-19 MED ORDER — MIDAZOLAM HCL 2 MG/2ML IJ SOLN
INTRAMUSCULAR | Status: AC
  Filled 2023-06-19: qty 2

## 2023-06-19 MED ORDER — NITROGLYCERIN 1 MG/10 ML FOR IR/CATH LAB
INTRA_ARTERIAL | Status: AC
  Filled 2023-06-19: qty 10

## 2023-06-19 MED ORDER — LIDOCAINE HCL (PF) 1 % IJ SOLN
INTRAMUSCULAR | Status: DC | PRN
  Administered 2023-06-19: 10 mL
  Administered 2023-06-19: 2 mL
  Administered 2023-06-19: 15 mL

## 2023-06-19 MED ORDER — ORAL CARE MOUTH RINSE: 15.0000 mL | OROMUCOSAL | Status: DC | PRN

## 2023-06-19 MED ORDER — ASPIRIN 81 MG PO TBEC
81.0000 mg | DELAYED_RELEASE_TABLET | Freq: Every day | ORAL | Status: DC
  Administered 2023-06-19 – 2023-06-21 (×3): 81 mg via ORAL
  Filled 2023-06-19 (×3): qty 1

## 2023-06-19 MED ORDER — MIDAZOLAM HCL 2 MG/2ML IJ SOLN
INTRAMUSCULAR | Status: DC | PRN
Start: 2023-06-19 — End: 2023-06-19
  Administered 2023-06-19: 1 mg via INTRAVENOUS

## 2023-06-19 MED ORDER — FUROSEMIDE 10 MG/ML IJ SOLN
80.0000 mg | Freq: Two times a day (BID) | INTRAMUSCULAR | Status: DC
  Administered 2023-06-19 – 2023-06-20 (×2): 80 mg via INTRAVENOUS
  Filled 2023-06-19 (×2): qty 8

## 2023-06-19 MED ORDER — NOREPINEPHRINE 4 MG/250ML-% IV SOLN
INTRAVENOUS | Status: AC
  Filled 2023-06-19: qty 250

## 2023-06-19 MED ORDER — NITROGLYCERIN 1 MG/10 ML FOR IR/CATH LAB
INTRA_ARTERIAL | Status: DC | PRN
  Administered 2023-06-19: 100 ug via INTRACORONARY

## 2023-06-19 MED ORDER — VERAPAMIL HCL 2.5 MG/ML IV SOLN
INTRAVENOUS | Status: AC
Start: 2023-06-19 — End: 2023-06-19
  Filled 2023-06-19: qty 2

## 2023-06-19 SURGICAL SUPPLY — 21 items
BALLOON EMERGE MR 2.0X12 (BALLOONS) IMPLANT
BALLOON EMERGE MR 3.0X12 (BALLOONS) IMPLANT
BALLOON ~~LOC~~ EMERGE MR 3.5X15 (BALLOONS) IMPLANT
CATH INFINITI AMBI 6FR TG (CATHETERS) IMPLANT
CATH INFINITI JR4 5F (CATHETERS) IMPLANT
CATH LAUNCHER 6FR EBU 3.75 (CATHETERS) IMPLANT
CATH SWAN GANZ 7F STRAIGHT (CATHETERS) IMPLANT
DEVICE RAD COMP TR BAND LRG (VASCULAR PRODUCTS) IMPLANT
GLIDESHEATH SLEND A-KIT 6F 22G (SHEATH) IMPLANT
GUIDEWIRE INQWIRE 1.5J.035X260 (WIRE) IMPLANT
KIT ENCORE 26 ADVANTAGE (KITS) IMPLANT
KIT HEMO VALVE WATCHDOG (MISCELLANEOUS) IMPLANT
PACK CARDIAC CATHETERIZATION (CUSTOM PROCEDURE TRAY) ×1 IMPLANT
SET ATX-X65L (MISCELLANEOUS) IMPLANT
SHEATH PINNACLE 6F 10CM (SHEATH) IMPLANT
SHEATH PINNACLE 7F 10CM (SHEATH) IMPLANT
SHEATH PROBE COVER 6X72 (BAG) IMPLANT
STENT SYNERGY XD 3.0X20 (Permanent Stent) IMPLANT
WIRE ASAHI PROWATER 180CM (WIRE) IMPLANT
WIRE MICRO SET SILHO 5FR 7 (SHEATH) IMPLANT
WIRE RUNTHROUGH .014X180CM (WIRE) IMPLANT

## 2023-06-19 NOTE — Progress Notes (Addendum)
 ANTICOAGULATION CONSULT NOTE  Pharmacy Consult for heparin  Indication: atrial fibrillation  Allergies  Allergen Reactions   Compazine [Prochlorperazine] Other (See Comments)    Unknown, pt does not remember   Fenofibrate Rash   Floxin [Ofloxacin] Other (See Comments)    Unknown, pt does not remember reaction but was allergic reaction   Statins Other (See Comments)    Myalgia - Atorvastatin, simvastatin, pitavastatin    Patient Measurements: Weight: 72.8 kg (160 lb 7.9 oz) Heparin  Dosing Weight: TBW  Vital Signs: Temp: 97.2 F (36.2 C) (06/15 1500) Temp Source: Core (06/15 1200) BP: 111/72 (06/15 1500) Pulse Rate: 65 (06/15 1500)  Labs: Recent Labs    06/19/23 0812 06/19/23 0848 06/19/23 0934 06/19/23 0936  HGB 12.6 10.9* 11.9*  12.2 12.2  HCT 38.1 32.0* 35.0*  36.0 36.0  PLT 222  --   --   --   APTT 42*  --   --   --   LABPROT 22.7*  --   --   --   INR 2.0*  --   --   --   CREATININE 1.49* 1.20*  --   --   TROPONINIHS 777*  --   --   --     Estimated Creatinine Clearance: 28.3 mL/min (A) (by C-G formula based on SCr of 1.2 mg/dL (H)).  Medical History: Past Medical History:  Diagnosis Date   Arthritis    hands, knees   CAD (coronary artery disease), native coronary artery cardiologist--- dr Abel Hoe   a. mild non obst CAD per cath 07-18-2015   Carotid artery disease (HCC)    Carotid US  09/2018: Bilateral ICA 40-59 // Carotid US  11/21: Bilateral ICA 40-59; repeat 1 year    Chronic diastolic CHF (congestive heart failure) (HCC)    followed by cardiology   CKD (chronic kidney disease), stage III (HCC)    followed by pcp   Dyspnea    per pt when she walks which is usual for her   History of gastric ulcer    remote yrs ago   History of rheumatic fever as a child    History of right breast cancer 2006   s/p right partial mastectomy w/ node dissection's,  completed chemo/ radiation same year  (05-21-2020 pt stated no recurrence)   Hyperlipidemia     Hypothyroidism    followed by pcp   Osteoporosis    PMB (postmenopausal bleeding)    PSVT (paroxysmal supraventricular tachycardia) (HCC) 10/2012   in ED resolved w/ vagal maneuver    S/P aortic valve replacement 07/27/2016   for severe stenosis;  last echo in epic 09-22-2018 ef 55-60%, mild TR, aortic valve area 1.4cm^2 and mean gradiant 17 mmHg   Seasonal allergies    Vitamin D  deficiency     Medications:  See MAR  Assessment: 89 yo female presents as code STEMI s/p LAD stent.  Hx AF on Xarelto  (last dose 6/14 PM).  Pharmacy consulted for heparin  dosing.  TR band removed ~1500 - heparin  to restart @ 1700.   Goal of Therapy:  Heparin  level 0.3-0.7 units/ml aPTT 66-102 seconds Monitor platelets by anticoagulation protocol: Yes   Plan:  START heparin  1100 units/hr at 1700 8 hour anti-Xa level, aPTT Daily CBC, anti-Xa level, aPTT until correlating Monitor for s/sx of bleeding  Cecillia Cogan, PharmD Clinical Pharmacist 06/19/2023  3:14 PM

## 2023-06-19 NOTE — Plan of Care (Signed)
  Problem: Elimination: Goal: Will not experience complications related to bowel motility Outcome: Progressing   Problem: Pain Managment: Goal: General experience of comfort will improve and/or be controlled Outcome: Progressing   Problem: Education: Goal: Understanding of CV disease, CV risk reduction, and recovery process will improve Outcome: Progressing

## 2023-06-19 NOTE — ED Triage Notes (Signed)
 Pt BIB [facility] from home as a code STEMI. Pt started having CP yesterday.hx afib. A&O X4.hypotensive in the 70s, 500ml fluids given by EMS. 324 ASA given.

## 2023-06-19 NOTE — Progress Notes (Addendum)
 Cardiology Admission History and Physical   Patient ID: [name] MRN: [id number]; DOB: [birth year]   Admission date: 06/19/2023  PCP:  Elida Grounds, DO   [facility] Providers Cardiologist:  Antoinette Batman, MD   {     Chief Complaint: Chest pain  Patient Profile:   [name] is a 89 y.o. female with hyperlipidemia, SVT, PAF, s/p advert SAPIEN 3 bioprosthetic TAVR 2018, mild nonobstructive CAD previously, who is being seen 06/19/2023 for the evaluation of chest pain.  History of Present Illness:   [name] 89 year old female with  hyperlipidemia, SVT, PAF, s/p advert SAPIEN 3 bioprosthetic TAVR 2018, mild nonobstructive CAD previously, now admitted with chest pain and lateral ST elevation.  Pain started this morning around 4 AM.  She describes her chest pain as heaviness, still ongoing.  Initial blood pressure was 78/40 mmHg, now 119/50 mmHg after 500 cc of fluid.  Chest heaviness persists, associated with mild shortness of breath.   Past Medical History:  Diagnosis Date   Arthritis    hands, knees   CAD (coronary artery disease), native coronary artery cardiologist--- dr Abel Hoe   a. mild non obst CAD per cath 07-18-2015   Carotid artery disease (HCC)    Carotid US  09/2018: Bilateral ICA 40-59 // Carotid US  11/21: Bilateral ICA 40-59; repeat 1 year    Chronic diastolic CHF (congestive heart failure) (HCC)    followed by cardiology   CKD (chronic kidney disease), stage III (HCC)    followed by pcp   Dyspnea    per pt when she walks which is usual for her   History of gastric ulcer    remote yrs ago   History of rheumatic fever as a child    History of right breast cancer 2006   s/p right partial mastectomy w/ node dissection's,  completed chemo/ radiation same year  (05-21-2020 pt stated no recurrence)   Hyperlipidemia    Hypothyroidism    followed by pcp   Osteoporosis    PMB (postmenopausal bleeding)    PSVT (paroxysmal  supraventricular tachycardia) (HCC) 10/2012   in ED resolved w/ vagal maneuver    S/P aortic valve replacement 07/27/2016   for severe stenosis;  last echo in epic 09-22-2018 ef 55-60%, mild TR, aortic valve area 1.4cm^2 and mean gradiant 17 mmHg   Seasonal allergies    Vitamin D  deficiency     Past Surgical History:  Procedure Laterality Date   CARDIAC CATHETERIZATION N/A 07/18/2015   Procedure: Right/Left Heart Cath and Coronary Angiography;  Surgeon: Odie Benne, MD;  Location: [facility] INVASIVE CV LAB;  Service: Cardiovascular;  Laterality: N/A;   CARDIOVERSION N/A 01/01/2022   Procedure: CARDIOVERSION;  Surgeon: Sonny Dust, MD;  Location: [facility] ENDOSCOPY;  Service: Cardiovascular;  Laterality: N/A;   CARDIOVERSION N/A 09/10/2022   Procedure: CARDIOVERSION;  Surgeon: Loyde Rule, MD;  Location: [facility] INVASIVE CV LAB;  Service: Cardiovascular;  Laterality: N/A;   CATARACT EXTRACTION W/ INTRAOCULAR LENS  IMPLANT, BILATERAL     2017   CHOLECYSTECTOMY N/A 08/06/2014   Procedure: LAPAROSCOPIC CHOLECYSTECTOMY ;  Surgeon: Enid Harry, MD;  Location: [facility] OR;  Service: General;  Laterality: N/A;   HYSTEROSCOPY WITH D & C N/A 05/26/2020   Procedure: DILATATION AND CURETTAGE /HYSTEROSCOPY;  Surgeon: Wanita Gutta, MD;  Location: [facility] [place];  Service: Gynecology;  Laterality: N/A;  Request case for Cate   PARTIAL MASTECTOMY WITH AXILLARY SENTINEL LYMPH NODE BIOPSY Right 2006  TEE WITHOUT CARDIOVERSION N/A 07/27/2016   Procedure: TRANSESOPHAGEAL ECHOCARDIOGRAM (TEE);  Surgeon: Odie Benne, MD;  Location: [facility] OR;  Service: Open Heart Surgery;  Laterality: N/A;   TEE WITHOUT CARDIOVERSION N/A 01/01/2022   Procedure: TRANSESOPHAGEAL ECHOCARDIOGRAM (TEE);  Surgeon: Sonny Dust, MD;  Location: [facility] ENDOSCOPY;  Service: Cardiovascular;  Laterality: N/A;   THYROID  SURGERY  1969   NODULE REMOVED, benign per pt   TONSILLECTOMY  age 57   TOTAL HIP  ARTHROPLASTY Left 2003   TOTAL HIP ARTHROPLASTY Right 08/28/2013   Procedure: RIGHT TOTAL HIP ARTHROPLASTY ANTERIOR APPROACH;  Surgeon: Bevin Bucks, MD;  Location: [facility] ORS;  Service: Orthopedics;  Laterality: Right;   TOTAL KNEE ARTHROPLASTY Right 01-16-2007  @[facility]    TRANSCATHETER AORTIC VALVE REPLACEMENT, TRANSFEMORAL N/A 07/27/2016   Procedure: TRANSCATHETER AORTIC VALVE REPLACEMENT, TRANSFEMORAL;  Surgeon: Odie Benne, MD;  Location: [facility] OR;  Service: Open Heart Surgery;  Laterality: N/A;     Medications Prior to Admission: Prior to Admission medications   Medication Sig Start Date End Date Taking? Authorizing Provider  acetaminophen  (TYLENOL ) 650 MG CR tablet Take 650 mg by mouth as needed.    [provider]  amiodarone  (PACERONE ) 200 MG tablet Take 1 tablet (200 mg total) by mouth daily. 04/11/23   Odie Benne, MD  Calcium Carbonate-Vitamin D  (CALCIUM 600/VITAMIN D  PO) Take 1 tablet by mouth daily.    [provider]  dextromethorphan  (DELSYM ) 30 MG/5ML liquid Take 30 mg by mouth at bedtime as needed for cough.    [provider]  fexofenadine (QC FEXOFENADINE HYDROCHLORIDE) 180 MG tablet Take 180 mg by mouth as needed.    [provider]  furosemide  (LASIX ) 40 MG tablet Take 1 tablet (40 mg total) by mouth 2 (two) times daily. 03/25/23   Odie Benne, MD  levothyroxine  (SYNTHROID , LEVOTHROID) 112 MCG tablet Take 112 mcg by mouth daily before breakfast.    [provider]  Magnesium  400 MG CAPS Take 400 mg by mouth daily.     [provider]  Menthol , Topical Analgesic, (BIOFREEZE EX) Apply 1 application topically as needed (pain).     [provider]  potassium chloride  (KLOR-CON  M) 10 MEQ tablet Taken 2 tablets by mouth every morning and take one tablet by mouth every PM 09/13/22   Odie Benne, MD  vitamin C  (ASCORBIC ACID ) 500 MG tablet Take 500 mg by mouth daily.    [provider]  XARELTO  15 MG TABS tablet TAKE 1 TABLET(15 MG) BY MOUTH DAILY WITH SUPPER 12/06/22   Conte, Tessa N, PA-C     Allergies:    Allergies  Allergen Reactions   Atorvastatin Other (See Comments)    Myalgias    Compazine [Prochlorperazine] Other (See Comments)    Unknown, pt does not remember   Floxin [Ofloxacin] Other (See Comments)    Unknown, pt does not remember reaction but was allergic reaction   Livalo [Pitavastatin] Other (See Comments)    Leg pain   Pravastatin Sodium Other (See Comments)    mylgias    Simvastatin Other (See Comments)    Elevated lft's 06/2011   Fenofibrate Rash    Rash     Social History:   Social History   Socioeconomic History   Marital status: Widowed    Spouse name: Not on file   Number of children: Not on file   Years of education: Not on file   Highest education level: Not on file  Occupational History   Not on file  Tobacco Use   Smoking status: Never   Smokeless tobacco: Never   Tobacco comments:    Never smoked 08/24/22  Vaping Use   Vaping status: Never Used  Substance and Sexual Activity   Alcohol use: No   Drug use: Never   Sexual activity: Not Currently    Birth control/protection: Post-menopausal  Other Topics Concern   Not on file  Social History Narrative   Not on file   Social Drivers of Health   Financial Resource Strain: Not on file  Food Insecurity: No Food Insecurity (12/30/2021)   Hunger Vital Sign    Worried About Running Out of Food in the Last Year: Never true    Ran Out of Food in the Last Year: Never true  Transportation Needs: No Transportation Needs (12/30/2021)   PRAPARE - Administrator, Civil Service (Medical): No    Lack of Transportation (Non-Medical): No  Physical Activity: Not on file  Stress: Not on file  Social Connections: Not on file  Intimate Partner Violence: Not At Risk (12/30/2021)   Humiliation, Afraid, Rape, and Kick questionnaire    Fear of Current or  Ex-Partner: No    Emotionally Abused: No    Physically Abused: No    Sexually Abused: No    Family History:   The patient's family history includes Heart disease in her father and mother.    ROS:  Please see the history of present illness.  All other ROS reviewed and negative.     Physical Exam/Data:   Vitals:   06/19/23 0815 06/19/23 0818 06/19/23 0822 06/19/23 0828  BP: 102/63     Pulse: 67     Resp: 16     Temp:  (!) 96 F (35.6 C)    TempSrc:  Temporal    SpO2: 97%   94%  Weight:   72.8 kg    No intake or output data in the 24 hours ending 06/19/23 1010    06/19/2023    8:22 AM 05/26/2023    8:54 AM 03/25/2023    2:30 PM  Last 3 Weights  Weight (lbs) 160 lb 7.9 oz 160 lb 6.4 oz 167 lb 6.4 oz  Weight (kg) 72.8 kg 72.757 kg 75.932 kg     Body mass index is 31.34 kg/m.   Physical Exam Vitals and nursing note reviewed.  Constitutional:      General: She is not in acute distress. Neck:     Vascular: No JVD.   Cardiovascular:     Rate and Rhythm: Normal rate and regular rhythm.     Heart sounds: Normal heart sounds. No murmur heard. Pulmonary:     Effort: Pulmonary effort is normal.     Breath sounds: Normal breath sounds. No wheezing or rales.   Musculoskeletal:     Right lower leg: No edema.     Left lower leg: No edema.      EKG:  The ECG that was done on 06/19/2023 at 7:43 AM was personally reviewed and demonstrates sinus rhythm, anterolateral ST elevation  Relevant CV Studies: Echocardiogram 11/2021:  1. Left ventricular ejection fraction, by estimation, is 55 to 60%. The  left ventricle has normal function. The left ventricle has no regional  wall motion abnormalities. Left ventricular diastolic parameters are  consistent with Grade II diastolic  dysfunction (pseudonormalization).   2. Right ventricular systolic function is normal. The right ventricular  size is normal. Tricuspid regurgitation  signal is inadequate for assessing  PA pressure.   3.  Left atrial size was moderately dilated.   4. The mitral valve is degenerative. No evidence of mitral valve  regurgitation.   5. S/p 26 mm Sapien Aortic Prosthesis. Date 07/27/2016. No PVL. AV max 2.1  m/s. DI 0.44. Mean gradient 10 mmHg (improved from prior study 09/22/2018)  Normal Prosthesis. Aortic valve regurgitation is not visualized.   6. The inferior vena cava is normal in size with greater than 50%  respiratory variability, suggesting right atrial pressure of 3 mmHg.   Comparison(s): EF 55%, mild LVH, TAVR mean 17, peak 29.6 mmHg.   TAVR 2018: Transcatheter Aortic Valve Replacement - Percutaneous Right Transfemoral Approach             Edwards Sapien 3 THV (size 26 mm, model # 9600TFX, serial # [id number])  Coronary angiography 2017: 1. Mild non-obstructive CAD 2. Moderate aortic stenosis (peak to peak gradient 20 mmHg, AVA 1.1 cm2)    Laboratory Data:  High Sensitivity Troponin:   Recent Labs  Lab 06/19/23 0812  TROPONINIHS 777*      Chemistry Recent Labs  Lab 06/19/23 0812 06/19/23 0848 06/19/23 0934 06/19/23 0936  NA 136 126* 130*  135 122*  K 4.4 3.5 3.9  4.0 3.7  CL 98 87*  --   --   CO2 24  --   --   --   GLUCOSE 135* 104*  --   --   BUN 31* 31*  --   --   CREATININE 1.49* 1.20*  --   --   CALCIUM 9.0  --   --   --   GFRNONAA 33*  --   --   --   ANIONGAP 14  --   --   --     Recent Labs  Lab 06/19/23 0812  PROT 6.7  ALBUMIN  3.4*  AST 27  ALT 12  ALKPHOS 59  BILITOT 1.2   Lipids No results for input(s): CHOL, TRIG, HDL, LABVLDL, LDLCALC, CHOLHDL in the last 168 hours. Hematology Recent Labs  Lab 06/19/23 0812 06/19/23 0848 06/19/23 0934 06/19/23 0936  WBC 5.0  --   --   --   RBC 3.63*  --   --   --   HGB 12.6   < > 11.9*  12.2 12.2  HCT 38.1   < > 35.0*  36.0 36.0  MCV 105.0*  --   --   --   MCH 34.7*  --   --   --   MCHC 33.1  --   --   --   RDW 13.7  --   --   --   PLT 222  --   --   --    < > = values in this  interval not displayed.   Thyroid  No results for input(s): TSH, FREET4 in the last 168 hours. BNPNo results for input(s): BNP, PROBNP in the last 168 hours.  DDimer No results for input(s): DDIMER in the last 168 hours.   Radiology/Studies:  No results found.   Assessment and Plan:   89 year old female with  hyperlipidemia, SVT, PAF, s/p advert SAPIEN 3 bioprosthetic TAVR 2018, mild nonobstructive CAD previously, now admitted with chest pain and lateral ST elevation.  Chest pain: Lateral ST elevation, ongoing STEMI. While she has had TAVR, she is on Xarelto  for A-fib, took a dose this morning.  Therefore, I will choose radial access.  Patient had transient hypotension  en route, blood pressure improved to 98/70, and then 122/50 on 5 to cc IV fluids.  A-fib: On Xarelto  at home, will switch to heparin  during inpatient stay.  Further recommendations after coronary angiography.   Risk Assessment/Risk Scores:    TIMI Risk Score for ST  Elevation MI:   The patient's TIMI risk score is  , which indicates a  % risk of all cause mortality at 30 days.   New [place]  Heart Association (NYHA) Functional Class NYHA Class III  CHA2DS2-VASc Score = 4   This indicates a 4.8% annual risk of stroke. The patient's score is based upon: CHF History: 1 HTN History: 0 Diabetes History: 0 Stroke History: 0 Vascular Disease History: 0 Age Score: 2 Gender Score: 1   Code Status: Full Code  Severity of Illness: The appropriate patient status for this patient is INPATIENT. Inpatient status is judged to be reasonable and necessary in order to provide the required intensity of service to ensure the patient's safety. The patient's presenting symptoms, physical exam findings, and initial radiographic and laboratory data in the context of their chronic comorbidities is felt to place them at high risk for further clinical deterioration. Furthermore, it is not anticipated that the patient will be  medically stable for discharge from the hospital within 2 midnights of admission.   * I certify that at the point of admission it is my clinical judgment that the patient will require inpatient hospital care spanning beyond 2 midnights from the point of admission due to high intensity of service, high risk for further deterioration and high frequency of surveillance required.*   For questions or updates, please contact [facility] Please consult [email or web address] for contact info under     Signed, Cody Das, MD 06/19/2023 10:10 AM

## 2023-06-19 NOTE — H&P (Signed)
 Cardiology Admission History and Physical   Patient ID: [name] MRN: [id number]; DOB: Sep 19, 1931   Admission date: 06/19/2023  PCP:  Elida Grounds, DO   [facility] Providers Cardiologist:  Antoinette Batman, MD        Chief Complaint: Chest pain  Patient Profile:   [name] is a 89 y.o. female with 89 y.o. female with hyperlipidemia, SVT, PAF, s/p advert SAPIEN 3 bioprosthetic TAVR 2018, mild nonobstructive CAD previously, who is being seen 06/19/2023 for the evaluation of chest pain.    History of Present Illness:   [name] is a 89 year old female with  hyperlipidemia, SVT, PAF, s/p advert SAPIEN 3 bioprosthetic TAVR 2018, mild nonobstructive CAD previously, now admitted with chest pain and lateral ST elevation.   Pain started this morning around 4 AM.  She describes her chest pain as heaviness, still ongoing.  Initial blood pressure was 78/40 mmHg, now 119/50 mmHg after 500 cc of fluid.  Chest heaviness persists, associated with mild shortness of breath.     Past Medical History:  Diagnosis Date   Arthritis    hands, knees   CAD (coronary artery disease), native coronary artery cardiologist--- dr Abel Hoe   a. mild non obst CAD per cath 07-18-2015   Carotid artery disease (HCC)    Carotid US  09/2018: Bilateral ICA 40-59 // Carotid US  11/21: Bilateral ICA 40-59; repeat 1 year    Chronic diastolic CHF (congestive heart failure) (HCC)    followed by cardiology   CKD (chronic kidney disease), stage III (HCC)    followed by pcp   Dyspnea    per pt when she walks which is usual for her   History of gastric ulcer    remote yrs ago   History of rheumatic fever as a child    History of right breast cancer 2006   s/p right partial mastectomy w/ node dissection's,  completed chemo/ radiation same year  (05-21-2020 pt stated no recurrence)   Hyperlipidemia    Hypothyroidism    followed by pcp   Osteoporosis    PMB (postmenopausal bleeding)     PSVT (paroxysmal supraventricular tachycardia) (HCC) 10/2012   in ED resolved w/ vagal maneuver    S/P aortic valve replacement 07/27/2016   for severe stenosis;  last echo in epic 09-22-2018 ef 55-60%, mild TR, aortic valve area 1.4cm^2 and mean gradiant 17 mmHg   Seasonal allergies    Vitamin D  deficiency     Past Surgical History:  Procedure Laterality Date   CARDIAC CATHETERIZATION N/A 07/18/2015   Procedure: Right/Left Heart Cath and Coronary Angiography;  Surgeon: Odie Benne, MD;  Location: [facility] INVASIVE CV LAB;  Service: Cardiovascular;  Laterality: N/A;   CARDIOVERSION N/A 01/01/2022   Procedure: CARDIOVERSION;  Surgeon: Sonny Dust, MD;  Location: [facility] ENDOSCOPY;  Service: Cardiovascular;  Laterality: N/A;   CARDIOVERSION N/A 09/10/2022   Procedure: CARDIOVERSION;  Surgeon: Loyde Rule, MD;  Location: [facility] INVASIVE CV LAB;  Service: Cardiovascular;  Laterality: N/A;   CATARACT EXTRACTION W/ INTRAOCULAR LENS  IMPLANT, BILATERAL     2017   CHOLECYSTECTOMY N/A 08/06/2014   Procedure: LAPAROSCOPIC CHOLECYSTECTOMY ;  Surgeon: Enid Harry, MD;  Location: [facility] OR;  Service: General;  Laterality: N/A;   HYSTEROSCOPY WITH D & C N/A 05/26/2020   Procedure: DILATATION AND CURETTAGE /HYSTEROSCOPY;  Surgeon: Wanita Gutta, MD;  Location: [facility];  Service: Gynecology;  Laterality: N/A;  Request case for [facility]  PARTIAL MASTECTOMY WITH AXILLARY SENTINEL LYMPH NODE BIOPSY Right 2006   TEE WITHOUT CARDIOVERSION N/A 07/27/2016   Procedure: TRANSESOPHAGEAL ECHOCARDIOGRAM (TEE);  Surgeon: Odie Benne, MD;  Location: [facility] OR;  Service: Open Heart Surgery;  Laterality: N/A;   TEE WITHOUT CARDIOVERSION N/A 01/01/2022   Procedure: TRANSESOPHAGEAL ECHOCARDIOGRAM (TEE);  Surgeon: Sonny Dust, MD;  Location: [facility] ENDOSCOPY;  Service: Cardiovascular;  Laterality: N/A;   THYROID  SURGERY  1969   NODULE REMOVED, benign per pt   TONSILLECTOMY  age 64    TOTAL HIP ARTHROPLASTY Left 2003   TOTAL HIP ARTHROPLASTY Right 08/28/2013   Procedure: RIGHT TOTAL HIP ARTHROPLASTY ANTERIOR APPROACH;  Surgeon: Bevin Bucks, MD;  Location: [facility] ORS;  Service: Orthopedics;  Laterality: Right;   TOTAL KNEE ARTHROPLASTY Right 01-16-2007  @[facility]    TRANSCATHETER AORTIC VALVE REPLACEMENT, TRANSFEMORAL N/A 07/27/2016   Procedure: TRANSCATHETER AORTIC VALVE REPLACEMENT, TRANSFEMORAL;  Surgeon: Odie Benne, MD;  Location: [facility] OR;  Service: Open Heart Surgery;  Laterality: N/A;     Medications Prior to Admission: Prior to Admission medications   Medication Sig Start Date End Date Taking? Authorizing Provider  acetaminophen  (TYLENOL ) 650 MG CR tablet Take 650 mg by mouth as needed.    [provider]  amiodarone  (PACERONE ) 200 MG tablet Take 1 tablet (200 mg total) by mouth daily. 04/11/23   Odie Benne, MD  Calcium Carbonate-Vitamin D  (CALCIUM 600/VITAMIN D  PO) Take 1 tablet by mouth daily.    [provider]  dextromethorphan  (DELSYM ) 30 MG/5ML liquid Take 30 mg by mouth at bedtime as needed for cough.    [provider]  fexofenadine (QC FEXOFENADINE HYDROCHLORIDE) 180 MG tablet Take 180 mg by mouth as needed.    [provider]  furosemide  (LASIX ) 40 MG tablet Take 1 tablet (40 mg total) by mouth 2 (two) times daily. 03/25/23   Odie Benne, MD  levothyroxine  (SYNTHROID , LEVOTHROID) 112 MCG tablet Take 112 mcg by mouth daily before breakfast.    [provider]  Magnesium  400 MG CAPS Take 400 mg by mouth daily.     [provider]  Menthol , Topical Analgesic, (BIOFREEZE EX) Apply 1 application topically as needed (pain).     [provider]  potassium chloride  (KLOR-CON  M) 10 MEQ tablet Taken 2 tablets by mouth every morning and take one tablet by mouth every PM 09/13/22   Odie Benne, MD  vitamin C  (ASCORBIC ACID ) 500 MG tablet Take 500 mg by mouth daily.     [provider]  XARELTO  15 MG TABS tablet TAKE 1 TABLET(15 MG) BY MOUTH DAILY WITH SUPPER 12/06/22   Conte, Tessa N, PA-C     Allergies:    Allergies  Allergen Reactions   Atorvastatin Other (See Comments)    Myalgias    Compazine [Prochlorperazine] Other (See Comments)    Unknown, pt does not remember   Floxin [Ofloxacin] Other (See Comments)    Unknown, pt does not remember reaction but was allergic reaction   Livalo [Pitavastatin] Other (See Comments)    Leg pain   Pravastatin Sodium Other (See Comments)    mylgias    Simvastatin Other (See Comments)    Elevated lft's 06/2011   Fenofibrate Rash    Rash     Social History:   Social History   Socioeconomic History   Marital status: Widowed    Spouse name: Not on file   Number of children: Not on file   Years of  education: Not on file   Highest education level: Not on file  Occupational History   Not on file  Tobacco Use   Smoking status: Never   Smokeless tobacco: Never   Tobacco comments:    Never smoked 08/24/22  Vaping Use   Vaping status: Never Used  Substance and Sexual Activity   Alcohol use: No   Drug use: Never   Sexual activity: Not Currently    Birth control/protection: Post-menopausal  Other Topics Concern   Not on file  Social History Narrative   Not on file   Social Drivers of Health   Financial Resource Strain: Not on file  Food Insecurity: No Food Insecurity (12/30/2021)   Hunger Vital Sign    Worried About Running Out of Food in the Last Year: Never true    Ran Out of Food in the Last Year: Never true  Transportation Needs: No Transportation Needs (12/30/2021)   PRAPARE - Administrator, Civil Service (Medical): No    Lack of Transportation (Non-Medical): No  Physical Activity: Not on file  Stress: Not on file  Social Connections: Not on file  Intimate Partner Violence: Not At Risk (12/30/2021)   Humiliation, Afraid, George, and Kick questionnaire    Fear of Current  or Ex-Partner: No    Emotionally Abused: No    Physically Abused: No    Sexually Abused: No    Family History:   The patient's family history includes Heart disease in her father and mother.    ROS:  Please see the history of present illness.  All other ROS reviewed and negative.     Physical Exam/Data:   Vitals:   06/19/23 0815 06/19/23 0818 06/19/23 0822 06/19/23 0828  BP: 102/63     Pulse: 67     Resp: 16     Temp:  (!) 96 F (35.6 C)    TempSrc:  Temporal    SpO2: 97%   94%  Weight:   72.8 kg    No intake or output data in the 24 hours ending 06/19/23 1011    06/19/2023    8:22 AM 05/26/2023    8:54 AM 03/25/2023    2:30 PM  Last 3 Weights  Weight (lbs) 160 lb 7.9 oz 160 lb 6.4 oz 167 lb 6.4 oz  Weight (kg) 72.8 kg 72.757 kg 75.932 kg     Body mass index is 31.34 kg/m.   Physical Exam Vitals and nursing note reviewed.  Constitutional:      General: She is not in acute distress. Neck:     Vascular: No JVD.   Cardiovascular:     Rate and Rhythm: Normal rate and regular rhythm.     Heart sounds: Normal heart sounds. No murmur heard. Pulmonary:     Effort: Pulmonary effort is normal.     Breath sounds: Normal breath sounds. No wheezing or rales.   Musculoskeletal:     Right lower leg: No edema.     Left lower leg: No edema.      EKG:  The ECG that was done The ECG that was done on 06/19/2023 at 7:43 AM was personally reviewed and demonstrates sinus rhythm, anterolateral ST elevation   Relevant CV Studies: Echocardiogram 11/2021:  1. Left ventricular ejection fraction, by estimation, is 55 to 60%. The  left ventricle has normal function. The left ventricle has no regional  wall motion abnormalities. Left ventricular diastolic parameters are  consistent with Grade II diastolic  dysfunction (  pseudonormalization).   2. Right ventricular systolic function is normal. The right ventricular  size is normal. Tricuspid regurgitation signal is inadequate for  assessing  PA pressure.   3. Left atrial size was moderately dilated.   4. The mitral valve is degenerative. No evidence of mitral valve  regurgitation.   5. S/p 26 mm Sapien Aortic Prosthesis. Date 07/27/2016. No PVL. AV max 2.1  m/s. DI 0.44. Mean gradient 10 mmHg (improved from prior study 09/22/2018)  Normal Prosthesis. Aortic valve regurgitation is not visualized.   6. The inferior vena cava is normal in size with greater than 50%  respiratory variability, suggesting right atrial pressure of 3 mmHg.   Comparison(s): EF 55%, mild LVH, TAVR mean 17, peak 29.6 mmHg.    TAVR 2018: Transcatheter Aortic Valve Replacement - Percutaneous Right Transfemoral Approach             Edwards Sapien 3 THV (size 26 mm, model # 9600TFX, serial # [id number])   Coronary angiography 2017: 1. Mild non-obstructive CAD 2. Moderate aortic stenosis (peak to peak gradient 20 mmHg, AVA 1.1 cm2)    Laboratory Data:  High Sensitivity Troponin:   Recent Labs  Lab 06/19/23 0812  TROPONINIHS 777*      Chemistry Recent Labs  Lab 06/19/23 0812 06/19/23 0848 06/19/23 0934 06/19/23 0936  NA 136 126* 130*  135 122*  K 4.4 3.5 3.9  4.0 3.7  CL 98 87*  --   --   CO2 24  --   --   --   GLUCOSE 135* 104*  --   --   BUN 31* 31*  --   --   CREATININE 1.49* 1.20*  --   --   CALCIUM 9.0  --   --   --   GFRNONAA 33*  --   --   --   ANIONGAP 14  --   --   --     Recent Labs  Lab 06/19/23 0812  PROT 6.7  ALBUMIN  3.4*  AST 27  ALT 12  ALKPHOS 59  BILITOT 1.2   Lipids No results for input(s): CHOL, TRIG, HDL, LABVLDL, LDLCALC, CHOLHDL in the last 168 hours. Hematology Recent Labs  Lab 06/19/23 0812 06/19/23 0848 06/19/23 0934 06/19/23 0936  WBC 5.0  --   --   --   RBC 3.63*  --   --   --   HGB 12.6   < > 11.9*  12.2 12.2  HCT 38.1   < > 35.0*  36.0 36.0  MCV 105.0*  --   --   --   MCH 34.7*  --   --   --   MCHC 33.1  --   --   --   RDW 13.7  --   --   --   PLT 222  --   --   --     < > = values in this interval not displayed.   Thyroid  No results for input(s): TSH, FREET4 in the last 168 hours. BNPNo results for input(s): BNP, PROBNP in the last 168 hours.  DDimer No results for input(s): DDIMER in the last 168 hours.   Radiology/Studies:  No results found.   Assessment and Plan:   89 year old female with hyperlipidemia, SVT, PAF, s/p advert SAPIEN 3 bioprosthetic TAVR 2018, mild nonobstructive CAD previously, now admitted with chest pain and lateral ST elevation.   Chest pain: Lateral ST elevation, ongoing STEMI. While she has had TAVR, she is  on Xarelto  for A-fib, took a dose this morning.  Therefore, I will choose radial access.   Patient had transient hypotension en route, blood pressure improved to 98/70, and then 122/50 on 5 to cc IV fluids.  Given anterolateral STEMI in at least transient hypotension, patient is at high risk of developing cardiogenic shock.   A-fib: On Xarelto  at home, will switch to heparin  during inpatient stay.   Further recommendations after coronary angiography.     Risk Assessment/Risk Scores:    TIMI Risk Score for ST  Elevation MI:   The patient's TIMI risk score is  , which indicates a  % risk of all cause mortality at 30 days.   New York  Heart Association (NYHA) Functional Class NYHA Class III  CHA2DS2-VASc Score = 4   This indicates a 4.8% annual risk of stroke. The patient's score is based upon: CHF History: 1 HTN History: 0 Diabetes History: 0 Stroke History: 0 Vascular Disease History: 0 Age Score: 2 Gender Score: 1   Code Status: Full Code  Severity of Illness: The appropriate patient status for this patient is INPATIENT. Inpatient status is judged to be reasonable and necessary in order to provide the required intensity of service to ensure the patient's safety. The patient's presenting symptoms, physical exam findings, and initial radiographic and laboratory data in the context of their  chronic comorbidities is felt to place them at high risk for further clinical deterioration. Furthermore, it is not anticipated that the patient will be medically stable for discharge from the hospital within 2 midnights of admission.   * I certify that at the point of admission it is my clinical judgment that the patient will require inpatient hospital care spanning beyond 2 midnights from the point of admission due to high intensity of service, high risk for further deterioration and high frequency of surveillance required.*   For questions or updates, please contact [facility] Please consult [email or web address] for contact info under     CRITICAL CARE Performed by: Fransico Ivy   Total critical care time: 35 minutes   Critical care time was exclusive of separately billable procedures and treating other patients.   Critical care was necessary to treat or prevent imminent or life-threatening deterioration.   Anterolateral STEMI, at high risk of developing cardiogenic shock-SCAI shock classification at least a Critical care was time spent personally by me on the following activities: development of treatment plan with patient and/or surrogate as well as nursing, discussions with consultants, evaluation of patient's response to treatment, examination of patient, obtaining history from patient or surrogate, ordering and performing treatments and interventions, ordering and review of laboratory studies, ordering and review of radiographic studies, pulse oximetry and re-evaluation of patient's condition.     Signed, Cody Das, MD 06/19/2023 10:11 AM

## 2023-06-19 NOTE — ED Provider Notes (Signed)
 [place] EMERGENCY DEPARTMENT AT [facility] Provider Note   CSN: [id number] Arrival date & time: 06/19/23  7829     Patient presents with: Code STEMI   [name] is a 89 y.o. female.   HPI      89 year old female with a history of coronary artery disease, chronic diastolic CHF, paroxysmal supraventricular tachycardia, atrial fibrillation on Xarelto , hyperlipidemia, history of breast cancer, history of aortic valve replacement for aortic stenosis, for thyroidism, CKD, who presents with concern for chest pain as a code STEMI.  Reports yesterday she was having some pain in her left side, however this morning began to have a more severe pressure in the middle of her chest.  Reports it was middle and left-sided chest pressure with pressure also in her back.  Had some mild associated shortness of breath.  Denies any nausea, vomiting, abdominal pain, numbness, weakness, leg pain, cough or fever.  While her pressure has improved somewhat, she still continues to have it on arrival to the emergency department.  Denies other illness.  When EMS picked her up they found her to be hypotensive to the 70s and they gave her 500 cc of fluid and 324 of aspirin .  Her blood pressure was then improved.  They called code STEMI with concern for lateral elevation.  Past Medical History:  Diagnosis Date   Arthritis    hands, knees   CAD (coronary artery disease), native coronary artery cardiologist--- dr Abel Hoe   a. mild non obst CAD per cath 07-18-2015   Carotid artery disease (HCC)    Carotid US  09/2018: Bilateral ICA 40-59 // Carotid US  11/21: Bilateral ICA 40-59; repeat 1 year    Chronic diastolic CHF (congestive heart failure) (HCC)    followed by cardiology   CKD (chronic kidney disease), stage III (HCC)    followed by pcp   Dyspnea    per pt when she walks which is usual for her   History of gastric ulcer    remote yrs ago   History of rheumatic fever as a child    History  of right breast cancer 2006   s/p right partial mastectomy w/ node dissection's,  completed chemo/ radiation same year  (05-21-2020 pt stated no recurrence)   Hyperlipidemia    Hypothyroidism    followed by pcp   Osteoporosis    PMB (postmenopausal bleeding)    PSVT (paroxysmal supraventricular tachycardia) (HCC) 10/2012   in ED resolved w/ vagal maneuver    S/P aortic valve replacement 07/27/2016   for severe stenosis;  last echo in epic 09-22-2018 ef 55-60%, mild TR, aortic valve area 1.4cm^2 and mean gradiant 17 mmHg   Seasonal allergies    Vitamin D  deficiency      Prior to Admission medications   Medication Sig Start Date End Date Taking? Authorizing Provider  acetaminophen  (TYLENOL ) 650 MG CR tablet Take 650 mg by mouth as needed.    [provider]  amiodarone  (PACERONE ) 200 MG tablet Take 1 tablet (200 mg total) by mouth daily. 04/11/23   Odie Benne, MD  Calcium Carbonate-Vitamin D  (CALCIUM 600/VITAMIN D  PO) Take 1 tablet by mouth daily.    [provider]  dextromethorphan  (DELSYM ) 30 MG/5ML liquid Take 30 mg by mouth at bedtime as needed for cough.    [provider]  fexofenadine (QC FEXOFENADINE HYDROCHLORIDE) 180 MG tablet Take 180 mg by mouth as needed.    [provider]  furosemide  (LASIX ) 40 MG tablet  Take 1 tablet (40 mg total) by mouth 2 (two) times daily. 03/25/23   Odie Benne, MD  levothyroxine  (SYNTHROID , LEVOTHROID) 112 MCG tablet Take 112 mcg by mouth daily before breakfast.    [provider]  Magnesium  400 MG CAPS Take 400 mg by mouth daily.     [provider]  Menthol , Topical Analgesic, (BIOFREEZE EX) Apply 1 application topically as needed (pain).     [provider]  potassium chloride  (KLOR-CON  M) 10 MEQ tablet Taken 2 tablets by mouth every morning and take one tablet by mouth every PM 09/13/22   Odie Benne, MD  vitamin C  (ASCORBIC ACID ) 500 MG tablet Take 500  mg by mouth daily.    [provider]  XARELTO  15 MG TABS tablet TAKE 1 TABLET(15 MG) BY MOUTH DAILY WITH SUPPER 12/06/22   Conte, Tessa N, PA-C    Allergies: Atorvastatin, Compazine [prochlorperazine], Floxin [ofloxacin], Livalo [pitavastatin], Pravastatin sodium, Simvastatin, and Fenofibrate    Review of Systems  Updated Vital Signs BP 102/63   Pulse 67   Temp (!) 96 F (35.6 C) (Temporal)   Resp 16   LMP  (LMP Unknown)   SpO2 97%   Physical Exam Vitals and nursing note reviewed.  Constitutional:      General: She is not in acute distress.    Appearance: She is well-developed. She is not diaphoretic.  HENT:     Head: Normocephalic and atraumatic.   Eyes:     Conjunctiva/sclera: Conjunctivae normal.    Cardiovascular:     Rate and Rhythm: Normal rate and regular rhythm.     Heart sounds: Normal heart sounds. No murmur heard.    No friction rub. No gallop.  Pulmonary:     Effort: Pulmonary effort is normal. No respiratory distress.     Breath sounds: Normal breath sounds. No wheezing or rales.  Abdominal:     General: There is no distension.     Palpations: Abdomen is soft.     Tenderness: There is no abdominal tenderness. There is no guarding.   Musculoskeletal:        General: No tenderness.     Cervical back: Normal range of motion.   Skin:    General: Skin is warm and dry.     Findings: No erythema or rash.   Neurological:     Mental Status: She is alert and oriented to person, place, and time.     (all labs ordered are listed, but only abnormal results are displayed) Labs Reviewed  HEMOGLOBIN A1C  CBC WITH DIFFERENTIAL/PLATELET  PROTIME-INR  APTT  COMPREHENSIVE METABOLIC PANEL WITH GFR  LIPID PANEL  I-STAT CG4 LACTIC ACID, ED  TROPONIN I (HIGH SENSITIVITY)    EKG: None  Radiology: No results found.   Procedures   Medications Ordered in the ED - No data to display                                   89 year old female with a  history of coronary artery disease, chronic diastolic CHF, paroxysmal supraventricular tachycardia, atrial fibrillation on Xarelto , hyperlipidemia, history of breast cancer, history of aortic valve replacement/TAVR for aortic stenosis, for thyroidism, CKD, who presents with concern for chest pain as a code STEMI.  Differential diagnosis for chest pain includes pulmonary embolus, dissection, pneumothorax, pneumonia, ACS, myocarditis, pericarditis.  Initial EKG performed with EMS showed elevation in I, aVL, wider  rhythm however concordant STE concerning for STEMI. Repeat ECG somewhat improved with EMS however mild continuing and ECG in ED also with artifact however concern for mild elevation in aVL.  Normal bilateral upper and lower extremity pulses, no abdominal pain or other extremity pain, numbness or weakness and have low clinical suspicion for aortic dissection at this time.  Do not suspect PE, pneumonia, nor pneumothorax.  Agree with plan for emergent transfer to the Cath Lab with concern for acute ST elevation MI.  Dr. Filiberto Hug has taken her to the Cath Lab for further care.         Final diagnoses:  None    ED Discharge Orders     None          Scarlette Currier, MD 06/19/23 [phone]

## 2023-06-19 NOTE — Progress Notes (Signed)
   06/19/23 3664  Spiritual Encounters  Type of Visit Initial  Care provided to: Patient  Conversation partners present during encounter Nurse  Reason for visit Code  OnCall Visit Yes   Chaplain follow up with Pt dx with Stemi, Medical staff were in the process of preparing the Pt for transfer to unit 2H. Pt stated that her daughter was expected to arrive within 10-15 minutes.  Chaplain notified the on-call chaplain to follow up, as shift was nearing end.

## 2023-06-19 NOTE — Consult Note (Signed)
 Advanced Heart Failure Team Consult Note   Primary Physician: Elida Grounds, DO Cardiologist:  Antoinette Batman, MD  Reason for Consultation: Anterior STEMI/ischemic CM  HPI:    [name] is seen today for evaluation of anterior STEMI and severe ischemic CM at the request of Dr. Filiberto Hug.   [name] is a 89 y.o. female with 89 y.o. female with hyperlipidemia, SVT, PAF, severe AS s/p advert SAPIEN 3 bioprosthetic TAVR 2018, mild nonobstructive CAD previously.  Developed CP at 4am this morning. Called EMS. ECG c/w anterolateral STEMI. Initial BP 78/40 -> 119/50 after IVF. Taken emergently  to cath lab by Dr. Filiberto Hug.   Cath with 99% pLAD with minimal flow distally. Lcx/RCA ok EF 25%  Treated with 3.0 X 20 mm Synergy drug-eluting stent   RHC RA 10 PA 74/25 (45) PCW 25 Fick 3.3/1.9 TD 2.9/1.7 on NE 7   LVEDP 15   This afternoon having a cough. Denies CP.   Claudie Cull #s done personally. CVP 12 PAP 72/30 (45) Unable to wedge TD 2.4/1.3 SVR 1966     Home Medications Prior to Admission medications   Medication Sig Start Date End Date Taking? Authorizing Provider  amiodarone  (PACERONE ) 200 MG tablet Take 1 tablet (200 mg total) by mouth daily. 04/11/23  Yes Odie Benne, MD  furosemide  (LASIX ) 40 MG tablet Take 1 tablet (40 mg total) by mouth 2 (two) times daily. Patient taking differently: Take 40 mg by mouth daily. 03/25/23  Yes Odie Benne, MD  levothyroxine  (SYNTHROID , LEVOTHROID) 112 MCG tablet Take 112 mcg by mouth daily before breakfast.   Yes [provider]  Magnesium  400 MG CAPS Take 400 mg by mouth in the morning and at bedtime.   Yes [provider]  potassium chloride  (KLOR-CON  M) 10 MEQ tablet Taken 2 tablets by mouth every morning and take one tablet by mouth every PM Patient taking differently: Take 10 mEq by mouth daily. 09/13/22  Yes Odie Benne, MD  vitamin C  (ASCORBIC ACID ) 500 MG tablet Take 500 mg  by mouth daily.   Yes [provider]  XARELTO  15 MG TABS tablet TAKE 1 TABLET(15 MG) BY MOUTH DAILY WITH SUPPER Patient taking differently: Take 15 mg by mouth daily with supper. 12/06/22  Yes Conte, Tessa N, PA-C  Calcium Carbonate-Vitamin D  (CALCIUM 600/VITAMIN D  PO) Take 1 tablet by mouth daily. Patient not taking: Reported on 06/19/2023    [provider]    Past Medical History: Past Medical History:  Diagnosis Date   Arthritis    hands, knees   CAD (coronary artery disease), native coronary artery cardiologist--- dr Abel Hoe   a. mild non obst CAD per cath 07-18-2015   Carotid artery disease (HCC)    Carotid US  09/2018: Bilateral ICA 40-59 // Carotid US  11/21: Bilateral ICA 40-59; repeat 1 year    Chronic diastolic CHF (congestive heart failure) (HCC)    followed by cardiology   CKD (chronic kidney disease), stage III (HCC)    followed by pcp   Dyspnea    per pt when she walks which is usual for her   History of gastric ulcer    remote yrs ago   History of rheumatic fever as a child    History of right breast cancer 2006   s/p right partial mastectomy w/ node dissection's,  completed chemo/ radiation same year  (05-21-2020 pt stated no recurrence)   Hyperlipidemia    Hypothyroidism    followed by  pcp   Osteoporosis    PMB (postmenopausal bleeding)    PSVT (paroxysmal supraventricular tachycardia) (HCC) 10/2012   in ED resolved w/ vagal maneuver    S/P aortic valve replacement 07/27/2016   for severe stenosis;  last echo in epic 09-22-2018 ef 55-60%, mild TR, aortic valve area 1.4cm^2 and mean gradiant 17 mmHg   Seasonal allergies    Vitamin D  deficiency     Past Surgical History: Past Surgical History:  Procedure Laterality Date   CARDIAC CATHETERIZATION N/A 07/18/2015   Procedure: Right/Left Heart Cath and Coronary Angiography;  Surgeon: Odie Benne, MD;  Location: [facility] INVASIVE CV LAB;  Service: Cardiovascular;  Laterality: N/A;    CARDIOVERSION N/A 01/01/2022   Procedure: CARDIOVERSION;  Surgeon: Sonny Dust, MD;  Location[facility] ENDOSCOPY;  Service: Cardiovascular;  Laterality: N/A;   CARDIOVERSION N/A 09/10/2022   Procedure: CARDIOVERSION;  Surgeon: Loyde Rule, MD;  Location: [facility] INVASIVE CV LAB;  Service: Cardiovascular;  Laterality: N/A;   CATARACT EXTRACTION W/ INTRAOCULAR LENS  IMPLANT, BILATERAL     2017   CHOLECYSTECTOMY N/A 08/06/2014   Procedure: LAPAROSCOPIC CHOLECYSTECTOMY ;  Surgeon: Enid Harry, MD;  Location: [facility] OR;  Service: General;  Laterality: N/A;   HYSTEROSCOPY WITH D & C N/A 05/26/2020   Procedure: DILATATION AND CURETTAGE /HYSTEROSCOPY;  Surgeon: Wanita Gutta, MD;  Location: [facility] ;  Service: Gynecology;  Laterality: N/A;  Request case for Cate   PARTIAL MASTECTOMY WITH AXILLARY SENTINEL LYMPH NODE BIOPSY Right 2006   TEE WITHOUT CARDIOVERSION N/A 07/27/2016   Procedure: TRANSESOPHAGEAL ECHOCARDIOGRAM (TEE);  Surgeon: Odie Benne, MD;  Location: [facility] OR;  Service: Open Heart Surgery;  Laterality: N/A;   TEE WITHOUT CARDIOVERSION N/A 01/01/2022   Procedure: TRANSESOPHAGEAL ECHOCARDIOGRAM (TEE);  Surgeon: Sonny Dust, MD;  Location: [facility] ENDOSCOPY;  Service: Cardiovascular;  Laterality: N/A;   THYROID  SURGERY  1969   NODULE REMOVED, benign per pt   TONSILLECTOMY  age 53   TOTAL HIP ARTHROPLASTY Left 2003   TOTAL HIP ARTHROPLASTY Right 08/28/2013   Procedure: RIGHT TOTAL HIP ARTHROPLASTY ANTERIOR APPROACH;  Surgeon: Bevin Bucks, MD;  Location: [facility] ORS;  Service: Orthopedics;  Laterality: Right;   TOTAL KNEE ARTHROPLASTY Right 01-16-2007  @[facility]    TRANSCATHETER AORTIC VALVE REPLACEMENT, TRANSFEMORAL N/A 07/27/2016   Procedure: TRANSCATHETER AORTIC VALVE REPLACEMENT, TRANSFEMORAL;  Surgeon: Odie Benne, MD;  Location: [facility] OR;  Service: Open Heart Surgery;  Laterality: N/A;    Family History: Family History  Problem Relation Age of Onset    Heart disease Mother    Heart disease Father     Social History: Social History   Socioeconomic History   Marital status: Widowed    Spouse name: Not on file   Number of children: Not on file   Years of education: Not on file   Highest education level: Not on file  Occupational History   Not on file  Tobacco Use   Smoking status: Never   Smokeless tobacco: Never   Tobacco comments:    Never smoked 08/24/22  Vaping Use   Vaping status: Never Used  Substance and Sexual Activity   Alcohol use: No   Drug use: Never   Sexual activity: Not Currently    Birth control/protection: Post-menopausal  Other Topics Concern   Not on file  Social History Narrative   Not on file   Social Drivers of Health   Financial Resource Strain: Not on file  Food Insecurity: No  Food Insecurity (12/30/2021)   Hunger Vital Sign    Worried About Running Out of Food in the Last Year: Never true    Ran Out of Food in the Last Year: Never true  Transportation Needs: No Transportation Needs (12/30/2021)   PRAPARE - Administrator, Civil Service (Medical): No    Lack of Transportation (Non-Medical): No  Physical Activity: Not on file  Stress: Not on file  Social Connections: Not on file    Allergies:  Allergies  Allergen Reactions   Compazine [Prochlorperazine] Other (See Comments)    Unknown, pt does not remember   Fenofibrate Rash   Floxin [Ofloxacin] Other (See Comments)    Unknown, pt does not remember reaction but was allergic reaction   Statins Other (See Comments)    Myalgia - Atorvastatin, simvastatin, pitavastatin    Objective:    Vital Signs:   Temp:  [95.4 F (35.2 C)-97.2 F (36.2 C)] 97.2 F (36.2 C) (06/15 1500) Pulse Rate:  [0-76] 65 (06/15 1500) Resp:  [13-22] 14 (06/15 1500) BP: (74-127)/(48-75) 111/72 (06/15 1500) SpO2:  [89 %-99 %] 95 % (06/15 1500) Weight:  [72.8 kg] 72.8 kg (06/15 0822) Last BM Date : 06/19/23  Weight change: Filed Weights    06/19/23 0822  Weight: 72.8 kg    Intake/Output:   Intake/Output Summary (Last 24 hours) at 06/19/2023 1531 Last data filed at 06/19/2023 1500 Gross per 24 hour  Intake 106.5 ml  Output --  Net 106.5 ml      Physical Exam    General:  Elderly. No resp difficulty HEENT: normal Neck: supple. RIJ swan  Cor: PMI nondisplaced. Regular rate & rhythm. No rubs, gallops or murmurs. Lungs: clear Abdomen: soft, nontender, nondistended. No hepatosplenomegaly. No bruits or masses. Good bowel sounds. Extremities: no cyanosis, clubbing, rash, edema Neuro: alert & orientedx3, cranial nerves grossly intact. moves all 4 extremities w/o difficulty. Affect pleasant   Telemetry   Sinus 60-70s Personally reviewed  EKG    Sinus 69 with PACs RBBB anterolateral ST elevation Personally reviewed   Labs   Basic Metabolic Panel: Recent Labs  Lab 06/19/23 0812 06/19/23 0848 06/19/23 0934 06/19/23 0936  NA 136 126* 130*  135 122*  K 4.4 3.5 3.9  4.0 3.7  CL 98 87*  --   --   CO2 24  --   --   --   GLUCOSE 135* 104*  --   --   BUN 31* 31*  --   --   CREATININE 1.49* 1.20*  --   --   CALCIUM 9.0  --   --   --     Liver Function Tests: Recent Labs  Lab 06/19/23 0812  AST 27  ALT 12  ALKPHOS 59  BILITOT 1.2  PROT 6.7  ALBUMIN  3.4*   No results for input(s): LIPASE, AMYLASE in the last 168 hours. No results for input(s): AMMONIA in the last 168 hours.  CBC: Recent Labs  Lab 06/19/23 0812 06/19/23 0848 06/19/23 0934 06/19/23 0936  WBC 5.0  --   --   --   NEUTROABS 3.6  --   --   --   HGB 12.6 10.9* 11.9*  12.2 12.2  HCT 38.1 32.0* 35.0*  36.0 36.0  MCV 105.0*  --   --   --   PLT 222  --   --   --     Cardiac Enzymes: No results for input(s): CKTOTAL, CKMB, CKMBINDEX, TROPONINI in the  last 168 hours.  BNP: BNP (last 3 results) No results for input(s): BNP in the last 8760 hours.  ProBNP (last 3 results) Recent Labs    11/01/22 0845  11/09/22 0857  PROBNP 348 554     CBG: No results for input(s): GLUCAP in the last 168 hours.  Coagulation Studies: Recent Labs    06/19/23 0812  LABPROT 22.7*  INR 2.0*     Imaging   CARDIAC CATHETERIZATION Result Date: 06/19/2023 Images from the original result were not included. Coronary angiography and intervention 06/19/2023: LM: No significant disease LAD: Mid vessel 99% stenosis with minimal flow distally, followed by mild mid 30% disease.         Large diagonal 1, ostial 90% stenosis Lcx: No significant disease RCA: Dominant vessel, no significant disease LVEDP 15 mmHg Successful percutaneous coronary intervention mid LAD        PTCA and stent placement 3.0 X 20 mm Synergy drug-eluting stent        Postdilatation using 3.5 x 15 mm Sherman balloon up to 16 atm Right heart catheterization 06/19/2023: RA: 10 mmHg RV: 77/10 mmHg PA: 74/25 mmHg, mPAP 45 mmHg PCW: 25 mmHg AO sats: 97% PA sats: 57% Fick: CO: 3.25 L/min CI: 1.94 L/min/m2 TD: CO: 2.93 L/min CI: 1.75 L/min/m2 Patient is in cardiogenic shock due to anterior STEMI, with LVEF 15% with severe global hypokinesis, and anterior akinesis.  Lactic acid was 0.6.  She is currently on 7 mics of norepinephrine .  Given her age, recent use of Xarelto , I would prefer managing her medically as much as possible to avoid large-bore access and risk of complications for hemodynamic support device.  Discussed with Dr. Julane Ny who agrees. Cody Das, MD     Medications:     Current Medications:  amiodarone   200 mg Oral Daily   aspirin  EC  81 mg Oral Daily   [START ON 06/20/2023] clopidogrel   75 mg Oral Daily   [START ON 06/20/2023] levothyroxine   112 mcg Oral Q0600   nitroGLYCERIN        potassium chloride   40 mEq Oral Once   sodium chloride  flush  3 mL Intravenous Q12H    Infusions:  sodium chloride  10 mL/hr at 06/19/23 1500   sodium chloride      heparin      norepinephrine  (LEVOPHED ) Adult infusion Stopped (06/19/23 1139)       Assessment/Plan   1. CAD with acute anterior STEMI - Cath 06/19/23: 99% pLAD with minimal flow distally. Lcx/RCA ok EF 25%  Treated with 3.0 X 20 mm Synergy drug-eluting stent  - DAPT + statin  2. Cardiogenic shock in setting of #1 - Ischemic CM: EF ~25% on cath (EF previously 55-60% in 11/23) - RHC RA 10 PA 74/25 (45) PCW 25 Fick 3.3/1.9 TD 2.9/1.7 on NE 7 in cath lab   - Currently CVP 12 PAP 72/30 (45) Unable to wedge TD 2.4/1.3 SVR 1966 PVR 7.0 - Start milrinone, low-dose dig. IV lasix   - Echo pending  3. Pulmonary HTN - mixed picture (both pre and post-capillary) - at time of TAVR in 2018 PA 42/6 (21) PCWP 11 - should improved with treatment of HF  4. H/o AS s/p TAVR 2018 - echo pending   5. PAF - holding Xarelto  - in NSR. Continue home amio - on heparin  - At d/c would use DAPT + Eliquis . Drop ASA after 30 days   CRITICAL CARE Performed by: Jules Oar  Total critical care time: 55 minutes  Critical  care time was exclusive of separately billable procedures and treating other patients.  Critical care was necessary to treat or prevent imminent or life-threatening deterioration.  Critical care was time spent personally by me (independent of midlevel providers or residents) on the following activities: development of treatment plan with patient and/or surrogate as well as nursing, discussions with consultants, evaluation of patient's response to treatment, examination of patient, obtaining history from patient or surrogate, ordering and performing treatments and interventions, ordering and review of laboratory studies, ordering and review of radiographic studies, pulse oximetry and re-evaluation of patient's condition.  Length of Stay: 0  Jules Oar, MD  06/19/2023, 3:31 PM    Advanced Heart Failure Team Pager [phone] (M-F; 7a - 5p)  Please contact [facility] for night-coverage after hours (4p -7a ) and weekends on [email or web address]

## 2023-06-20 ENCOUNTER — Other Ambulatory Visit (HOSPITAL_COMMUNITY): Payer: Self-pay

## 2023-06-20 ENCOUNTER — Other Ambulatory Visit: Payer: Self-pay

## 2023-06-20 ENCOUNTER — Telehealth (HOSPITAL_COMMUNITY): Payer: Self-pay | Admitting: Pharmacy Technician

## 2023-06-20 ENCOUNTER — Encounter (HOSPITAL_COMMUNITY): Payer: Self-pay | Admitting: Internal Medicine

## 2023-06-20 ENCOUNTER — Inpatient Hospital Stay (HOSPITAL_COMMUNITY)

## 2023-06-20 DIAGNOSIS — I509 Heart failure, unspecified: Secondary | ICD-10-CM

## 2023-06-20 DIAGNOSIS — I2102 ST elevation (STEMI) myocardial infarction involving left anterior descending coronary artery: Secondary | ICD-10-CM

## 2023-06-20 LAB — ECHOCARDIOGRAM COMPLETE
AR max vel: 0.85 cm2
AV Area VTI: 0.88 cm2
AV Area mean vel: 0.89 cm2
AV Mean grad: 11 mmHg
AV Peak grad: 18.1 mmHg
Ao pk vel: 2.13 m/s
Area-P 1/2: 4.89 cm2
Calc EF: 55.1 %
S' Lateral: 3.3 cm
Single Plane A2C EF: 55.5 %
Single Plane A4C EF: 58.2 %
Weight: 2567.92 [oz_av]

## 2023-06-20 LAB — MAGNESIUM
Magnesium: 2.4 mg/dL (ref 1.7–2.4)
Magnesium: 2.1 mg/dL (ref 1.7–2.4)

## 2023-06-20 LAB — CBC
HCT: 35.8 % — ABNORMAL LOW (ref 36.0–46.0)
Hemoglobin: 11.7 g/dL — ABNORMAL LOW (ref 12.0–15.0)
MCH: 34.4 pg — ABNORMAL HIGH (ref 26.0–34.0)
MCHC: 32.7 g/dL (ref 30.0–36.0)
MCV: 105.3 fL — ABNORMAL HIGH (ref 80.0–100.0)
Platelets: 204 10*3/uL (ref 150–400)
RBC: 3.4 MIL/uL — ABNORMAL LOW (ref 3.87–5.11)
RDW: 13.8 % (ref 11.5–15.5)
WBC: 5.6 10*3/uL (ref 4.0–10.5)
nRBC: 0 % (ref 0.0–0.2)

## 2023-06-20 LAB — LIPID PANEL
Cholesterol: 171 mg/dL (ref 0–200)
HDL: 52 mg/dL (ref >40)
LDL Cholesterol: 108 mg/dL — ABNORMAL HIGH (ref 0–99)
Total CHOL/HDL Ratio: 3.3 ratio
Triglycerides: 54 mg/dL (ref <150)
VLDL: 11 mg/dL (ref 0–40)

## 2023-06-20 LAB — BASIC METABOLIC PANEL WITH GFR
Anion gap: 11 (ref 5–15)
Anion gap: 12 (ref 5–15)
BUN: 28 mg/dL — ABNORMAL HIGH (ref 8–23)
BUN: 27 mg/dL — ABNORMAL HIGH (ref 8–23)
CO2: 27 mmol/L (ref 22–32)
CO2: 27 mmol/L (ref 22–32)
Calcium: 8.7 mg/dL — ABNORMAL LOW (ref 8.9–10.3)
Calcium: 8.4 mg/dL — ABNORMAL LOW (ref 8.9–10.3)
Chloride: 98 mmol/L (ref 98–111)
Chloride: 98 mmol/L (ref 98–111)
Creatinine, Ser: 1.51 mg/dL — ABNORMAL HIGH (ref 0.44–1.00)
Creatinine, Ser: 1.66 mg/dL — ABNORMAL HIGH (ref 0.44–1.00)
GFR, Estimated: 33 mL/min — ABNORMAL LOW (ref >60)
GFR, Estimated: 29 mL/min — ABNORMAL LOW (ref >60)
Glucose, Bld: 104 mg/dL — ABNORMAL HIGH (ref 70–99)
Glucose, Bld: 178 mg/dL — ABNORMAL HIGH (ref 70–99)
Potassium: 4.2 mmol/L (ref 3.5–5.1)
Potassium: 3.3 mmol/L — ABNORMAL LOW (ref 3.5–5.1)
Sodium: 136 mmol/L (ref 135–145)
Sodium: 137 mmol/L (ref 135–145)

## 2023-06-20 LAB — COOXEMETRY PANEL
Carboxyhemoglobin: 2.3 % — ABNORMAL HIGH (ref 0.5–1.5)
Carboxyhemoglobin: 2.1 % — ABNORMAL HIGH (ref 0.5–1.5)
Methemoglobin: 0.9 % (ref 0.0–1.5)
Methemoglobin: <0.7 % (ref 0.0–1.5)
O2 Saturation: 74.4 %
O2 Saturation: 58 %
Total hemoglobin: 10 g/dL — ABNORMAL LOW (ref 12.0–16.0)
Total hemoglobin: 12.3 g/dL (ref 12.0–16.0)

## 2023-06-20 LAB — TROPONIN I (HIGH SENSITIVITY): Troponin I (High Sensitivity): >24000 ng/L (ref <18)

## 2023-06-20 LAB — APTT: aPTT: 110 s — ABNORMAL HIGH (ref 24–36)

## 2023-06-20 LAB — HEPARIN LEVEL (UNFRACTIONATED): Heparin Unfractionated: >1.1 [IU]/mL — ABNORMAL HIGH (ref 0.30–0.70)

## 2023-06-20 MED ORDER — DAPAGLIFLOZIN PROPANEDIOL 10 MG PO TABS: 10.0000 mg | ORAL_TABLET | Freq: Every day | ORAL | Status: DC

## 2023-06-20 MED ORDER — PANTOPRAZOLE SODIUM 40 MG PO TBEC
40.0000 mg | DELAYED_RELEASE_TABLET | Freq: Every day | ORAL | Status: DC
  Administered 2023-06-20 – 2023-06-28 (×9): 40 mg via ORAL
  Filled 2023-06-20 (×9): qty 1

## 2023-06-20 MED ORDER — POTASSIUM CHLORIDE 10 MEQ/50ML IV SOLN: 10.0000 meq | INTRAVENOUS | Status: DC

## 2023-06-20 MED ORDER — AMIODARONE IV BOLUS ONLY 150 MG/100ML
150.0000 mg | Freq: Once | INTRAVENOUS | Status: AC
  Administered 2023-06-20: 150 mg via INTRAVENOUS

## 2023-06-20 MED ORDER — POTASSIUM CHLORIDE 10 MEQ/50ML IV SOLN
10.0000 meq | INTRAVENOUS | Status: AC
  Administered 2023-06-20: 10 meq via INTRAVENOUS
  Filled 2023-06-20: qty 50

## 2023-06-20 MED ORDER — POTASSIUM CHLORIDE 20 MEQ PO PACK
60.0000 meq | PACK | Freq: Once | ORAL | Status: AC
  Administered 2023-06-20: 60 meq via ORAL
  Filled 2023-06-20: qty 3

## 2023-06-20 MED ORDER — LIDOCAINE BOLUS VIA INFUSION
70.0000 mg | Freq: Once | INTRAVENOUS | Status: AC
  Administered 2023-06-20: 70 mg via INTRAVENOUS
  Filled 2023-06-20: qty 72

## 2023-06-20 MED ORDER — MILRINONE LACTATE IN DEXTROSE 20-5 MG/100ML-% IV SOLN
0.1250 ug/kg/min | INTRAVENOUS | Status: DC
  Administered 2023-06-20: 0.125 ug/kg/min via INTRAVENOUS

## 2023-06-20 MED ORDER — MAGNESIUM SULFATE 2 GM/50ML IV SOLN
2.0000 g | Freq: Once | INTRAVENOUS | Status: AC
  Administered 2023-06-20: 2 g via INTRAVENOUS

## 2023-06-20 MED ORDER — MAGNESIUM SULFATE 2 GM/50ML IV SOLN
INTRAVENOUS | Status: AC
  Filled 2023-06-20: qty 50

## 2023-06-20 MED ORDER — LIDOCAINE IN D5W 4-5 MG/ML-% IV SOLN
0.5000 mg/min | INTRAVENOUS | Status: DC
  Administered 2023-06-21: 0.5 mg/min via INTRAVENOUS
  Filled 2023-06-20: qty 500

## 2023-06-20 MED ORDER — LIDOCAINE IN D5W 4-5 MG/ML-% IV SOLN
INTRAVENOUS | Status: AC
  Administered 2023-06-20: 1 mg/min via INTRAVENOUS
  Filled 2023-06-20: qty 500

## 2023-06-20 MED ORDER — AMIODARONE IV BOLUS ONLY 150 MG/100ML
INTRAVENOUS | Status: AC
  Filled 2023-06-20: qty 100

## 2023-06-20 MED ORDER — RIVAROXABAN 15 MG PO TABS
15.0000 mg | ORAL_TABLET | Freq: Every day | ORAL | Status: DC
  Administered 2023-06-20 – 2023-06-28 (×9): 15 mg via ORAL
  Filled 2023-06-20 (×9): qty 1

## 2023-06-20 MED ORDER — AMIODARONE HCL IN DEXTROSE 360-4.14 MG/200ML-% IV SOLN
INTRAVENOUS | Status: AC
  Administered 2023-06-20: 60 mg/h via INTRAVENOUS
  Filled 2023-06-20: qty 200

## 2023-06-20 MED ORDER — AMIODARONE HCL IN DEXTROSE 360-4.14 MG/200ML-% IV SOLN
30.0000 mg/h | INTRAVENOUS | Status: DC
  Administered 2023-06-21: 30 mg/h via INTRAVENOUS
  Filled 2023-06-20: qty 200

## 2023-06-20 MED ORDER — AMIODARONE HCL IN DEXTROSE 360-4.14 MG/200ML-% IV SOLN
60.0000 mg/h | INTRAVENOUS | Status: AC
  Administered 2023-06-20: 60 mg/h via INTRAVENOUS
  Filled 2023-06-20: qty 200

## 2023-06-20 NOTE — Telephone Encounter (Signed)
 Patient Product/process development scientist completed.    The patient is insured through [facility]. Patient has Medicare and is not eligible for a copay card, but may be able to apply for patient assistance or Medicare RX Payment Plan (Patient Must reach out to their plan, if eligible for payment plan), if available.    Ran test claim for Entresto 24-26 mg and the current 30 day co-pay is $47.00.  Ran test claim for Farxiga 10 mg and the current 30 day co-pay is $47.00.  Ran test claim for Jardiance 10 mg and the current 30 day co-pay is $47.00.   This test claim was processed through [facility]- copay amounts may vary at other pharmacies due to pharmacy/plan contracts, or as the patient moves through the different stages of their insurance plan.     Roland Earl, CPHT Pharmacy Technician III Certified Patient Advocate [facility] Patient Advocate Team Direct Number: [phone]  Fax: [phone]

## 2023-06-20 NOTE — Progress Notes (Addendum)
 Advanced Heart Failure Progress Update  Notified by RN that patient having long frequent runs of NSVT. On exam patient oriented and alert with almost continuous runs of NSVT. PA waveform with ventricular rhythm. PAC removed with resolution of NSVT temporary.  Continued to have persistent polymorphic NSVT with short sinus breakthrough.  - Amio bolus. Start amio gtt at 60/hr.  - Mg 2g IV - Lido bolus + 1 mg/hr - STAT BMET/Mg - replete K for goal >4  Janice Mattheu Brodersen, NP 06/20/23, 1:31 PM  Advanced Heart Failure Team Pager [phone] (M-F; 7a - 5p)

## 2023-06-20 NOTE — Plan of Care (Signed)

## 2023-06-20 NOTE — TOC Initial Note (Signed)
 Transition of Care [facility]) - Initial/Assessment Note    Patient Details  Name: [name] MRN: [id number] Date of Birth: Oct 24, 1931  [facility] CM/SW Contact:    Benjiman Bras, RN Phone Number: [phone] 06/20/2023, 5:34 PM  Clinical Narrative:                 TOC CM spoke to pt and dtr, Beth at bedside. Pt states she lives at home independent. Pt was driving some to appts, but dtr feels she will need assistance at home. Pt has Long-term policy, she plans to hire aide. She has a [employer] manager.   Expected Discharge Plan: Home w Home Health Services Barriers to Discharge: Continued Medical Work up   Patient Goals and CMS Choice Patient states their goals for this hospitalization and ongoing recovery are:: wants to get better and return home CMS Medicare.gov Compare Post Acute Care list provided to:: Patient Represenative (must comment) Choice offered to / list presented to : Adult Children      Expected Discharge Plan and Services   Discharge Planning Services: CM Consult Post Acute Care Choice: Home Health Living arrangements for the past 2 months: Single Family Home                                      Prior Living Arrangements/Services Living arrangements for the past 2 months: Single Family Home Lives with:: Self Patient language and need for interpreter reviewed:: Yes Do you feel safe going back to the place where you live?: Yes      Need for Family Participation in Patient Care: Yes (Comment) Care giver support system in place?: Yes (comment) Current home services: DME (rolling walker, cane) Criminal Activity/Legal Involvement Pertinent to Current Situation/Hospitalization: No - Comment as needed  Activities of Daily Living   ADL Screening (condition at time of admission) Independently performs ADLs?: Yes (appropriate for developmental age) Is the patient deaf or have difficulty hearing?: No Does the patient have difficulty seeing,  even when wearing glasses/contacts?: No Does the patient have difficulty concentrating, remembering, or making decisions?: No  Permission Sought/Granted Permission sought to share information with : Case Manager, Family Supports, PCP Permission granted to share information with : Yes, Verbal Permission Granted  Share Information with NAME: Kathrin Pares  Permission granted to share info w AGENCY: Home Health, DME, PCP  Permission granted to share info w Relationship: daughter  Permission granted to share info w Contact Information: [phone]  Emotional Assessment Appearance:: Appears stated age Attitude/Demeanor/Rapport: Engaged Affect (typically observed): Accepting Orientation: : Oriented to Self, Oriented to Place, Oriented to  Time, Oriented to Situation   Psych Involvement: No (comment)  Admission diagnosis:  Acute ST elevation myocardial infarction (STEMI) due to occlusion of left anterior descending (LAD) coronary artery (HCC) [I21.02] Patient Active Problem List   Diagnosis Date Noted   Acute ST elevation myocardial infarction (STEMI) due to occlusion of left anterior descending (LAD) coronary artery (HCC) 06/19/2023   Encounter for monitoring amiodarone  therapy 10/06/2022   Persistent atrial fibrillation (HCC) 09/08/2022   PAF (paroxysmal atrial fibrillation) (HCC) 08/24/2022   Hypercoagulable state due to persistent atrial fibrillation (HCC) 08/24/2022   Atrial fibrillation with rapid ventricular response (HCC) 12/30/2021   Atrial fibrillation with RVR (HCC) 12/30/2021   Chronic kidney disease, stage 3 unspecified (HCC) 07/22/2021   Drug-induced myopathy 07/22/2021   History of aortic valve replacement  07/22/2021   History of peptic ulcer 07/22/2021   Osteoporosis 07/22/2021   Personal history of malignant neoplasm of breast 07/22/2021   Rheumatic aortic stenosis with regurgitation 07/22/2021   Senile osteopenia 07/22/2021   Vitamin D  deficiency 07/22/2021   History of  total knee replacement, right 11/04/2019   Pain in joint of left knee 11/04/2019   Osteoarthritis of left knee 11/04/2019   Carotid artery disease (HCC)    ETD (Eustachian tube dysfunction), bilateral 05/10/2017   Seasonal allergic rhinitis 05/10/2017   Aortic stenosis, severe    Rheumatic fever    CAD (coronary artery disease), native coronary artery 10/20/2015   Weakness 05/08/2015   Breast cancer, right breast (HCC)    PUD (peptic ulcer disease)    Hypothyroidism    Hypercholesteremia    Obese 08/29/2013   S/P right THA, AA 08/28/2013   Chronic diastolic heart failure (HCC) 02/13/2013   SVT (supraventricular tachycardia) (HCC) 10/24/2012   PCP:  Elida Grounds, DO Pharmacy:   [facility] [phone] Jonette Nestle, [place] - [address] [place] [address] Phone: [phone] Fax: [phone]  [facility] [address] [place] [address] Phone: [phone] Fax: [phone]     Social Drivers of Health (SDOH) Social History: SDOH Screenings   Food Insecurity: No Food Insecurity (06/20/2023)  Housing: Low Risk  (06/20/2023)  Transportation Needs: No Transportation Needs (06/20/2023)  Utilities: Not At Risk (06/20/2023)  Social Connections: Moderately Isolated (06/20/2023)  Tobacco Use: Low Risk  (05/26/2023)   SDOH Interventions:     Readmission Risk Interventions     No data to display

## 2023-06-20 NOTE — Plan of Care (Signed)
  Problem: Education: Goal: Knowledge of General Education information will improve Description: Including pain rating scale, medication(s)/side effects and non-pharmacologic comfort measures Outcome: Progressing   Problem: Health Behavior/Discharge Planning: Goal: Ability to manage health-related needs will improve Outcome: Progressing   Problem: Clinical Measurements: Goal: Ability to maintain clinical measurements within normal limits will improve Outcome: Progressing Goal: Will remain free from infection Outcome: Progressing Goal: Diagnostic test results will improve Outcome: Progressing Goal: Respiratory complications will improve Outcome: Progressing Goal: Cardiovascular complication will be avoided Outcome: Progressing   Problem: Elimination: Goal: Will not experience complications related to urinary retention Outcome: Progressing   Problem: Elimination: Goal: Will not experience complications related to urinary retention Outcome: Progressing

## 2023-06-20 NOTE — Progress Notes (Signed)
 Advanced Heart Failure Rounding Note  Cardiologist: Antoinette Batman, MD  Chief Complaint: STEMI Patient Profile   Ms. Yearwood is a 89 y.o. female with 89 y.o. female with hyperlipidemia, SVT, PAF, severe AS s/p advert SAPIEN 3 bioprosthetic TAVR 2018, mild nonobstructive CAD previously.   Subjective:    6/15: Emergent Cath with 99% pLAD with minimal flow distally s/p DES. Lcx/RCA ok. LVEFP 15. EF 25%. RHC: RA 10 PA 74/25 (45) PCW 25 Fick 3.3/1.9 TD 2.9/1.7 on NE 7    Coox 74%. CVP 4. On Milrinone 0.25 Net - 2.5L. Cr up 1.2>1.5 with Lasix  80 mg IV.  Formal Echo pending.   Swan#s: PAP: (37-77)/(10-36) 41/14 CVP:  [0 mmHg-18 mmHg] 3 mmHg CO:  [3.6 L/min-3.7 L/min] 3.7 L/min CI:  [1.93 L/min/m2-1.98 L/min/m2] 1.98 L/min/m2  Sitting up in bed. Daughter at bedside. Feeling well this morning. Would like to get lines out.   Objective:    Weight Range: 72.8 kg Body mass index is 31.34 kg/m.   Vital Signs:   Temp:  [95.4 F (35.2 C)-99.9 F (37.7 C)] 99.5 F (37.5 C) (06/16 0500) Pulse Rate:  [0-78] 72 (06/16 0500) Resp:  [13-22] 15 (06/16 0500) BP: (74-127)/(46-75) 96/52 (06/16 0500) SpO2:  [89 %-99 %] 94 % (06/16 0500) Weight:  [72.8 kg] 72.8 kg (06/15 0822) Last BM Date : 06/19/23  Weight change: Filed Weights   06/19/23 0822  Weight: 72.8 kg   Intake/Output:  Intake/Output Summary (Last 24 hours) at 06/20/2023 0716 Last data filed at 06/20/2023 0600 Gross per 24 hour  Intake 188.31 ml  Output 2650 ml  Net -2461.69 ml    Physical Exam    CVP 4 General:Elderly, spry appearing. No distress on RA Cardiac: JVP flat. S1 and S2 present. No murmurs or rub. Extremities: Warm and dry.  No edema.  Neuro: Alert and oriented x3. Affect pleasant.  Lines/Devices:  RIJ introducer + PAC,   Telemetry   SR 60-70s (personally reviewed)  EKG    No new EKG to review  Labs    CBC Recent Labs    06/19/23 0812 06/19/23 0848 06/19/23 0936 06/20/23 0139  WBC  5.0  --   --  5.6  NEUTROABS 3.6  --   --   --   HGB 12.6   < > 12.2 11.7*  HCT 38.1   < > 36.0 35.8*  MCV 105.0*  --   --  105.3*  PLT 222  --   --  204   < > = values in this interval not displayed.   Basic Metabolic Panel Recent Labs    16/10/96 0812 06/19/23 0848 06/19/23 0934 06/19/23 0936 06/20/23 0139  NA 136 126*   < > 122* 136  K 4.4 3.5   < > 3.7 4.2  CL 98 87*  --   --  98  CO2 24  --   --   --  27  GLUCOSE 135* 104*  --   --  104*  BUN 31* 31*  --   --  28*  CREATININE 1.49* 1.20*  --   --  1.51*  CALCIUM 9.0  --   --   --  8.7*  MG  --   --   --   --  2.4   < > = values in this interval not displayed.   Liver Function Tests Recent Labs    06/19/23 0812  AST 27  ALT 12  ALKPHOS 59  BILITOT  1.2  PROT 6.7  ALBUMIN  3.4*   ProBNP (last 3 results) Recent Labs    11/01/22 0845 11/09/22 0857  PROBNP 348 554   Hemoglobin A1C Recent Labs    06/19/23 0815  HGBA1C 5.1   Fasting Lipid Panel Recent Labs    06/20/23 0139  CHOL 171  HDL 52  LDLCALC 108*  TRIG 54  CHOLHDL 3.3   Imaging   CARDIAC CATHETERIZATION Result Date: 06/19/2023 Images from the original result were not included. Coronary angiography and intervention 06/19/2023: LM: No significant disease LAD: Mid vessel 99% stenosis with minimal flow distally, followed by mild mid 30% disease.         Large diagonal 1, ostial 90% stenosis Lcx: No significant disease RCA: Dominant vessel, no significant disease LVEDP 15 mmHg Successful percutaneous coronary intervention mid LAD        PTCA and stent placement 3.0 X 20 mm Synergy drug-eluting stent        Postdilatation using 3.5 x 15 mm [place] balloon up to 16 atm Right heart catheterization 06/19/2023: RA: 10 mmHg RV: 77/10 mmHg PA: 74/25 mmHg, mPAP 45 mmHg PCW: 25 mmHg AO sats: 97% PA sats: 57% Fick: CO: 3.25 L/min CI: 1.94 L/min/m2 TD: CO: 2.93 L/min CI: 1.75 L/min/m2 Patient is in cardiogenic shock due to anterior STEMI, with LVEF 15% with severe global  hypokinesis, and anterior akinesis.  Lactic acid was 0.6.  She is currently on 7 mics of norepinephrine .  Given her age, recent use of Xarelto , I would prefer managing her medically as much as possible to avoid large-bore access and risk of complications for hemodynamic support device.  Discussed with Dr. Julane Ny who agrees. Cody Das, MD   DG CHEST PORT 1 VIEW Result Date: 06/19/2023 CLINICAL DATA:  STEMI, Swan-Ganz catheter placement EXAM: PORTABLE CHEST 1 VIEW COMPARISON:  12/30/2021 FINDINGS: Single frontal view of the chest demonstrates right internal jugular flow directed central venous catheter tip overlying right pulmonary artery. Aortic valve prosthesis again noted. Cardiac silhouette remains enlarged. Mild pulmonary vascular congestion without airspace disease, effusion, or pneumothorax. No acute bony abnormalities. IMPRESSION: 1. No complication after right internal jugular flow directed central venous catheter placement. 2. Mild pulmonary vascular congestion. Electronically Signed   By: Bobbye Burrow M.D.   On: 06/19/2023 16:33   Medications:    Scheduled Medications:  amiodarone   200 mg Oral Daily   aspirin  EC  81 mg Oral Daily   Chlorhexidine  Gluconate Cloth  6 each Topical Daily   clopidogrel   75 mg Oral Daily   digoxin  0.0625 mg Oral Daily   furosemide   80 mg Intravenous BID   levothyroxine   112 mcg Oral Q0600   sodium chloride  flush  3 mL Intravenous Q12H    Infusions:  sodium chloride  10 mL/hr at 06/19/23 1900   sodium chloride      heparin  950 Units/hr (06/20/23 0321)   milrinone 0.25 mcg/kg/min (06/20/23 0542)   norepinephrine  (LEVOPHED ) Adult infusion Stopped (06/19/23 1139)    PRN Medications: sodium chloride , acetaminophen , ondansetron  (ZOFRAN ) IV, mouth rinse, sodium chloride  flush  Assessment/Plan   CAD with acute anterior STEMI - Cath 06/19/23: 99% pLAD with minimal flow distally s/p DES. Lcx/RCA ok EF 25%  - DAPT w ASA + Plavix  for now, plan to  stop ASA at discharge with DOAC   Cardiogenic shock in setting of #1 - Ischemic CM: EF ~25% on cath (EF previously 55-60% in 11/23) - RHC 6/15: RA 10 PA 74/25 (45) PCW 25  Fick 3.3/1.9 TD 2.9/1.7 on NE 7 in cath lab   - Hemodynamics as above. - Coox 74. Stop milrinone. Repeat Coox this afternoon and plan to pull PAC.  - Hold diuresis. Euvolemic. - GDMT limited by soft BP - Continue digoxin 0.0625 mg daily - Formal echo pending   Pulmonary HTN - mixed picture (both pre and post-capillary) - at time of TAVR in 2018 PA 42/6 (21) PCWP 11 - PA 41/14 this morning - should improved with treatment of HF   H/o AS s/p TAVR 2018 - formal echo pending    PAF - stop heparin ; resume Xarelto  - in NSR. Continue amio - DAPT w ASA + Plavix  for now, plan to stop ASA at discharge with DOAC  Length of Stay: 1  CRITICAL CARE Performed by: [place] Cambrie Sonnenfeld  Total critical care time: 12 minutes  Critical care time was exclusive of separately billable procedures and treating other patients.  Critical care was necessary to treat or prevent imminent or life-threatening deterioration.  Critical care was time spent personally by me on the following activities: development of treatment plan with patient and/or surrogate as well as nursing, discussions with consultants, evaluation of patient's response to treatment, examination of patient, obtaining history from patient or surrogate, ordering and performing treatments and interventions, ordering and review of laboratory studies, ordering and review of radiographic studies, pulse oximetry and re-evaluation of patient's condition.  [place] Misako Roeder, NP  06/20/2023, 7:16 AM  Advanced Heart Failure Team Pager [phone] (M-F; 7a - 5p) Amion for HF Team after hours.

## 2023-06-20 NOTE — Progress Notes (Signed)
 ANTICOAGULATION CONSULT NOTE  Pharmacy Consult for heparin  Indication: atrial fibrillation  Allergies  Allergen Reactions   Compazine [Prochlorperazine] Other (See Comments)    Unknown, pt does not remember   Fenofibrate Rash   Floxin [Ofloxacin] Other (See Comments)    Unknown, pt does not remember reaction but was allergic reaction   Statins Other (See Comments)    Myalgia - Atorvastatin, simvastatin, pitavastatin    Patient Measurements: Weight: 72.8 kg (160 lb 7.9 oz) Heparin  Dosing Weight: TBW  Vital Signs: Temp: 99.9 F (37.7 C) (06/16 0200) BP: 90/50 (06/16 0130) Pulse Rate: 70 (06/16 0200)  Labs: Recent Labs    06/19/23 0812 06/19/23 0848 06/19/23 0934 06/19/23 0936 06/20/23 0139  HGB 12.6 10.9* 11.9*  12.2 12.2 11.7*  HCT 38.1 32.0* 35.0*  36.0 36.0 35.8*  PLT 222  --   --   --  204  APTT 42*  --   --   --  110*  LABPROT 22.7*  --   --   --   --   INR 2.0*  --   --   --   --   HEPARINUNFRC  --   --   --   --  >1.10*  CREATININE 1.49* 1.20*  --   --  1.51*  TROPONINIHS 777*  --   --   --  >24,000*    Estimated Creatinine Clearance: 22.5 mL/min (A) (by C-G formula based on SCr of 1.51 mg/dL (H)).  Medical History: Past Medical History:  Diagnosis Date   Arthritis    hands, knees   CAD (coronary artery disease), native coronary artery cardiologist--- dr Abel Hoe   a. mild non obst CAD per cath 07-18-2015   Carotid artery disease (HCC)    Carotid US  09/2018: Bilateral ICA 40-59 // Carotid US  11/21: Bilateral ICA 40-59; repeat 1 year    Chronic diastolic CHF (congestive heart failure) (HCC)    followed by cardiology   CKD (chronic kidney disease), stage III (HCC)    followed by pcp   Dyspnea    per pt when she walks which is usual for her   History of gastric ulcer    remote yrs ago   History of rheumatic fever as a child    History of right breast cancer 2006   s/p right partial mastectomy w/ node dissection's,  completed chemo/ radiation same  year  (05-21-2020 pt stated no recurrence)   Hyperlipidemia    Hypothyroidism    followed by pcp   Osteoporosis    PMB (postmenopausal bleeding)    PSVT (paroxysmal supraventricular tachycardia) (HCC) 10/2012   in ED resolved w/ vagal maneuver    S/P aortic valve replacement 07/27/2016   for severe stenosis;  last echo in epic 09-22-2018 ef 55-60%, mild TR, aortic valve area 1.4cm^2 and mean gradiant 17 mmHg   Seasonal allergies    Vitamin D  deficiency     Medications:  See MAR  Assessment: 89 yo female presents as code STEMI s/p LAD stent.  Hx AF on Xarelto  (last dose 6/14 PM).  Pharmacy consulted for heparin  dosing.  TR band removed ~1500 - heparin  to restart @ 1700.   6/16 AM update:  aPTT supra-therapeutic  Goal of Therapy:  Heparin  level 0.3-0.7 units/ml aPTT 66-102 seconds Monitor platelets by anticoagulation protocol: Yes   Plan:  Dec heparin  to 950 units/hr Heparin  level and aPTT in 8 hours  Silvestre Drum, PharmD, BCPS Clinical Pharmacist Phone: [phone]

## 2023-06-21 ENCOUNTER — Inpatient Hospital Stay (HOSPITAL_COMMUNITY)

## 2023-06-21 LAB — LIDOCAINE LEVEL: Lidocaine Lvl: 6.6 ug/mL — ABNORMAL HIGH (ref 1.5–5.0)

## 2023-06-21 LAB — BASIC METABOLIC PANEL WITH GFR
Anion gap: 11 (ref 5–15)
Anion gap: 11 (ref 5–15)
BUN: 33 mg/dL — ABNORMAL HIGH (ref 8–23)
BUN: 40 mg/dL — ABNORMAL HIGH (ref 8–23)
CO2: 24 mmol/L (ref 22–32)
CO2: 25 mmol/L (ref 22–32)
Calcium: 8.4 mg/dL — ABNORMAL LOW (ref 8.9–10.3)
Calcium: 8.7 mg/dL — ABNORMAL LOW (ref 8.9–10.3)
Chloride: 98 mmol/L (ref 98–111)
Chloride: 94 mmol/L — ABNORMAL LOW (ref 98–111)
Creatinine, Ser: 1.82 mg/dL — ABNORMAL HIGH (ref 0.44–1.00)
Creatinine, Ser: 2.34 mg/dL — ABNORMAL HIGH (ref 0.44–1.00)
GFR, Estimated: 26 mL/min — ABNORMAL LOW (ref >60)
GFR, Estimated: 19 mL/min — ABNORMAL LOW (ref >60)
Glucose, Bld: 190 mg/dL — ABNORMAL HIGH (ref 70–99)
Glucose, Bld: 137 mg/dL — ABNORMAL HIGH (ref 70–99)
Potassium: 4.9 mmol/L (ref 3.5–5.1)
Potassium: 4.9 mmol/L (ref 3.5–5.1)
Sodium: 133 mmol/L — ABNORMAL LOW (ref 135–145)
Sodium: 130 mmol/L — ABNORMAL LOW (ref 135–145)

## 2023-06-21 LAB — CBC
HCT: 36.2 % (ref 36.0–46.0)
Hemoglobin: 11.8 g/dL — ABNORMAL LOW (ref 12.0–15.0)
MCH: 34.6 pg — ABNORMAL HIGH (ref 26.0–34.0)
MCHC: 32.6 g/dL (ref 30.0–36.0)
MCV: 106.2 fL — ABNORMAL HIGH (ref 80.0–100.0)
Platelets: 223 10*3/uL (ref 150–400)
RBC: 3.41 MIL/uL — ABNORMAL LOW (ref 3.87–5.11)
RDW: 13.8 % (ref 11.5–15.5)
WBC: 9.7 10*3/uL (ref 4.0–10.5)
nRBC: 0 % (ref 0.0–0.2)

## 2023-06-21 LAB — MAGNESIUM
Magnesium: 2.8 mg/dL — ABNORMAL HIGH (ref 1.7–2.4)
Magnesium: 2.6 mg/dL — ABNORMAL HIGH (ref 1.7–2.4)

## 2023-06-21 LAB — COOXEMETRY PANEL
Carboxyhemoglobin: 1.5 % (ref 0.5–1.5)
Methemoglobin: <0.7 % (ref 0.0–1.5)
O2 Saturation: 52.8 %
Total hemoglobin: 11.9 g/dL — ABNORMAL LOW (ref 12.0–16.0)

## 2023-06-21 MED ORDER — DEXTROSE 5 % IV SOLN
300.0000 mg | Freq: Once | INTRAVENOUS | Status: AC
  Administered 2023-06-21: 300 mg via INTRAVENOUS
  Filled 2023-06-21: qty 6

## 2023-06-21 MED ORDER — AMIODARONE HCL IN DEXTROSE 360-4.14 MG/200ML-% IV SOLN
30.0000 mg/h | INTRAVENOUS | Status: DC
  Administered 2023-06-22 – 2023-06-23 (×3): 30 mg/h via INTRAVENOUS
  Filled 2023-06-21 (×4): qty 200

## 2023-06-21 MED ORDER — OXYCODONE HCL 5 MG PO TABS
2.5000 mg | ORAL_TABLET | Freq: Four times a day (QID) | ORAL | Status: DC | PRN
  Administered 2023-06-21 – 2023-06-23 (×3): 2.5 mg via ORAL
  Filled 2023-06-21 (×3): qty 1

## 2023-06-21 MED ORDER — ACETAMINOPHEN 325 MG PO TABS
650.0000 mg | ORAL_TABLET | ORAL | Status: DC | PRN
  Administered 2023-06-21 – 2023-06-28 (×13): 650 mg via ORAL
  Filled 2023-06-21 (×14): qty 2

## 2023-06-21 MED ORDER — AMIODARONE HCL IN DEXTROSE 360-4.14 MG/200ML-% IV SOLN
60.0000 mg/h | INTRAVENOUS | Status: AC
  Administered 2023-06-21: 60 mg/h via INTRAVENOUS

## 2023-06-21 MED ORDER — AMIODARONE HCL IN DEXTROSE 360-4.14 MG/200ML-% IV SOLN
INTRAVENOUS | Status: AC
  Administered 2023-06-21: 150 mg via INTRAVENOUS
  Filled 2023-06-21: qty 200

## 2023-06-21 MED ORDER — FUROSEMIDE 10 MG/ML IJ SOLN: 40.0000 mg | Freq: Once | INTRAMUSCULAR | Status: DC

## 2023-06-21 MED ORDER — LIDOCAINE 5 % EX PTCH
1.0000 | MEDICATED_PATCH | CUTANEOUS | Status: DC
  Administered 2023-06-21 – 2023-06-28 (×8): 1 via TRANSDERMAL
  Filled 2023-06-21 (×8): qty 1

## 2023-06-21 MED ORDER — FUROSEMIDE 10 MG/ML IJ SOLN
80.0000 mg | Freq: Once | INTRAMUSCULAR | Status: AC
  Administered 2023-06-21: 80 mg via INTRAVENOUS
  Filled 2023-06-21: qty 8

## 2023-06-21 MED ORDER — AMIODARONE LOAD VIA INFUSION
150.0000 mg | Freq: Once | INTRAVENOUS | Status: AC
  Filled 2023-06-21: qty 83.34

## 2023-06-21 NOTE — Plan of Care (Signed)

## 2023-06-21 NOTE — Progress Notes (Signed)
   06/21/23 0150  Spiritual Encounters  Type of Visit Initial  Care provided to: Pt not available  Referral source Code page  Reason for visit Code  OnCall Visit No   Chaplain responded to a code blue. Chaplain support not required at this time.   Clarence Croak  [facility]  [phone]

## 2023-06-21 NOTE — Progress Notes (Signed)
 Advanced Heart Failure Rounding Note  Cardiologist: Antoinette Batman, MD  AHF Cardiologist: Dan Bensimhon, MD Chief Complaint: VT/VF Patient Profile   Ms. Rohrer is a 89 y.o. female with 89 y.o. female with hyperlipidemia, SVT, PAF, severe AS s/p advert SAPIEN 3 bioprosthetic TAVR 2018, mild nonobstructive CAD previously.   Subjective:    6/15: Emergent Cath with 99% pLAD with minimal flow distally s/p DES. Lcx/RCA ok. LVEFP 15. EF 25%. RHC: RA 10 PA 74/25 (45) PCW 25 Fick 3.3/1.9 TD 2.9/1.7 on NE 7  6/16: VT/NSVT. Started on IV lido + amio.    Sustained VT/VF overnight requiring 1 round of CPR. Has remained in NSR this morning. Coox 52%. Off Milrinone with VT. On Lido 1mg /min + Amio 30mg /hr.  Cr trending up 1.51>1.66>1.82.  Sitting up in bed. Chest sore. Does have a cough that developed overnight. No SOB. No anginal chest pain.   Objective:    Weight Range: 72.8 kg Body mass index is 31.34 kg/m.   Vital Signs:   Temp:  [88.5 F (31.4 C)-99.3 F (37.4 C)] 98.5 F (36.9 C) (06/17 0340) Pulse Rate:  [40-83] 40 (06/17 0600) Resp:  [13-28] 19 (06/17 0600) BP: (80-126)/(39-78) 92/54 (06/17 0600) SpO2:  [87 %-99 %] 95 % (06/17 0600) Last BM Date : 06/20/23  Weight change: Filed Weights   06/19/23 0822  Weight: 72.8 kg   Intake/Output:  Intake/Output Summary (Last 24 hours) at 06/21/2023 0716 Last data filed at 06/21/2023 0600 Gross per 24 hour  Intake 1642.36 ml  Output 1150 ml  Net 492.36 ml    Physical Exam    General: Elderly appearing. No distress on RA Cardiac: JVP ~7cm. S1 and S2 present. No murmurs or rub. Resp: Lung coarse Extremities: Warm and dry.  No peripheral edema.  Neuro: Alert and oriented x3. Affect pleasant.  Lines/Devices:  RIJ introducer  Telemetry   SB in 50s. Episodes of NSVT from 0200-0300 and 1 sustained VT/VF (personally reviewed)  EKG    No new EKG to review  Labs    CBC Recent Labs    06/19/23 0812 06/19/23 0848  06/20/23 0139 06/21/23 0312  WBC 5.0  --  5.6 9.7  NEUTROABS 3.6  --   --   --   HGB 12.6   < > 11.7* 11.8*  HCT 38.1   < > 35.8* 36.2  MCV 105.0*  --  105.3* 106.2*  PLT 222  --  204 223   < > = values in this interval not displayed.   Basic Metabolic Panel Recent Labs    16/10/96 1417 06/21/23 0312  NA 137 133*  K 3.3* 4.9  CL 98 98  CO2 27 24  GLUCOSE 178* 190*  BUN 27* 33*  CREATININE 1.66* 1.82*  CALCIUM 8.4* 8.4*  MG 2.1 2.8*   Liver Function Tests Recent Labs    06/19/23 0812  AST 27  ALT 12  ALKPHOS 59  BILITOT 1.2  PROT 6.7  ALBUMIN  3.4*   ProBNP (last 3 results) Recent Labs    11/01/22 0845 11/09/22 0857  PROBNP 348 554   Hemoglobin A1C Recent Labs    06/19/23 0815  HGBA1C 5.1   Fasting Lipid Panel Recent Labs    06/20/23 0139  CHOL 171  HDL 52  LDLCALC 108*  TRIG 54  CHOLHDL 3.3   Imaging   ECHOCARDIOGRAM COMPLETE Result Date: 06/20/2023    ECHOCARDIOGRAM REPORT   Patient Name:   [name] Date of  Exam: 06/20/2023 Medical Rec #:  [id number]        Height:       60.0 in Accession #:    [id number]       Weight:       160.5 lb Date of Birth:  [birth year]       BSA:          1.700 m Patient Age:    89 years         BP:           94/50 mmHg Patient Gender: F                HR:           71 bpm. Exam Location:  Inpatient Procedure: 2D Echo, Cardiac Doppler and Color Doppler (Both Spectral and Color            Flow Doppler were utilized during procedure). Indications:    CHF  History:        Patient has prior history of Echocardiogram examinations. CHF.                 Aortic Valve: 26 mm valve is present in the aortic position.  Sonographer:    Farrell Honey Key Referring Phys: [id number] DANIEL R BENSIMHON IMPRESSIONS  1. Systolic dysfunction is new and consistent with LAD infarct.  2. Left ventricular ejection fraction, by estimation, is 40 to 45%. The left ventricle has mildly decreased function. The left ventricle demonstrates regional wall motion  abnormalities (see scoring diagram/findings for description). Left ventricular diastolic parameters are consistent with Grade II diastolic dysfunction (pseudonormalization). Elevated left ventricular end-diastolic pressure.  3. Right ventricular systolic function is normal. The right ventricular size is normal. There is normal pulmonary artery systolic pressure.  4. Left atrial size was moderately dilated.  5. The mitral valve is normal in structure. Trivial mitral valve regurgitation. No evidence of mitral stenosis.  6. The aortic valve has been repaired/replaced. Aortic valve regurgitation is mild. Mild aortic valve stenosis. There is a 26 mm valve present in the aortic position. Echo findings are consistent with normal structure and function of the aortic valve prosthesis. Aortic valve area, by VTI measures 0.88 cm. Aortic valve mean gradient measures 11.0 mmHg. Aortic valve Vmax measures 2.13 m/s.  7. The inferior vena cava is normal in size with greater than 50% respiratory variability, suggesting right atrial pressure of 3 mmHg. FINDINGS  Left Ventricle: Left ventricular ejection fraction, by estimation, is 40 to 45%. The left ventricle has mildly decreased function. The left ventricle demonstrates regional wall motion abnormalities. The left ventricular internal cavity size was normal in size. There is no left ventricular hypertrophy. Left ventricular diastolic parameters are consistent with Grade II diastolic dysfunction (pseudonormalization). Elevated left ventricular end-diastolic pressure.  LV Wall Scoring: The entire septum, apical anterior segment, and apex are hypokinetic. The anterior wall, entire lateral wall, and entire inferior wall are normal. Right Ventricle: The right ventricular size is normal. No increase in right ventricular wall thickness. Right ventricular systolic function is normal. There is normal pulmonary artery systolic pressure. The tricuspid regurgitant velocity is 1.92 m/s, and   with an assumed right atrial pressure of 3 mmHg, the estimated right ventricular systolic pressure is 17.7 mmHg. Left Atrium: Left atrial size was moderately dilated. Right Atrium: Right atrial size was normal in size. Pericardium: There is no evidence of pericardial effusion. Mitral Valve: The mitral valve is normal in structure. Trivial mitral valve regurgitation. No evidence  of mitral valve stenosis. Tricuspid Valve: The tricuspid valve is normal in structure. Tricuspid valve regurgitation is mild . No evidence of tricuspid stenosis. Aortic Valve: The aortic valve has been repaired/replaced. Aortic valve regurgitation is mild. Mild aortic stenosis is present. Aortic valve mean gradient measures 11.0 mmHg. Aortic valve peak gradient measures 18.1 mmHg. Aortic valve area, by VTI measures 0.88 cm. There is a 26 mm valve present in the aortic position. Echo findings are consistent with normal structure and function of the aortic valve prosthesis. Pulmonic Valve: The pulmonic valve was normal in structure. Pulmonic valve regurgitation is mild to moderate. No evidence of pulmonic stenosis. Aorta: The aortic root is normal in size and structure. Venous: The inferior vena cava is normal in size with greater than 50% respiratory variability, suggesting right atrial pressure of 3 mmHg. IAS/Shunts: No atrial level shunt detected by color flow Doppler.  LEFT VENTRICLE PLAX 2D LVIDd:         4.40 cm     Diastology LVIDs:         3.30 cm     LV e' medial:    4.79 cm/s LV PW:         1.10 cm     LV E/e' medial:  27.6 LV IVS:        1.20 cm     LV e' lateral:   3.59 cm/s LVOT diam:     1.50 cm     LV E/e' lateral: 36.8 LV SV:         36 LV SV Index:   21 LVOT Area:     1.77 cm  LV Volumes (MOD) LV vol d, MOD A2C: 75.9 ml LV vol d, MOD A4C: 94.0 ml LV vol s, MOD A2C: 33.8 ml LV vol s, MOD A4C: 39.3 ml LV SV MOD A2C:     42.1 ml LV SV MOD A4C:     94.0 ml LV SV MOD BP:      48.2 ml RIGHT VENTRICLE RV Basal diam:  3.70 cm RV S  prime:     16.90 cm/s LEFT ATRIUM             Index        RIGHT ATRIUM           Index LA diam:        4.60 cm 2.71 cm/m   RA Area:     14.00 cm LA Vol (A2C):   87.0 ml 51.18 ml/m  RA Volume:   37.80 ml  22.24 ml/m LA Vol (A4C):   37.8 ml 22.24 ml/m LA Biplane Vol: 60.9 ml 35.82 ml/m  AORTIC VALVE AV Area (Vmax):    0.85 cm AV Area (Vmean):   0.89 cm AV Area (VTI):     0.88 cm AV Vmax:           213.00 cm/s AV Vmean:          158.000 cm/s AV VTI:            0.414 m AV Peak Grad:      18.1 mmHg AV Mean Grad:      11.0 mmHg LVOT Vmax:         103.00 cm/s LVOT Vmean:        79.500 cm/s LVOT VTI:          0.206 m LVOT/AV VTI ratio: 0.50  AORTA Ao Root diam: 2.80 cm Ao Asc diam:  3.10 cm MITRAL VALVE  TRICUSPID VALVE MV Area (PHT): 4.89 cm     TR Peak grad:   14.7 mmHg MV Decel Time: 155 msec     TR Vmax:        192.00 cm/s MV E velocity: 132.00 cm/s MV A velocity: 74.90 cm/s   SHUNTS MV E/A ratio:  1.76         Systemic VTI:  0.21 m                             Systemic Diam: 1.50 cm Maudine Sos MD Electronically signed by Maudine Sos MD Signature Date/Time: 06/20/2023/12:03:57 PM    Final    Medications:    Scheduled Medications:  aspirin  EC  81 mg Oral Daily   Chlorhexidine  Gluconate Cloth  6 each Topical Daily   clopidogrel   75 mg Oral Daily   digoxin  0.0625 mg Oral Daily   levothyroxine   112 mcg Oral Q0600   pantoprazole   40 mg Oral Daily   rivaroxaban   15 mg Oral Q supper   sodium chloride  flush  3 mL Intravenous Q12H    Infusions:  amiodarone  30 mg/hr (06/21/23 0600)   lidocaine  1 mg/min (06/21/23 0600)    PRN Medications: acetaminophen , ondansetron  (ZOFRAN ) IV, mouth rinse, sodium chloride  flush  Assessment/Plan   CAD with acute anterior STEMI - Cath 06/19/23: 99% pLAD with minimal flow distally s/p DES. Lcx/RCA ok EF 25%  - Xarelto  + ASA/Plavix  for now, plan to stop ASA at discharge with DOAC - now with post MI VT   Cardiogenic shock in setting of  #1 - Ischemic CM: EF ~25% on cath (EF previously 55-60% in 11/23) - RHC 6/15: RA 10 PA 74/25 (45) PCW 25 Fick 3.3/1.9 TD 2.9/1.7 on NE 7 in cath lab   - Echo 6/16: EF 40-45%, G2DD, nl RV - Hemodynamics as above. - Coox 53%, likely low in the setting of arrhythmias. Off Milrinone.  - CXR with increasing pulm edema. Give IV Lasix  80 mg today - GDMT limited by soft BP - Stop digoxin bradycardia   VT/VF NSVT - developed persistent, long runs of NSVT yesterday on tele. Started on Amio and Lidocaine . Lytes replaced - overnight had sustained VT/VF with lost pulse, received 1 round CPR + 300 mg amio bolus with ROSC and conversion to NSR. - Keep K >4 and Mg >2; lytes okay this morning - continue lido 1 mg/min - continue amio 30/hr - may need LifeVest in short term at discharge pending rhythm and patient/family GOC - Follow tele  Pulmonary HTN - mixed picture (both pre and post-capillary) - at time of TAVR in 2018 PA 42/6 (21) PCWP 11 - PA 41/14 this morning - should improved with treatment of HF   H/o AS s/p TAVR 2018 - mild AI and AS. Stable valve. AVA 0.88cm2.    PAF - continue Xarelto  - continue amio - DAPT w ASA + Plavix  for now, plan to stop ASA at discharge with DOAC  AKI on CKD2 - b/l Cr 1.01 - up to 1.82 today; diuresis as above  Length of Stay: 2  CRITICAL CARE Performed by: [place] Anjeli Casad  Total critical care time: 12 minutes  Critical care time was exclusive of separately billable procedures and treating other patients.  Critical care was necessary to treat or prevent imminent or life-threatening deterioration.  Critical care was time spent personally by me on the following activities: development of treatment plan with patient  and/or surrogate as well as nursing, discussions with consultants, evaluation of patient's response to treatment, examination of patient, obtaining history from patient or surrogate, ordering and performing treatments and interventions, ordering  and review of laboratory studies, ordering and review of radiographic studies, pulse oximetry and re-evaluation of patient's condition.  [place] Vester Balthazor, NP  06/21/2023, 7:16 AM  Advanced Heart Failure Team Pager [phone] (M-F; 7a - 5p) Amion for HF Team after hours.

## 2023-06-22 DIAGNOSIS — I480 Paroxysmal atrial fibrillation: Secondary | ICD-10-CM | POA: Diagnosis not present

## 2023-06-22 DIAGNOSIS — I4821 Permanent atrial fibrillation: Secondary | ICD-10-CM | POA: Diagnosis not present

## 2023-06-22 DIAGNOSIS — I10 Essential (primary) hypertension: Secondary | ICD-10-CM | POA: Diagnosis not present

## 2023-06-22 DIAGNOSIS — N183 Chronic kidney disease, stage 3 unspecified: Secondary | ICD-10-CM | POA: Diagnosis not present

## 2023-06-22 LAB — COOXEMETRY PANEL
Carboxyhemoglobin: 1.8 % — ABNORMAL HIGH (ref 0.5–1.5)
Methemoglobin: <0.7 % (ref 0.0–1.5)
O2 Saturation: 58.2 %
Total hemoglobin: 10.1 g/dL — ABNORMAL LOW (ref 12.0–16.0)

## 2023-06-22 LAB — BASIC METABOLIC PANEL WITH GFR
Anion gap: 8 (ref 5–15)
Anion gap: 16 — ABNORMAL HIGH (ref 5–15)
BUN: 43 mg/dL — ABNORMAL HIGH (ref 8–23)
BUN: 41 mg/dL — ABNORMAL HIGH (ref 8–23)
CO2: 25 mmol/L (ref 22–32)
CO2: 23 mmol/L (ref 22–32)
Calcium: 7.8 mg/dL — ABNORMAL LOW (ref 8.9–10.3)
Calcium: 8.4 mg/dL — ABNORMAL LOW (ref 8.9–10.3)
Chloride: 90 mmol/L — ABNORMAL LOW (ref 98–111)
Chloride: 87 mmol/L — ABNORMAL LOW (ref 98–111)
Creatinine, Ser: 2.05 mg/dL — ABNORMAL HIGH (ref 0.44–1.00)
Creatinine, Ser: 1.79 mg/dL — ABNORMAL HIGH (ref 0.44–1.00)
GFR, Estimated: 23 mL/min — ABNORMAL LOW (ref >60)
GFR, Estimated: 27 mL/min — ABNORMAL LOW (ref >60)
Glucose, Bld: 119 mg/dL — ABNORMAL HIGH (ref 70–99)
Glucose, Bld: 126 mg/dL — ABNORMAL HIGH (ref 70–99)
Potassium: 4.4 mmol/L (ref 3.5–5.1)
Potassium: 3.7 mmol/L (ref 3.5–5.1)
Sodium: 123 mmol/L — ABNORMAL LOW (ref 135–145)
Sodium: 126 mmol/L — ABNORMAL LOW (ref 135–145)

## 2023-06-22 LAB — CBC
HCT: 29.8 % — ABNORMAL LOW (ref 36.0–46.0)
Hemoglobin: 9.7 g/dL — ABNORMAL LOW (ref 12.0–15.0)
MCH: 33.8 pg (ref 26.0–34.0)
MCHC: 32.6 g/dL (ref 30.0–36.0)
MCV: 103.8 fL — ABNORMAL HIGH (ref 80.0–100.0)
Platelets: 172 10*3/uL (ref 150–400)
RBC: 2.87 MIL/uL — ABNORMAL LOW (ref 3.87–5.11)
RDW: 13.5 % (ref 11.5–15.5)
WBC: 7.5 10*3/uL (ref 4.0–10.5)
nRBC: 0 % (ref 0.0–0.2)

## 2023-06-22 LAB — MAGNESIUM: Magnesium: 2.5 mg/dL — ABNORMAL HIGH (ref 1.7–2.4)

## 2023-06-22 LAB — LIDOCAINE LEVEL: Lidocaine Lvl: 4 ug/mL (ref 1.5–5.0)

## 2023-06-22 MED ORDER — POTASSIUM CHLORIDE CRYS ER 20 MEQ PO TBCR
20.0000 meq | EXTENDED_RELEASE_TABLET | Freq: Once | ORAL | Status: AC
  Administered 2023-06-22: 20 meq via ORAL
  Filled 2023-06-22: qty 1

## 2023-06-22 MED ORDER — MEXILETINE HCL 150 MG PO CAPS: 150.0000 mg | ORAL_CAPSULE | Freq: Three times a day (TID) | ORAL | Status: DC

## 2023-06-22 MED ORDER — FUROSEMIDE 10 MG/ML IJ SOLN
80.0000 mg | Freq: Once | INTRAMUSCULAR | Status: AC
  Administered 2023-06-22: 80 mg via INTRAVENOUS
  Filled 2023-06-22: qty 8

## 2023-06-22 MED ORDER — MEXILETINE HCL 150 MG PO CAPS
150.0000 mg | ORAL_CAPSULE | Freq: Three times a day (TID) | ORAL | Status: DC
  Administered 2023-06-22 – 2023-06-28 (×19): 150 mg via ORAL
  Filled 2023-06-22 (×19): qty 1

## 2023-06-22 NOTE — Progress Notes (Signed)
 Advanced Heart Failure Rounding Note  Cardiologist: Antoinette Batman, MD  [facility] Cardiologist: Dan Bensimhon, MD Chief Complaint: VT/VF Patient Profile   [name] is a 89 y.o. female with 89 y.o. female with hyperlipidemia, SVT, PAF, severe AS s/p advert SAPIEN 3 bioprosthetic TAVR 2018, mild nonobstructive CAD previously.   Subjective:    6/15: Emergent Cath with 99% pLAD with minimal flow distally s/p DES. Lcx/RCA ok. LVEFP 15. EF 25%. RHC: RA 10 PA 74/25 (45) PCW 25 Fick 3.3/1.9 TD 2.9/1.7 on NE 7  6/16: VT/NSVT. Started on IV lido + amio.    On Lido 0.5. Back on Amio overnight for increased ectopy. No VT. In NSR with PVCs this morning.  Coox 58%. Cr bumped, now improving. 2.34>2.05  Sitting up in bed. Friends at bedside. Cough improving, no shortness of breath. + mild orthopnea.   Objective:    Weight Range: 78.9 kg Body mass index is 33.97 kg/m.   Vital Signs:   Temp:  [97.8 F (36.6 C)-98.5 F (36.9 C)] 98.1 F (36.7 C) (06/18 1106) Pulse Rate:  [42-61] 60 (06/18 1000) Resp:  [11-33] 11 (06/18 0900) BP: (87-119)/(46-71) 111/70 (06/18 1000) SpO2:  [93 %-100 %] 96 % (06/18 1000) Weight:  [78.9 kg] 78.9 kg (06/18 0600) Last BM Date : 06/21/23  Weight change: Filed Weights   06/19/23 0822 06/22/23 0600  Weight: 72.8 kg 78.9 kg   Intake/Output:  Intake/Output Summary (Last 24 hours) at 06/22/2023 1123 Last data filed at 06/22/2023 1000 Gross per 24 hour  Intake 562.79 ml  Output 575 ml  Net -12.21 ml    Physical Exam    General: Elderly appearing. No distress on RA Cardiac: JVP ~7cm. S1 and S2 present. No murmurs or rub. Resp: Lung coarse Extremities: Warm and dry.  No peripheral edema.  Neuro: Alert and oriented x3. Affect pleasant.  Lines/Devices:  RIJ introducer  General: Elderly appearing Cardiac: JVP ~7cm. S1 and S2 present. No murmurs or rub. Extremities: Warm and dry.  No edema.  Neuro: Alert and oriented x3. Affect pleasant.   Lines/Devices:  RIJ CVL     Telemetry   SB in 50-60s with frequent PVCs starting around 0500. No VT (personally reviewed)  EKG    No new EKG to review  Labs    CBC Recent Labs    06/21/23 0312 06/22/23 0431  WBC 9.7 7.5  HGB 11.8* 9.7*  HCT 36.2 29.8*  MCV 106.2* 103.8*  PLT 223 172   Basic Metabolic Panel Recent Labs    16/10/96 1400 06/22/23 0431  NA 130* 123*  K 4.9 4.4  CL 94* 90*  CO2 25 25  GLUCOSE 137* 119*  BUN 40* 43*  CREATININE 2.34* 2.05*  CALCIUM 8.7* 7.8*  MG 2.6* 2.5*   ProBNP (last 3 results) Recent Labs    11/01/22 0845 11/09/22 0857  PROBNP 348 554   Fasting Lipid Panel Recent Labs    06/20/23 0139  CHOL 171  HDL 52  LDLCALC 108*  TRIG 54  CHOLHDL 3.3   Medications:    Scheduled Medications:  Chlorhexidine  Gluconate Cloth  6 each Topical Daily   clopidogrel   75 mg Oral Daily   levothyroxine   112 mcg Oral Q0600   lidocaine   1 patch Transdermal Q24H   mexiletine  150 mg Oral Q8H   pantoprazole   40 mg Oral Daily   rivaroxaban   15 mg Oral Q supper   sodium chloride  flush  3 mL Intravenous Q12H  Infusions:  amiodarone  30 mg/hr (06/22/23 1000)    PRN Medications: acetaminophen , ondansetron  (ZOFRAN ) IV, mouth rinse, oxyCODONE , sodium chloride  flush  Assessment/Plan   CAD with acute anterior STEMI - Cath 06/19/23: 99% pLAD with minimal flow distally s/p DES. Lcx/RCA ok EF 25%  - Xarelto  + Plavix . No ASA with DOAC - now with post MI VT - myalgias with statins   Acute Systolic Heart Failure - Ischemic CM: EF ~25% on cath (EF previously 55-60% in 11/23) - RHC 6/15: RA 10 PA 74/25 (45) PCW 25 Fick 3.3/1.9 TD 2.9/1.7 on NE 7 in cath lab   - Echo 6/16: EF 40-45%, G2DD, nl RV - Coox 58%. Off Milrinone - UOP picking up and Cr improving. Volume up by exam. Give IV Lasix  80 mg today - GDMT limited by soft BP - Stop digoxin bradycardia   VT/VF NSVT - 6/16 developed persistent, long runs of NSVT 5/16 on tele. Started on  Amio and Lidocaine . Lytes replaced - 6/17 overnight sustained VT/VF with lost pulse, received 1 round CPR + 300 mg amio bolus with ROSC and conversion to NSR. - improving today, no VT. However remains with PVCs - Keep K >4 and Mg >2; lytes okay this morning - Stop Lido; start Mexiletine 150 mg tid - continue amio 30/hr - may need LifeVest in short term at discharge pending rhythm and patient/family GOC  Pulmonary HTN - mixed picture (both pre and post-capillary) - at time of TAVR in 2018 PA 42/6 (21) PCWP 11 - HF treatment as above   H/o AS s/p TAVR 2018 - mild AI and AS. Stable valve. AVA 0.88cm2.    PAF - continue Xarelto  - continue amio - DAPT w ASA + Plavix  for now, plan to stop ASA at discharge with DOAC  AKI on CKD2 - b/l Cr 1.01 - in the setting of contrast and persistent ventricular arrhythmias resulting in brief CPR.  - Peaked at 2.34, urine picking up Cr down to 2.05 - diuresis as above - repeat BMET this afternoon  Length of Stay: 3  CRITICAL CARE Performed by: [place] Keagon Glascoe  Total critical care time: 11 minutes  Critical care time was exclusive of separately billable procedures and treating other patients.  Critical care was necessary to treat or prevent imminent or life-threatening deterioration.  Critical care was time spent personally by me on the following activities: development of treatment plan with patient and/or surrogate as well as nursing, discussions with consultants, evaluation of patient's response to treatment, examination of patient, obtaining history from patient or surrogate, ordering and performing treatments and interventions, ordering and review of laboratory studies, ordering and review of radiographic studies, pulse oximetry and re-evaluation of patient's condition.  [place] Shemica Meath, NP  06/22/2023, 11:23 AM  Advanced Heart Failure Team Pager [phone] (M-F; 7a - 5p) Amion for HF Team after hours.

## 2023-06-22 NOTE — Plan of Care (Signed)
   Problem: Education: Goal: Knowledge of General Education information will improve Description: Including pain rating scale, medication(s)/side effects and non-pharmacologic comfort measures Outcome: Progressing   Problem: Nutrition: Goal: Adequate nutrition will be maintained Outcome: Progressing   Problem: Coping: Goal: Level of anxiety will decrease Outcome: Progressing   Problem: Elimination: Goal: Will not experience complications related to urinary retention Outcome: Progressing   Problem: Pain Managment: Goal: General experience of comfort will improve and/or be controlled Outcome: Progressing   Problem: Safety: Goal: Ability to remain free from injury will improve Outcome: Progressing   Problem: Skin Integrity: Goal: Risk for impaired skin integrity will decrease Outcome: Progressing

## 2023-06-23 ENCOUNTER — Encounter (HOSPITAL_COMMUNITY): Payer: Self-pay | Admitting: Internal Medicine

## 2023-06-23 LAB — CBC
HCT: 29 % — ABNORMAL LOW (ref 36.0–46.0)
Hemoglobin: 9.5 g/dL — ABNORMAL LOW (ref 12.0–15.0)
MCH: 33.6 pg (ref 26.0–34.0)
MCHC: 32.8 g/dL (ref 30.0–36.0)
MCV: 102.5 fL — ABNORMAL HIGH (ref 80.0–100.0)
Platelets: 175 10*3/uL (ref 150–400)
RBC: 2.83 MIL/uL — ABNORMAL LOW (ref 3.87–5.11)
RDW: 13.4 % (ref 11.5–15.5)
WBC: 5.3 10*3/uL (ref 4.0–10.5)
nRBC: 0 % (ref 0.0–0.2)

## 2023-06-23 LAB — BASIC METABOLIC PANEL WITH GFR
Anion gap: 9 (ref 5–15)
BUN: 38 mg/dL — ABNORMAL HIGH (ref 8–23)
CO2: 26 mmol/L (ref 22–32)
Calcium: 8.3 mg/dL — ABNORMAL LOW (ref 8.9–10.3)
Chloride: 93 mmol/L — ABNORMAL LOW (ref 98–111)
Creatinine, Ser: 1.62 mg/dL — ABNORMAL HIGH (ref 0.44–1.00)
GFR, Estimated: 30 mL/min — ABNORMAL LOW (ref >60)
Glucose, Bld: 102 mg/dL — ABNORMAL HIGH (ref 70–99)
Potassium: 3.6 mmol/L (ref 3.5–5.1)
Sodium: 128 mmol/L — ABNORMAL LOW (ref 135–145)

## 2023-06-23 LAB — COOXEMETRY PANEL
Carboxyhemoglobin: 2.4 % — ABNORMAL HIGH (ref 0.5–1.5)
Methemoglobin: 0.7 % (ref 0.0–1.5)
O2 Saturation: 64.2 %
Total hemoglobin: 10 g/dL — ABNORMAL LOW (ref 12.0–16.0)

## 2023-06-23 LAB — MAGNESIUM: Magnesium: 2.3 mg/dL (ref 1.7–2.4)

## 2023-06-23 MED ORDER — ALUM & MAG HYDROXIDE-SIMETH 200-200-20 MG/5ML PO SUSP
30.0000 mL | ORAL | Status: DC | PRN
  Administered 2023-06-23: 30 mL via ORAL
  Filled 2023-06-23: qty 30

## 2023-06-23 MED ORDER — AMIODARONE HCL 200 MG PO TABS: 400.0000 mg | ORAL_TABLET | Freq: Two times a day (BID) | ORAL | Status: DC

## 2023-06-23 MED ORDER — FUROSEMIDE 40 MG PO TABS
40.0000 mg | ORAL_TABLET | Freq: Every day | ORAL | Status: DC
  Administered 2023-06-23 – 2023-06-28 (×6): 40 mg via ORAL
  Filled 2023-06-23 (×6): qty 1

## 2023-06-23 MED ORDER — AMIODARONE HCL 200 MG PO TABS
400.0000 mg | ORAL_TABLET | Freq: Two times a day (BID) | ORAL | Status: DC
  Administered 2023-06-23 – 2023-06-28 (×11): 400 mg via ORAL
  Filled 2023-06-23 (×11): qty 2

## 2023-06-23 MED ORDER — AMIODARONE HCL 200 MG PO TABS: 200.0000 mg | ORAL_TABLET | Freq: Every day | ORAL | Status: DC

## 2023-06-23 MED ORDER — AMIODARONE HCL 200 MG PO TABS: 400.0000 mg | ORAL_TABLET | Freq: Every day | ORAL | Status: DC

## 2023-06-23 NOTE — Plan of Care (Signed)

## 2023-06-23 NOTE — Discharge Summary (Incomplete)
 Advanced Heart Failure Team  Discharge Summary   Patient ID: [name] MRN: [id number], DOB/AGE: 07-01-1931 89 y.o. Admit date: 06/19/2023 D/C date:     06/28/2023   Primary Discharge Diagnoses:  Acute anterolateral STEMI Cardiogenic shock VT/VF  Secondary Discharge Diagnoses:  Acute systolic CHF ICM CAD AS s/p TAVR PAF Pulmonary hypertension  Hospital Course:   89 y.o. female with history of PAF, SVT, severe AS s/p TAVR with 26 mm S3 in 2018, HFpEF.  Patient was admitted 06/19/23 with anterolateral STEMI. Emergent cath demonstrated 99% p LAD treated with DES. EF 25% in lab. Developed cardiogenic shock and Swan left in place after RHC. She required norepinephrine  and milrinone  support. Echo with LVEF 40-45%, WMA in LAD territory.   She later had salvos of VT/VF and was started treated with IV amiodarone  and IV lidocaine . Despite this she subsequently had sustained VT/VF with loss of pulse s/p brief CPR. She was eventually transitioned to po amiodarone  and mexiletine, without recurrent VT.   Inotrope weaned off and she was diuresed. GDMT titrated, limited by soft BP.   Close follow-up in HF clinic arranged.  Discharge Physical Exam: General: Well appearing. No distress on RA Cardiac: JVP flat. S1 and S2 present. No murmurs or rub. Extremities: Warm and dry.  No edema.  Neuro: Alert and oriented x3. Affect pleasant. Moves all extremities without difficulty.  Discharge Weight Range: 150 lbs Discharge Vitals: Blood pressure 106/62, pulse (!) 56, temperature 97.8 F (36.6 C), temperature source Oral, resp. rate 16, height 5' 3 (1.6 m), weight 68.1 kg, SpO2 92%.  Labs: Lab Results  Component Value Date   WBC 6.8 06/28/2023   HGB 9.3 (L) 06/28/2023   HCT 28.4 (L) 06/28/2023   MCV 104.4 (H) 06/28/2023   PLT 292 06/28/2023    Recent Labs  Lab 06/28/23 0257  NA 133*  K 4.1  CL 98  CO2 23  BUN 37*  CREATININE 1.71*  CALCIUM 8.7*  GLUCOSE 102*   Lab Results   Component Value Date   CHOL 171 06/20/2023   HDL 52 06/20/2023   LDLCALC 108 (H) 06/20/2023   TRIG 54 06/20/2023   BNP (last 3 results) No results for input(s): BNP in the last 8760 hours.  ProBNP (last 3 results) Recent Labs    11/01/22 0845 11/09/22 0857  PROBNP 348 554   Diagnostic Studies/Procedures   No results found.  Discharge Medications   Allergies as of 06/28/2023       Reactions   Compazine [prochlorperazine] Other (See Comments)   Unknown, pt does not remember   Fenofibrate Rash   Floxin [ofloxacin] Other (See Comments)   Unknown, pt does not remember reaction but was allergic reaction   Statins Other (See Comments)   Myalgia - Atorvastatin, simvastatin, pitavastatin        Medication List     STOP taking these medications    CALCIUM 600/VITAMIN D  PO       TAKE these medications    acetaminophen  500 MG tablet Commonly known as: TYLENOL  Take 2 tablets (1,000 mg total) by mouth every 6 (six) hours as needed.   amiodarone  400 MG tablet Commonly known as: PACERONE  Take 2 tablets (400 mg) twice daily through Wednesday 07/06/23.  On Thursday 07/07/23, start taking 400 mg daily. What changed:  medication strength how much to take how to take this when to take this additional instructions   ascorbic acid  500 MG tablet Commonly known as: VITAMIN C  Take 500 mg  by mouth daily.   clopidogrel  75 MG tablet Commonly known as: PLAVIX  Take 1 tablet (75 mg total) by mouth daily.   furosemide  40 MG tablet Commonly known as: LASIX  Take 1 tablet (40 mg total) by mouth daily.   levothyroxine  112 MCG tablet Commonly known as: SYNTHROID  Take 112 mcg by mouth daily before breakfast.   lidocaine  5 % Commonly known as: LIDODERM  Place 1 patch onto the skin daily. Remove & Discard patch within 12 hours or as directed by MD   Magnesium  400 MG Caps Take 400 mg by mouth in the morning and at bedtime.   mexiletine 150 MG capsule Commonly known as:  MEXITIL  Take 1 capsule (150 mg total) by mouth every 8 (eight) hours.   pantoprazole  40 MG tablet Commonly known as: PROTONIX  Take 1 tablet (40 mg total) by mouth daily.   potassium chloride  SA 20 MEQ tablet Commonly known as: KLOR-CON  M Take 1 tablet (20 mEq total) by mouth daily. What changed:  medication strength how much to take how to take this when to take this additional instructions   spironolactone  25 MG tablet Commonly known as: ALDACTONE  Take 1 tablet (25 mg total) by mouth daily.   Xarelto  15 MG Tabs tablet Generic drug: Rivaroxaban  TAKE 1 TABLET(15 MG) BY MOUTH DAILY WITH SUPPER What changed: See the new instructions.        Disposition   The patient will be discharged in stable condition to home. Discharge Instructions     (HEART FAILURE PATIENTS) Call MD:  Anytime you have any of the following symptoms: 1) 3 pound weight gain in 24 hours or 5 pounds in 1 week 2) shortness of breath, with or without a dry hacking cough 3) swelling in the hands, feet or stomach 4) if you have to sleep on extra pillows at night in order to breathe.   Complete by: As directed    Amb Referral to Cardiac Rehabilitation   Complete by: As directed    Diagnosis:  STEMI Coronary Stents     After initial evaluation and assessments completed: Virtual Based Care may be provided alone or in conjunction with Phase 2 Cardiac Rehab based on patient barriers.: Yes   Intensive Cardiac Rehabilitation (ICR) [facility] location only OR Traditional Cardiac Rehabilitation (TCR) *If criteria for ICR are not met will enroll in TCR [facility] only): Yes   Diet - low sodium heart healthy   Complete by: As directed    Heart Failure patients record your daily weight using the same scale at the same time of day   Complete by: As directed    Increase activity slowly   Complete by: As directed        Contact information for follow-up providers     [facility] Follow  up on 07/04/2023.   Specialty: Cardiology Why: at 11:30a [facility], Entrance C Free Valet Parking available Contact information: [address] [place]  [address] [phone]             Contact information for after-discharge care     Destination     [facility], [place] .   Service: Skilled Nursing Contact information: [address] [place]  [phone]                      Duration of Discharge Encounter: 20 minutes  [place] Lee, NP 06/28/2023, 1:37 PM

## 2023-06-23 NOTE — TOC Progression Note (Addendum)
 Transition of Care [facility]) - Progression Note    Patient Details  Name: ALAIRA LEVEL MRN: [id number] Date of Birth: 02-20-1931  [facility]) CM/SW Contact  Benjiman Bras, RN Phone Number: [phone] 06/23/2023, 3:52 PM  Clinical Narrative:     Received confirmation from [employer] rep, Myrtie Atkinson. Pt was approved for Life Vest. Will be fitted today.   [facility] for Home Health. Will need HHRN, and PT orders with F2F.   Expected Discharge Plan: Home w Home Health Services Barriers to Discharge: Continued Medical Work up  Expected Discharge Plan and Services   Discharge Planning Services: CM Consult Post Acute Care Choice: Home Health Living arrangements for the past 2 months: Single Family Home                                       Social Determinants of Health (SDOH) Interventions SDOH Screenings   Food Insecurity: No Food Insecurity (06/20/2023)  Housing: Low Risk  (06/20/2023)  Transportation Needs: No Transportation Needs (06/20/2023)  Utilities: Not At Risk (06/20/2023)  Social Connections: Moderately Isolated (06/20/2023)  Tobacco Use: Low Risk  (06/23/2023)    Readmission Risk Interventions     No data to display

## 2023-06-23 NOTE — Plan of Care (Signed)
  Problem: Education: Goal: Knowledge of General Education information will improve Description: Including pain rating scale, medication(s)/side effects and non-pharmacologic comfort measures Outcome: Progressing   Problem: Health Behavior/Discharge Planning: Goal: Ability to manage health-related needs will improve Outcome: Progressing   Problem: Clinical Measurements: Goal: Ability to maintain clinical measurements within normal limits will improve Outcome: Progressing Goal: Will remain free from infection Outcome: Progressing Goal: Diagnostic test results will improve Outcome: Progressing   Problem: Nutrition: Goal: Adequate nutrition will be maintained Outcome: Progressing   Problem: Coping: Goal: Level of anxiety will decrease Outcome: Progressing   Problem: Elimination: Goal: Will not experience complications related to urinary retention Outcome: Progressing   Problem: Activity: Goal: Risk for activity intolerance will decrease Outcome: Not Progressing

## 2023-06-23 NOTE — TOC CM/SW Note (Signed)
 06-23-23 1057 Case Manager received a call from the Life Vest Rep that this patient will need a Life Vest @ discharge. Clinicals submitted to [employer] for [employer]. Will await insurance authorization and then the patient will need to be educated and fit. No further needs identified at this time.

## 2023-06-23 NOTE — Progress Notes (Signed)
 Advanced Heart Failure Rounding Note  Cardiologist: Antoinette Batman, MD  [facility] Cardiologist: Dan Bensimhon, MD Chief Complaint: VT/VF Patient Profile   [name] is a 89 y.o. female with 89 y.o. female with hyperlipidemia, SVT, PAF, severe AS s/p advert SAPIEN 3 bioprosthetic TAVR 2018, mild nonobstructive CAD previously.   Subjective:    Co-ox 64%. Stable rhythm overnight. On amio 30/hr.  Net - 2L with IV Lasix . Cr 1.79>1.62  Sitting up in bed, feeling well. Ready to be OOB. CP improved.   Objective:    Weight Range: 75.3 kg Body mass index is 32.42 kg/m.   Vital Signs:   Temp:  [97.8 F (36.6 C)-98.7 F (37.1 C)] 98.4 F (36.9 C) (06/19 0321) Pulse Rate:  [53-65] 60 (06/19 0700) Resp:  [8-33] 14 (06/19 0700) BP: (86-119)/(48-73) 116/55 (06/19 0700) SpO2:  [94 %-99 %] 95 % (06/19 0700) Weight:  [75.3 kg] 75.3 kg (06/19 0500) Last BM Date : 06/21/23  Weight change: Filed Weights   06/19/23 0822 06/22/23 0600 06/23/23 0500  Weight: 72.8 kg 78.9 kg 75.3 kg   Intake/Output:  Intake/Output Summary (Last 24 hours) at 06/23/2023 0725 Last data filed at 06/23/2023 0530 Gross per 24 hour  Intake 449.51 ml  Output 2475 ml  Net -2025.49 ml    Physical Exam    General: Elderly Cardiac: JVP flat. S1 and S2 present. No murmurs or rub. Extremities: Warm and dry.  No edema.  Neuro: Alert and oriented x3. Affect pleasant. Moves all extremities without difficulty. Lines/Devices:  RIJ CVL    Telemetry   SR 50-60s, occasionally PVCs (personally reviewed)  EKG    No new EKG to review  Labs    CBC Recent Labs    06/22/23 0431 06/23/23 0422  WBC 7.5 5.3  HGB 9.7* 9.5*  HCT 29.8* 29.0*  MCV 103.8* 102.5*  PLT 172 175   Basic Metabolic Panel Recent Labs    44/03/47 0431 06/22/23 1411 06/23/23 0422  NA 123* 126* 128*  K 4.4 3.7 3.6  CL 90* 87* 93*  CO2 25 23 26   GLUCOSE 119* 126* 102*  BUN 43* 41* 38*  CREATININE 2.05* 1.79* 1.62*  CALCIUM 7.8*  8.4* 8.3*  MG 2.5*  --  2.3   ProBNP (last 3 results) Recent Labs    11/01/22 0845 11/09/22 0857  PROBNP 348 554   Fasting Lipid Panel No results for input(s): CHOL, HDL, LDLCALC, TRIG, CHOLHDL, LDLDIRECT in the last 72 hours.  Medications:    Scheduled Medications:  Chlorhexidine  Gluconate Cloth  6 each Topical Daily   clopidogrel   75 mg Oral Daily   levothyroxine   112 mcg Oral Q0600   lidocaine   1 patch Transdermal Q24H   mexiletine  150 mg Oral Q8H   pantoprazole   40 mg Oral Daily   rivaroxaban   15 mg Oral Q supper   sodium chloride  flush  3 mL Intravenous Q12H    Infusions:  amiodarone  30 mg/hr (06/23/23 0500)    PRN Medications: acetaminophen , ondansetron  (ZOFRAN ) IV, mouth rinse, oxyCODONE , sodium chloride  flush  Assessment/Plan   CAD with acute anterior STEMI - Cath 06/19/23: 99% pLAD with minimal flow distally s/p DES. Lcx/RCA ok EF 25%  - Xarelto  + Plavix . No ASA with DOAC - now with post MI VT - myalgias with statins   Acute Systolic Heart Failure - Ischemic CM: EF ~25% on cath (EF previously 55-60% in 11/23) - RHC 6/15: RA 10 PA 74/25 (45) PCW 25 Fick 3.3/1.9 TD  2.9/1.7 on NE 7 in cath lab   - Echo 6/16: EF 40-45%, G2DD, nl RV - Coox 64%. Stable. Creatinine improving. - GDMT limited by soft BP - Stop digoxin bradycardia - Start Lasix  40 mg daily   VT/VF NSVT - 6/16 developed persistent, long runs of NSVT 5/16 on tele. Started on Amio and Lidocaine . Lytes replaced - 6/17 overnight sustained VT/VF with lost pulse, received 1 round CPR + 300 mg amio bolus with ROSC and conversion to NSR. - improving, no VT. Remains with PVCs - Keep K >4 and Mg >2; lytes okay this morning - continue mexiletine 150 mg tid - stop IV amio; start amio 400 mg bid for 2wks, then 400 gm daily.  - will need temporary LifeVest at discharge given brief VT arrest  Pulmonary HTN - mixed picture (both pre and post-capillary) - at time of TAVR in 2018 PA 42/6 (21)  PCWP 11 - HF treatment as above   H/o AS s/p TAVR 2018 - mild AI and AS. Stable valve. AVA 0.88cm2.    PAF - continue Xarelto  - continue amio  AKI on CKD2 - b/l Cr 1.01 - in the setting of contrast and persistent ventricular arrhythmias resulting in brief CPR.  - improving. Cr 1.62 today.  - daily lasix  as above  Length of Stay: 4  [place] Konni Kesinger, NP  06/23/2023, 7:25 AM  Advanced Heart Failure Team Pager [phone] (M-F; 7a - 5p) Amion for HF Team after hours.

## 2023-06-24 LAB — CBC
HCT: 30.7 % — ABNORMAL LOW (ref 36.0–46.0)
Hemoglobin: 10.4 g/dL — ABNORMAL LOW (ref 12.0–15.0)
MCH: 34.6 pg — ABNORMAL HIGH (ref 26.0–34.0)
MCHC: 33.9 g/dL (ref 30.0–36.0)
MCV: 102 fL — ABNORMAL HIGH (ref 80.0–100.0)
Platelets: 209 10*3/uL (ref 150–400)
RBC: 3.01 MIL/uL — ABNORMAL LOW (ref 3.87–5.11)
RDW: 13.2 % (ref 11.5–15.5)
WBC: 5.7 10*3/uL (ref 4.0–10.5)
nRBC: 0 % (ref 0.0–0.2)

## 2023-06-24 LAB — BASIC METABOLIC PANEL WITH GFR
Anion gap: 10 (ref 5–15)
BUN: 39 mg/dL — ABNORMAL HIGH (ref 8–23)
CO2: 26 mmol/L (ref 22–32)
Calcium: 8.4 mg/dL — ABNORMAL LOW (ref 8.9–10.3)
Chloride: 94 mmol/L — ABNORMAL LOW (ref 98–111)
Creatinine, Ser: 1.47 mg/dL — ABNORMAL HIGH (ref 0.44–1.00)
GFR, Estimated: 34 mL/min — ABNORMAL LOW (ref >60)
Glucose, Bld: 106 mg/dL — ABNORMAL HIGH (ref 70–99)
Potassium: 3.8 mmol/L (ref 3.5–5.1)
Sodium: 130 mmol/L — ABNORMAL LOW (ref 135–145)

## 2023-06-24 LAB — MAGNESIUM: Magnesium: 2.3 mg/dL (ref 1.7–2.4)

## 2023-06-24 MED ORDER — POTASSIUM CHLORIDE CRYS ER 20 MEQ PO TBCR
40.0000 meq | EXTENDED_RELEASE_TABLET | Freq: Once | ORAL | Status: AC
  Administered 2023-06-24: 40 meq via ORAL
  Filled 2023-06-24: qty 2

## 2023-06-24 MED ORDER — GUAIFENESIN-DM 100-10 MG/5ML PO SYRP
5.0000 mL | ORAL_SOLUTION | ORAL | Status: DC | PRN
  Administered 2023-06-24 – 2023-06-27 (×3): 5 mL via ORAL
  Filled 2023-06-24 (×3): qty 5

## 2023-06-24 MED ORDER — POLYETHYLENE GLYCOL 3350 17 G PO PACK
17.0000 g | PACK | Freq: Every day | ORAL | Status: DC
  Administered 2023-06-24: 17 g via ORAL
  Filled 2023-06-24 (×4): qty 1

## 2023-06-24 MED ORDER — POLYETHYLENE GLYCOL 3350 17 G PO PACK: 17.0000 g | PACK | Freq: Every day | ORAL | Status: DC

## 2023-06-24 MED ORDER — FUROSEMIDE 40 MG PO TABS
40.0000 mg | ORAL_TABLET | Freq: Once | ORAL | Status: AC
  Administered 2023-06-24: 40 mg via ORAL
  Filled 2023-06-24: qty 1

## 2023-06-24 MED ORDER — MAGNESIUM HYDROXIDE 400 MG/5ML PO SUSP: 30.0000 mL | Freq: Every day | ORAL | Status: DC | PRN

## 2023-06-24 NOTE — Progress Notes (Signed)
 Advanced Heart Failure Rounding Note  Cardiologist: Antoinette Batman, MD  AHF Cardiologist: Dan Bensimhon, MD Chief Complaint: VT/VF Patient Profile   [name] is a 89 y.o. female with 89 y.o. female with hyperlipidemia, SVT, PAF, severe AS s/p advert SAPIEN 3 bioprosthetic TAVR 2018, mild nonobstructive CAD previously.   Subjective:    Sodium improving as well as creatinine, off oxygen but needing with exertion. Still mildly volume up, continue oral diuresis, extra dose today. Declined lifevest, reasonable after risk benefit discussion  Objective:    Weight Range: 74 kg Body mass index is 28.9 kg/m.   Vital Signs:   Temp:  [97.6 F (36.4 C)-98.5 F (36.9 C)] 97.6 F (36.4 C) (06/20 1130) Pulse Rate:  [55-74] 63 (06/20 1130) Resp:  [17-38] 20 (06/20 1130) BP: (93-139)/(50-78) 110/55 (06/20 1130) SpO2:  [93 %-98 %] 94 % (06/20 1130) Weight:  [74 kg] 74 kg (06/20 0201) Last BM Date : 06/21/23  Weight change: Filed Weights   06/22/23 0600 06/23/23 0500 06/24/23 0201  Weight: 78.9 kg 75.3 kg 74 kg   Intake/Output:  Intake/Output Summary (Last 24 hours) at 06/24/2023 1429 Last data filed at 06/24/2023 1100 Gross per 24 hour  Intake 320 ml  Output 1500 ml  Net -1180 ml    Physical Exam    GENERAL: NAD, elderly appearing PULM:  Normal work of breathing, mild crackles CARDIAC:  JVP: flat         Normal rate with regular rhythm. No murmurs, rubs or gallops.  Trace edema. Warm and well perfused extremities. ABDOMEN: Soft, non-tender, non-distended. NEUROLOGIC: Patient is oriented x3 with no focal or lateralizing neurologic deficits.      Telemetry   SR 50-60s, occasionally PVCs (personally reviewed)  EKG    No new EKG to review  Labs    CBC Recent Labs    06/23/23 0422 06/24/23 0301  WBC 5.3 5.7  HGB 9.5* 10.4*  HCT 29.0* 30.7*  MCV 102.5* 102.0*  PLT 175 209   Basic Metabolic Panel Recent Labs    16/10/96 0422 06/24/23 0301  NA 128*  130*  K 3.6 3.8  CL 93* 94*  CO2 26 26  GLUCOSE 102* 106*  BUN 38* 39*  CREATININE 1.62* 1.47*  CALCIUM 8.3* 8.4*  MG 2.3 2.3   ProBNP (last 3 results) Recent Labs    11/01/22 0845 11/09/22 0857  PROBNP 348 554   Fasting Lipid Panel No results for input(s): CHOL, HDL, LDLCALC, TRIG, CHOLHDL, LDLDIRECT in the last 72 hours.  Medications:    Scheduled Medications:  amiodarone   400 mg Oral BID   Followed by   Cecily Cohen ON 07/07/2023] amiodarone   400 mg Oral Daily   clopidogrel   75 mg Oral Daily   furosemide   40 mg Oral Daily   furosemide   40 mg Oral Once   levothyroxine   112 mcg Oral Q0600   lidocaine   1 patch Transdermal Q24H   mexiletine  150 mg Oral Q8H   pantoprazole   40 mg Oral Daily   rivaroxaban   15 mg Oral Q supper   sodium chloride  flush  3 mL Intravenous Q12H    Infusions:    PRN Medications: acetaminophen , alum & mag hydroxide-simeth, ondansetron  (ZOFRAN ) IV, mouth rinse, oxyCODONE , sodium chloride  flush  Assessment/Plan   CAD with acute anterior STEMI - Cath 06/19/23: 99% pLAD with minimal flow distally s/p DES. Lcx/RCA ok EF 25%  - Xarelto  + Plavix . No ASA with DOAC - now with post MI VT,  due to reperfusion, improved - myalgias with statins   Acute Systolic Heart Failure - Ischemic CM: EF ~25% on cath (EF previously 55-60% in 11/23) - RHC 6/15: RA 10 PA 74/25 (45) PCW 25 Fick 3.3/1.9 TD 2.9/1.7 on NE 7 in cath lab   - Echo 6/16: EF 40-45%, G2DD, nl RV - Introducer removed, no inotropes with recent VT/VF - GDMT limited by soft BP - Continue Lasix  40 mg daily   VT/VF NSVT - 6/16 developed persistent, long runs of NSVT 5/16 on tele. Started on Amio and Lidocaine . Lytes replaced - 6/17 overnight sustained VT/VF with lost pulse, received 1 round CPR + 300 mg amio bolus with ROSC and conversion to NSR. - improving, no VT. Remains with occasional PVCs - Keep K >4 and Mg >2; lytes okay this morning - continue mexiletine 150 mg tid - Conitnue  amio 400 mg bid for 2wks, then 400 gm daily.  - Discussed LifeVest, will defer at this time  Pulmonary HTN - mixed picture (both pre and post-capillary) - HF treatment as above   H/o AS s/p TAVR 2018 - mild AI and AS. Stable valve. AVA 0.88cm2.    PAF - continue Xarelto  - continue amio  AKI on CKD2 - b/l Cr 1.01 - in the setting of contrast and persistent ventricular arrhythmias resulting in brief CPR.  - improving. Cr 1.47 - daily lasix  as above  Length of Stay: 5  Lauralee Poll, MD  06/24/2023, 2:29 PM  Advanced Heart Failure Team Pager [phone] (M-F; 7a - 5p) Amion for HF Team after hours.

## 2023-06-24 NOTE — Progress Notes (Signed)
 CARDIAC REHAB PHASE I     Post MI/stent education including restrictions, risk factors, exercise guidelines, antiplatelet therapy importance, MI booklet, NTG use, heart healthy diet and CRP2 reviewed. All questions and concerns addressed. Will refer to [facility] for CRP2. PT/OT and mobility team addressing complex mobility needs.   [id number] Ronny Colas, RN BSN 06/24/2023 9:11 AM

## 2023-06-24 NOTE — Evaluation (Signed)
 Physical Therapy Evaluation Patient Details Name: [name] MRN: [id number] DOB: 1931/11/05 Today's Date: 06/24/2023  History of Present Illness  Patient is an 89 y/o female admitted 06/19/23 with chest pain found to have STEMI and underwent cath found to have 99% pLAD s/p DES on 6/15, developed long runs of NSVT 6/16 started on Amio and sustained VT/VF lost pulse overnight 6/17 had 1 round CPR and 300mg  amio bolus with ROSC and conversion to NSR.  Work up for [employer] ongoing. PMH: hyperlipidemia, SVT, PAF, severe AS s/p advert SAPIEN 3 bioprosthetic TAVR 2018, mild nonobstructive CAD.  Clinical Impression  Patient presents with decreased mobility due to pain in chest, decreased standing balance, generalized weakness, decreased safety awareness and high risk for fall even assisted.  Normally lives alone and manages getting around with her lift chair and rollator walker.  States family planning to hire help at home.  Currently needing +2 A for safety for ambulation to bathroom with L lateral and posterior lean and bilat LE weakness (buckling/shaky).  Feel she will benefit from skilled PT in the acute setting and from post-acute inpatient rehab (<3 hours/day) prior to return home with assist.      If plan is discharge home, recommend the following: A lot of help with bathing/dressing/bathroom;A lot of help with walking and/or transfers;Assist for transportation;Assistance with cooking/housework   Can travel by private vehicle   No    Equipment Recommendations None recommended by PT  Recommendations for Other Services       Functional Status Assessment Patient has had a recent decline in their functional status and demonstrates the ability to make significant improvements in function in a reasonable and predictable amount of time.     Precautions / Restrictions Precautions Precautions: Fall Recall of Precautions/Restrictions: Intact Precaution/Restrictions Comments: watch HR       Mobility  Bed Mobility               General bed mobility comments: up in recliner    Transfers Overall transfer level: Needs assistance Equipment used: Rolling walker (2 wheels) Transfers: Sit to/from Stand Sit to Stand: Mod assist, +2 physical assistance           General transfer comment: vc for sequencing' trying to pull up on rollator and lifting help to stand    Ambulation/Gait Ambulation/Gait assistance: Mod assist, +2 physical assistance Gait Distance (Feet): 12 Feet (x 2) Assistive device: Rolling walker (2 wheels) Gait Pattern/deviations: Step-to pattern, Step-through pattern, Leaning posteriorly, Shuffle, Knee flexed in stance - left       General Gait Details: leaning L and posteriorly throughout, assist for balance, cues for posture  Stairs            Wheelchair Mobility     Tilt Bed    Modified Rankin (Stroke Patients Only)       Balance Overall balance assessment: Needs assistance   Sitting balance-Leahy Scale: Fair Sitting balance - Comments: mild L lateral lean though able to reach to feet to adjust socks   Standing balance support: Bilateral upper extremity supported Standing balance-Leahy Scale: Poor Standing balance comment: mod A for standing with UE support                             Pertinent Vitals/Pain Pain Assessment Pain Assessment: Faces Faces Pain Scale: Hurts a little bit Pain Location: chest from CPR Pain Descriptors / Indicators: Sore Pain Intervention(s): Limited activity within patient's  tolerance    Home Living Family/patient expects to be discharged to:: Private residence Living Arrangements: Alone Available Help at Discharge: Family;Personal care attendant;Available 24 hours/day Type of Home: House Home Access: Stairs to enter   [employer] of Steps: from  garage level entry   Home Layout: One level Home Equipment: Agricultural consultant (2 wheels);Rollator (4 wheels);Grab bars -  toilet;Grab bars - tub/shower;Shower seat - built in;Hand held Passenger transport manager      Prior Function Prior Level of Function : Independent/Modified Independent;Driving             Mobility Comments: uses a rollator around the house and out to [employer]; uses a cane out in the community; friends help her stand up becuase of a bad Left knee ADLs Comments: Independent with all IADL tasks with the exception of house cleaning - hires someone to clean her house; uses a lift chair     Extremity/Trunk Assessment   Upper Extremity Assessment Upper Extremity Assessment: Defer to OT evaluation    Lower Extremity Assessment Lower Extremity Assessment: RLE deficits/detail;LLE deficits/detail RLE Deficits / Details: AROM grossly WFL, strength 4+/5 LLE Deficits / Details: AROM WFL, reports pain in knee and note arthritc changes; strength grossly 4-/5 knee extension and hip flexion    Cervical / Trunk Assessment Cervical / Trunk Assessment: Kyphotic  Communication   Communication Communication: Impaired Factors Affecting Communication: Hearing impaired    Cognition Arousal: Alert Behavior During Therapy: WFL for tasks assessed/performed                             Following commands: Intact       Cueing Cueing Techniques: Verbal cues     General Comments General comments (skin integrity, edema, etc.): VSS on RA though SpO2 probe reading 85% at times, with portable O2 monitor reading 92%, though pt denotes SOB with activity; noted feet with bluish tint after ambulation and prominent veins pt asking why her feet look blue, improved with elevation    Exercises     Assessment/Plan    PT Assessment Patient needs continued PT services  PT Problem List Decreased strength;Decreased activity tolerance;Decreased balance;Decreased mobility;Cardiopulmonary status limiting activity;Decreased safety awareness       PT Treatment Interventions DME instruction;Gait  training;Functional mobility training;Therapeutic activities;Therapeutic exercise;Balance training;Patient/family education    PT Goals (Current goals can be found in the Care Plan section)  Acute Rehab PT Goals Patient Stated Goal: get stronger PT Goal Formulation: With patient Time For Goal Achievement: 07/08/23 Potential to Achieve Goals: Fair    Frequency Min 2X/week     Co-evaluation   Reason for Co-Treatment: To address functional/ADL transfers;For patient/therapist safety   OT goals addressed during session: ADL's and self-care       AM-PAC PT 6 Clicks Mobility  Outcome Measure Help needed turning from your back to your side while in a flat bed without using bedrails?: A Lot Help needed moving from lying on your back to sitting on the side of a flat bed without using bedrails?: A Lot Help needed moving to and from a bed to a chair (including a wheelchair)?: A Lot Help needed standing up from a chair using your arms (e.g., wheelchair or bedside chair)?: Total Help needed to walk in hospital room?: Total Help needed climbing 3-5 steps with a railing? : Total 6 Click Score: 9    End of Session Equipment Utilized During Treatment: Gait belt Activity Tolerance: Patient limited  by fatigue Patient left: in chair;with call bell/phone within reach   PT Visit Diagnosis: Muscle weakness (generalized) (M62.81);Other abnormalities of gait and mobility (R26.89)    Time: 0981-1914 PT Time Calculation (min) (ACUTE ONLY): 35 min   Charges:   PT Evaluation $PT Eval Moderate Complexity: 1 Mod   PT General Charges $$ ACUTE PT VISIT: 1 Visit         Abigail Hoff, PT Acute Rehabilitation Services Office:[phone] 06/24/2023   Marley Simmers 06/24/2023, 2:33 PM

## 2023-06-24 NOTE — Plan of Care (Signed)

## 2023-06-24 NOTE — NC FL2 (Signed)
 [place]  MEDICAID FL2 LEVEL OF CARE FORM     IDENTIFICATION  Patient Name: [name] Birthdate: 1931/08/19 Sex: female Admission Date (Current Location): 06/19/2023  [facility] and [place] Number:  Producer, television/film/video and Address:  [facility] [place], [address], [place], [place] [address]      Provider Number: [id number]  Attending Physician Name and Address:  Lauralee Poll, MD  Relative Name and Phone Number:  Kathrin Pares Daughter   [phone]    Current Level of Care: Hospital Recommended Level of Care: Skilled Nursing Facility Prior Approval Number:    Date Approved/Denied:   PASRR Number: [id number]  Discharge Plan: SNF    Current Diagnoses: Patient Active Problem List   Diagnosis Date Noted   Acute ST elevation myocardial infarction (STEMI) due to occlusion of left anterior descending (LAD) coronary artery (HCC) 06/19/2023   Encounter for monitoring amiodarone  therapy 10/06/2022   Persistent atrial fibrillation (HCC) 09/08/2022   PAF (paroxysmal atrial fibrillation) (HCC) 08/24/2022   Hypercoagulable state due to persistent atrial fibrillation (HCC) 08/24/2022   Atrial fibrillation with rapid ventricular response (HCC) 12/30/2021   Atrial fibrillation with RVR (HCC) 12/30/2021   Chronic kidney disease, stage 3 unspecified (HCC) 07/22/2021   Drug-induced myopathy 07/22/2021   History of aortic valve replacement 07/22/2021   History of peptic ulcer 07/22/2021   Osteoporosis 07/22/2021   Personal history of malignant neoplasm of breast 07/22/2021   Rheumatic aortic stenosis with regurgitation 07/22/2021   Senile osteopenia 07/22/2021   Vitamin D  deficiency 07/22/2021   History of total knee replacement, right 11/04/2019   Pain in joint of left knee 11/04/2019   Osteoarthritis of left knee 11/04/2019   Carotid artery disease (HCC)    ETD (Eustachian tube dysfunction), bilateral 05/10/2017   Seasonal allergic rhinitis  05/10/2017   Aortic stenosis, severe    Rheumatic fever    CAD (coronary artery disease), native coronary artery 10/20/2015   Weakness 05/08/2015   Breast cancer, right breast (HCC)    PUD (peptic ulcer disease)    Hypothyroidism    Hypercholesteremia    Obese 08/29/2013   S/P right THA, AA 08/28/2013   Chronic diastolic heart failure (HCC) 02/13/2013   SVT (supraventricular tachycardia) (HCC) 10/24/2012    Orientation RESPIRATION BLADDER Height & Weight     Self, Time, Situation, Place  Normal Continent Weight: 163 lb 2.3 oz (74 kg) (1pillow, 1 sheet, 1 blanket on bed at time of weight.) Height:  5' 3 (160 cm)  BEHAVIORAL SYMPTOMS/MOOD NEUROLOGICAL BOWEL NUTRITION STATUS      Continent Diet (See dc summary)  AMBULATORY STATUS COMMUNICATION OF NEEDS Skin   Extensive Assist Verbally Normal                       Personal Care Assistance Level of Assistance  Bathing, Feeding, Dressing Bathing Assistance: Maximum assistance Feeding assistance: Limited assistance Dressing Assistance: Maximum assistance     Functional Limitations Info  Sight, Hearing, Speech Sight Info: Impaired Hearing Info: Adequate Speech Info: Adequate    SPECIAL CARE FACTORS FREQUENCY  PT (By licensed PT), OT (By licensed OT)     PT Frequency: 5x weekly OT Frequency: 5x weekly            Contractures      Additional Factors Info  Code Status, Allergies Code Status Info: Full Allergies Info: Compazine (prochlorperazine);Fenofibrate;Floxin (ofloxacin);Statins           Current Medications (06/24/2023):  This is the current hospital active medication list Current Facility-Administered Medications  Medication Dose Route Frequency Provider Last Rate Last Admin   acetaminophen  (TYLENOL ) tablet 650 mg  650 mg Oral Q4H PRN Stoner, Benjamin J, MD   650 mg at 06/24/23 0656   alum & mag hydroxide-simeth (MAALOX/MYLANTA) 200-200-20 MG/5ML suspension 30 mL  30 mL Oral Q4H PRN Stoner, Benjamin  J, MD   30 mL at 06/23/23 0900   amiodarone  (PACERONE ) tablet 400 mg  400 mg Oral BID Stoner, Benjamin J, MD   400 mg at 06/24/23 1017   Followed by   Cecily Cohen ON 07/07/2023] amiodarone  (PACERONE ) tablet 400 mg  400 mg Oral Daily Stoner, Benjamin J, MD       clopidogrel  (PLAVIX ) tablet 75 mg  75 mg Oral Daily Bensimhon, Daniel R, MD   75 mg at 06/24/23 1017   furosemide  (LASIX ) tablet 40 mg  40 mg Oral Daily Stoner, Benjamin J, MD   40 mg at 06/24/23 1018   guaiFENesin -dextromethorphan  (ROBITUSSIN DM) 100-10 MG/5ML syrup 5 mL  5 mL Oral Q4H PRN Stoner, Benjamin J, MD   5 mL at 06/24/23 1538   levothyroxine  (SYNTHROID ) tablet 112 mcg  112 mcg Oral Q0600 Bensimhon, Daniel R, MD   112 mcg at 06/24/23 0653   lidocaine  (LIDODERM ) 5 % 1 patch  1 patch Transdermal Q24H Stoner, Benjamin J, MD   1 patch at 06/24/23 1018   mexiletine (MEXITIL) capsule 150 mg  150 mg Oral Q8H Stoner, Benjamin J, MD   150 mg at 06/24/23 1538   ondansetron  (ZOFRAN ) injection 4 mg  4 mg Intravenous Q6H PRN Bensimhon, Daniel R, MD       Oral care mouth rinse  15 mL Mouth Rinse PRN Patwardhan, Manish J, MD       oxyCODONE  (Oxy IR/ROXICODONE ) immediate release tablet 2.5 mg  2.5 mg Oral Q6H PRN Stoner, Benjamin J, MD   2.5 mg at 06/23/23 1932   pantoprazole  (PROTONIX ) EC tablet 40 mg  40 mg Oral Daily Stoner, Benjamin J, MD   40 mg at 06/24/23 1018   Rivaroxaban  (XARELTO ) tablet 15 mg  15 mg Oral Q supper Stoner, Benjamin J, MD   15 mg at 06/23/23 1609   sodium chloride  flush (NS) 0.9 % injection 3 mL  3 mL Intravenous Q12H Bensimhon, Rheta Celestine, MD   3 mL at 06/24/23 1018   sodium chloride  flush (NS) 0.9 % injection 3 mL  3 mL Intravenous PRN Bensimhon, Daniel R, MD         Discharge Medications: Please see discharge summary for a list of discharge medications.  Relevant Imaging Results:  Relevant Lab Results:   Additional Information SSN:[id number]  Murphy Arn, LCSWA

## 2023-06-24 NOTE — TOC Progression Note (Addendum)
 Transition of Care [facility]) - Progression Note    Patient Details  Name: [name] MRN: [id number] Date of Birth: 13-Jan-1931  Transition of Care [facility] CM/SW Contact  Ernst Heap Phone Number: [phone] 06/24/2023, 3:51 PM  Clinical Narrative:   HF CSW met with patient, patients daughter, and family friend at bedside. CSW met with patient and family to discuss SNF consult. Patient and family are agreeable to being faxed out. Patient and family stated that they would like [facility] as their #1 choice. CSW explained that no facility is guaranteed at the time of medically readiness. Family agreed and stated that they understand.   PLEASE contact daughter at [phone] regarding the patient.   CSW completed patients passr,  FL2 and faxed out.   WEEKEND: Please present bed offers to family as they would like to have time to review facilities before the patient is medically ready for dc. Per PA, at this time patient is not medically ready but potentially will be in a few days.   TOC will continue to follow.     Expected Discharge Plan: Home w Home Health Services Barriers to Discharge: Continued Medical Work up  Expected Discharge Plan and Services   Discharge Planning Services: CM Consult Post Acute Care Choice: Home Health Living arrangements for the past 2 months: Single Family Home                                       Social Determinants of Health (SDOH) Interventions SDOH Screenings   Food Insecurity: No Food Insecurity (06/20/2023)  Housing: Low Risk  (06/20/2023)  Transportation Needs: No Transportation Needs (06/20/2023)  Utilities: Not At Risk (06/20/2023)  Social Connections: Moderately Isolated (06/20/2023)  Tobacco Use: Low Risk  (06/23/2023)    Readmission Risk Interventions     No data to display

## 2023-06-24 NOTE — Evaluation (Signed)
 Occupational Therapy Evaluation Patient Details Name: [name] MRN: [id number] DOB: [birth year] Today's Date: 06/24/2023   History of Present Illness   Patient is an 89 y/o female admitted 06/19/23 with chest pain found to have STEMI and underwent cath found to have 99% pLAD s/p DES on 6/15, developed long runs of NSVT 6/16 started on Amio and sustained VT/VF lost pulse overnight 6/17 had 1 round CPR and 300mg  amio bolus with ROSC and conversion to NSR.  Work up for [employer] ongoing. PMH: hyperlipidemia, SVT, PAF, severe AS s/p advert SAPIEN 3 bioprosthetic TAVR 2018, mild nonobstructive CAD.     Clinical Impressions PTA pt lived alone independently, drove and used a rollator for household ambulation and her cane in the community. Pt demonstrates a significant functional decline, requiring Mod A +2 for mobility @ RW level and Max A with ADL tasks due to below listed deficits. Increased SOB with minimal activity and appears to desat to @ 85 on RA with quick rebound into the 90s. HR stable. At this time feel Patient will benefit from continued inpatient follow up therapy, <3 hours/day to maximize functional level of independence with goal of returning home. Discussed recommendations for post acute rehab with grand daughter via phone. Acute OT to follow.      If plan is discharge home, recommend the following:   Two people to help with walking and/or transfers;A lot of help with bathing/dressing/bathroom;Assistance with cooking/housework;Direct supervision/assist for medications management;Assist for transportation;Help with stairs or ramp for entrance     Functional Status Assessment   Patient has had a recent decline in their functional status and demonstrates the ability to make significant improvements in function in a reasonable and predictable amount of time.     Equipment Recommendations   None recommended by OT     Recommendations for Other Services          Precautions/Restrictions   Precautions Precautions: Fall Precaution/Restrictions Comments:  (watch HR)     Mobility Bed Mobility               General bed mobility comments: OOB in chair    Transfers Overall transfer level: Needs assistance   Transfers: Sit to/from Stand Sit to Stand: Mod assist, +2 physical assistance           General transfer comment: vc for sequencing; trying to pull up on rollator - unable to problem solve through the need to slide hips forward and push up      Balance Overall balance assessment: Needs assistance   Sitting balance-Leahy Scale: Fair Sitting balance - Comments: L lat lean     Standing balance-Leahy Scale: Poor - L lat and posterior bias                             ADL either performed or assessed with clinical judgement   ADL Overall ADL's : Needs assistance/impaired Eating/Feeding: Independent   Grooming: Set up;Sitting   Upper Body Bathing: Set up;Sitting   Lower Body Bathing: Sit to/from stand;Moderate assistance   Upper Body Dressing : Set up;Sitting   Lower Body Dressing: Maximal assistance;Sit to/from stand   Toilet Transfer: Moderate assistance;+2 for physical assistance;Ambulation;Comfort height toilet;Rolling walker (2 wheels);Grab bars   Toileting- Clothing Manipulation and Hygiene: Total assistance       Functional mobility during ADLs: Moderate assistance;+2 for physical assistance;Rolling walker (2 wheels);Cueing for sequencing;Cueing for safety       Vision  Baseline Vision/History: 0 No visual deficits;1 Wears glasses Vision Assessment?: No apparent visual deficits     Perception         Praxis         Pertinent Vitals/Pain Pain Assessment Pain Assessment: Faces Faces Pain Scale: Hurts a little bit Pain Location: chest from CPR Pain Descriptors / Indicators: Sore Pain Intervention(s): Limited activity within patient's tolerance     Extremity/Trunk Assessment Upper  Extremity Assessment Upper Extremity Assessment: Right hand dominant;Generalized weakness   Lower Extremity Assessment Lower Extremity Assessment: Defer to PT evaluation (L knee problems at baseline)   Cervical / Trunk Assessment Cervical / Trunk Assessment: Normal   Communication Communication Communication: Impaired Factors Affecting Communication: Hearing impaired   Cognition Arousal: Alert Behavior During Therapy: WFL for tasks assessed/performed Cognition: No family/caregiver present to determine baseline             OT - Cognition Comments: A & O x 4; slow processing; repetitive questions; decreased on-line awareness; will further assess                 Following commands: Intact       Cueing  General Comments          Exercises Exercises: Other exercises Other Exercises Other Exercises: incentive spirometer x 5 - able to pull 250 ml  - max cuing for correct use   Shoulder Instructions      Home Living Family/patient expects to be discharged to:: Private residence Living Arrangements: Alone Available Help at Discharge: Family;Personal care attendant;Available 24 hours/day Type of Home: House Home Access: Stairs to enter [employer] of Steps: from  garage level entry   Home Layout: One level     Bathroom Shower/Tub: Producer, television/film/video: Handicapped height Bathroom Accessibility: Yes How Accessible: Accessible via walker Home Equipment: Agricultural consultant (2 wheels);Rollator (4 wheels);Grab bars - toilet;Grab bars - tub/shower;Shower seat - built in;Hand held shower head;Lift chair (plans to borrow a BSC form her friend)          Prior Functioning/Environment Prior Level of Function : Independent/Modified Independent;Driving             Mobility Comments: uses a rollator around the house adn out to [employer]; uses a cane out in the community; friends help her stand up becuase of a bad Left knee ADLs Comments:  Independent with all IADL tasks with the exception of house cleaning - hires someone to clean her house; uses a lift chair    OT Problem List: Decreased strength;Decreased activity tolerance;Impaired balance (sitting and/or standing);Decreased safety awareness;Cardiopulmonary status limiting activity   OT Treatment/Interventions: Self-care/ADL training;Therapeutic exercise;Energy conservation;DME and/or AE instruction;Therapeutic activities;Patient/family education;Balance training      OT Goals(Current goals can be found in the care plan section)   Acute Rehab OT Goals Patient Stated Goal: to go home OT Goal Formulation: With patient/family Time For Goal Achievement: 07/08/23 Potential to Achieve Goals: Good   OT Frequency:  Min 2X/week    Co-evaluation PT/OT/SLP Co-Evaluation/Treatment: Yes Reason for Co-Treatment: To address functional/ADL transfers;For patient/therapist safety   OT goals addressed during session: ADL's and self-care      AM-PAC OT 6 Clicks Daily Activity     Outcome Measure Help from another person eating meals?: None Help from another person taking care of personal grooming?: A Little Help from another person toileting, which includes using toliet, bedpan, or urinal?: Total Help from another person bathing (including washing, rinsing, drying)?: A Lot Help from another  person to put on and taking off regular upper body clothing?: A Little Help from another person to put on and taking off regular lower body clothing?: A Lot 6 Click Score: 15   End of Session Equipment Utilized During Treatment: Gait belt;Rolling walker (2 wheels) Nurse Communication: Mobility status;Other (comment) (DC needs)  Activity Tolerance: Patient tolerated treatment well Patient left: in chair;with call bell/phone within reach  OT Visit Diagnosis: Unsteadiness on feet (R26.81);Other abnormalities of gait and mobility (R26.89);Muscle weakness (generalized) (M62.81)                 Time: 1610-9604 OT Time Calculation (min): 39 min Charges:  OT General Charges $OT Visit: 1 Visit OT Evaluation $OT Eval Moderate Complexity: 1 Mod OT Treatments $Self Care/Home Management : 8-22 mins  Milburn Aliment, OT/L   Acute OT Clinical Specialist Acute Rehabilitation Services Pager [phone] Office [phone]   [facility] 06/24/2023, 1:45 PM

## 2023-06-25 LAB — CBC
HCT: 27.7 % — ABNORMAL LOW (ref 36.0–46.0)
Hemoglobin: 9.4 g/dL — ABNORMAL LOW (ref 12.0–15.0)
MCH: 34.6 pg — ABNORMAL HIGH (ref 26.0–34.0)
MCHC: 33.9 g/dL (ref 30.0–36.0)
MCV: 101.8 fL — ABNORMAL HIGH (ref 80.0–100.0)
Platelets: 220 10*3/uL (ref 150–400)
RBC: 2.72 MIL/uL — ABNORMAL LOW (ref 3.87–5.11)
RDW: 13.4 % (ref 11.5–15.5)
WBC: 5.6 10*3/uL (ref 4.0–10.5)
nRBC: 0 % (ref 0.0–0.2)

## 2023-06-25 LAB — BASIC METABOLIC PANEL WITH GFR
Anion gap: 11 (ref 5–15)
BUN: 38 mg/dL — ABNORMAL HIGH (ref 8–23)
CO2: 26 mmol/L (ref 22–32)
Calcium: 8.7 mg/dL — ABNORMAL LOW (ref 8.9–10.3)
Chloride: 95 mmol/L — ABNORMAL LOW (ref 98–111)
Creatinine, Ser: 1.47 mg/dL — ABNORMAL HIGH (ref 0.44–1.00)
GFR, Estimated: 34 mL/min — ABNORMAL LOW (ref >60)
Glucose, Bld: 108 mg/dL — ABNORMAL HIGH (ref 70–99)
Potassium: 3.8 mmol/L (ref 3.5–5.1)
Sodium: 132 mmol/L — ABNORMAL LOW (ref 135–145)

## 2023-06-25 LAB — MAGNESIUM: Magnesium: 2.1 mg/dL (ref 1.7–2.4)

## 2023-06-25 MED ORDER — SPIRONOLACTONE 12.5 MG HALF TABLET
12.5000 mg | ORAL_TABLET | Freq: Every day | ORAL | Status: DC
  Administered 2023-06-25 – 2023-06-26 (×2): 12.5 mg via ORAL
  Filled 2023-06-25 (×2): qty 1

## 2023-06-25 MED ORDER — POTASSIUM CHLORIDE CRYS ER 20 MEQ PO TBCR
40.0000 meq | EXTENDED_RELEASE_TABLET | Freq: Once | ORAL | Status: AC
  Administered 2023-06-25: 40 meq via ORAL
  Filled 2023-06-25: qty 2

## 2023-06-25 NOTE — Plan of Care (Signed)

## 2023-06-25 NOTE — Progress Notes (Signed)
 Advanced Heart Failure Rounding Note  Cardiologist: Lonni Cash, MD  AHF Cardiologist: Dan Tristyn Demarest, MD Chief Complaint: VT/VF Patient Profile   [name] is a 89 y.o. female with 89 y.o. female with hyperlipidemia, SVT, PAF, severe AS s/p advert SAPIEN 3 bioprosthetic TAVR 2018, mild nonobstructive CAD previously.   Subjective:    Feels ok. No CP or SOB. SBP remains in 90s. Rhythm stable.   Objective:    Weight Range: 72.7 kg Body mass index is 28.39 kg/m.   Vital Signs:   Temp:  [97.7 F (36.5 C)-98.3 F (36.8 C)] 97.9 F (36.6 C) (06/21 1116) Pulse Rate:  [58-67] 64 (06/21 1116) Resp:  [15-20] 20 (06/21 1116) BP: (93-110)/(46-54) 95/46 (06/21 1116) SpO2:  [90 %-97 %] 94 % (06/21 1116) Weight:  [72.7 kg] 72.7 kg (06/21 0442) Last BM Date : 06/21/23  Weight change: Filed Weights   06/23/23 0500 06/24/23 0201 06/25/23 0442  Weight: 75.3 kg 74 kg 72.7 kg   Intake/Output:  Intake/Output Summary (Last 24 hours) at 06/25/2023 1141 Last data filed at 06/25/2023 0846 Gross per 24 hour  Intake 360 ml  Output --  Net 360 ml    Physical Exam    General: Elderly. Sitting in chair  No resp difficulty HEENT: normal Neck: supple. JVP 6-7  Cor:  Regular rate & rhythm. No rubs, gallops or murmurs. Lungs: clear but decreased at bases Abdomen: soft, nontender, nondistended.Good bowel sounds. Extremities: no cyanosis, clubbing, rash, tr edema Neuro: alert & orientedx3, cranial nerves grossly intact. moves all 4 extremities w/o difficulty. Affect pleasant  Telemetry   SR 50-60, occasional PVCs Personally reviewed  Labs    CBC Recent Labs    06/24/23 0301 06/25/23 0241  WBC 5.7 5.6  HGB 10.4* 9.4*  HCT 30.7* 27.7*  MCV 102.0* 101.8*  PLT 209 220   Basic Metabolic Panel Recent Labs    93/79/74 0301 06/25/23 0241  NA 130* 132*  K 3.8 3.8  CL 94* 95*  CO2 26 26  GLUCOSE 106* 108*  BUN 39* 38*  CREATININE 1.47* 1.47*  CALCIUM 8.4* 8.7*  MG  2.3 2.1   ProBNP (last 3 results) Recent Labs    11/01/22 0845 11/09/22 0857  PROBNP 348 554   Fasting Lipid Panel No results for input(s): CHOL, HDL, LDLCALC, TRIG, CHOLHDL, LDLDIRECT in the last 72 hours.  Medications:    Scheduled Medications:  amiodarone   400 mg Oral BID   Followed by   NOREEN ON 07/07/2023] amiodarone   400 mg Oral Daily   clopidogrel   75 mg Oral Daily   furosemide   40 mg Oral Daily   levothyroxine   112 mcg Oral Q0600   lidocaine   1 patch Transdermal Q24H   mexiletine  150 mg Oral Q8H   pantoprazole   40 mg Oral Daily   polyethylene glycol  17 g Oral Daily   potassium chloride   40 mEq Oral Once   rivaroxaban   15 mg Oral Q supper   sodium chloride  flush  3 mL Intravenous Q12H    Infusions:    PRN Medications: acetaminophen , alum & mag hydroxide-simeth, guaiFENesin -dextromethorphan , magnesium  hydroxide, ondansetron  (ZOFRAN ) IV, mouth rinse, oxyCODONE , sodium chloride  flush  Assessment/Plan   CAD with acute anterior STEMI - Cath 06/19/23: 99% pLAD with minimal flow distally s/p DES. Lcx/RCA ok EF 25%  - Xarelto  + Plavix . No ASA with DOAC - now with post MI VT, due to reperfusion, improved - No anginal symptoms - myalgias with statins  Acute Systolic Heart Failure - Ischemic CM: EF ~25% on cath (EF previously 55-60% in 11/23) - RHC 6/15: RA 10 PA 74/25 (45) PCW 25 Fick 3.3/1.9 TD 2.9/1.7 on NE 7 in cath lab   - Echo 6/16: EF 40-45%, G2DD, nl RV - Introducer removed, no inotropes with recent VT/VF - GDMT limited by soft BP - Continue Lasix  40 mg daily - Add spiro 12.5 - Consider low-dose ARB vs SGLT2i as tolerated   VT/VF NSVT - 6/16 developed persistent, long runs of NSVT 5/16 on tele. Started on Amio and Lidocaine . Lytes replaced - 6/17 overnight sustained VT/VF with lost pulse, received 1 round CPR + 300 mg amio bolus with ROSC and conversion to NSR. - Rhythm stabilized - Keep K >4 and Mg >2; lytes okay this morning - continue  mexiletine 150 mg tid - Conitnue amio 400 mg bid for 2wks, then 400 gm daily.  - Discussed LifeVest, will defer at this time   Pulmonary HTN - mixed picture (both pre and post-capillary) - HF treatment as above   H/o AS s/p TAVR 2018 - mild AI and AS. Stable valve. AVA 0.88cm2.    PAF - continue Xarelto  - continue amio  AKI on CKD2 - b/l Cr 1.01 - in the setting of contrast and persistent ventricular arrhythmias resulting in brief CPR.  - creatinine stable at 1.47  - daily lasix  as above - adding spiro  Dispo; D/c to SNF early next week. Continue PT/OT and pulmonary toilet    Length of Stay: 6  Toribio Fuel, MD  06/25/2023, 11:41 AM  Advanced Heart Failure Team Pager [phone] (M-F; 7a - 5p) Amion for HF Team after hours.

## 2023-06-26 LAB — CBC
HCT: 28.9 % — ABNORMAL LOW (ref 36.0–46.0)
Hemoglobin: 9.7 g/dL — ABNORMAL LOW (ref 12.0–15.0)
MCH: 34.6 pg — ABNORMAL HIGH (ref 26.0–34.0)
MCHC: 33.6 g/dL (ref 30.0–36.0)
MCV: 103.2 fL — ABNORMAL HIGH (ref 80.0–100.0)
Platelets: 252 10*3/uL (ref 150–400)
RBC: 2.8 MIL/uL — ABNORMAL LOW (ref 3.87–5.11)
RDW: 13.6 % (ref 11.5–15.5)
WBC: 6.5 10*3/uL (ref 4.0–10.5)
nRBC: 0 % (ref 0.0–0.2)

## 2023-06-26 LAB — BASIC METABOLIC PANEL WITH GFR
Anion gap: 7 (ref 5–15)
BUN: 37 mg/dL — ABNORMAL HIGH (ref 8–23)
CO2: 25 mmol/L (ref 22–32)
Calcium: 8.6 mg/dL — ABNORMAL LOW (ref 8.9–10.3)
Chloride: 96 mmol/L — ABNORMAL LOW (ref 98–111)
Creatinine, Ser: 1.51 mg/dL — ABNORMAL HIGH (ref 0.44–1.00)
GFR, Estimated: 33 mL/min — ABNORMAL LOW (ref >60)
Glucose, Bld: 105 mg/dL — ABNORMAL HIGH (ref 70–99)
Potassium: 4.4 mmol/L (ref 3.5–5.1)
Sodium: 128 mmol/L — ABNORMAL LOW (ref 135–145)

## 2023-06-26 LAB — MAGNESIUM: Magnesium: 2.1 mg/dL (ref 1.7–2.4)

## 2023-06-26 MED ORDER — SPIRONOLACTONE 25 MG PO TABS
25.0000 mg | ORAL_TABLET | Freq: Every day | ORAL | Status: DC
  Administered 2023-06-27 – 2023-06-28 (×2): 25 mg via ORAL
  Filled 2023-06-26 (×2): qty 1

## 2023-06-26 NOTE — Plan of Care (Signed)

## 2023-06-26 NOTE — Plan of Care (Signed)

## 2023-06-26 NOTE — Progress Notes (Signed)
 Advanced Heart Failure Rounding Note  Cardiologist: Lonni Cash, MD  [facility] Cardiologist: Dan Bensimhon, MD Chief Complaint: VT/VF Patient Profile   Ms. Morin is a 89 y.o. female with 89 y.o. female with hyperlipidemia, SVT, PAF, severe AS s/p advert SAPIEN 3 bioprosthetic TAVR 2018, mild nonobstructive CAD previously.   Subjective:    No complaints today. 2 person assist for out of chair. Medically stable for discharge to SNF when a bed is found. Creatinine stable.   Objective:    Weight Range: 73.4 kg Body mass index is 28.66 kg/m.   Vital Signs:   Temp:  [97.5 F (36.4 C)-98.7 F (37.1 C)] 97.6 F (36.4 C) (06/22 1520) Pulse Rate:  [60-66] 61 (06/22 1520) Resp:  [17-21] 19 (06/21 2319) BP: (89-112)/(41-92) 103/52 (06/22 1520) SpO2:  [93 %-95 %] 95 % (06/22 1520) Weight:  [73.4 kg] 73.4 kg (06/22 0415) Last BM Date : 06/24/23  Weight change: Filed Weights   06/24/23 0201 06/25/23 0442 06/26/23 0415  Weight: 74 kg 72.7 kg 73.4 kg   Intake/Output:  Intake/Output Summary (Last 24 hours) at 06/26/2023 1538 Last data filed at 06/26/2023 1108 Gross per 24 hour  Intake 440 ml  Output 250 ml  Net 190 ml    Physical Exam    General: Elderly. Sitting in chair  No resp difficulty Cor:  Regular rate & rhythm. No rubs, gallops or murmurs. JVP 6, trace edema Lungs:normal WOB Abdomen: soft, nontender, nondistended.Good bowel sounds.  Telemetry   SR 50-60s  Labs    CBC Recent Labs    06/25/23 0241 06/26/23 0237  WBC 5.6 6.5  HGB 9.4* 9.7*  HCT 27.7* 28.9*  MCV 101.8* 103.2*  PLT 220 252   Basic Metabolic Panel Recent Labs    93/78/74 0241 06/26/23 0237  NA 132* 128*  K 3.8 4.4  CL 95* 96*  CO2 26 25  GLUCOSE 108* 105*  BUN 38* 37*  CREATININE 1.47* 1.51*  CALCIUM 8.7* 8.6*  MG 2.1 2.1   ProBNP (last 3 results) Recent Labs    11/01/22 0845 11/09/22 0857  PROBNP 348 554   Fasting Lipid Panel No results for input(s): CHOL,  HDL, LDLCALC, TRIG, CHOLHDL, LDLDIRECT in the last 72 hours.  Medications:    Scheduled Medications:  amiodarone   400 mg Oral BID   Followed by   NOREEN ON 07/07/2023] amiodarone   400 mg Oral Daily   clopidogrel   75 mg Oral Daily   furosemide   40 mg Oral Daily   levothyroxine   112 mcg Oral Q0600   lidocaine   1 patch Transdermal Q24H   mexiletine  150 mg Oral Q8H   pantoprazole   40 mg Oral Daily   polyethylene glycol  17 g Oral Daily   rivaroxaban   15 mg Oral Q supper   sodium chloride  flush  3 mL Intravenous Q12H   spironolactone   12.5 mg Oral Daily    Infusions:    PRN Medications: acetaminophen , alum & mag hydroxide-simeth, guaiFENesin -dextromethorphan , magnesium  hydroxide, ondansetron  (ZOFRAN ) IV, mouth rinse, oxyCODONE , sodium chloride  flush  Assessment/Plan   CAD with acute anterior STEMI - Cath 06/19/23: 99% pLAD with minimal flow distally s/p DES. Lcx/RCA ok EF 25%  - Xarelto  + Plavix . No ASA with DOAC - C/b post MI VT, due to reperfusion, improved - No anginal symptoms - myalgias with statins   Acute Systolic Heart Failure - Ischemic CM: EF ~25% on cath (EF previously 55-60% in 11/23) - RHC 6/15: RA 10 PA 74/25 (45)  PCW 25 Fick 3.3/1.9 TD 2.9/1.7 on NE 7 in cath lab   - Echo 6/16: EF 40-45%, G2DD, nl RV - Introducer removed, no inotropes with recent VT/VF - GDMT limited by soft BP - Continue Lasix  40 mg daily - Continue spironolactone  12.5mg , increase to 25mg  daily tomorrow - Consider low-dose ARB vs SGLT2i as tolerated   VT/VF NSVT - 6/16 developed persistent, long runs of NSVT 5/16 on tele. Started on Amio and Lidocaine . Lytes replaced - 6/17 overnight sustained VT/VF with lost pulse, received 1 round CPR + 300 mg amio bolus with ROSC and conversion to NSR. - Rhythm stabilized - Keep K >4 and Mg >2; lytes fine this morning, ectopy improved - continue mexiletine 150 mg tid - Conitnue amio 400 mg bid for 2wks, then 400mg  daily.  - Discussed  LifeVest, will defer at this time  Pulmonary HTN - mixed picture (both pre and post-capillary) - HF treatment as above   H/o AS s/p TAVR 2018 - mild AI and AS. Stable valve. AVA 0.88cm2.    PAF - continue Xarelto  - continue amio  AKI on CKD2 - b/l Cr 1.01 - in the setting of contrast and persistent ventricular arrhythmias resulting in brief CPR.  - creatinine stable at 1.47  - daily lasix  as above - Continue spironolactone   Dispo; D/c to SNF when bed available   Length of Stay: 7  Morene JINNY Brownie, MD  06/26/2023, 3:38 PM  Advanced Heart Failure Team Pager [phone] (M-F; 7a - 5p) Amion for HF Team after hours.

## 2023-06-27 LAB — CBC
HCT: 28.7 % — ABNORMAL LOW (ref 36.0–46.0)
Hemoglobin: 9.5 g/dL — ABNORMAL LOW (ref 12.0–15.0)
MCH: 34.4 pg — ABNORMAL HIGH (ref 26.0–34.0)
MCHC: 33.1 g/dL (ref 30.0–36.0)
MCV: 104 fL — ABNORMAL HIGH (ref 80.0–100.0)
Platelets: 255 10*3/uL (ref 150–400)
RBC: 2.76 MIL/uL — ABNORMAL LOW (ref 3.87–5.11)
RDW: 13.8 % (ref 11.5–15.5)
WBC: 6.3 10*3/uL (ref 4.0–10.5)
nRBC: 0.3 % — ABNORMAL HIGH (ref 0.0–0.2)

## 2023-06-27 LAB — BASIC METABOLIC PANEL WITH GFR
Anion gap: 12 (ref 5–15)
BUN: 36 mg/dL — ABNORMAL HIGH (ref 8–23)
CO2: 27 mmol/L (ref 22–32)
Calcium: 9.2 mg/dL (ref 8.9–10.3)
Chloride: 93 mmol/L — ABNORMAL LOW (ref 98–111)
Creatinine, Ser: 1.63 mg/dL — ABNORMAL HIGH (ref 0.44–1.00)
GFR, Estimated: 30 mL/min — ABNORMAL LOW (ref >60)
Glucose, Bld: 101 mg/dL — ABNORMAL HIGH (ref 70–99)
Potassium: 3.8 mmol/L (ref 3.5–5.1)
Sodium: 132 mmol/L — ABNORMAL LOW (ref 135–145)

## 2023-06-27 LAB — MAGNESIUM: Magnesium: 2 mg/dL (ref 1.7–2.4)

## 2023-06-27 MED ORDER — POTASSIUM CHLORIDE CRYS ER 20 MEQ PO TBCR
20.0000 meq | EXTENDED_RELEASE_TABLET | Freq: Once | ORAL | Status: AC
  Administered 2023-06-27: 20 meq via ORAL
  Filled 2023-06-27: qty 1

## 2023-06-27 NOTE — TOC Progression Note (Addendum)
 Transition of Care [facility]) - Progression Note    Patient Details  Name: [name] MRN: [id number] Date of Birth: 07/09/1931  [facility] CM/SW Contact  Arlana JINNY Moats, LCSWA Phone Number:[phone] 06/27/2023, 10:07 AM  Clinical Narrative:   9:24 AM-  HF CSW called and left a VM with Whitney at [facility] to inquire about bed availability. Family stated that they have a contact there.   12:04 PM- HF CSW called Whitney at [facility]. [place] will be out of town until /24.   HF CSW met with patient and daughter at bedside to present accepted bed offers list.  CSW explained that [facility] has not called back and they typically have a wait list. CSW explained that patient is medically ready for dc and a decision will need to be made soon. Patients daughter appeared to be upset because they are just receiving list today to review. CSW apologized that accepted bed SNF offers were not presented over the weekend as a note was left to do so. Patients daughter stated that she will review list with her DIL and calle me back with a decision. CSW explained that bed offers are subject to change on a daily and the sooner we reach a decision the better.   Accepted bed offers list:                  Skilled Nursing Rehab Facilities-   [email or web address]     Ratings out of 5 stars (5 the highest)    Name Address  Phone # Quality Care Staffing Health Inspection Overall  [facility]** [phone], [facility] [phone] 1 1 1   [facility] [address] [place] [phone] 5 1 2 2   [facility] [phone] [address] [place] [phone] 2 3 4 4   [facility] ([facility]) [address] [place] [phone] 1 3 3 2   [facility] [address] [place] [phone] 3 1 1 1    12:25 PM- HF CSW received a call from patient daughter who stated that they made contact with an employee at [facility] who  stated that they have a bed for the patinet. Daughter is not happy with the presented options. CSW explained that she needs the employees name and contact information to speak with them directly to make sure the transition from Hospital to SNF is smooth. Patients daughter stated that they will call.   HF CSW received a call from the patients daughter, DIL who stated that they are not pleased with the presented bed offers list. They are hoping to go to [facility]. CSW asked for their point of contact to give CSW a call or provide contact information, the family declined. She said she will call CSW with updates shortly. CSW asked if the family would like to dc home and they continue the SNF process. Family declined. CSW explained that a safe dc is the priority. CSW explained to minimize confusion and following the SNF process procedures would require me to speak to someone from the facility directly. DIL was upset and did not agree. CSW explained role for dc process. DIL stated that she will call back.   CSW contacted the following additional pending facilities:   [place]- possible [employer] [facility] (family choice, if [facility] not available) [facility]- not available   3:33 PM- HF CSW called [facility] (DIL), to share updated SNF accepted bed list and left a VM.    3:35  PM- HF CSW called [place] (patients daughter), to share updated SNF options. Family will review and update CSW. CSW shared updates. Family choice- [facility].     TOC will continue following.   Expected Discharge Plan: Home w Home Health Services Barriers to Discharge: Continued Medical Work up  Expected Discharge Plan and Services   Discharge Planning Services: CM Consult Post Acute Care Choice: Home Health Living arrangements for the past 2 months: Single Family Home                                       Social Determinants of Health (SDOH) Interventions SDOH Screenings    Food Insecurity: No Food Insecurity (06/20/2023)  Housing: Low Risk  (06/20/2023)  Transportation Needs: No Transportation Needs (06/20/2023)  Utilities: Not At Risk (06/20/2023)  Social Connections: Moderately Isolated (06/20/2023)  Tobacco Use: Low Risk  (06/23/2023)    Readmission Risk Interventions     No data to display

## 2023-06-27 NOTE — Progress Notes (Addendum)
 Physical Therapy Treatment Patient Details Name: [name] MRN: [id number] DOB: Mar 15, 1931 Today's Date: 06/27/2023   History of Present Illness Patient is an 89 y/o female admitted 06/19/23 with chest pain found to have STEMI and underwent cath found to have 99% pLAD s/p DES on 6/15, developed long runs of NSVT 6/16 started on Amio and sustained VT/VF lost pulse overnight 6/17 had 1 round CPR and 300mg  amio bolus with ROSC and conversion to NSR.  Work up for [employer] ongoing. PMH: hyperlipidemia, SVT, PAF, severe AS s/p advert SAPIEN 3 bioprosthetic TAVR 2018, mild nonobstructive CAD.    PT Comments  Pt admitted with above diagnosis. Pt very motivated to work with PT.  Pt needs mod to max assist for mobility as she has difficulty moving without 2 person assist.  Pt with trunk weakness and left knee weakness (left knee weakness premorbid per pt).  Messaged MD to make aware of trunk weakness. Pt could not take steps today even though attempted.  Will continue to follow acutely.  Pt will benefit from post acute rehab > 3 hours day. Pt currently with functional limitations due to the deficits listed below (see PT Problem List). Pt will benefit from acute skilled PT to increase their independence and safety with mobility to allow discharge.       If plan is discharge home, recommend the following: A lot of help with bathing/dressing/bathroom;A lot of help with walking and/or transfers;Assist for transportation;Assistance with cooking/housework   Can travel by private vehicle     No  Equipment Recommendations  None recommended by PT    Recommendations for Other Services       Precautions / Restrictions Precautions Precautions: Fall Recall of Precautions/Restrictions: Intact Precaution/Restrictions Comments: watch HR Restrictions Weight Bearing Restrictions Per Provider Order: No     Mobility  Bed Mobility Overal bed mobility: Needs Assistance Bed Mobility: Supine to Sit      Supine to sit: Mod assist, +2 for physical assistance, HOB elevated, Used rails     General bed mobility comments: Assist for LEs and for trunk as well as use of pad to slide pt out to EOB.    Transfers Overall transfer level: Needs assistance Equipment used: Rolling walker (2 wheels) Transfers: Sit to/from Stand, Bed to chair/wheelchair/BSC Sit to Stand: +2 physical assistance, Max assist, From elevated surface Stand pivot transfers: Mod assist, +2 physical assistance, From elevated surface         General transfer comment: vc for sequencing' trying to pull up on rollator and lifting help to stand as pt with significant left lateral lean and posterior.  Needed mod assist of 2 to stand and pivot to recliner with rollator.    Ambulation/Gait Ambulation/Gait assistance: Mod assist, +2 physical assistance   Assistive device: Rolling walker (2 wheels) Gait Pattern/deviations: Step-to pattern, Step-through pattern, Leaning posteriorly, Shuffle, Knee flexed in stance - left       General Gait Details: leaning L and posteriorly throughout, assist for balance, cues for posture.  Pt could not take steps forward even with assist.   Stairs             Wheelchair Mobility     Tilt Bed    Modified Rankin (Stroke Patients Only)       Balance Overall balance assessment: Needs assistance Sitting-balance support: Bilateral upper extremity supported, Feet supported Sitting balance-Leahy Scale: Poor Sitting balance - Comments: heavy left lateral lean and posterior and pt needing min assist to balance sitting at  EOB.   Standing balance support: Bilateral upper extremity supported Standing balance-Leahy Scale: Poor Standing balance comment: mod A of 2 for standing with UE support with heavy left lateral lean and posterior lean                            Communication Communication Communication: Impaired Factors Affecting Communication: Hearing impaired   Cognition Arousal: Alert Behavior During Therapy: WFL for tasks assessed/performed                             Following commands: Intact      Cueing Cueing Techniques: Verbal cues  Exercises General Exercises - Lower Extremity Ankle Circles/Pumps: AROM, 5 reps, Both, Supine Long Arc Quad: AROM, Both, 10 reps, Seated Hip Flexion/Marching: AROM, Both, 10 reps, Seated    General Comments General comments (skin integrity, edema, etc.): VSS      Pertinent Vitals/Pain Pain Assessment Pain Assessment: Faces Faces Pain Scale: Hurts a little bit Pain Location: chest from CPR Pain Descriptors / Indicators: Sore Pain Intervention(s): Limited activity within patient's tolerance, Monitored during session, Repositioned    Home Living                          Prior Function            PT Goals (current goals can now be found in the care plan section) Acute Rehab PT Goals Patient Stated Goal: get stronger Progress towards PT goals: Progressing toward goals    Frequency    Min 3X/week      PT Plan      Co-evaluation              AM-PAC PT 6 Clicks Mobility   Outcome Measure  Help needed turning from your back to your side while in a flat bed without using bedrails?: A Lot Help needed moving from lying on your back to sitting on the side of a flat bed without using bedrails?: A Lot Help needed moving to and from a bed to a chair (including a wheelchair)?: Total Help needed standing up from a chair using your arms (e.g., wheelchair or bedside chair)?: Total Help needed to walk in hospital room?: Total Help needed climbing 3-5 steps with a railing? : Total 6 Click Score: 8    End of Session Equipment Utilized During Treatment: Gait belt Activity Tolerance: Patient limited by fatigue Patient left: in chair;with call bell/phone within reach;with chair alarm set;with family/visitor present Nurse Communication: Mobility status PT Visit  Diagnosis: Muscle weakness (generalized) (M62.81);Other abnormalities of gait and mobility (R26.89)     Time: 8843-8780 PT Time Calculation (min) (ACUTE ONLY): 23 min  Charges:    $Gait Training: 8-22 mins $Therapeutic Exercise: 8-22 mins PT General Charges $$ ACUTE PT VISIT: 1 Visit                     Levonia Wolfley M,PT Acute Rehab Services [phone]    Stephane JULIANNA Bevel 06/27/2023, 12:51 PM

## 2023-06-27 NOTE — Progress Notes (Signed)
 Advanced Heart Failure Rounding Note  Cardiologist: Lonni Cash, MD  [facility] Cardiologist: Dan Bensimhon, MD Chief Complaint: VT/VF Patient Profile   [name] is a 89 y.o. female with 89 y.o. female with hyperlipidemia, SVT, PAF, severe AS s/p advert SAPIEN 3 bioprosthetic TAVR 2018, mild nonobstructive CAD previously.   Subjective:    Feels fine. Family at bedside. Says that her legs are chronically like jelly and she struggles to get around.   Objective:    Weight Range: 72.7 kg Body mass index is 28.39 kg/m.   Vital Signs:   Temp:  [97.6 F (36.4 C)-98.2 F (36.8 C)] 98.2 F (36.8 C) (06/23 1059) Pulse Rate:  [58-67] 67 (06/23 0325) Resp:  [16-20] 20 (06/23 1059) BP: (101-129)/(52-68) 111/58 (06/23 1059) SpO2:  [92 %-97 %] 97 % (06/23 1059) Weight:  [72.7 kg] 72.7 kg (06/23 0351) Last BM Date : 06/26/23  Weight change: Filed Weights   06/25/23 0442 06/26/23 0415 06/27/23 0351  Weight: 72.7 kg 73.4 kg 72.7 kg   Intake/Output:  Intake/Output Summary (Last 24 hours) at 06/27/2023 1126 Last data filed at 06/27/2023 0700 Gross per 24 hour  Intake 363 ml  Output 1325 ml  Net -962 ml    Physical Exam  General:  elderly appearing.  No respiratory difficulty Neck: supple. JVD flat.  Cor: PMI nondisplaced. Regular rate & rhythm. No rubs, gallops or murmurs. Lungs: clear Extremities: no edema  Neuro: alert & oriented x 3. Affect pleasant.   Telemetry   NSR 60s (Personally reviewed)    Labs    CBC Recent Labs    06/26/23 0237 06/27/23 0234  WBC 6.5 6.3  HGB 9.7* 9.5*  HCT 28.9* 28.7*  MCV 103.2* 104.0*  PLT 252 255   Basic Metabolic Panel Recent Labs    93/77/74 0237 06/27/23 0234  NA 128* 132*  K 4.4 3.8  CL 96* 93*  CO2 25 27  GLUCOSE 105* 101*  BUN 37* 36*  CREATININE 1.51* 1.63*  CALCIUM 8.6* 9.2  MG 2.1 2.0   ProBNP (last 3 results) Recent Labs    11/01/22 0845 11/09/22 0857  PROBNP 348 554   Fasting Lipid Panel No  results for input(s): CHOL, HDL, LDLCALC, TRIG, CHOLHDL, LDLDIRECT in the last 72 hours.  Medications:    Scheduled Medications:  amiodarone   400 mg Oral BID   Followed by   NOREEN ON 07/07/2023] amiodarone   400 mg Oral Daily   clopidogrel   75 mg Oral Daily   furosemide   40 mg Oral Daily   levothyroxine   112 mcg Oral Q0600   lidocaine   1 patch Transdermal Q24H   mexiletine  150 mg Oral Q8H   pantoprazole   40 mg Oral Daily   polyethylene glycol  17 g Oral Daily   rivaroxaban   15 mg Oral Q supper   sodium chloride  flush  3 mL Intravenous Q12H   spironolactone   25 mg Oral Daily    Infusions:    PRN Medications: acetaminophen , alum & mag hydroxide-simeth, guaiFENesin -dextromethorphan , magnesium  hydroxide, ondansetron  (ZOFRAN ) IV, mouth rinse, oxyCODONE , sodium chloride  flush  Assessment/Plan   CAD with acute anterior STEMI - Cath 06/19/23: 99% pLAD with minimal flow distally s/p DES. Lcx/RCA ok EF 25%  - Xarelto  + Plavix . No ASA with DOAC - C/b post MI VT, due to reperfusion, improved - No anginal symptoms - myalgias with statins   Acute Systolic Heart Failure - Ischemic CM: EF ~25% on cath (EF previously 55-60% in 11/23) - RHC  6/15: RA 10 PA 74/25 (45) PCW 25 Fick 3.3/1.9 TD 2.9/1.7 on NE 7 in cath lab   - Echo 6/16: EF 40-45%, G2DD, nl RV - Introducer removed, no inotropes with recent VT/VF - GDMT limited by soft BP - Continue Lasix  40 mg daily - Continue spironolactone  25mg  daily  - Consider low-dose ARB vs SGLT2i as tolerated. As of today we will hold off on SGLT2i with limited mobility and I will hold off on starting ARB with dizziness with activity.    VT/VF NSVT - 6/16 developed persistent, long runs of NSVT 5/16 on tele. Started on Amio and Lidocaine . Lytes replaced - 6/17 overnight sustained VT/VF with lost pulse, received 1 round CPR + 300 mg amio bolus with ROSC and conversion to NSR. - Rhythm stabilized - Keep K >4 and Mg >2; lytes fine this  morning, ectopy improved - continue mexiletine 150 mg tid - Conitnue amio 400 mg bid for 2wks, then 400mg  daily.  - Discussed LifeVest, will defer at this time  Pulmonary HTN - mixed picture (both pre and post-capillary) - HF treatment as above   H/o AS s/p TAVR 2018 - mild AI and AS. Stable valve. AVA 0.88cm2.    PAF - continue Xarelto  - continue amio  AKI on CKD2 - b/l Cr 1.01 - in the setting of contrast and persistent ventricular arrhythmias resulting in brief CPR.  - creatinine stable at 1.63  - daily lasix  as above - Continue spironolactone   Dispo; D/c to SNF when bed available  Length of Stay: 8  Beckey LITTIE Coe, NP  06/27/2023, 11:26 AM  Advanced Heart Failure Team Pager [phone] (M-F; 7a - 5p) Amion for HF Team after hours.

## 2023-06-27 NOTE — Plan of Care (Signed)

## 2023-06-27 NOTE — TOC Progression Note (Addendum)
 Transition of Care [facility]) - Progression Note    Patient Details  Name: [name] MRN: [id number] Date of Birth: [birth year]  Transition of Care [facility]) CM/SW Contact  Justina Delcia Czar, RN Phone Number: [phone] 06/27/2023, 1:27 PM  Clinical Narrative:     TOC CM contacted [facility] and office # [phone], their team is in a training. Unable to get information if they can accommodate pt for SNF rehab. Family do not accept other bed offers.   Contacted [facility] rep, Clotilda states she will pass message over to [facility] Elizabeth[facility] for bed availability.   Spoke to pt and states she wants to get stronger before she goes home. She states her family is working on [facility] or [facility] [employer]   Received message from [facility], Admission Coordinator, Whitney and they cannot extend a bed, SNF rehab is full at this time. Updated attending. Dtr requesting a call from attending.   1430 Received a call from pt's dtr, provided update that PT was going to evaluate for IP rehab. She is agreeable. Wanted to review other choice for SNF rehab. Discussed [facility], [facility]. [facility] and [facility]. CSW will reach out to facilities for bed availability.   TOC CM spoke to dtr and she is agreeable to [facility]. She will bring her some clothes in am.    Expected Discharge Plan: Home w Home Health Services Barriers to Discharge: Continued Medical Work up  Expected Discharge Plan and Services   Discharge Planning Services: CM Consult Post Acute Care Choice: Home Health Living arrangements for the past 2 months: Single Family Home                                       Social Determinants of Health (SDOH) Interventions SDOH Screenings   Food Insecurity: No Food Insecurity (06/20/2023)  Housing: Low Risk  (06/20/2023)  Transportation Needs: No Transportation Needs (06/20/2023)  Utilities: Not At Risk (06/20/2023)  Social  Connections: Moderately Isolated (06/20/2023)  Tobacco Use: Low Risk  (06/23/2023)    Readmission Risk Interventions     No data to display

## 2023-06-28 ENCOUNTER — Other Ambulatory Visit (HOSPITAL_COMMUNITY): Payer: Self-pay

## 2023-06-28 DIAGNOSIS — Z7902 Long term (current) use of antithrombotics/antiplatelets: Secondary | ICD-10-CM | POA: Diagnosis not present

## 2023-06-28 DIAGNOSIS — R531 Weakness: Secondary | ICD-10-CM | POA: Diagnosis not present

## 2023-06-28 DIAGNOSIS — N189 Chronic kidney disease, unspecified: Secondary | ICD-10-CM | POA: Diagnosis not present

## 2023-06-28 DIAGNOSIS — I472 Ventricular tachycardia, unspecified: Secondary | ICD-10-CM | POA: Diagnosis not present

## 2023-06-28 DIAGNOSIS — Z8679 Personal history of other diseases of the circulatory system: Secondary | ICD-10-CM | POA: Diagnosis not present

## 2023-06-28 DIAGNOSIS — I480 Paroxysmal atrial fibrillation: Secondary | ICD-10-CM | POA: Diagnosis not present

## 2023-06-28 DIAGNOSIS — Z7901 Long term (current) use of anticoagulants: Secondary | ICD-10-CM | POA: Diagnosis not present

## 2023-06-28 DIAGNOSIS — N1832 Chronic kidney disease, stage 3b: Secondary | ICD-10-CM | POA: Diagnosis not present

## 2023-06-28 DIAGNOSIS — N179 Acute kidney failure, unspecified: Secondary | ICD-10-CM | POA: Diagnosis not present

## 2023-06-28 DIAGNOSIS — E039 Hypothyroidism, unspecified: Secondary | ICD-10-CM | POA: Diagnosis not present

## 2023-06-28 DIAGNOSIS — I5042 Chronic combined systolic (congestive) and diastolic (congestive) heart failure: Secondary | ICD-10-CM | POA: Diagnosis not present

## 2023-06-28 DIAGNOSIS — I499 Cardiac arrhythmia, unspecified: Secondary | ICD-10-CM | POA: Diagnosis not present

## 2023-06-28 DIAGNOSIS — D631 Anemia in chronic kidney disease: Secondary | ICD-10-CM | POA: Diagnosis not present

## 2023-06-28 DIAGNOSIS — Z7401 Bed confinement status: Secondary | ICD-10-CM | POA: Diagnosis not present

## 2023-06-28 DIAGNOSIS — Z952 Presence of prosthetic heart valve: Secondary | ICD-10-CM | POA: Diagnosis not present

## 2023-06-28 DIAGNOSIS — R0989 Other specified symptoms and signs involving the circulatory and respiratory systems: Secondary | ICD-10-CM | POA: Diagnosis not present

## 2023-06-28 DIAGNOSIS — I272 Pulmonary hypertension, unspecified: Secondary | ICD-10-CM | POA: Diagnosis not present

## 2023-06-28 DIAGNOSIS — K922 Gastrointestinal hemorrhage, unspecified: Secondary | ICD-10-CM | POA: Diagnosis not present

## 2023-06-28 DIAGNOSIS — K219 Gastro-esophageal reflux disease without esophagitis: Secondary | ICD-10-CM | POA: Diagnosis not present

## 2023-06-28 DIAGNOSIS — R0600 Dyspnea, unspecified: Secondary | ICD-10-CM | POA: Diagnosis not present

## 2023-06-28 DIAGNOSIS — Z955 Presence of coronary angioplasty implant and graft: Secondary | ICD-10-CM | POA: Diagnosis not present

## 2023-06-28 DIAGNOSIS — I2102 ST elevation (STEMI) myocardial infarction involving left anterior descending coronary artery: Secondary | ICD-10-CM | POA: Diagnosis not present

## 2023-06-28 DIAGNOSIS — I959 Hypotension, unspecified: Secondary | ICD-10-CM | POA: Diagnosis not present

## 2023-06-28 DIAGNOSIS — R001 Bradycardia, unspecified: Secondary | ICD-10-CM | POA: Diagnosis not present

## 2023-06-28 DIAGNOSIS — I5023 Acute on chronic systolic (congestive) heart failure: Secondary | ICD-10-CM | POA: Diagnosis not present

## 2023-06-28 DIAGNOSIS — R079 Chest pain, unspecified: Secondary | ICD-10-CM | POA: Diagnosis not present

## 2023-06-28 DIAGNOSIS — I2489 Other forms of acute ischemic heart disease: Secondary | ICD-10-CM | POA: Diagnosis not present

## 2023-06-28 DIAGNOSIS — E871 Hypo-osmolality and hyponatremia: Secondary | ICD-10-CM | POA: Diagnosis not present

## 2023-06-28 DIAGNOSIS — R58 Hemorrhage, not elsewhere classified: Secondary | ICD-10-CM | POA: Diagnosis not present

## 2023-06-28 DIAGNOSIS — Z79890 Hormone replacement therapy (postmenopausal): Secondary | ICD-10-CM | POA: Diagnosis not present

## 2023-06-28 DIAGNOSIS — I2510 Atherosclerotic heart disease of native coronary artery without angina pectoris: Secondary | ICD-10-CM | POA: Diagnosis not present

## 2023-06-28 DIAGNOSIS — R609 Edema, unspecified: Secondary | ICD-10-CM | POA: Diagnosis not present

## 2023-06-28 DIAGNOSIS — R0902 Hypoxemia: Secondary | ICD-10-CM | POA: Diagnosis not present

## 2023-06-28 DIAGNOSIS — E875 Hyperkalemia: Secondary | ICD-10-CM | POA: Diagnosis not present

## 2023-06-28 DIAGNOSIS — I700 Atherosclerosis of aorta: Secondary | ICD-10-CM | POA: Diagnosis not present

## 2023-06-28 DIAGNOSIS — Z79899 Other long term (current) drug therapy: Secondary | ICD-10-CM | POA: Diagnosis not present

## 2023-06-28 DIAGNOSIS — E785 Hyperlipidemia, unspecified: Secondary | ICD-10-CM | POA: Diagnosis not present

## 2023-06-28 DIAGNOSIS — Z66 Do not resuscitate: Secondary | ICD-10-CM | POA: Diagnosis not present

## 2023-06-28 DIAGNOSIS — I255 Ischemic cardiomyopathy: Secondary | ICD-10-CM | POA: Diagnosis not present

## 2023-06-28 DIAGNOSIS — I5021 Acute systolic (congestive) heart failure: Secondary | ICD-10-CM | POA: Diagnosis not present

## 2023-06-28 DIAGNOSIS — Z8249 Family history of ischemic heart disease and other diseases of the circulatory system: Secondary | ICD-10-CM | POA: Diagnosis not present

## 2023-06-28 DIAGNOSIS — K920 Hematemesis: Secondary | ICD-10-CM | POA: Diagnosis not present

## 2023-06-28 DIAGNOSIS — I517 Cardiomegaly: Secondary | ICD-10-CM | POA: Diagnosis not present

## 2023-06-28 DIAGNOSIS — I4901 Ventricular fibrillation: Secondary | ICD-10-CM | POA: Diagnosis not present

## 2023-06-28 DIAGNOSIS — R195 Other fecal abnormalities: Secondary | ICD-10-CM | POA: Diagnosis not present

## 2023-06-28 DIAGNOSIS — R570 Cardiogenic shock: Secondary | ICD-10-CM | POA: Diagnosis not present

## 2023-06-28 LAB — BASIC METABOLIC PANEL WITH GFR
Anion gap: 12 (ref 5–15)
BUN: 37 mg/dL — ABNORMAL HIGH (ref 8–23)
CO2: 23 mmol/L (ref 22–32)
Calcium: 8.7 mg/dL — ABNORMAL LOW (ref 8.9–10.3)
Chloride: 98 mmol/L (ref 98–111)
Creatinine, Ser: 1.71 mg/dL — ABNORMAL HIGH (ref 0.44–1.00)
GFR, Estimated: 28 mL/min — ABNORMAL LOW (ref >60)
Glucose, Bld: 102 mg/dL — ABNORMAL HIGH (ref 70–99)
Potassium: 4.1 mmol/L (ref 3.5–5.1)
Sodium: 133 mmol/L — ABNORMAL LOW (ref 135–145)

## 2023-06-28 LAB — CBC
HCT: 28.4 % — ABNORMAL LOW (ref 36.0–46.0)
Hemoglobin: 9.3 g/dL — ABNORMAL LOW (ref 12.0–15.0)
MCH: 34.2 pg — ABNORMAL HIGH (ref 26.0–34.0)
MCHC: 32.7 g/dL (ref 30.0–36.0)
MCV: 104.4 fL — ABNORMAL HIGH (ref 80.0–100.0)
Platelets: 292 10*3/uL (ref 150–400)
RBC: 2.72 MIL/uL — ABNORMAL LOW (ref 3.87–5.11)
RDW: 14.1 % (ref 11.5–15.5)
WBC: 6.8 10*3/uL (ref 4.0–10.5)
nRBC: 0.3 % — ABNORMAL HIGH (ref 0.0–0.2)

## 2023-06-28 LAB — MAGNESIUM: Magnesium: 1.9 mg/dL (ref 1.7–2.4)

## 2023-06-28 MED ORDER — AMIODARONE HCL 400 MG PO TABS: ORAL_TABLET | ORAL | Status: DC

## 2023-06-28 MED ORDER — CLOPIDOGREL BISULFATE 75 MG PO TABS: 75.0000 mg | ORAL_TABLET | Freq: Every day | ORAL | Status: DC

## 2023-06-28 MED ORDER — SPIRONOLACTONE 25 MG PO TABS: 25.0000 mg | ORAL_TABLET | Freq: Every day | ORAL | Status: DC

## 2023-06-28 MED ORDER — POTASSIUM CHLORIDE CRYS ER 20 MEQ PO TBCR: 20.0000 meq | EXTENDED_RELEASE_TABLET | Freq: Every day | ORAL | Status: DC

## 2023-06-28 MED ORDER — LIDOCAINE 5 % EX PTCH: 1.0000 | MEDICATED_PATCH | CUTANEOUS | Status: DC

## 2023-06-28 MED ORDER — MEXILETINE HCL 150 MG PO CAPS
150.0000 mg | ORAL_CAPSULE | Freq: Three times a day (TID) | ORAL | Status: DC
Start: 2023-06-28 — End: 2023-08-24

## 2023-06-28 MED ORDER — FUROSEMIDE 40 MG PO TABS: 40.0000 mg | ORAL_TABLET | Freq: Every day | ORAL | Status: DC

## 2023-06-28 MED ORDER — PANTOPRAZOLE SODIUM 40 MG PO TBEC: 40.0000 mg | DELAYED_RELEASE_TABLET | Freq: Every day | ORAL | Status: DC

## 2023-06-28 MED ORDER — ACETAMINOPHEN 500 MG PO TABS
1000.0000 mg | ORAL_TABLET | Freq: Four times a day (QID) | ORAL | 2 refills | Status: DC | PRN
  Filled 2023-06-28: qty 100, 13d supply, fill #0

## 2023-06-28 MED ORDER — MAGNESIUM SULFATE 2 GM/50ML IV SOLN
2.0000 g | Freq: Once | INTRAVENOUS | Status: AC
  Administered 2023-06-28: 2 g via INTRAVENOUS
  Filled 2023-06-28: qty 50

## 2023-06-28 NOTE — Progress Notes (Signed)
 Occupational Therapy Treatment Patient Details Name: [name] MRN: [id number] DOB: 26-Dec-1931 Today's Date: 06/28/2023   History of present illness Patient is an 89 y/o female admitted 06/19/23 with chest pain found to have STEMI and underwent cath found to have 99% pLAD s/p DES on 6/15, developed long runs of NSVT 6/16 started on Amio and sustained VT/VF lost pulse overnight 6/17 had 1 round CPR and 300mg  amio bolus with ROSC and conversion to NSR.  Work up for [employer] ongoing. PMH: hyperlipidemia, SVT, PAF, severe AS s/p advert SAPIEN 3 bioprosthetic TAVR 2018, mild nonobstructive CAD.   OT comments  Pt making good progress towards OT goals. Session focused on [facility] transfers via [facility] w/ pt able to pull self to standing with Min A. Due to strength and balance deficits, pt continues to require extensive assist for LB ADLs but gradual improvements noted. Cognition also appears to be improving from initial OT eval. Based on assistance required and below functional baseline, continue to recommend inpatient follow up therapy, <3 hours/day at DC.  SpO2 93% on RA, HR 60-70s      If plan is discharge home, recommend the following:  Two people to help with walking and/or transfers;A lot of help with bathing/dressing/bathroom;Assistance with cooking/housework;Direct supervision/assist for medications management;Assist for transportation;Help with stairs or ramp for entrance   Equipment Recommendations  Other (comment) (TBD)    Recommendations for Other Services      Precautions / Restrictions Precautions Precautions: Fall Precaution/Restrictions Comments: watch HR, O2 Restrictions Weight Bearing Restrictions Per Provider Order: No       Mobility Bed Mobility Overal bed mobility: Needs Assistance Bed Mobility: Supine to Sit     Supine to sit: Mod assist, HOB elevated, Used rails     General bed mobility comments: cues for bedrail use, light assist for LE to EOB and assist to  lift trunk/scoot to EOB    Transfers Overall transfer level: Needs assistance Equipment used: Ambulation equipment used Transfers: Sit to/from Stand, Bed to chair/wheelchair/BSC Sit to Stand: Min assist           General transfer comment: Min A to stand in [place] from bedside and from [facility]. transferred to recliner via Stedy at end of session Transfer via Lift Equipment: [employer] Overall balance assessment: Needs assistance Sitting-balance support: Bilateral upper extremity supported, Feet supported Sitting balance-Leahy Scale: Poor Sitting balance - Comments: Light Min A to correct balance, use of BUE to maintain balance EOB fairly well Postural control: Posterior lean Standing balance support: Bilateral upper extremity supported Standing balance-Leahy Scale: Poor                             ADL either performed or assessed with clinical judgement   ADL Overall ADL's : Needs assistance/impaired                         Toilet Transfer: Agricultural engineer Details (indicate cue type and reason): use of Stedy for transfer to/from [facility] with Min A Toileting- Clothing Manipulation and Hygiene: Total assistance;Sit to/from stand Toileting - Clothing Manipulation Details (indicate cue type and reason): Total A for peri care standing in [facility])            Extremity/Trunk Assessment Upper Extremity Assessment Upper Extremity Assessment: Generalized weakness;Right hand dominant   Lower Extremity Assessment Lower Extremity Assessment: Defer to PT evaluation  Vision   Vision Assessment?: No apparent visual deficits   Perception     Praxis     Communication Communication Communication: Impaired Factors Affecting Communication: Hearing impaired   Cognition Arousal: Alert Behavior During Therapy: WFL for tasks assessed/performed Cognition: No family/caregiver present to determine baseline             OT - Cognition Comments:  improving cognition from initial evaluation, appropriate in conversation, appears HOH so repetition needed.                 Following commands: Intact        Cueing   Cueing Techniques: Verbal cues  Exercises      Shoulder Instructions       General Comments Friend at bedside, supportive    Pertinent Vitals/ Pain       Pain Assessment Pain Assessment: Faces Faces Pain Scale: Hurts a little bit Pain Location: chest from CPR Pain Descriptors / Indicators: Sore Pain Intervention(s): Monitored during session, Patient requesting pain meds-RN notified  Home Living                                          Prior Functioning/Environment              Frequency  Min 2X/week        Progress Toward Goals  OT Goals(current goals can now be found in the care plan section)  Progress towards OT goals: Progressing toward goals  Acute Rehab OT Goals Patient Stated Goal: get stronger OT Goal Formulation: With patient/family Time For Goal Achievement: 07/08/23 Potential to Achieve Goals: Good ADL Goals Pt Will Perform Lower Body Bathing: with min assist;sit to/from stand;with adaptive equipment Pt Will Perform Lower Body Dressing: with min assist;sit to/from stand;with adaptive equipment Pt Will Transfer to Toilet: with min assist;ambulating Additional ADL Goal #1: Pt will verbalize use of 3 energy conservation stratiegs for ADL with min VC  Plan      Co-evaluation                 AM-PAC OT 6 Clicks Daily Activity     Outcome Measure   Help from another person eating meals?: None Help from another person taking care of personal grooming?: A Little Help from another person toileting, which includes using toliet, bedpan, or urinal?: Total Help from another person bathing (including washing, rinsing, drying)?: A Lot Help from another person to put on and taking off regular upper body clothing?: A Little Help from another person to put on  and taking off regular lower body clothing?: A Lot 6 Click Score: 15    End of Session Equipment Utilized During Treatment: Gait belt  OT Visit Diagnosis: Unsteadiness on feet (R26.81);Other abnormalities of gait and mobility (R26.89);Muscle weakness (generalized) (M62.81)   Activity Tolerance Patient tolerated treatment well   Patient Left in chair;with call bell/phone within reach;with chair alarm set;with family/visitor present   Nurse Communication Mobility status;Patient requests pain meds;Other (comment) (purewick replacement)        Time: 1002-1030 OT Time Calculation (min): 28 min  Charges: OT General Charges $OT Visit: 1 Visit OT Treatments $Self Care/Home Management : 8-22 mins $Therapeutic Activity: 8-22 mins  Mliss NOVAK, OTR/L Acute Rehab Services Office: [phone]   Mliss Fish 06/28/2023, 11:03 AM

## 2023-06-28 NOTE — Progress Notes (Signed)
 Report called to [facility] facility. PIVs removed prior to discharge. AVS completed.

## 2023-06-28 NOTE — Progress Notes (Signed)
 Inpatient Rehab Admissions Coordinator:    Pt. Discharging to SNF. I will discontinue CIR Consult.   Leita Kleine, MS, CCC-SLP Rehab Admissions Coordinator  [phone] (celll) [phone] (office)

## 2023-06-28 NOTE — Plan of Care (Signed)
  Problem: Education: Goal: Knowledge of General Education information will improve Description: Including pain rating scale, medication(s)/side effects and non-pharmacologic comfort measures Outcome: Adequate for Discharge   Problem: Health Behavior/Discharge Planning: Goal: Ability to manage health-related needs will improve Outcome: Adequate for Discharge   Problem: Clinical Measurements: Goal: Ability to maintain clinical measurements within normal limits will improve Outcome: Adequate for Discharge Goal: Will remain free from infection Outcome: Adequate for Discharge Goal: Diagnostic test results will improve Outcome: Adequate for Discharge Goal: Respiratory complications will improve Outcome: Adequate for Discharge Goal: Cardiovascular complication will be avoided Outcome: Adequate for Discharge   Problem: Activity: Goal: Risk for activity intolerance will decrease Outcome: Adequate for Discharge   Problem: Nutrition: Goal: Adequate nutrition will be maintained 06/28/2023 1306 by Charlann Lucie CROME, RN Outcome: Adequate for Discharge 06/28/2023 0951 by Charlann Lucie CROME, RN Outcome: Progressing   Problem: Coping: Goal: Level of anxiety will decrease 06/28/2023 1306 by Charlann Lucie CROME, RN Outcome: Adequate for Discharge 06/28/2023 0951 by Charlann Lucie CROME, RN Outcome: Progressing   Problem: Elimination: Goal: Will not experience complications related to bowel motility 06/28/2023 1306 by Charlann Lucie CROME, RN Outcome: Adequate for Discharge 06/28/2023 0951 by Charlann Lucie CROME, RN Outcome: Progressing Goal: Will not experience complications related to urinary retention 06/28/2023 1306 by Charlann Lucie CROME, RN Outcome: Adequate for Discharge 06/28/2023 0951 by Charlann Lucie CROME, RN Outcome: Progressing   Problem: Pain Managment: Goal: General experience of comfort will improve and/or be controlled 06/28/2023 1306 by Charlann Lucie CROME, RN Outcome: Adequate for  Discharge 06/28/2023 0951 by Charlann Lucie CROME, RN Outcome: Progressing   Problem: Safety: Goal: Ability to remain free from injury will improve 06/28/2023 1306 by Charlann Lucie CROME, RN Outcome: Adequate for Discharge 06/28/2023 0951 by Charlann Lucie CROME, RN Outcome: Progressing   Problem: Skin Integrity: Goal: Risk for impaired skin integrity will decrease 06/28/2023 1306 by Charlann Lucie CROME, RN Outcome: Adequate for Discharge 06/28/2023 0951 by Charlann Lucie CROME, RN Outcome: Progressing   Problem: Education: Goal: Understanding of CV disease, CV risk reduction, and recovery process will improve Outcome: Adequate for Discharge Goal: Individualized Educational Video(s) Outcome: Adequate for Discharge   Problem: Activity: Goal: Ability to return to baseline activity level will improve Outcome: Adequate for Discharge   Problem: Cardiovascular: Goal: Ability to achieve and maintain adequate cardiovascular perfusion will improve Outcome: Adequate for Discharge Goal: Vascular access site(s) Level 0-1 will be maintained Outcome: Adequate for Discharge   Problem: Health Behavior/Discharge Planning: Goal: Ability to safely manage health-related needs after discharge will improve Outcome: Adequate for Discharge

## 2023-06-28 NOTE — Plan of Care (Signed)

## 2023-06-28 NOTE — TOC Transition Note (Addendum)
 Transition of Care [facility]) - Discharge Note   Patient Details  Name: [name] MRN: [id number] Date of Birth: Mar 24, 1931  Transition of Care [facility]) CM/SW Contact:  Arlana JINNY Nicholaus [place] Phone Number: [phone] 06/28/2023, 8:50 AM   Clinical Narrative:  Patient will be dc to [facility] today. CSW notified bedside RN via secure chat.   Patients room #208. The number to call for report [phone].  PTAR paperwork left on the chart.   12:42 pm- HF CSW called the patients daughter and left a VM to share updates and let her know that PTAR was being called as soon as bedside RN says she is ready.   1:20 PM- PTAR was called at.       Barriers to Discharge: Continued Medical Work up   Patient Goals and CMS Choice Patient states their goals for this hospitalization and ongoing recovery are:: wants to get better and return home CMS Medicare.gov Compare Post Acute Care list provided to:: Patient Represenative (must comment) Choice offered to / list presented to : Adult Children      Discharge Placement                       Discharge Plan and Services Additional resources added to the After Visit Summary for     Discharge Planning Services: CM Consult Post Acute Care Choice: Home Health                               Social Drivers of Health (SDOH) Interventions SDOH Screenings   Food Insecurity: No Food Insecurity (06/20/2023)  Housing: Low Risk  (06/20/2023)  Transportation Needs: No Transportation Needs (06/20/2023)  Utilities: Not At Risk (06/20/2023)  Social Connections: Moderately Isolated (06/20/2023)  Tobacco Use: Low Risk  (06/23/2023)     Readmission Risk Interventions     No data to display

## 2023-07-01 ENCOUNTER — Encounter (HOSPITAL_COMMUNITY): Payer: Self-pay

## 2023-07-01 ENCOUNTER — Other Ambulatory Visit: Payer: Self-pay

## 2023-07-01 ENCOUNTER — Emergency Department (HOSPITAL_COMMUNITY)

## 2023-07-01 ENCOUNTER — Inpatient Hospital Stay (HOSPITAL_COMMUNITY)
Admission: EM | Admit: 2023-07-01 | Discharge: 2023-07-04 | DRG: 377 | Disposition: A | Discharge status: Skilled Nursing Facility | Source: Skilled Nursing Facility | Attending: Internal Medicine | Admitting: Internal Medicine

## 2023-07-01 DIAGNOSIS — I2489 Other forms of acute ischemic heart disease: Secondary | ICD-10-CM | POA: Diagnosis not present

## 2023-07-01 DIAGNOSIS — Z9841 Cataract extraction status, right eye: Secondary | ICD-10-CM

## 2023-07-01 DIAGNOSIS — I2102 ST elevation (STEMI) myocardial infarction involving left anterior descending coronary artery: Secondary | ICD-10-CM | POA: Diagnosis present

## 2023-07-01 DIAGNOSIS — Z7902 Long term (current) use of antithrombotics/antiplatelets: Secondary | ICD-10-CM | POA: Diagnosis not present

## 2023-07-01 DIAGNOSIS — I4901 Ventricular fibrillation: Secondary | ICD-10-CM | POA: Diagnosis present

## 2023-07-01 DIAGNOSIS — I5023 Acute on chronic systolic (congestive) heart failure: Secondary | ICD-10-CM | POA: Diagnosis not present

## 2023-07-01 DIAGNOSIS — R0902 Hypoxemia: Secondary | ICD-10-CM | POA: Diagnosis not present

## 2023-07-01 DIAGNOSIS — I4821 Permanent atrial fibrillation: Secondary | ICD-10-CM | POA: Diagnosis not present

## 2023-07-01 DIAGNOSIS — Z853 Personal history of malignant neoplasm of breast: Secondary | ICD-10-CM

## 2023-07-01 DIAGNOSIS — N1832 Chronic kidney disease, stage 3b: Secondary | ICD-10-CM | POA: Diagnosis not present

## 2023-07-01 DIAGNOSIS — I2510 Atherosclerotic heart disease of native coronary artery without angina pectoris: Secondary | ICD-10-CM | POA: Diagnosis present

## 2023-07-01 DIAGNOSIS — K922 Gastrointestinal hemorrhage, unspecified: Principal | ICD-10-CM | POA: Diagnosis present

## 2023-07-01 DIAGNOSIS — I255 Ischemic cardiomyopathy: Secondary | ICD-10-CM | POA: Diagnosis not present

## 2023-07-01 DIAGNOSIS — Z955 Presence of coronary angioplasty implant and graft: Secondary | ICD-10-CM | POA: Diagnosis not present

## 2023-07-01 DIAGNOSIS — I700 Atherosclerosis of aorta: Secondary | ICD-10-CM | POA: Diagnosis not present

## 2023-07-01 DIAGNOSIS — Z8619 Personal history of other infectious and parasitic diseases: Secondary | ICD-10-CM

## 2023-07-01 DIAGNOSIS — M19042 Primary osteoarthritis, left hand: Secondary | ICD-10-CM | POA: Diagnosis present

## 2023-07-01 DIAGNOSIS — Z96641 Presence of right artificial hip joint: Secondary | ICD-10-CM | POA: Diagnosis present

## 2023-07-01 DIAGNOSIS — I480 Paroxysmal atrial fibrillation: Secondary | ICD-10-CM | POA: Diagnosis not present

## 2023-07-01 DIAGNOSIS — Z8711 Personal history of peptic ulcer disease: Secondary | ICD-10-CM

## 2023-07-01 DIAGNOSIS — Z7401 Bed confinement status: Secondary | ICD-10-CM | POA: Diagnosis not present

## 2023-07-01 DIAGNOSIS — E039 Hypothyroidism, unspecified: Secondary | ICD-10-CM | POA: Diagnosis present

## 2023-07-01 DIAGNOSIS — M19041 Primary osteoarthritis, right hand: Secondary | ICD-10-CM | POA: Diagnosis present

## 2023-07-01 DIAGNOSIS — M6281 Muscle weakness (generalized): Secondary | ICD-10-CM | POA: Diagnosis not present

## 2023-07-01 DIAGNOSIS — E871 Hypo-osmolality and hyponatremia: Secondary | ICD-10-CM | POA: Diagnosis not present

## 2023-07-01 DIAGNOSIS — D631 Anemia in chronic kidney disease: Secondary | ICD-10-CM | POA: Diagnosis not present

## 2023-07-01 DIAGNOSIS — N179 Acute kidney failure, unspecified: Secondary | ICD-10-CM | POA: Diagnosis not present

## 2023-07-01 DIAGNOSIS — I959 Hypotension, unspecified: Secondary | ICD-10-CM | POA: Diagnosis present

## 2023-07-01 DIAGNOSIS — N183 Chronic kidney disease, stage 3 unspecified: Secondary | ICD-10-CM | POA: Diagnosis not present

## 2023-07-01 DIAGNOSIS — I10 Essential (primary) hypertension: Secondary | ICD-10-CM | POA: Diagnosis not present

## 2023-07-01 DIAGNOSIS — Z8679 Personal history of other diseases of the circulatory system: Secondary | ICD-10-CM | POA: Diagnosis not present

## 2023-07-01 DIAGNOSIS — R0602 Shortness of breath: Secondary | ICD-10-CM | POA: Diagnosis not present

## 2023-07-01 DIAGNOSIS — E785 Hyperlipidemia, unspecified: Secondary | ICD-10-CM | POA: Diagnosis present

## 2023-07-01 DIAGNOSIS — Z7901 Long term (current) use of anticoagulants: Secondary | ICD-10-CM

## 2023-07-01 DIAGNOSIS — Z96651 Presence of right artificial knee joint: Secondary | ICD-10-CM | POA: Diagnosis present

## 2023-07-01 DIAGNOSIS — R58 Hemorrhage, not elsewhere classified: Secondary | ICD-10-CM | POA: Diagnosis not present

## 2023-07-01 DIAGNOSIS — I272 Pulmonary hypertension, unspecified: Secondary | ICD-10-CM | POA: Diagnosis present

## 2023-07-01 DIAGNOSIS — K920 Hematemesis: Principal | ICD-10-CM | POA: Diagnosis present

## 2023-07-01 DIAGNOSIS — I5021 Acute systolic (congestive) heart failure: Secondary | ICD-10-CM | POA: Diagnosis not present

## 2023-07-01 DIAGNOSIS — Z8249 Family history of ischemic heart disease and other diseases of the circulatory system: Secondary | ICD-10-CM | POA: Diagnosis not present

## 2023-07-01 DIAGNOSIS — N189 Chronic kidney disease, unspecified: Secondary | ICD-10-CM | POA: Diagnosis not present

## 2023-07-01 DIAGNOSIS — Z961 Presence of intraocular lens: Secondary | ICD-10-CM | POA: Diagnosis present

## 2023-07-01 DIAGNOSIS — Z96643 Presence of artificial hip joint, bilateral: Secondary | ICD-10-CM | POA: Diagnosis present

## 2023-07-01 DIAGNOSIS — M1712 Unilateral primary osteoarthritis, left knee: Secondary | ICD-10-CM | POA: Diagnosis not present

## 2023-07-01 DIAGNOSIS — I472 Ventricular tachycardia, unspecified: Secondary | ICD-10-CM | POA: Diagnosis not present

## 2023-07-01 DIAGNOSIS — R195 Other fecal abnormalities: Secondary | ICD-10-CM | POA: Diagnosis not present

## 2023-07-01 DIAGNOSIS — R0989 Other specified symptoms and signs involving the circulatory and respiratory systems: Secondary | ICD-10-CM | POA: Diagnosis not present

## 2023-07-01 DIAGNOSIS — R001 Bradycardia, unspecified: Secondary | ICD-10-CM | POA: Diagnosis not present

## 2023-07-01 DIAGNOSIS — Z66 Do not resuscitate: Secondary | ICD-10-CM | POA: Diagnosis present

## 2023-07-01 DIAGNOSIS — I5042 Chronic combined systolic (congestive) and diastolic (congestive) heart failure: Secondary | ICD-10-CM | POA: Diagnosis not present

## 2023-07-01 DIAGNOSIS — Z952 Presence of prosthetic heart valve: Secondary | ICD-10-CM | POA: Diagnosis not present

## 2023-07-01 DIAGNOSIS — Z9842 Cataract extraction status, left eye: Secondary | ICD-10-CM

## 2023-07-01 DIAGNOSIS — I499 Cardiac arrhythmia, unspecified: Secondary | ICD-10-CM | POA: Diagnosis not present

## 2023-07-01 DIAGNOSIS — Z79890 Hormone replacement therapy (postmenopausal): Secondary | ICD-10-CM | POA: Diagnosis not present

## 2023-07-01 DIAGNOSIS — J90 Pleural effusion, not elsewhere classified: Secondary | ICD-10-CM | POA: Diagnosis not present

## 2023-07-01 DIAGNOSIS — Z79899 Other long term (current) drug therapy: Secondary | ICD-10-CM | POA: Diagnosis not present

## 2023-07-01 DIAGNOSIS — M810 Age-related osteoporosis without current pathological fracture: Secondary | ICD-10-CM | POA: Diagnosis present

## 2023-07-01 DIAGNOSIS — E875 Hyperkalemia: Secondary | ICD-10-CM | POA: Diagnosis not present

## 2023-07-01 DIAGNOSIS — K219 Gastro-esophageal reflux disease without esophagitis: Secondary | ICD-10-CM | POA: Diagnosis not present

## 2023-07-01 DIAGNOSIS — I517 Cardiomegaly: Secondary | ICD-10-CM | POA: Diagnosis not present

## 2023-07-01 DIAGNOSIS — I4510 Unspecified right bundle-branch block: Secondary | ICD-10-CM | POA: Diagnosis present

## 2023-07-01 DIAGNOSIS — R2681 Unsteadiness on feet: Secondary | ICD-10-CM | POA: Diagnosis not present

## 2023-07-01 DIAGNOSIS — R570 Cardiogenic shock: Secondary | ICD-10-CM | POA: Diagnosis not present

## 2023-07-01 DIAGNOSIS — R609 Edema, unspecified: Secondary | ICD-10-CM | POA: Diagnosis not present

## 2023-07-01 DIAGNOSIS — R0600 Dyspnea, unspecified: Secondary | ICD-10-CM | POA: Diagnosis not present

## 2023-07-01 DIAGNOSIS — R278 Other lack of coordination: Secondary | ICD-10-CM | POA: Diagnosis not present

## 2023-07-01 DIAGNOSIS — R41841 Cognitive communication deficit: Secondary | ICD-10-CM | POA: Diagnosis not present

## 2023-07-01 DIAGNOSIS — K5900 Constipation, unspecified: Secondary | ICD-10-CM | POA: Diagnosis not present

## 2023-07-01 LAB — COMPREHENSIVE METABOLIC PANEL WITH GFR
ALT: 24 U/L (ref 0–44)
AST: 36 U/L (ref 15–41)
Albumin: 2.9 g/dL — ABNORMAL LOW (ref 3.5–5.0)
Alkaline Phosphatase: 84 U/L (ref 38–126)
Anion gap: 11 (ref 5–15)
BUN: 41 mg/dL — ABNORMAL HIGH (ref 8–23)
CO2: 20 mmol/L — ABNORMAL LOW (ref 22–32)
Calcium: 8.6 mg/dL — ABNORMAL LOW (ref 8.9–10.3)
Chloride: 98 mmol/L (ref 98–111)
Creatinine, Ser: 1.97 mg/dL — ABNORMAL HIGH (ref 0.44–1.00)
GFR, Estimated: 24 mL/min — ABNORMAL LOW (ref >60)
Glucose, Bld: 127 mg/dL — ABNORMAL HIGH (ref 70–99)
Potassium: 5.3 mmol/L — ABNORMAL HIGH (ref 3.5–5.1)
Sodium: 129 mmol/L — ABNORMAL LOW (ref 135–145)
Total Bilirubin: 1.1 mg/dL (ref 0.0–1.2)
Total Protein: 6.5 g/dL (ref 6.5–8.1)

## 2023-07-01 LAB — TROPONIN I (HIGH SENSITIVITY): Troponin I (High Sensitivity): 82 ng/L — ABNORMAL HIGH (ref <18)

## 2023-07-01 LAB — BRAIN NATRIURETIC PEPTIDE: B Natriuretic Peptide: 4445.8 pg/mL — ABNORMAL HIGH (ref 0.0–100.0)

## 2023-07-01 LAB — CBC WITH DIFFERENTIAL/PLATELET
Abs Immature Granulocytes: 0.1 10*3/uL — ABNORMAL HIGH (ref 0.00–0.07)
Basophils Absolute: 0 10*3/uL (ref 0.0–0.1)
Basophils Relative: 0 %
Eosinophils Absolute: 0.2 10*3/uL (ref 0.0–0.5)
Eosinophils Relative: 2 %
HCT: 31 % — ABNORMAL LOW (ref 36.0–46.0)
Hemoglobin: 10 g/dL — ABNORMAL LOW (ref 12.0–15.0)
Immature Granulocytes: 1 %
Lymphocytes Relative: 9 %
Lymphs Abs: 0.9 10*3/uL (ref 0.7–4.0)
MCH: 35.1 pg — ABNORMAL HIGH (ref 26.0–34.0)
MCHC: 32.3 g/dL (ref 30.0–36.0)
MCV: 108.8 fL — ABNORMAL HIGH (ref 80.0–100.0)
Monocytes Absolute: 0.9 10*3/uL (ref 0.1–1.0)
Monocytes Relative: 10 %
Neutro Abs: 7.2 10*3/uL (ref 1.7–7.7)
Neutrophils Relative %: 78 %
Platelets: 338 10*3/uL (ref 150–400)
RBC: 2.85 MIL/uL — ABNORMAL LOW (ref 3.87–5.11)
RDW: 15.8 % — ABNORMAL HIGH (ref 11.5–15.5)
WBC: 9.2 10*3/uL (ref 4.0–10.5)
nRBC: 1.3 % — ABNORMAL HIGH (ref 0.0–0.2)

## 2023-07-01 LAB — PREPARE RBC (CROSSMATCH)

## 2023-07-01 LAB — POC OCCULT BLOOD, ED: Fecal Occult Bld: POSITIVE — AB

## 2023-07-01 MED ORDER — SODIUM CHLORIDE 0.9% IV SOLUTION: Freq: Once | INTRAVENOUS | Status: DC

## 2023-07-01 MED ORDER — ONDANSETRON HCL 4 MG/2ML IJ SOLN
4.0000 mg | Freq: Four times a day (QID) | INTRAMUSCULAR | Status: DC | PRN
Start: 2023-07-01 — End: 2023-07-04

## 2023-07-01 MED ORDER — ONDANSETRON HCL 4 MG PO TABS: 4.0000 mg | ORAL_TABLET | Freq: Four times a day (QID) | ORAL | Status: DC | PRN

## 2023-07-01 MED ORDER — SODIUM CHLORIDE 0.9% FLUSH: 3.0000 mL | INTRAVENOUS | Status: DC | PRN

## 2023-07-01 MED ORDER — VITAMIN C 500 MG PO TABS
500.0000 mg | ORAL_TABLET | Freq: Every day | ORAL | Status: DC
  Administered 2023-07-02 – 2023-07-04 (×3): 500 mg via ORAL
  Filled 2023-07-01 (×3): qty 1

## 2023-07-01 MED ORDER — ACETAMINOPHEN 650 MG RE SUPP
650.0000 mg | Freq: Four times a day (QID) | RECTAL | Status: DC | PRN
Start: 2023-07-01 — End: 2023-07-04

## 2023-07-01 MED ORDER — LEVOTHYROXINE SODIUM 112 MCG PO TABS
112.0000 ug | ORAL_TABLET | Freq: Every day | ORAL | Status: DC
  Administered 2023-07-02 – 2023-07-04 (×3): 112 ug via ORAL
  Filled 2023-07-01 (×3): qty 1

## 2023-07-01 MED ORDER — SPIRONOLACTONE 25 MG PO TABS
25.0000 mg | ORAL_TABLET | Freq: Every day | ORAL | Status: DC
  Administered 2023-07-02 – 2023-07-03 (×2): 25 mg via ORAL
  Filled 2023-07-01 (×2): qty 1

## 2023-07-01 MED ORDER — MEXILETINE HCL 150 MG PO CAPS
150.0000 mg | ORAL_CAPSULE | Freq: Three times a day (TID) | ORAL | Status: DC
  Administered 2023-07-02 – 2023-07-04 (×8): 150 mg via ORAL
  Filled 2023-07-01 (×10): qty 1

## 2023-07-01 MED ORDER — PANTOPRAZOLE SODIUM 40 MG IV SOLR
40.0000 mg | Freq: Once | INTRAVENOUS | Status: AC
  Administered 2023-07-02: 40 mg via INTRAVENOUS
  Filled 2023-07-01: qty 10

## 2023-07-01 MED ORDER — DEXTROSE 50 % IV SOLN
1.0000 | Freq: Once | INTRAVENOUS | Status: DC
  Filled 2023-07-01: qty 50

## 2023-07-01 MED ORDER — CLOPIDOGREL BISULFATE 75 MG PO TABS
75.0000 mg | ORAL_TABLET | Freq: Every day | ORAL | Status: DC
Start: 2023-07-02 — End: 2023-07-04
  Administered 2023-07-02 – 2023-07-04 (×3): 75 mg via ORAL
  Filled 2023-07-01 (×3): qty 1

## 2023-07-01 MED ORDER — AMIODARONE HCL 200 MG PO TABS: 400.0000 mg | ORAL_TABLET | Freq: Two times a day (BID) | ORAL | Status: DC

## 2023-07-01 MED ORDER — PANTOPRAZOLE SODIUM 40 MG IV SOLR
40.0000 mg | Freq: Two times a day (BID) | INTRAVENOUS | Status: DC
  Administered 2023-07-02 – 2023-07-04 (×5): 40 mg via INTRAVENOUS
  Filled 2023-07-01 (×5): qty 10

## 2023-07-01 MED ORDER — INSULIN ASPART 100 UNIT/ML IV SOLN: 10.0000 [IU] | Freq: Once | INTRAVENOUS | Status: DC

## 2023-07-01 MED ORDER — CALCIUM GLUCONATE-NACL 1-0.675 GM/50ML-% IV SOLN
1.0000 g | Freq: Once | INTRAVENOUS | Status: AC
  Administered 2023-07-02: 1000 mg via INTRAVENOUS
  Filled 2023-07-01: qty 50

## 2023-07-01 MED ORDER — PANTOPRAZOLE SODIUM 40 MG IV SOLR
40.0000 mg | Freq: Once | INTRAVENOUS | Status: AC
  Administered 2023-07-01: 40 mg via INTRAVENOUS
  Filled 2023-07-01: qty 10

## 2023-07-01 MED ORDER — ACETAMINOPHEN 325 MG PO TABS
650.0000 mg | ORAL_TABLET | Freq: Four times a day (QID) | ORAL | Status: DC | PRN
  Administered 2023-07-02 – 2023-07-03 (×2): 650 mg via ORAL
  Filled 2023-07-01 (×2): qty 2

## 2023-07-01 MED ORDER — FUROSEMIDE 10 MG/ML IJ SOLN
20.0000 mg | Freq: Once | INTRAMUSCULAR | Status: AC
  Administered 2023-07-02: 20 mg via INTRAVENOUS
  Filled 2023-07-01: qty 2

## 2023-07-01 MED ORDER — SODIUM CHLORIDE 0.9% FLUSH
3.0000 mL | Freq: Two times a day (BID) | INTRAVENOUS | Status: DC
  Administered 2023-07-02 – 2023-07-03 (×5): 3 mL via INTRAVENOUS

## 2023-07-01 MED ORDER — SODIUM ZIRCONIUM CYCLOSILICATE 5 G PO PACK
5.0000 g | PACK | Freq: Once | ORAL | Status: AC
  Administered 2023-07-02: 5 g via ORAL
  Filled 2023-07-01: qty 1

## 2023-07-01 MED ORDER — POTASSIUM CHLORIDE CRYS ER 20 MEQ PO TBCR
20.0000 meq | EXTENDED_RELEASE_TABLET | Freq: Every day | ORAL | Status: DC
  Administered 2023-07-02: 20 meq via ORAL
  Filled 2023-07-01: qty 1

## 2023-07-01 MED ORDER — SODIUM CHLORIDE 0.9% FLUSH
3.0000 mL | Freq: Two times a day (BID) | INTRAVENOUS | Status: DC
  Administered 2023-07-02 – 2023-07-04 (×5): 3 mL via INTRAVENOUS

## 2023-07-01 MED ORDER — SODIUM CHLORIDE 0.9 % IV SOLN
250.0000 mL | INTRAVENOUS | Status: AC | PRN
Start: 2023-07-01 — End: 2023-07-02

## 2023-07-01 NOTE — ED Triage Notes (Signed)
 Pt to ED BIB EMS from [facility] with complaint of coffee ground emesis that began this evening - was initially c/o nausea & abd pain but subsided after vomiting. SHOB that began yesterday.   Admitted to [facility] on 6/24 for rehab s/p STEMI on 6/15.

## 2023-07-01 NOTE — ED Provider Notes (Signed)
  EMERGENCY DEPARTMENT AT [facility] Provider Note  CSN: [id number] Arrival date & time: 07/01/23 2131  Chief Complaint(s) GI Bleeding  HPI [name] is a 89 y.o. female with PMH CAD status post STEMI on 06/28/2023 status post DES placement, CHF, cardiogenic shock with ventricular tachycardia on Plavix  and Xarelto  who presents emergency room for evaluation of shortness of breath dark stools and hematemesis.  Patient was recently discharged to nursing facility for hospital rehab and she was working with physical therapy today.  She noticed that she had significant decreased exercise tolerance and felt very short of breath.  Patient hypoxic in the field and arrives on 2 L nasal cannula.  States that she is seeing dark stools and had an episode of coffee-ground emesis in the facility.  Denies abdominal pain, nausea, headache, fever or other systemic symptoms.   Past Medical History Past Medical History:  Diagnosis Date   Arthritis    hands, knees   CAD (coronary artery disease), native coronary artery cardiologist--- dr verlin   a. mild non obst CAD per cath 07-18-2015   Carotid artery disease (HCC)    Carotid US  09/2018: Bilateral ICA 40-59 // Carotid US  11/21: Bilateral ICA 40-59; repeat 1 year    Chronic diastolic CHF (congestive heart failure) (HCC)    followed by cardiology   CKD (chronic kidney disease), stage III (HCC)    followed by pcp   Dyspnea    per pt when she walks which is usual for her   History of gastric ulcer    remote yrs ago   History of rheumatic fever as a child    History of right breast cancer 2006   s/p right partial mastectomy w/ node dissection's,  completed chemo/ radiation same year  (05-21-2020 pt stated no recurrence)   Hyperlipidemia    Hypothyroidism    followed by pcp   Osteoporosis    PMB (postmenopausal bleeding)    PSVT (paroxysmal supraventricular tachycardia) (HCC) 10/2012   in ED resolved w/ vagal maneuver    S/P  aortic valve replacement 07/27/2016   for severe stenosis;  last echo in epic 09-22-2018 ef 55-60%, mild TR, aortic valve area 1.4cm^2 and mean gradiant 17 mmHg   Seasonal allergies    Vitamin D  deficiency    Patient Active Problem List   Diagnosis Date Noted   GI bleed 07/01/2023   History of ventricular tachycardia 07/01/2023   Acute heart failure with reduced ejection fraction (HFrEF, <= 40%) (HCC) 07/01/2023   Acute kidney injury superimposed on chronic kidney disease (HCC) 07/01/2023   GI bleeding 07/01/2023   Hyperkalemia 07/01/2023   Sinus bradycardia 07/01/2023   Acute ST elevation myocardial infarction (STEMI) due to occlusion of left anterior descending (LAD) coronary artery (HCC) 06/19/2023   Encounter for monitoring amiodarone  therapy 10/06/2022   Persistent atrial fibrillation (HCC) 09/08/2022   PAF (paroxysmal atrial fibrillation) (HCC) 08/24/2022   Hypercoagulable state due to persistent atrial fibrillation (HCC) 08/24/2022   Atrial fibrillation with rapid ventricular response (HCC) 12/30/2021   Atrial fibrillation with RVR (HCC) 12/30/2021   Chronic kidney disease, stage 3 unspecified (HCC) 07/22/2021   Drug-induced myopathy 07/22/2021   History of transcatheter aortic valve replacement (TAVR) 07/22/2021   History of peptic ulcer 07/22/2021   Osteoporosis 07/22/2021   Personal history of malignant neoplasm of breast 07/22/2021   Rheumatic aortic stenosis with regurgitation 07/22/2021   Senile osteopenia 07/22/2021   Vitamin D  deficiency 07/22/2021   History of total knee  replacement, right 11/04/2019   Pain in joint of left knee 11/04/2019   Osteoarthritis of left knee 11/04/2019   Carotid artery disease (HCC)    ETD (Eustachian tube dysfunction), bilateral 05/10/2017   Seasonal allergic rhinitis 05/10/2017   Aortic stenosis, severe    Rheumatic fever    CAD (coronary artery disease), native coronary artery 10/20/2015   Weakness 05/08/2015   Breast cancer,  right breast (HCC)    PUD (peptic ulcer disease)    Hypothyroidism    Hypercholesteremia    Obese 08/29/2013   S/P right THA, AA 08/28/2013   Chronic diastolic heart failure (HCC) 02/13/2013   SVT (supraventricular tachycardia) (HCC) 10/24/2012   Home Medication(s) Prior to Admission medications   Medication Sig Start Date End Date Taking? Authorizing Provider  acetaminophen  (TYLENOL ) 500 MG tablet Take 2 tablets (1,000 mg total) by mouth every 6 (six) hours as needed. 06/28/23 06/27/24  Lee, [place], NP  amiodarone  (PACERONE ) 400 MG tablet Take 2 tablets (400 mg) twice daily through Wednesday 07/06/23.  On Thursday 07/07/23, start taking 400 mg daily. 06/28/23   Lee, [place], NP  clopidogrel  (PLAVIX ) 75 MG tablet Take 1 tablet (75 mg total) by mouth daily. 06/28/23   Lee, [place], NP  furosemide  (LASIX ) 40 MG tablet Take 1 tablet (40 mg total) by mouth daily. 06/28/23   Lee, [place], NP  levothyroxine  (SYNTHROID , LEVOTHROID) 112 MCG tablet Take 112 mcg by mouth daily before breakfast.    [provider]  lidocaine  (LIDODERM ) 5 % Place 1 patch onto the skin daily. Remove & Discard patch within 12 hours or as directed by MD 06/28/23   Lee, [place], NP  Magnesium  400 MG CAPS Take 400 mg by mouth in the morning and at bedtime.    [provider]  mexiletine (MEXITIL ) 150 MG capsule Take 1 capsule (150 mg total) by mouth every 8 (eight) hours. 06/28/23   Lee, [place], NP  pantoprazole  (PROTONIX ) 40 MG tablet Take 1 tablet (40 mg total) by mouth daily. 06/28/23   Lee, [place], NP  potassium chloride  SA (KLOR-CON  M) 20 MEQ tablet Take 1 tablet (20 mEq total) by mouth daily. 06/28/23   Lee, [place], NP  spironolactone  (ALDACTONE ) 25 MG tablet Take 1 tablet (25 mg total) by mouth daily. 06/28/23   Lee, [place], NP  vitamin C  (ASCORBIC ACID ) 500 MG tablet Take 500 mg by mouth daily.    [provider]  XARELTO  15 MG TABS tablet TAKE 1 TABLET(15 MG) BY MOUTH DAILY WITH SUPPER Patient taking  differently: Take 15 mg by mouth daily with supper. 12/06/22   Lucien Orren SAILOR, PA-C                                                                                                                                    Past Surgical History Past Surgical History:  Procedure Laterality Date   CARDIAC CATHETERIZATION N/A 07/18/2015   Procedure:  Right/Left Heart Cath and Coronary Angiography;  Surgeon: Lonni JONETTA Cash, MD;  Location: [facility] INVASIVE CV LAB;  Service: Cardiovascular;  Laterality: N/A;   CARDIOVERSION N/A 01/01/2022   Procedure: CARDIOVERSION;  Surgeon: Hobart Powell BRAVO, MD;  Location: [facility] ENDOSCOPY;  Service: Cardiovascular;  Laterality: N/A;   CARDIOVERSION N/A 09/10/2022   Procedure: CARDIOVERSION;  Surgeon: Delford Maude BROCKS, MD;  Location: [facility] INVASIVE CV LAB;  Service: Cardiovascular;  Laterality: N/A;   CATARACT EXTRACTION W/ INTRAOCULAR LENS  IMPLANT, BILATERAL     2017   CHOLECYSTECTOMY N/A 08/06/2014   Procedure: LAPAROSCOPIC CHOLECYSTECTOMY ;  Surgeon: Donnice Bury, MD;  Location: [facility] OR;  Service: General;  Laterality: N/A;   CORONARY/GRAFT ACUTE MI REVASCULARIZATION N/A 06/19/2023   Procedure: Coronary/Graft Acute MI Revascularization;  Surgeon: Elmira Newman PARAS, MD;  Location: [facility] INVASIVE CV LAB;  Service: Cardiovascular;  Laterality: N/A;   HYSTEROSCOPY WITH D & C N/A 05/26/2020   Procedure: DILATATION AND CURETTAGE /HYSTEROSCOPY;  Surgeon: Jannis Kate Norris, MD;  Location: [facility];  Service: Gynecology;  Laterality: N/A;  Request case for [facility]   LEFT HEART CATH AND CORONARY ANGIOGRAPHY N/A 06/19/2023   Procedure: LEFT HEART CATH AND CORONARY ANGIOGRAPHY;  Surgeon: Elmira Newman PARAS, MD;  Location: [facility] INVASIVE CV LAB;  Service: Cardiovascular;  Laterality: N/A;   PARTIAL MASTECTOMY WITH AXILLARY SENTINEL LYMPH NODE BIOPSY Right 2006   RIGHT HEART CATH N/A 06/19/2023   Procedure: RIGHT HEART CATH;  Surgeon: Elmira Newman PARAS, MD;  Location: [facility]  INVASIVE CV LAB;  Service: Cardiovascular;  Laterality: N/A;   TEE WITHOUT CARDIOVERSION N/A 07/27/2016   Procedure: TRANSESOPHAGEAL ECHOCARDIOGRAM (TEE);  Surgeon: Cash Lonni JONETTA, MD;  Location: [facility] OR;  Service: Open Heart Surgery;  Laterality: N/A;   TEE WITHOUT CARDIOVERSION N/A 01/01/2022   Procedure: TRANSESOPHAGEAL ECHOCARDIOGRAM (TEE);  Surgeon: Hobart Powell BRAVO, MD;  Location: [facility] ENDOSCOPY;  Service: Cardiovascular;  Laterality: N/A;   THYROID  SURGERY  1969   NODULE REMOVED, benign per pt   TONSILLECTOMY  age 42   TOTAL HIP ARTHROPLASTY Left 2003   TOTAL HIP ARTHROPLASTY Right 08/28/2013   Procedure: RIGHT TOTAL HIP ARTHROPLASTY ANTERIOR APPROACH;  Surgeon: Donnice JONETTA Car, MD;  Location: [facility] ORS;  Service: Orthopedics;  Laterality: Right;   TOTAL KNEE ARTHROPLASTY Right 01-16-2007  @[facility]    TRANSCATHETER AORTIC VALVE REPLACEMENT, TRANSFEMORAL N/A 07/27/2016   Procedure: TRANSCATHETER AORTIC VALVE REPLACEMENT, TRANSFEMORAL;  Surgeon: Cash Lonni JONETTA, MD;  Location: [facility] OR;  Service: Open Heart Surgery;  Laterality: N/A;   Family History Family History  Problem Relation Age of Onset   Heart disease Mother    Heart disease Father     Social History Social History   Tobacco Use   Smoking status: Never   Smokeless tobacco: Never   Tobacco comments:    Never smoked 08/24/22  Vaping Use   Vaping status: Never Used  Substance Use Topics   Alcohol use: No   Drug use: Never   Allergies Compazine [prochlorperazine], Fenofibrate, Floxin [ofloxacin], and Statins  Review of Systems Review of Systems  Respiratory:  Positive for shortness of breath.   Gastrointestinal:  Positive for vomiting.    Physical Exam Vital Signs  I have reviewed the triage vital signs BP 115/60   Pulse (!) 47   Temp 97.6 F (36.4 C) (Axillary)   Resp 20   LMP  (LMP Unknown)   SpO2 99%   Physical Exam Vitals and nursing note reviewed.  Constitutional:  General: She is not in  acute distress.    Appearance: She is well-developed. She is ill-appearing.  HENT:     Head: Normocephalic and atraumatic.   Eyes:     Conjunctiva/sclera: Conjunctivae normal.    Cardiovascular:     Rate and Rhythm: Normal rate and regular rhythm.     Heart sounds: No murmur heard. Pulmonary:     Effort: Pulmonary effort is normal. No respiratory distress.     Breath sounds: Normal breath sounds.  Abdominal:     Palpations: Abdomen is soft.     Tenderness: There is no abdominal tenderness.   Musculoskeletal:        General: No swelling.     Cervical back: Neck supple.   Skin:    General: Skin is warm and dry.     Capillary Refill: Capillary refill takes less than 2 seconds.   Neurological:     Mental Status: She is alert.   Psychiatric:        Mood and Affect: Mood normal.     ED Results and Treatments Labs (all labs ordered are listed, but only abnormal results are displayed) Labs Reviewed  COMPREHENSIVE METABOLIC PANEL WITH GFR - Abnormal; Notable for the following components:      Result Value   Sodium 129 (*)    Potassium 5.3 (*)    CO2 20 (*)    Glucose, Bld 127 (*)    BUN 41 (*)    Creatinine, Ser 1.97 (*)    Calcium 8.6 (*)    Albumin  2.9 (*)    GFR, Estimated 24 (*)    All other components within normal limits  CBC WITH DIFFERENTIAL/PLATELET - Abnormal; Notable for the following components:   RBC 2.85 (*)    Hemoglobin 10.0 (*)    HCT 31.0 (*)    MCV 108.8 (*)    MCH 35.1 (*)    RDW 15.8 (*)    nRBC 1.3 (*)    Abs Immature Granulocytes 0.10 (*)    All other components within normal limits  BRAIN NATRIURETIC PEPTIDE - Abnormal; Notable for the following components:   B Natriuretic Peptide 4,445.8 (*)    All other components within normal limits  HEMOGLOBIN AND HEMATOCRIT, BLOOD - Abnormal; Notable for the following components:   Hemoglobin 9.7 (*)    HCT 30.3 (*)    All other components within normal limits  POC OCCULT BLOOD, ED - Abnormal;  Notable for the following components:   Fecal Occult Bld POSITIVE (*)    All other components within normal limits  TROPONIN I (HIGH SENSITIVITY) - Abnormal; Notable for the following components:   Troponin I (High Sensitivity) 82 (*)    All other components within normal limits  TROPONIN I (HIGH SENSITIVITY) - Abnormal; Notable for the following components:   Troponin I (High Sensitivity) 85 (*)    All other components within normal limits  PROTIME-INR  APTT  CBC  COMPREHENSIVE METABOLIC PANEL WITH GFR  HEMOGLOBIN AND HEMATOCRIT, BLOOD  HEMOGLOBIN AND HEMATOCRIT, BLOOD  HEMOGLOBIN AND HEMATOCRIT, BLOOD  URINALYSIS, ROUTINE W REFLEX MICROSCOPIC  SODIUM, URINE, RANDOM  CREATININE, URINE, RANDOM  CBG MONITORING, ED  TYPE AND SCREEN  PREPARE RBC (CROSSMATCH)  Radiology DG Chest Portable 1 View Result Date: 07/01/2023 CLINICAL DATA:  dyspnea EXAM: PORTABLE CHEST - 1 VIEW COMPARISON:  June 21, 2023 FINDINGS: Central pulmonary vascular congestion. No focal airspace consolidation, pleural effusion, or pneumothorax. Mild cardiomegaly. Endovascular aortic valve replacement. Aortic atherosclerosis.No acute fracture or destructive lesion. IMPRESSION: Mild cardiomegaly with central pulmonary vascular congestion, unchanged. Electronically Signed   By: Rogelia Myers M.D.   On: 07/01/2023 21:48    Pertinent labs & imaging results that were available during my care of the patient were reviewed by me and considered in my medical decision making (see MDM for details).  Medications Ordered in ED Medications  pantoprazole  (PROTONIX ) injection 40 mg (has no administration in time range)  clopidogrel  (PLAVIX ) tablet 75 mg (has no administration in time range)  levothyroxine  (SYNTHROID ) tablet 112 mcg (has no administration in time range)  mexiletine (MEXITIL ) capsule 150 mg (150 mg  Oral Given 07/02/23 0118)  potassium chloride  SA (KLOR-CON  M) CR tablet 20 mEq (has no administration in time range)  spironolactone  (ALDACTONE ) tablet 25 mg (has no administration in time range)  ascorbic acid  (VITAMIN C ) tablet 500 mg (has no administration in time range)  sodium chloride  flush (NS) 0.9 % injection 3 mL (3 mLs Intravenous Not Given 07/02/23 0033)  sodium chloride  flush (NS) 0.9 % injection 3 mL (3 mLs Intravenous Given 07/02/23 0030)  sodium chloride  flush (NS) 0.9 % injection 3 mL (has no administration in time range)  0.9 %  sodium chloride  infusion (has no administration in time range)  ondansetron  (ZOFRAN ) tablet 4 mg (has no administration in time range)    Or  ondansetron  (ZOFRAN ) injection 4 mg (has no administration in time range)  acetaminophen  (TYLENOL ) tablet 650 mg (has no administration in time range)    Or  acetaminophen  (TYLENOL ) suppository 650 mg (has no administration in time range)  0.9 %  sodium chloride  infusion (Manually program via Guardrails IV Fluids) (0 mLs Intravenous Hold 07/02/23 0034)  pantoprazole  (PROTONIX ) injection 40 mg (40 mg Intravenous Given 07/01/23 2310)  pantoprazole  (PROTONIX ) injection 40 mg (40 mg Intravenous Given 07/02/23 0030)  furosemide  (LASIX ) injection 20 mg (20 mg Intravenous Given 07/02/23 0025)  sodium zirconium cyclosilicate (LOKELMA) packet 5 g (5 g Oral Given 07/02/23 0121)  calcium gluconate 1 g/ 50 mL sodium chloride  IVPB (0 mg Intravenous Stopped 07/02/23 0117)                                                                                                                                     Procedures .Critical Care  Performed by: Albertina Dixon, MD Authorized by: Albertina Dixon, MD   Critical care provider statement:    Critical care time (minutes):  30   Critical care was necessary to treat or prevent imminent or life-threatening deterioration of the following conditions:  Respiratory failure   Critical care was  time spent personally by me on the  following activities:  Development of treatment plan with patient or surrogate, discussions with consultants, evaluation of patient's response to treatment, examination of patient, ordering and review of laboratory studies, ordering and review of radiographic studies, ordering and performing treatments and interventions, pulse oximetry, re-evaluation of patient's condition and review of old charts   (including critical care time)  Medical Decision Making / ED Course   This patient presents to the ED for concern of shortness of breath, hematemesis, melena, this involves an extensive number of treatment options, and is a complaint that carries with it a high risk of complications and morbidity.  The differential diagnosis includes upper GI bleed including PUD, varices, boerhaave's, Mallory Weiss tear, aortoenteric fistula versus lower GI bleed including mass, diverticulosis/diverticulitis, hemorrhoids, anal fissure, mesenteric ischemia, aortoenteric fistula, colitis  MDM: Patient seen emergency room for evaluation of hematemesis, blood in stool and shortness of breath.  Physical exam reveals an ill-appearing pale patient but is otherwise unremarkable.  Cardiopulmonary exam largely unremarkable outside of the regular cardia.  Laboratory ration with a hemoglobin of 10.0 which is patient's baseline, sodium 129, potassium 5.3, BUN 41, creatinine 1.97 which is an increase from patient's baseline.  Fecal occult positive and Protonix  given for suspected GI bleed.  BNP is significant elevated at 4445.8 initial high-sensitivity troponin 82.  Chest x-ray with pulmonary vascular congestion.  Patient ultimately will require hospital admission for GI bleed.  Patient admitted   Additional history obtained:  -External records from outside source obtained and reviewed including: Chart review including previous notes, labs, imaging, consultation notes   Lab Tests: -I ordered,  reviewed, and interpreted labs.   The pertinent results include:   Labs Reviewed  COMPREHENSIVE METABOLIC PANEL WITH GFR - Abnormal; Notable for the following components:      Result Value   Sodium 129 (*)    Potassium 5.3 (*)    CO2 20 (*)    Glucose, Bld 127 (*)    BUN 41 (*)    Creatinine, Ser 1.97 (*)    Calcium 8.6 (*)    Albumin  2.9 (*)    GFR, Estimated 24 (*)    All other components within normal limits  CBC WITH DIFFERENTIAL/PLATELET - Abnormal; Notable for the following components:   RBC 2.85 (*)    Hemoglobin 10.0 (*)    HCT 31.0 (*)    MCV 108.8 (*)    MCH 35.1 (*)    RDW 15.8 (*)    nRBC 1.3 (*)    Abs Immature Granulocytes 0.10 (*)    All other components within normal limits  BRAIN NATRIURETIC PEPTIDE - Abnormal; Notable for the following components:   B Natriuretic Peptide 4,445.8 (*)    All other components within normal limits  HEMOGLOBIN AND HEMATOCRIT, BLOOD - Abnormal; Notable for the following components:   Hemoglobin 9.7 (*)    HCT 30.3 (*)    All other components within normal limits  POC OCCULT BLOOD, ED - Abnormal; Notable for the following components:   Fecal Occult Bld POSITIVE (*)    All other components within normal limits  TROPONIN I (HIGH SENSITIVITY) - Abnormal; Notable for the following components:   Troponin I (High Sensitivity) 82 (*)    All other components within normal limits  TROPONIN I (HIGH SENSITIVITY) - Abnormal; Notable for the following components:   Troponin I (High Sensitivity) 85 (*)    All other components within normal limits  PROTIME-INR  APTT  CBC  COMPREHENSIVE METABOLIC PANEL WITH GFR  HEMOGLOBIN AND HEMATOCRIT, BLOOD  HEMOGLOBIN AND HEMATOCRIT, BLOOD  HEMOGLOBIN AND HEMATOCRIT, BLOOD  URINALYSIS, ROUTINE W REFLEX MICROSCOPIC  SODIUM, URINE, RANDOM  CREATININE, URINE, RANDOM  CBG MONITORING, ED  TYPE AND SCREEN  PREPARE RBC (CROSSMATCH)        Imaging Studies ordered: I ordered imaging studies  including chest x-ray I independently visualized and interpreted imaging. I agree with the radiologist interpretation   Medicines ordered and prescription drug management: Meds ordered this encounter  Medications   pantoprazole  (PROTONIX ) injection 40 mg   pantoprazole  (PROTONIX ) injection 40 mg   pantoprazole  (PROTONIX ) injection 40 mg   DISCONTD: amiodarone  (PACERONE ) tablet 400 mg    Take 2 tablets (400 mg) twice daily through Wednesday 07/06/23.  On Thursday 07/07/23, start taking 400 mg daily.     clopidogrel  (PLAVIX ) tablet 75 mg   levothyroxine  (SYNTHROID ) tablet 112 mcg   mexiletine (MEXITIL ) capsule 150 mg   potassium chloride  SA (KLOR-CON  M) CR tablet 20 mEq   spironolactone  (ALDACTONE ) tablet 25 mg   ascorbic acid  (VITAMIN C ) tablet 500 mg   furosemide  (LASIX ) injection 20 mg   sodium chloride  flush (NS) 0.9 % injection 3 mL   sodium chloride  flush (NS) 0.9 % injection 3 mL   sodium chloride  flush (NS) 0.9 % injection 3 mL   0.9 %  sodium chloride  infusion   OR Linked Order Group    ondansetron  (ZOFRAN ) tablet 4 mg    ondansetron  (ZOFRAN ) injection 4 mg   OR Linked Order Group    acetaminophen  (TYLENOL ) tablet 650 mg    acetaminophen  (TYLENOL ) suppository 650 mg   0.9 %  sodium chloride  infusion (Manually program via Guardrails IV Fluids)   sodium zirconium cyclosilicate (LOKELMA) packet 5 g   DISCONTD: insulin  aspart (novoLOG) injection 10 Units   DISCONTD: dextrose  50 % solution 50 mL   calcium gluconate 1 g/ 50 mL sodium chloride  IVPB    -I have reviewed the patients home medicines and have made adjustments as needed  Critical interventions Oxygen supplementation, Protonix     Cardiac Monitoring: The patient was maintained on a cardiac monitor.  I personally viewed and interpreted the cardiac monitored which showed an underlying rhythm of: Sinus bradycardia  Social Determinants of Health:  Factors impacting patients care include: Recently discharged to skilled  nursing facility   Reevaluation: After the interventions noted above, I reevaluated the patient and found that they have :improved  Co morbidities that complicate the patient evaluation  Past Medical History:  Diagnosis Date   Arthritis    hands, knees   CAD (coronary artery disease), native coronary artery cardiologist--- dr verlin   a. mild non obst CAD per cath 07-18-2015   Carotid artery disease (HCC)    Carotid US  09/2018: Bilateral ICA 40-59 // Carotid US  11/21: Bilateral ICA 40-59; repeat 1 year    Chronic diastolic CHF (congestive heart failure) (HCC)    followed by cardiology   CKD (chronic kidney disease), stage III (HCC)    followed by pcp   Dyspnea    per pt when she walks which is usual for her   History of gastric ulcer    remote yrs ago   History of rheumatic fever as a child    History of right breast cancer 2006   s/p right partial mastectomy w/ node dissection's,  completed chemo/ radiation same year  (05-21-2020 pt stated no recurrence)   Hyperlipidemia    Hypothyroidism    followed by pcp  Osteoporosis    PMB (postmenopausal bleeding)    PSVT (paroxysmal supraventricular tachycardia) (HCC) 10/2012   in ED resolved w/ vagal maneuver    S/P aortic valve replacement 07/27/2016   for severe stenosis;  last echo in epic 09-22-2018 ef 55-60%, mild TR, aortic valve area 1.4cm^2 and mean gradiant 17 mmHg   Seasonal allergies    Vitamin D  deficiency       Dispostion: I considered admission for this patient, and he will require hospital admission for new oxygen requirement, shortness of breath and GI bleed     Final Clinical Impression(s) / ED Diagnoses Final diagnoses:  Acute GI bleeding     @PCDICTATION @    Albertina Dixon, MD 07/02/23 [phone]

## 2023-07-01 NOTE — ED Provider Notes (Signed)
  Provider Note MRN:  [id number]  Arrival date & time: 07/02/23    ED Course and Medical Decision Making  Assumed care of patient at sign-out or upon transfer.  Hematemesis and melena today at rehab.  Patient MI this month, currently on Plavix  as well as Xarelto .  Medicine accepts for admission, consulting GI for the morning.  Patient is stable, well-appearing on my assessment with normal vital signs.  Procedures  Final Clinical Impressions(s) / ED Diagnoses     ICD-10-CM   1. Acute GI bleeding  K92.2       ED Discharge Orders     None       Discharge Instructions   None     Ozell HERO. Theadore, MD [facility] Emergency Medicine [facility] [email or web address]    Theadore Ozell HERO, MD 07/02/23 [phone]

## 2023-07-01 NOTE — ED Notes (Signed)
 Lab to add BNP to existing blood.

## 2023-07-01 NOTE — H&P (Incomplete)
 History and Physical    [name] FMW:[id number] DOB: 1931/04/03 DOA: 07/01/2023  PCP: [name], MD   Patient coming from: SNF   Chief Complaint:  Chief Complaint  Patient presents with   GI Bleeding   ED TRIAGE note:  Pt to ED BIB EMS from [facility] with complaint of coffee ground emesis that began this evening - was initially c/o nausea & abd pain but subsided after vomiting. SHOB that began yesterday.    Admitted to [facility] on 6/24 for rehab s/p STEMI on 6/15.       HPI:  [name] is a 89 y.o. female with medical history significant of recent STEMI 06/28/2023 underwent emergent heart cath status post LAD DES stent, HFrEF  40% to 45%, cardiogenic shock required norepinephrine  and milrinone , ventricular tachycardia fibrillation treated with IV lidocaine  and amiodarone -transition to amiodarone  and mexiletine, postcardiac arrest required CPR, limited GDMT due to low EF and blood pressure, on Plavix  and Xarelto  due to management of CAD and ventricular tachycardia, history of bioprosthetic TAVR 2018,  pulmonary hypertension, and CKD stage IIIb presented emergency department complaining of coffee-ground emesis.  Daughter at the bedside reported that nursing home called and informed her patient has 1 episode of coffee-ground emesis.  There is no episodes of melena or melena.  In the ED FOBT positive and physical exam did not showed any blood in the rectal vault.  History of internal hemorrhoid.  Patient is lying comfortably in the bed denies any abdominal pain, nausea and vomiting.  Denies any chest pain however she is endorsing shortness of breath on exertion denies any lower extremity edema.  No other complaint at this time.   ED Course:  At presentation to ED patient is bradycardic heart rate 48 and hypotensive 110/56. FOBT positive and blood in the rectal vault. Elevated BNP 4445. CBC showing normal WBC count, stable H&H 10 and 31, normal platelet  count 388. Elevated troponin 82. CMP showing low sodium 129, elevated potassium 5.3, low bicarb 20, elevated creatinine 1.9, low albumin  2.9 and GFR 24. In the ED  Chest x-ray showed cardiomegaly with central pulmonary vascular congestion unchanged.  In the ED Patient has been given IV Protonix . In the ED patient is hemodynamically stable.  ED physician has been consulted GI to evaluate the patient in the morning.  Hospitalist has been consulted for further evaluation management of GI  bleed and acute on chronic CHF exacerbation.  Consulted and discussed case with on-call cardiology Dr. Donnel who recommended to continue Plavix  to prevent in-stent rethrombosis as patient has stent placement just 1 week ago and hold the Xarelto .  For the management of CHF exacerbation agrees and recommended to give IV Lasix  20 mg one-time dose and based on patient response can adjust the diuretics.  Also recommended to start Protonix  drip.   Significant labs in the ED: Lab Orders         Comprehensive metabolic panel         CBC with Differential         Brain natriuretic peptide         Protime-INR         APTT         CBC         Comprehensive metabolic panel         Hemoglobin and hematocrit, blood         Urinalysis, Routine w reflex microscopic -Urine, Clean Catch  Sodium, urine, random         Creatinine, urine, random         POC occult blood, ED         CBG monitoring, ED       Review of Systems:  Review of Systems  Constitutional:  Negative for chills, fever and weight loss.  Respiratory:  Positive for shortness of breath. Negative for cough and sputum production.   Cardiovascular:  Positive for orthopnea. Negative for chest pain, palpitations and leg swelling.  Gastrointestinal:  Negative for abdominal pain, blood in stool, constipation, diarrhea, heartburn, melena, nausea and vomiting.       Coffee-ground emesis  Musculoskeletal:  Negative for neck pain.  Neurological:   Negative for dizziness and headaches.  Psychiatric/Behavioral:  The patient is not nervous/anxious.     Past Medical History:  Diagnosis Date   Arthritis    hands, knees   CAD (coronary artery disease), native coronary artery cardiologist--- dr verlin   a. mild non obst CAD per cath 07-18-2015   Carotid artery disease (HCC)    Carotid US  09/2018: Bilateral ICA 40-59 // Carotid US  11/21: Bilateral ICA 40-59; repeat 1 year    Chronic diastolic CHF (congestive heart failure) (HCC)    followed by cardiology   CKD (chronic kidney disease), stage III (HCC)    followed by pcp   Dyspnea    per pt when she walks which is usual for her   History of gastric ulcer    remote yrs ago   History of rheumatic fever as a child    History of right breast cancer 2006   s/p right partial mastectomy w/ node dissection's,  completed chemo/ radiation same year  (05-21-2020 pt stated no recurrence)   Hyperlipidemia    Hypothyroidism    followed by pcp   Osteoporosis    PMB (postmenopausal bleeding)    PSVT (paroxysmal supraventricular tachycardia) (HCC) 10/2012   in ED resolved w/ vagal maneuver    S/P aortic valve replacement 07/27/2016   for severe stenosis;  last echo in epic 09-22-2018 ef 55-60%, mild TR, aortic valve area 1.4cm^2 and mean gradiant 17 mmHg   Seasonal allergies    Vitamin D  deficiency     Past Surgical History:  Procedure Laterality Date   CARDIAC CATHETERIZATION N/A 07/18/2015   Procedure: Right/Left Heart Cath and Coronary Angiography;  Surgeon: Lonni JONETTA verlin, MD;  Location: [facility] INVASIVE CV LAB;  Service: Cardiovascular;  Laterality: N/A;   CARDIOVERSION N/A 01/01/2022   Procedure: CARDIOVERSION;  Surgeon: Hobart Powell BRAVO, MD;  Location: [facility] ENDOSCOPY;  Service: Cardiovascular;  Laterality: N/A;   CARDIOVERSION N/A 09/10/2022   Procedure: CARDIOVERSION;  Surgeon: Delford Maude BROCKS, MD;  Location: [facility] INVASIVE CV LAB;  Service: Cardiovascular;  Laterality: N/A;    CATARACT EXTRACTION W/ INTRAOCULAR LENS  IMPLANT, BILATERAL     2017   CHOLECYSTECTOMY N/A 08/06/2014   Procedure: LAPAROSCOPIC CHOLECYSTECTOMY ;  Surgeon: Donnice Bury, MD;  Location: [facility] OR;  Service: General;  Laterality: N/A;   CORONARY/GRAFT ACUTE MI REVASCULARIZATION N/A 06/19/2023   Procedure: Coronary/Graft Acute MI Revascularization;  Surgeon: Elmira Newman PARAS, MD;  Location: [facility] INVASIVE CV LAB;  Service: Cardiovascular;  Laterality: N/A;   HYSTEROSCOPY WITH D & C N/A 05/26/2020   Procedure: DILATATION AND CURETTAGE /HYSTEROSCOPY;  Surgeon: Jannis Kate Norris, MD;  Location: [facility];  Service: Gynecology;  Laterality: N/A;  Request case for [facility]   LEFT HEART CATH AND CORONARY ANGIOGRAPHY  N/A 06/19/2023   Procedure: LEFT HEART CATH AND CORONARY ANGIOGRAPHY;  Surgeon: Elmira Newman PARAS, MD;  Location: [facility] INVASIVE CV LAB;  Service: Cardiovascular;  Laterality: N/A;   PARTIAL MASTECTOMY WITH AXILLARY SENTINEL LYMPH NODE BIOPSY Right 2006   RIGHT HEART CATH N/A 06/19/2023   Procedure: RIGHT HEART CATH;  Surgeon: Elmira Newman PARAS, MD;  Location: [facility] INVASIVE CV LAB;  Service: Cardiovascular;  Laterality: N/A;   TEE WITHOUT CARDIOVERSION N/A 07/27/2016   Procedure: TRANSESOPHAGEAL ECHOCARDIOGRAM (TEE);  Surgeon: Verlin Lonni BIRCH, MD;  Location: [facility] OR;  Service: Open Heart Surgery;  Laterality: N/A;   TEE WITHOUT CARDIOVERSION N/A 01/01/2022   Procedure: TRANSESOPHAGEAL ECHOCARDIOGRAM (TEE);  Surgeon: Hobart Powell BRAVO, MD;  Location: [facility] ENDOSCOPY;  Service: Cardiovascular;  Laterality: N/A;   THYROID  SURGERY  1969   NODULE REMOVED, benign per pt   TONSILLECTOMY  age 89   TOTAL HIP ARTHROPLASTY Left 2003   TOTAL HIP ARTHROPLASTY Right 08/28/2013   Procedure: RIGHT TOTAL HIP ARTHROPLASTY ANTERIOR APPROACH;  Surgeon: Donnice BIRCH Car, MD;  Location: [facility] ORS;  Service: Orthopedics;  Laterality: Right;   TOTAL KNEE ARTHROPLASTY Right 01-16-2007  @[facility]     TRANSCATHETER AORTIC VALVE REPLACEMENT, TRANSFEMORAL N/A 07/27/2016   Procedure: TRANSCATHETER AORTIC VALVE REPLACEMENT, TRANSFEMORAL;  Surgeon: Verlin Lonni BIRCH, MD;  Location: [facility] OR;  Service: Open Heart Surgery;  Laterality: N/A;     reports that she has never smoked. She has never used smokeless tobacco. She reports that she does not drink alcohol and does not use drugs.  Allergies  Allergen Reactions   Compazine [Prochlorperazine] Other (See Comments)    Unknown, pt does not remember   Fenofibrate Rash   Floxin [Ofloxacin] Other (See Comments)    Unknown, pt does not remember reaction but was allergic reaction   Statins Other (See Comments)    Myalgia - Atorvastatin, simvastatin, pitavastatin    Family History  Problem Relation Age of Onset   Heart disease Mother    Heart disease Father     Prior to Admission medications   Medication Sig Start Date End Date Taking? Authorizing Provider  acetaminophen  (TYLENOL ) 500 MG tablet Take 2 tablets (1,000 mg total) by mouth every 6 (six) hours as needed. 06/28/23 06/27/24  Lee, [place], NP  amiodarone  (PACERONE ) 400 MG tablet Take 2 tablets (400 mg) twice daily through Wednesday 07/06/23.  On Thursday 07/07/23, start taking 400 mg daily. 06/28/23   Lee, [place], NP  clopidogrel  (PLAVIX ) 75 MG tablet Take 1 tablet (75 mg total) by mouth daily. 06/28/23   Lee, [place], NP  furosemide  (LASIX ) 40 MG tablet Take 1 tablet (40 mg total) by mouth daily. 06/28/23   Lee, [place], NP  levothyroxine  (SYNTHROID , LEVOTHROID) 112 MCG tablet Take 112 mcg by mouth daily before breakfast.    [provider]  lidocaine  (LIDODERM ) 5 % Place 1 patch onto the skin daily. Remove & Discard patch within 12 hours or as directed by MD 06/28/23   Lee, [place], NP  Magnesium  400 MG CAPS Take 400 mg by mouth in the morning and at bedtime.    [provider]  mexiletine (MEXITIL ) 150 MG capsule Take 1 capsule (150 mg total) by mouth every 8 (eight) hours.  06/28/23   Lee, [place], NP  pantoprazole  (PROTONIX ) 40 MG tablet Take 1 tablet (40 mg total) by mouth daily. 06/28/23   Lee, [place], NP  potassium chloride  SA (KLOR-CON  M) 20 MEQ tablet Take 1 tablet (20 mEq total) by mouth daily. 06/28/23  Lee, [place], NP  spironolactone  (ALDACTONE ) 25 MG tablet Take 1 tablet (25 mg total) by mouth daily. 06/28/23   Lee, [place], NP  vitamin C  (ASCORBIC ACID ) 500 MG tablet Take 500 mg by mouth daily.    [provider]  XARELTO  15 MG TABS tablet TAKE 1 TABLET(15 MG) BY MOUTH DAILY WITH SUPPER Patient taking differently: Take 15 mg by mouth daily with supper. 12/06/22   Lucien Orren SAILOR, PA-C     Physical Exam: Vitals:   07/01/23 2138 07/01/23 2139 07/01/23 2145 07/01/23 2230  BP: 104/88  (!) 110/56 (!) 111/58  Pulse: (!) 48  (!) 49 (!) 47  Resp: 20  20 17   Temp: 97.6 F (36.4 C)     TempSrc: Axillary     SpO2: 100% 100% 100% 100%    Physical Exam Vitals and nursing note reviewed.  Constitutional:      Appearance: She is ill-appearing.  HENT:     Mouth/Throat:     Mouth: Mucous membranes are moist.   Cardiovascular:     Rate and Rhythm: Regular rhythm. Bradycardia present.     Pulses: Normal pulses.     Heart sounds: Normal heart sounds.  Pulmonary:     Effort: Pulmonary effort is normal.     Breath sounds: Normal breath sounds. No wheezing.  Abdominal:     Palpations: Abdomen is soft.   Musculoskeletal:     Cervical back: Neck supple.     Right lower leg: No edema.     Left lower leg: No edema.   Skin:    General: Skin is dry.     Capillary Refill: Capillary refill takes less than 2 seconds.   Neurological:     Mental Status: She is alert and oriented to person, place, and time.   Psychiatric:        Mood and Affect: Mood normal.      Labs on Admission: I have personally reviewed following labs and imaging studies  CBC: Recent Labs  Lab 06/25/23 0241 06/26/23 0237 06/27/23 0234 06/28/23 0257 07/01/23 2138  07/01/23 2354  WBC 5.6 6.5 6.3 6.8 9.2  --   NEUTROABS  --   --   --   --  7.2  --   HGB 9.4* 9.7* 9.5* 9.3* 10.0* 9.7*  HCT 27.7* 28.9* 28.7* 28.4* 31.0* 30.3*  MCV 101.8* 103.2* 104.0* 104.4* 108.8*  --   PLT 220 252 255 292 338  --    Basic Metabolic Panel: Recent Labs  Lab 06/25/23 0241 06/26/23 0237 06/27/23 0234 06/28/23 0257 07/01/23 2138  NA 132* 128* 132* 133* 129*  K 3.8 4.4 3.8 4.1 5.3*  CL 95* 96* 93* 98 98  CO2 26 25 27 23  20*  GLUCOSE 108* 105* 101* 102* 127*  BUN 38* 37* 36* 37* 41*  CREATININE 1.47* 1.51* 1.63* 1.71* 1.97*  CALCIUM 8.7* 8.6* 9.2 8.7* 8.6*  MG 2.1 2.1 2.0 1.9  --    GFR: Estimated Creatinine Clearance: 17.9 mL/min (A) (by C-G formula based on SCr of 1.97 mg/dL (H)). Liver Function Tests: Recent Labs  Lab 07/01/23 2138  AST 36  ALT 24  ALKPHOS 84  BILITOT 1.1  PROT 6.5  ALBUMIN  2.9*   No results for input(s): LIPASE, AMYLASE in the last 168 hours. No results for input(s): AMMONIA in the last 168 hours. Coagulation Profile: No results for input(s): INR, PROTIME in the last 168 hours. Cardiac Enzymes: Recent Labs  Lab 07/01/23 2138  TROPONINIHS 82*  BNP (last 3 results) Recent Labs    07/01/23 2138  BNP 4,445.8*   HbA1C: No results for input(s): HGBA1C in the last 72 hours. CBG: Recent Labs  Lab 07/02/23 0019  GLUCAP 99   Lipid Profile: No results for input(s): CHOL, HDL, LDLCALC, TRIG, CHOLHDL, LDLDIRECT in the last 72 hours. Thyroid  Function Tests: No results for input(s): TSH, T4TOTAL, FREET4, T3FREE, THYROIDAB in the last 72 hours. Anemia Panel: No results for input(s): VITAMINB12, FOLATE, FERRITIN, TIBC, IRON, RETICCTPCT in the last 72 hours. Urine analysis:    Component Value Date/Time   COLORURINE YELLOW 07/22/2021 1200   APPEARANCEUR CLEAR 07/22/2021 1200   LABSPEC 1.010 07/22/2021 1200   PHURINE 5.0 07/22/2021 1200   GLUCOSEU NEGATIVE 07/22/2021 1200    HGBUR NEGATIVE 07/22/2021 1200   BILIRUBINUR NEGATIVE 07/21/2016 1354   KETONESUR NEGATIVE 07/22/2021 1200   PROTEINUR NEGATIVE 07/22/2021 1200   UROBILINOGEN 0.2 08/21/2013 0820   NITRITE NEGATIVE 07/22/2021 1200   LEUKOCYTESUR NEGATIVE 07/22/2021 1200    Radiological Exams on Admission: I have personally reviewed images DG Chest Portable 1 View Result Date: 07/01/2023 CLINICAL DATA:  dyspnea EXAM: PORTABLE CHEST - 1 VIEW COMPARISON:  June 21, 2023 FINDINGS: Central pulmonary vascular congestion. No focal airspace consolidation, pleural effusion, or pneumothorax. Mild cardiomegaly. Endovascular aortic valve replacement. Aortic atherosclerosis.No acute fracture or destructive lesion. IMPRESSION: Mild cardiomegaly with central pulmonary vascular congestion, unchanged. Electronically Signed   By: Rogelia Myers M.D.   On: 07/01/2023 21:48     EKG: My personal interpretation of EKG shows: Sinus bradycardia heart rate 50.    Assessment/Plan: Principal Problem:   GI bleeding Active Problems:   GI bleed   History of ventricular tachycardia   Hypothyroidism   History of transcatheter aortic valve replacement (TAVR)   Acute heart failure with reduced ejection fraction (HFrEF, <= 40%) (HCC)   Acute kidney injury superimposed on chronic kidney disease (HCC)   Hyperkalemia   Sinus bradycardia    Assessment and Plan: GI bleed-hematemesis melena >Presented to emergency department complaining of 1 episode of coffee-ground emesis.  FOBT positive and rectal exam did not show any evidence of bleeding.  Patient has history of internal hemorrhoid.  -Hemodynamically stable. -Stable H&H 10 and 31 normal WBC and platelet count. - Per chart review patient has ST3EMI, cardiogenic shock, cardiac arrest required pressor support with milrinone , developed VT required IV Amio drip, STEMI s/p LAD stent placement on Plavix  and ventricular tachycardia on amiodarone , mexiletine and Xarelto . - At presentation  to ED patient is bradycardic and blood pressure borderline soft.  However patient also has dyspnea, elevated BNP around 4500 and chest x-ray showing pulmonary vascular congestion - In the setting of GI bleed consulted both GI Dr. Charlanne and cardiology. - Cardiology Dr. Donnel recommended holding Xarelto , continue Plavix  to prevent in-stent rethrombosis.  Monitor H&H, continue IV Protonix  and if hemoglobin drop transfuse blood but it is very important to continue antiplatelet therapy. - Continue to monitor H&H, goal to keep hemoglobin above 8.  Transfusing 1 unit of blood in case patient needs blood transfusion. - In the ED patient has been given IV Protonix  total 80 mg. Continue IV Protonix  for 40 mg twice daily. -Due to underlying AKI on CKD stage IIIb limiting CT angio GI bleed study. -Starting clear liquid diet. - [facility] Dr. Charlanne has been consulted for further recommendation   Recent STEMI status post LAD stent 06/2023 History of cardiogenic shock History of cardiac arrest -Continue Plavix  in the setting  of recent cardiac stent placement. Due to underlying hypotension and low EF unable to tolerate GDMT.  At home patient is not Lopressor  that then she is on amiodarone  and mexiletine for rate control.  Sinus bradycardia History of ventricular tachycardia and cardiac arrest History of VT -In the ED patient found bradycardic heart rate 48-50. EKG shows sinus bradycardia heart rate 54.  Holding amiodarone  400 mg twice daily in the setting of bradycardia. Continue mexiletine 150 mg 3 times daily.  Acute on chronic CHF exacerbation Combined HFrEF reduced EF 40 to 45% -Patient is complaining about dyspnea.  Elevated BNP around 4500.  Chest x-ray showed cardiomegaly with central pulmonary vascular congestion. - Starting IV Lasix  20 mg daily.  Based on blood pressure tolerance volume status and urine output can readjust - Strict I's/O Daily weight and obtain echocardiogram. -Continue  spironolactone  25 mg daily. Limited other GDMT cardiac medication in the setting of hypotension.  Elevated troponin-underlying demand ischemia from CHF and recent STEMI - Previous troponin was about 24,000.  Elevated troponin 82 seems like that troponin level has been improving gradually.  Patient does not have any chest pain.  Showing sinus bradycardia heart rate 52.  At this time there is no concern for acute coronary syndrome.  AKI on CKD stage IIIb -Elevated creatinine 1.97.  Concern for prerenal acute kidney injury in the setting of acute CHF.  Check UA, urine creatinine and sodium.  Renally adjust medication.  Avoid nephrotoxic agent.  Monitor urine output  Hypothyroidism -Continue levothyroxine   Hyponatremia -Low sodium 129.  Patient has history of chronic hyponatremia sodium is around 120-130. -Hyponatremia sodium hypervolemia from underlying CHF. - Treating with IV Lasix .  Need to monitor serum sodium level  Hyperkalemia -Elevated potassium 5.3.  Hyperkalemia in the setting of AKI.  No EKG change. - due to low blood glucose avoiding IV insulin .  Treating with IV calcium gluconate and Lokelma 5 g.    Media Information   Document Information  Photos    07/02/2023 00:25  Attached To:  Hospital Encounter on 07/01/23  Source Information  Gloria Ricardo, MD  [facility]   DVT prophylaxis:  SCDs Code Status:  DNR/DNI(Do NOT Intubate).  Verified DNR form-CODE STATUS both with patient and daughter at the bedside. Diet: Clear liquid diet. Family Communication:   Family was present at bedside, at the time of interview. Opportunity was given to ask question and all questions were answered satisfactorily.  Disposition Plan: Continue to monitor development of hematemesis and melena.  Monitor H&H and transfuse as needed. Consults: GI and gastroenterology Admission status:   Inpatient, Step Down Unit  Severity of Illness: The appropriate patient status for this  patient is INPATIENT. Inpatient status is judged to be reasonable and necessary in order to provide the required intensity of service to ensure the patient's safety. The patient's presenting symptoms, physical exam findings, and initial radiographic and laboratory data in the context of their chronic comorbidities is felt to place them at high risk for further clinical deterioration. Furthermore, it is not anticipated that the patient will be medically stable for discharge from the hospital within 2 midnights of admission.   * I certify that at the point of admission it is my clinical judgment that the patient will require inpatient hospital care spanning beyond 2 midnights from the point of admission due to high intensity of service, high risk for further deterioration and high frequency of surveillance required.DEWAINE    Kieran Nachtigal, MD [facility]  How to  contact the [facility] Attending or Consulting provider 7A - 7P or covering provider during after hours 7P -7A, for this patient.  Check the care team in [facility] and look for a) attending/consulting [facility] provider listed and b) the [facility] team listed Log into [email or web address] and use [facility]'s universal password to access. If you do not have the password, please contact the hospital operator. Locate the [facility] provider you are looking for under [facility] and page to a number that you can be directly reached. If you still have difficulty reaching the provider, please page the [facility] (Director on Call) for the Hospitalists listed on amion for assistance.  07/02/2023, 12:33 AM

## 2023-07-01 NOTE — Consult Note (Signed)
 Cardiology Consultation   Patient ID: [name] MRN: [id number]; DOB: [birth year]  Admit date: 07/01/2023 Date of Consult: 07/02/2023  PCP:  [name], MD   [facility] Providers Cardiologist:  Lonni Cash, MD        Patient Profile: [name] is a 89 y.o. female with a hx of paroxysmal A-fib on Xarelto , history of TAVR with 26 mm S3 2018, ischemic cardiomyopathy with HFrEF, EF 40 to 45%, recent anterior STEMI with cardiogenic shock status post PCI to mid LAD on 06/19/2023, complicated by VT/VF arrest and salvos on dual antiarrhythmic therapy Who is being seen 07/02/2023 for the evaluation of GI bleeding and recommendation for Plavix /anticoagulation at the request of Dr. Sundil.  History of Present Illness: [name] is a 89 y.o. female with a hx of paroxysmal A-fib on Xarelto , history of TAVR with 26 mm S3 2018, ischemic cardiomyopathy with HFrEF, EF 40 to 45%, recent anterior STEMI with cardiogenic shock status post PCI to mid LAD on 06/19/2023, complicated by VT/VF arrest and salvos on dual antiarrhythmic therapy Who is being seen 07/01/2023 for the evaluation of GI bleeding and recommendation for Plavix /anticoagulation  Patient recently had complicated hospital admission 6/15-6/24/25, admitted with anterolateral STEMI complicated by cardiogenic shock status post mid LAD stent, salvos of VT/VF and brief arrest needing amiodarone , mexiletine and brief CPR.  Sent home on minimal GDMT. Comes back now with complaints of coffee-ground emesis, nausea and abdominal pain  In the ER patient is normotensive 110/56 mmHg.,  Hemoglobin is stable at 10, on discharge was 9.3.  Sodium 129, creatinine is up to 1.97 baseline was 1.7, potassium 5.3. BNP 4445, previously 338. Troponin 82. EKG normal sinus rhythm with possible early posterior fascicular block and right bundle branch block.  Recent cardiac workup as noted Echo 06/20/2023 IMPRESSIONS     1.  Systolic dysfunction is new and consistent with LAD infarct.   2. Left ventricular ejection fraction, by estimation, is 40 to 45%. The  left ventricle has mildly decreased function. The left ventricle  demonstrates regional wall motion abnormalities (see scoring  diagram/findings for description). Left ventricular  diastolic parameters are consistent with Grade II diastolic dysfunction  (pseudonormalization). Elevated left ventricular end-diastolic pressure.   3. Right ventricular systolic function is normal. The right ventricular  size is normal. There is normal pulmonary artery systolic pressure.   4. Left atrial size was moderately dilated.   5. The mitral valve is normal in structure. Trivial mitral valve  regurgitation. No evidence of mitral stenosis.   6. The aortic valve has been repaired/replaced. Aortic valve  regurgitation is mild. Mild aortic valve stenosis. There is a 26 mm valve  present in the aortic position. Echo findings are consistent with normal  structure and function of the aortic valve  prosthesis. Aortic valve area, by VTI measures 0.88 cm. Aortic valve mean  gradient measures 11.0 mmHg. Aortic valve Vmax measures 2.13 m/s.   7. The inferior vena cava is normal in size with greater than 50%  respiratory variability, suggesting right atrial pressure of 3 mmHg.   Cardiac cath 06/19/2023 Coronary angiography and intervention 06/19/2023: LM: No significant disease LAD: Mid vessel 99% stenosis with minimal flow distally, followed by mild mid 30% disease.         Large diagonal 1, ostial 90% stenosis Lcx: No significant disease RCA: Dominant vessel, no significant disease   LVEDP 15 mmHg   Successful percutaneous coronary intervention mid LAD  PTCA and stent placement 3.0 X 20 mm Synergy drug-eluting stent        Postdilatation using 3.5 x 15 mm [place] balloon up to 16 atm  Right heart catheterization 06/19/2023: RA: 10 mmHg RV: 77/10 mmHg PA: 74/25 mmHg, mPAP  45 mmHg PCW: 25 mmHg   AO sats: 97% PA sats: 57%   Fick: CO: 3.25 L/min CI: 1.94 L/min/m2   TD: CO: 2.93 L/min CI: 1.75 L/min/m2          Past Medical History:  Diagnosis Date   Arthritis    hands, knees   CAD (coronary artery disease), native coronary artery cardiologist--- dr verlin   a. mild non obst CAD per cath 07-18-2015   Carotid artery disease (HCC)    Carotid US  09/2018: Bilateral ICA 40-59 // Carotid US  11/21: Bilateral ICA 40-59; repeat 1 year    Chronic diastolic CHF (congestive heart failure) (HCC)    followed by cardiology   CKD (chronic kidney disease), stage III (HCC)    followed by pcp   Dyspnea    per pt when she walks which is usual for her   History of gastric ulcer    remote yrs ago   History of rheumatic fever as a child    History of right breast cancer 2006   s/p right partial mastectomy w/ node dissection's,  completed chemo/ radiation same year  (05-21-2020 pt stated no recurrence)   Hyperlipidemia    Hypothyroidism    followed by pcp   Osteoporosis    PMB (postmenopausal bleeding)    PSVT (paroxysmal supraventricular tachycardia) (HCC) 10/2012   in ED resolved w/ vagal maneuver    S/P aortic valve replacement 07/27/2016   for severe stenosis;  last echo in epic 09-22-2018 ef 55-60%, mild TR, aortic valve area 1.4cm^2 and mean gradiant 17 mmHg   Seasonal allergies    Vitamin D  deficiency     Past Surgical History:  Procedure Laterality Date   CARDIAC CATHETERIZATION N/A 07/18/2015   Procedure: Right/Left Heart Cath and Coronary Angiography;  Surgeon: Lonni JONETTA verlin, MD;  Location: [facility] INVASIVE CV LAB;  Service: Cardiovascular;  Laterality: N/A;   CARDIOVERSION N/A 01/01/2022   Procedure: CARDIOVERSION;  Surgeon: Hobart Powell BRAVO, MD;  Location: [facility] ENDOSCOPY;  Service: Cardiovascular;  Laterality: N/A;   CARDIOVERSION N/A 09/10/2022   Procedure: CARDIOVERSION;  Surgeon: Delford Maude BROCKS, MD;  Location: [facility] INVASIVE CV LAB;   Service: Cardiovascular;  Laterality: N/A;   CATARACT EXTRACTION W/ INTRAOCULAR LENS  IMPLANT, BILATERAL     2017   CHOLECYSTECTOMY N/A 08/06/2014   Procedure: LAPAROSCOPIC CHOLECYSTECTOMY ;  Surgeon: Donnice Bury, MD;  Location: [facility] OR;  Service: General;  Laterality: N/A;   CORONARY/GRAFT ACUTE MI REVASCULARIZATION N/A 06/19/2023   Procedure: Coronary/Graft Acute MI Revascularization;  Surgeon: Elmira Newman PARAS, MD;  Location: [facility] INVASIVE CV LAB;  Service: Cardiovascular;  Laterality: N/A;   HYSTEROSCOPY WITH D & C N/A 05/26/2020   Procedure: DILATATION AND CURETTAGE /HYSTEROSCOPY;  Surgeon: Jannis Kate Norris, MD;  Location: [facility] [place];  Service: Gynecology;  Laterality: N/A;  Request case for [facility]   LEFT HEART CATH AND CORONARY ANGIOGRAPHY N/A 06/19/2023   Procedure: LEFT HEART CATH AND CORONARY ANGIOGRAPHY;  Surgeon: Elmira Newman PARAS, MD;  Location: [facility] INVASIVE CV LAB;  Service: Cardiovascular;  Laterality: N/A;   PARTIAL MASTECTOMY WITH AXILLARY SENTINEL LYMPH NODE BIOPSY Right 2006   RIGHT HEART CATH N/A 06/19/2023   Procedure: RIGHT HEART CATH;  Surgeon:  Patwardhan, Newman PARAS, MD;  Location: [facility] INVASIVE CV LAB;  Service: Cardiovascular;  Laterality: N/A;   TEE WITHOUT CARDIOVERSION N/A 07/27/2016   Procedure: TRANSESOPHAGEAL ECHOCARDIOGRAM (TEE);  Surgeon: Verlin Lonni BIRCH, MD;  Location: [facility] OR;  Service: Open Heart Surgery;  Laterality: N/A;   TEE WITHOUT CARDIOVERSION N/A 01/01/2022   Procedure: TRANSESOPHAGEAL ECHOCARDIOGRAM (TEE);  Surgeon: Hobart Powell BRAVO, MD;  Location: [facility] ENDOSCOPY;  Service: Cardiovascular;  Laterality: N/A;   THYROID  SURGERY  1969   NODULE REMOVED, benign per pt   TONSILLECTOMY  age 65   TOTAL HIP ARTHROPLASTY Left 2003   TOTAL HIP ARTHROPLASTY Right 08/28/2013   Procedure: RIGHT TOTAL HIP ARTHROPLASTY ANTERIOR APPROACH;  Surgeon: Donnice BIRCH Car, MD;  Location: [facility] ORS;  Service: Orthopedics;  Laterality: Right;   TOTAL KNEE  ARTHROPLASTY Right 01-16-2007  @[facility]    TRANSCATHETER AORTIC VALVE REPLACEMENT, TRANSFEMORAL N/A 07/27/2016   Procedure: TRANSCATHETER AORTIC VALVE REPLACEMENT, TRANSFEMORAL;  Surgeon: Verlin Lonni BIRCH, MD;  Location: [facility] OR;  Service: Open Heart Surgery;  Laterality: N/A;     Home Medications:  Prior to Admission medications   Medication Sig Start Date End Date Taking? Authorizing Provider  acetaminophen  (TYLENOL ) 500 MG tablet Take 2 tablets (1,000 mg total) by mouth every 6 (six) hours as needed. 06/28/23 06/27/24  Lee, [place], NP  amiodarone  (PACERONE ) 400 MG tablet Take 2 tablets (400 mg) twice daily through Wednesday 07/06/23.  On Thursday 07/07/23, start taking 400 mg daily. 06/28/23   Lee, [place], NP  clopidogrel  (PLAVIX ) 75 MG tablet Take 1 tablet (75 mg total) by mouth daily. 06/28/23   Lee, [place], NP  furosemide  (LASIX ) 40 MG tablet Take 1 tablet (40 mg total) by mouth daily. 06/28/23   Lee, [place], NP  levothyroxine  (SYNTHROID , LEVOTHROID) 112 MCG tablet Take 112 mcg by mouth daily before breakfast.    [provider]  lidocaine  (LIDODERM ) 5 % Place 1 patch onto the skin daily. Remove & Discard patch within 12 hours or as directed by MD 06/28/23   Lee, [place], NP  Magnesium  400 MG CAPS Take 400 mg by mouth in the morning and at bedtime.    [provider]  mexiletine (MEXITIL ) 150 MG capsule Take 1 capsule (150 mg total) by mouth every 8 (eight) hours. 06/28/23   Lee, [place], NP  pantoprazole  (PROTONIX ) 40 MG tablet Take 1 tablet (40 mg total) by mouth daily. 06/28/23   Lee, [place], NP  potassium chloride  SA (KLOR-CON  M) 20 MEQ tablet Take 1 tablet (20 mEq total) by mouth daily. 06/28/23   Lee, [place], NP  spironolactone  (ALDACTONE ) 25 MG tablet Take 1 tablet (25 mg total) by mouth daily. 06/28/23   Lee, [place], NP  vitamin C  (ASCORBIC ACID ) 500 MG tablet Take 500 mg by mouth daily.    [provider]  XARELTO  15 MG TABS tablet TAKE 1 TABLET(15 MG) BY MOUTH DAILY WITH  SUPPER Patient taking differently: Take 15 mg by mouth daily with supper. 12/06/22   Lucien Orren SAILOR, PA-C    Scheduled Meds:  sodium chloride    Intravenous Once   ascorbic acid   500 mg Oral Daily   clopidogrel   75 mg Oral Daily   furosemide   20 mg Intravenous BID   levothyroxine   112 mcg Oral QAC breakfast   mexiletine  150 mg Oral Q8H   pantoprazole  (PROTONIX ) IV  40 mg Intravenous Q12H   potassium chloride  SA  20 mEq Oral Daily   sodium chloride  flush  3 mL Intravenous  Q12H   sodium chloride  flush  3 mL Intravenous Q12H   spironolactone   25 mg Oral Daily   Continuous Infusions:  sodium chloride      PRN Meds: sodium chloride , acetaminophen  **OR** acetaminophen , ondansetron  **OR** ondansetron  (ZOFRAN ) IV, sodium chloride  flush  Allergies:    Allergies  Allergen Reactions   Compazine [Prochlorperazine] Other (See Comments)    Unknown, pt does not remember   Fenofibrate Rash   Floxin [Ofloxacin] Other (See Comments)    Unknown, pt does not remember reaction but was allergic reaction   Statins Other (See Comments)    Myalgia - Atorvastatin, simvastatin, pitavastatin    Social History:   Social History   Socioeconomic History   Marital status: Widowed    Spouse name: Not on file   Number of children: Not on file   Years of education: Not on file   Highest education level: Not on file  Occupational History   Not on file  Tobacco Use   Smoking status: Never   Smokeless tobacco: Never   Tobacco comments:    Never smoked 08/24/22  Vaping Use   Vaping status: Never Used  Substance and Sexual Activity   Alcohol use: No   Drug use: Never   Sexual activity: Not Currently    Birth control/protection: Post-menopausal  Other Topics Concern   Not on file  Social History Narrative   Not on file   Social Drivers of Health   Financial Resource Strain: Not on file  Food Insecurity: No Food Insecurity (06/20/2023)   Hunger Vital Sign    Worried About Running Out of Food  in the Last Year: Never true    Ran Out of Food in the Last Year: Never true  Transportation Needs: No Transportation Needs (06/20/2023)   PRAPARE - Administrator, Civil Service (Medical): No    Lack of Transportation (Non-Medical): No  Physical Activity: Not on file  Stress: Not on file  Social Connections: Moderately Isolated (06/20/2023)   Social Connection and Isolation Panel    Frequency of Communication with Friends and Family: Three times a week    Frequency of Social Gatherings with Friends and Family: Not on file    Attends Religious Services: More than 4 times per year    Active Member of Clubs or Organizations: No    Attends Banker Meetings: Never    Marital Status: Widowed  Intimate Partner Violence: Not At Risk (06/20/2023)   Humiliation, Afraid, Rape, and Kick questionnaire    Fear of Current or Ex-Partner: No    Emotionally Abused: No    Physically Abused: No    Sexually Abused: No    Family History:    Family History  Problem Relation Age of Onset   Heart disease Mother    Heart disease Father      ROS:  Please see the history of present illness.   All other ROS reviewed and negative.     Physical Exam/Data: Vitals:   07/01/23 2139 07/01/23 2145 07/01/23 2230 07/02/23 0015  BP:  (!) 110/56 (!) 111/58 115/60  Pulse:  (!) 49 (!) 47 (!) 47  Resp:  20 17 20   Temp:      TempSrc:      SpO2: 100% 100% 100% 99%   No intake or output data in the 24 hours ending 07/02/23 0207    06/28/2023    6:00 AM 06/27/2023    3:51 AM 06/26/2023    4:15 AM  Last 3 Weights  Weight (lbs) 150 lb 2.1 oz 160 lb 4.4 oz 161 lb 12.8 oz  Weight (kg) 68.1 kg 72.7 kg 73.392 kg     There is no height or weight on file to calculate BMI.  General:  Well nourished, well developed, in no acute distress HEENT: normal Neck:  JVD+ Vascular: No carotid bruits; Distal pulses 2+ bilaterally Cardiac:  normal S1, S2; RRR; no murmur  Lungs: Mild bibasilar  crackles Abd: soft, nontender, no hepatomegaly  Ext: no edema Musculoskeletal:  No deformities, BUE and BLE strength normal and equal Skin: warm and dry  Neuro:  CNs 2-12 intact, no focal abnormalities noted Psych:  Normal affect    Relevant CV Studies: As noted above  Laboratory Data: High Sensitivity Troponin:   Recent Labs  Lab 06/19/23 0812 06/20/23 0139 07/01/23 2138 07/01/23 2354  TROPONINIHS 777* >24,000* 82* 85*     Chemistry Recent Labs  Lab 06/26/23 0237 06/27/23 0234 06/28/23 0257 07/01/23 2138  NA 128* 132* 133* 129*  K 4.4 3.8 4.1 5.3*  CL 96* 93* 98 98  CO2 25 27 23  20*  GLUCOSE 105* 101* 102* 127*  BUN 37* 36* 37* 41*  CREATININE 1.51* 1.63* 1.71* 1.97*  CALCIUM 8.6* 9.2 8.7* 8.6*  MG 2.1 2.0 1.9  --   GFRNONAA 33* 30* 28* 24*  ANIONGAP 7 12 12 11     Recent Labs  Lab 07/01/23 2138  PROT 6.5  ALBUMIN  2.9*  AST 36  ALT 24  ALKPHOS 84  BILITOT 1.1   Lipids No results for input(s): CHOL, TRIG, HDL, LABVLDL, LDLCALC, CHOLHDL in the last 168 hours.  Hematology Recent Labs  Lab 06/27/23 0234 06/28/23 0257 07/01/23 2138 07/01/23 2354  WBC 6.3 6.8 9.2  --   RBC 2.76* 2.72* 2.85*  --   HGB 9.5* 9.3* 10.0* 9.7*  HCT 28.7* 28.4* 31.0* 30.3*  MCV 104.0* 104.4* 108.8*  --   MCH 34.4* 34.2* 35.1*  --   MCHC 33.1 32.7 32.3  --   RDW 13.8 14.1 15.8*  --   PLT 255 292 338  --    Thyroid  No results for input(s): TSH, FREET4 in the last 168 hours.  BNP Recent Labs  Lab 07/01/23 2138  BNP 4,445.8*    DDimer No results for input(s): DDIMER in the last 168 hours.  Radiology/Studies:  DG Chest Portable 1 View Result Date: 07/01/2023 CLINICAL DATA:  dyspnea EXAM: PORTABLE CHEST - 1 VIEW COMPARISON:  June 21, 2023 FINDINGS: Central pulmonary vascular congestion. No focal airspace consolidation, pleural effusion, or pneumothorax. Mild cardiomegaly. Endovascular aortic valve replacement. Aortic atherosclerosis.No acute fracture or  destructive lesion. IMPRESSION: Mild cardiomegaly with central pulmonary vascular congestion, unchanged. Electronically Signed   By: Rogelia Myers M.D.   On: 07/01/2023 21:48     Assessment and Plan: Acute GI bleeding CAD s/p recent PCI to mid LAD in the setting of anterior STEMI and cardiogenic shock 06/19/2023 Acute on chronic HFrEF, EF 40 to 45%. 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 Hyponatremia likely hypervolemic. Status post TAVR 2018. Paroxysmal A-fib on Xarelto  Multiple other comorbidities as noted  Plan: - GI consulted, hold Xarelto .  Given fresh stent on 06/19/2023, I would recommend continue Plavix  75 mg daily to avoid in-stent thrombosis (given patient presented with STEMI status post stent, although location is mid LAD) - Okay to hold Xarelto , probably will not be a good candidate for long-term given GI bleeding now. --> IV  Lasix  20 mg now and from tomorrow 20 mg twice daily. -->Serial hemoglobin check, PPI, GI consultation. Continue home medication of amiodarone , mexiletine, spironolactone   We will follow  Risk Assessment/Risk Scores:       New [place]  Heart Association (NYHA) Functional Class NYHA Class IV  CHA2DS2-VASc Score = 4   This indicates a 4.8% annual risk of stroke. The patient's score is based upon: CHF History: 1 HTN History: 0 Diabetes History: 0 Stroke History: 0 Vascular Disease History: 0 Age Score: 2 Gender Score: 1        For questions or updates, please contact  [facility] Please consult [email or web address] for contact info under    Signed, Grayce Bold, MD  07/02/2023 2:07 AM

## 2023-07-02 DIAGNOSIS — N1832 Chronic kidney disease, stage 3b: Secondary | ICD-10-CM

## 2023-07-02 DIAGNOSIS — I5023 Acute on chronic systolic (congestive) heart failure: Secondary | ICD-10-CM | POA: Diagnosis not present

## 2023-07-02 DIAGNOSIS — I472 Ventricular tachycardia, unspecified: Secondary | ICD-10-CM

## 2023-07-02 DIAGNOSIS — I5042 Chronic combined systolic (congestive) and diastolic (congestive) heart failure: Secondary | ICD-10-CM

## 2023-07-02 DIAGNOSIS — I480 Paroxysmal atrial fibrillation: Secondary | ICD-10-CM | POA: Diagnosis not present

## 2023-07-02 DIAGNOSIS — R195 Other fecal abnormalities: Secondary | ICD-10-CM

## 2023-07-02 DIAGNOSIS — K922 Gastrointestinal hemorrhage, unspecified: Secondary | ICD-10-CM | POA: Diagnosis not present

## 2023-07-02 DIAGNOSIS — Z7902 Long term (current) use of antithrombotics/antiplatelets: Secondary | ICD-10-CM

## 2023-07-02 DIAGNOSIS — I2510 Atherosclerotic heart disease of native coronary artery without angina pectoris: Secondary | ICD-10-CM

## 2023-07-02 DIAGNOSIS — K920 Hematemesis: Secondary | ICD-10-CM

## 2023-07-02 LAB — COMPREHENSIVE METABOLIC PANEL WITH GFR
ALT: 34 U/L (ref 0–44)
AST: 47 U/L — ABNORMAL HIGH (ref 15–41)
Albumin: 2.7 g/dL — ABNORMAL LOW (ref 3.5–5.0)
Alkaline Phosphatase: 87 U/L (ref 38–126)
Anion gap: 8 (ref 5–15)
BUN: 40 mg/dL — ABNORMAL HIGH (ref 8–23)
CO2: 25 mmol/L (ref 22–32)
Calcium: 8.8 mg/dL — ABNORMAL LOW (ref 8.9–10.3)
Chloride: 98 mmol/L (ref 98–111)
Creatinine, Ser: 1.91 mg/dL — ABNORMAL HIGH (ref 0.44–1.00)
GFR, Estimated: 25 mL/min — ABNORMAL LOW (ref >60)
Glucose, Bld: 94 mg/dL (ref 70–99)
Potassium: 4.7 mmol/L (ref 3.5–5.1)
Sodium: 131 mmol/L — ABNORMAL LOW (ref 135–145)
Total Bilirubin: 1.5 mg/dL — ABNORMAL HIGH (ref 0.0–1.2)
Total Protein: 6.5 g/dL (ref 6.5–8.1)

## 2023-07-02 LAB — RETICULOCYTES
Immature Retic Fract: 34.9 % — ABNORMAL HIGH (ref 2.3–15.9)
RBC.: 2.76 MIL/uL — ABNORMAL LOW (ref 3.87–5.11)
Retic Count, Absolute: 176.1 10*3/uL (ref 19.0–186.0)
Retic Ct Pct: 6.4 % — ABNORMAL HIGH (ref 0.4–3.1)

## 2023-07-02 LAB — FOLATE: Folate: 11.3 ng/mL (ref >5.9)

## 2023-07-02 LAB — URINALYSIS, ROUTINE W REFLEX MICROSCOPIC
Bilirubin Urine: NEGATIVE
Glucose, UA: NEGATIVE mg/dL
Hgb urine dipstick: NEGATIVE
Ketones, ur: NEGATIVE mg/dL
Leukocytes,Ua: NEGATIVE
Nitrite: NEGATIVE
Protein, ur: NEGATIVE mg/dL
Specific Gravity, Urine: 1.012 (ref 1.005–1.030)
pH: 5 (ref 5.0–8.0)

## 2023-07-02 LAB — APTT: aPTT: 60 s — ABNORMAL HIGH (ref 24–36)

## 2023-07-02 LAB — TROPONIN I (HIGH SENSITIVITY): Troponin I (High Sensitivity): 85 ng/L — ABNORMAL HIGH (ref <18)

## 2023-07-02 LAB — PROTIME-INR
INR: 3.6 — ABNORMAL HIGH (ref 0.8–1.2)
Prothrombin Time: 37.3 s — ABNORMAL HIGH (ref 11.4–15.2)

## 2023-07-02 LAB — FERRITIN: Ferritin: 241 ng/mL (ref 11–307)

## 2023-07-02 LAB — CBG MONITORING, ED: Glucose-Capillary: 99 mg/dL (ref 70–99)

## 2023-07-02 LAB — CBC
HCT: 30.2 % — ABNORMAL LOW (ref 36.0–46.0)
Hemoglobin: 9.8 g/dL — ABNORMAL LOW (ref 12.0–15.0)
MCH: 35 pg — ABNORMAL HIGH (ref 26.0–34.0)
MCHC: 32.5 g/dL (ref 30.0–36.0)
MCV: 107.9 fL — ABNORMAL HIGH (ref 80.0–100.0)
Platelets: 328 10*3/uL (ref 150–400)
RBC: 2.8 MIL/uL — ABNORMAL LOW (ref 3.87–5.11)
RDW: 15.8 % — ABNORMAL HIGH (ref 11.5–15.5)
WBC: 7.7 10*3/uL (ref 4.0–10.5)
nRBC: 1 % — ABNORMAL HIGH (ref 0.0–0.2)

## 2023-07-02 LAB — IRON AND TIBC
Iron: 67 ug/dL (ref 28–170)
Saturation Ratios: 23 % (ref 10.4–31.8)
TIBC: 291 ug/dL (ref 250–450)
UIBC: 224 ug/dL

## 2023-07-02 LAB — VITAMIN B12: Vitamin B-12: 228 pg/mL (ref 180–914)

## 2023-07-02 LAB — HEMOGLOBIN AND HEMATOCRIT, BLOOD
HCT: 30.3 % — ABNORMAL LOW (ref 36.0–46.0)
Hemoglobin: 9.7 g/dL — ABNORMAL LOW (ref 12.0–15.0)

## 2023-07-02 LAB — SODIUM, URINE, RANDOM: Sodium, Ur: 37 mmol/L

## 2023-07-02 LAB — CREATININE, URINE, RANDOM: Creatinine, Urine: 48 mg/dL

## 2023-07-02 MED ORDER — AMIODARONE HCL 200 MG PO TABS
200.0000 mg | ORAL_TABLET | Freq: Every day | ORAL | Status: DC
  Administered 2023-07-03 – 2023-07-04 (×2): 200 mg via ORAL
  Filled 2023-07-02 (×2): qty 1

## 2023-07-02 MED ORDER — FUROSEMIDE 10 MG/ML IJ SOLN
20.0000 mg | Freq: Two times a day (BID) | INTRAMUSCULAR | Status: DC
  Administered 2023-07-02 – 2023-07-03 (×3): 20 mg via INTRAVENOUS
  Filled 2023-07-02 (×3): qty 2

## 2023-07-02 MED ORDER — FUROSEMIDE 10 MG/ML IJ SOLN: 20.0000 mg | Freq: Two times a day (BID) | INTRAMUSCULAR | Status: DC

## 2023-07-02 NOTE — Hospital Course (Signed)
 [name] was admitted to the hospital with the working diagnosis of upper GI bleed.   89 yo female with the past medical history of coronary artery disease,paroxysmal atrial fibrillation, SVT, heart failure, CKD, sp TAVR (2018), who presented with coffee ground emesis.  Recent  hospitalization 06/15 to 06/28/23 for STEMI. She underwent emergent cardiac catheterization, and had a drug eluding stent placed to her LAD. Hospitalization complicated with VT/VF arrest, requiring brief CPR. She was discharge to SNF in a stable condition, plan to continue clopidogrel  and rivaroxaban .  At SNF patient had one episode of coffee ground emesis, preceded by abdominal pain and nausea. No melena or hematochezia. No hematemesis. EMS was called and patient was transported to the ED.  On her initial physical examination her blood pressure was 104/88, HR 49, RR 17 and 02 saturation 100%, ill appearing  Lungs with no wheezing or rhonchi, heart with S1 and S2 present and regular, bradycardic, abdomen with no distention, soft and non tender, no lower extremity edema   Na 129, K 5.3 CL 98 bicarbonate 20, glucose 127 bun 41 cr 1,97  BNP 4,445  High sensitive troponin 82 and 85  Wbc 9,7 hgb 10,0 plt 338   Chest radiograph with left rotation, hypoinflation, cardiomegaly, mild bilateral hilar vascular congestion with no effusions or infiltrates.   EKG 50 bpm, right axis deviation, qtc 451, right bundle branch block, sinus rhythm with no significant ST segment changes, negative T wave V1 to V3.   Patient was placed on IV pantoprazole .

## 2023-07-02 NOTE — Progress Notes (Signed)
  Progress Note  Patient Name: [name] Date of Encounter: 07/02/2023 [facility] Cardiologist: Lonni Cash, MD   Interval Summary   She has not had any further nausea or vomiting.  Able to lie fully supine in bed without respiratory difficulty. Hgb holding steady. Hyponatremia and kidney dysfunction with marginal improvement.  Vital Signs Vitals:   07/02/23 0315 07/02/23 0500 07/02/23 0600 07/02/23 0631  BP: 115/61 120/66 105/67   Pulse: (!) 48 (!) 51 (!) 50   Resp: 20 (!) 21 20   Temp:    98.6 F (37 C)  TempSrc:    Axillary  SpO2: 100% 99% 98%     Intake/Output Summary (Last 24 hours) at 07/02/2023 0914 Last data filed at 07/02/2023 0846 Gross per 24 hour  Intake --  Output 275 ml  Net -275 ml      06/28/2023    6:00 AM 06/27/2023    3:51 AM 06/26/2023    4:15 AM  Last 3 Weights  Weight (lbs) 150 lb 2.1 oz 160 lb 4.4 oz 161 lb 12.8 oz  Weight (kg) 68.1 kg 72.7 kg 73.392 kg      Telemetry/ECG  Sinus rhythm- Personally Reviewed  Physical Exam  GEN: No acute distress.   Neck: Plethoric JVD Cardiac: RRR, no murmurs, rubs, or gallops.  Respiratory: Clear to auscultation bilaterally. GI: Soft, nontender, non-distended  MS: 1+ peripheral edema, symmetrically  Assessment & Plan  89 y.o. female with a hx of paroxysmal A-fib on Xarelto , history of TAVR (26 mm S3 2018), ischemic cardiomyopathy with HFrEF (EF 40 to 45%), recent anterior STEMI with cardiogenic shock s/p PCI to mid LAD on 06/19/2023, complicated by VT/VF arrest presents with coffee-ground emesis while on triple therapy with aspirin /clopidogrel /Xarelto .  Patient and family very reluctant to consider EGD due to her numerous comorbid conditions and age.  Comforted by the stable hemoglobin and the absence of recurrent vomiting.  Aspirin  and Xarelto  have been discontinued.  For time being plan clopidogrel  monotherapy.  If direct oral anticoagulant is reinitiated, prefer Eliquis  due to lower  GI bleeding risk.  Amiodarone  has been stopped. That may not be wise. Will restart at the maintenance, rather than the loading dose.  Will monitor for any recurrence of ventricular arrhythmia.  Note that mexiletine may also be contributing to nausea/vomiting.  Hyponatremia and acute worsening of renal insufficiency showing slight improvement compared to last night.  Continue low-dose loop diuretic for clinical evidence of hypervolemia.   For questions or updates, please contact [facility] Please consult [email or web address] for contact info under       Signed, Jerel Balding, MD

## 2023-07-02 NOTE — Assessment & Plan Note (Signed)
 Continue levothyroxine 

## 2023-07-02 NOTE — ED Notes (Signed)
Pt has had no urine output.

## 2023-07-02 NOTE — Progress Notes (Signed)
 Progress Note   Patient: [name] FMW:[id number] DOB: 1931-05-06 DOA: 07/01/2023     1 DOS: the patient was seen and examined on 07/02/2023   Brief hospital course: Mrs. Helinski was admitted to the hospital with the working diagnosis of upper GI bleed.   89 yo female with the past medical history of coronary artery disease,paroxysmal atrial fibrillation, SVT, heart failure, CKD, sp TAVR (2018), who presented with coffee ground emesis.  Recent  hospitalization 06/15 to 06/28/23 for STEMI. She underwent emergent cardiac catheterization, and had a drug eluding stent placed to her LAD. Hospitalization complicated with VT/VF arrest, requiring brief CPR. She was discharge to SNF in a stable condition, plan to continue clopidogrel  and rivaroxaban .  At SNF patient had one episode of coffee ground emesis, preceded by abdominal pain and nausea. No melena or hematochezia. No hematemesis. EMS was called and patient was transported to the ED.  On her initial physical examination her blood pressure was 104/88, HR 49, RR 17 and 02 saturation 100%, ill appearing  Lungs with no wheezing or rhonchi, heart with S1 and S2 present and regular, bradycardic, abdomen with no distention, soft and non tender, no lower extremity edema   Na 129, K 5.3 CL 98 bicarbonate 20, glucose 127 bun 41 cr 1,97  BNP 4,445  High sensitive troponin 82 and 85  Wbc 9,7 hgb 10,0 plt 338   Chest radiograph with left rotation, hypoinflation, cardiomegaly, mild bilateral hilar vascular congestion with no effusions or infiltrates.   EKG 50 bpm, right axis deviation, qtc 451, right bundle branch block, sinus rhythm with no significant ST segment changes, negative T wave V1 to V3.   Patient was placed on IV pantoprazole .   Assessment and Plan: * Acute upper GI bleeding No further sings of bleeding Her hgb has been stable.   Plan to continue conservative care with IV pantoprazole .  Clear liquids and as needed antiemetics.   Follow H&H Follow up with GI recommendations, patient and family would like to avoid invasive procedures.   Acute on chronic systolic CHF (congestive heart failure) (HCC) Echocardiogram with reduced LV systolic function EF 40 to 45%, RV systolic function preserved, LA with moderate dilatation, sp TAVR. Hypokinetic entire septum, apical anterior segment and apex.   Continue diuresis with furosemide  to keep negative fluid balance.  Continue spironolactone .  Limited medical therapy due to risk of hypotension   History of ventricular tachycardia and ventricular fibrillation, continue with amiodarone    CAD (coronary artery disease), native coronary artery Recent STMI and drug eluding stent placement  Risk vs benefit will continue with clopidogrel  Patient with no chest pain,  Continue statin therapy   Paroxysmal atrial fibrillation (HCC) Continue amiodarone  and mexiletine  Hold on anticoagulation with DOAC due to acute bleeding Continue telemetry monitoring   Chronic kidney disease, stage 3b (HCC) AKI, hyponatremia. Hyperkalemia   Renal function with serum cr at 1.91 with K at 4,7 and serum bicarbonate at 25  Na 131  Continue with diuresis.  Follow up renal function and electrolytes Avoid hypotension and nephrotoxic medications Hold on Kcl for now and add as needed. Keep K above 4   Hypothyroidism Continue levothyroxine        Subjective: patient is feeling fatigue and dyspnea at rest, no chest pain, no further nausea or vomiting, no abdominal pain, very weak and deconditioned   Physical Exam: Vitals:   07/02/23 0631 07/02/23 0900 07/02/23 1021 07/02/23 1100  BP:  110/63 (!) 109/54 112/62  Pulse:  ROLLEN)  53 (!) 47 (!) 54  Resp:   20   Temp: 98.6 F (37 C) 98.5 F (36.9 C) (!) 97.4 F (36.3 C)   TempSrc: Axillary Oral Oral   SpO2:  99% 99% 99%   Neurology awake and alert, deconditioned and ill looking appearing ENT with mild pallor with no icterus Cardiovascular with  S1 and S2 present and regular with no gallops, rubs or mumurs Positive JVD Positive lower extremity edema + to ++ Respiratory with poor inspiratory effort with no wheezing, or rhonchi, positive rales at bases Abdomen with no distention  Data Reviewed:   Family Communication: I spoke with patient's daughter at the bedside, we talked in detail about patient's condition, plan of care and prognosis and all questions were addressed.  Disposition: Status is: Inpatient Remains inpatient appropriate because: IV pantoprazole  and diuresis   Planned Discharge Destination: Skilled nursing facility    Author: Elidia Toribio Furnace, MD 07/02/2023 12:58 PM  For on call review [email or web address].

## 2023-07-02 NOTE — Assessment & Plan Note (Addendum)
 Continue amiodarone  and mexiletine  Hold on anticoagulation with DOAC due to acute bleeding Continue telemetry monitoring

## 2023-07-02 NOTE — Assessment & Plan Note (Signed)
 Recent STMI and drug eluding stent placement  Risk vs benefit will continue with clopidogrel  Patient with no chest pain,  Continue statin therapy

## 2023-07-02 NOTE — ED Notes (Signed)
 Pt resting, no distress noted. Awakens easily to voice. Family at bedside. Pt checked, brief dry.

## 2023-07-02 NOTE — Assessment & Plan Note (Signed)
 No further sings of bleeding Her hgb has been stable.   Plan to continue conservative care with IV pantoprazole .  Clear liquids and as needed antiemetics.  Follow H&H Follow up with GI recommendations, patient and family would like to avoid invasive procedures.

## 2023-07-02 NOTE — Assessment & Plan Note (Addendum)
 AKI, hyponatremia. Hyperkalemia   Renal function with serum cr at 1.91 with K at 4,7 and serum bicarbonate at 25  Na 131  Continue with diuresis.  Follow up renal function and electrolytes Avoid hypotension and nephrotoxic medications Hold on Kcl for now and add as needed. Keep K above 4

## 2023-07-02 NOTE — Consult Note (Signed)
 Consultation Note   Referring Provider:  [facility] PCP: [name], [name] Primary Gastroenterologist:  [name].        Reason for Consultation: GI bleed DOA: 07/01/2023         Hospital Day: 2   ASSESSMENT    89 year old female with a history of CAD / STEMI  on 06/19/23,  ischemic cardiomyopathy, aortic stenosis status post TAVR, paroxysmal atrial fibrillation, pulmonary hypertension, CKD 3. Admitted for possible upper GI bleed.   CAD , recent STEMI s/p DES. On plavix .  Recent VT/VF arrest  Acute on chronic HFrEF, EF 40 to 45%  Possible upper GI bleed with 1 episode of dark emesis yesterday  / FOBT+ on Plavix  and Xarelto  Patient denies dark stool.  Hemodynamically stable except for bradycardia . Hemoglobin at baseline.   Mild elevation in total bilirubin and AST.  LFTs previously normal  Chronic macrocytic anemia Hemoglobin  9.8 which is near her baseline  Atrial fibrillation, on Xarelto   CKD stage III  See PMH for additional history  Principal Problem:   GI bleeding Active Problems:   Hypothyroidism   History of transcatheter aortic valve replacement (TAVR)   GI bleed   History of ventricular tachycardia   Acute heart failure with reduced ejection fraction (HFrEF, <= 40%) (HCC)   Acute kidney injury superimposed on chronic kidney disease (HCC)   Hyperkalemia   Sinus bradycardia     PLAN:   --B12, folate and iron studies pending --Monitor H&H --AM LFTs --Cardiology has evaluated.  Plavix  needs to be continued to avoid in-stent thrombosis.  Xarelto  on hold for now, may not be candidate for long-term use given ? GI bleed  -- Patient is at high risk for sedation/procedures with recent STEMI /heart failure.  This was discussed with the patient and her daughter [name] in the room.  They would both like to avoid endoscopic evaluation unless absolutely necessary.  Thankfully there is no signs of ongoing GI  bleeding at this time.  Reasonable to monitor for overt bleeding and trend H&H for now -- Clear liquid diet -- Twice daily IV PPI.  After this consultation was completed it was determined that patient has an [facility] PCP.  It is possible that we will transition her care to [facility]     HPI   89 year old female with a history of CAD / STEMI  on 06/19/23,  ischemic cardiomyopathy, aortic stenosis status post TAVR, paroxysmal atrial fibrillation, pulmonary hypertension, CKD 3B,   [name] was hospitalized earlier this month with a STEM she underwent cardiac cath and DES placement.  She developed cardiogenic shock and cardiac arrest requiring brief CPR.  She was discharged on 06/28/2023  Patient brought to ED yesterday from [facility] nursing [facility] for evaluation of coffee-ground emesis on plavix  and xarelto  .  It was reported that she had dark stool but I clarified this with the patient and her daughter and they denied any dark stools prior to coming to the hospital or since being here in the hospital.  Patient has no prior history of gastrointestinal bleeding.  She had no nausea or vomiting preceding the episode of dark emesis.  She has been having some intermittent mid upper abdominal discomfort.  Description of  the discomfort is vague and possibly related to constipation and or coughing.   No prior GI bleeds. No history of colonoscopy    Labs and Imaging:  Recent Labs    07/01/23 2138 07/02/23 0600  PROT 6.5 6.5  ALBUMIN  2.9* 2.7*  AST 36 47*  ALT 24 34  ALKPHOS 84 87  BILITOT 1.1 1.5*   Recent Labs    07/01/23 2138 07/01/23 2354 07/02/23 0600  WBC 9.2  --  7.7  HGB 10.0* 9.7* 9.8*  HCT 31.0* 30.3* 30.2*  MCV 108.8*  --  107.9*  PLT 338  --  328   Recent Labs    07/01/23 2138 07/02/23 0600  NA 129* 131*  K 5.3* 4.7  CL 98 98  CO2 20* 25  GLUCOSE 127* 94  BUN 41* 40*  CREATININE 1.97* 1.91*  CALCIUM 8.6* 8.8*     DG Chest Portable 1 View CLINICAL DATA:   dyspnea  EXAM: PORTABLE CHEST - 1 VIEW  COMPARISON:  June 21, 2023  FINDINGS: Central pulmonary vascular congestion. No focal airspace consolidation, pleural effusion, or pneumothorax. Mild cardiomegaly. Endovascular aortic valve replacement. Aortic atherosclerosis.No acute fracture or destructive lesion.  IMPRESSION: Mild cardiomegaly with central pulmonary vascular congestion, unchanged.  Electronically Signed   By: [name] M.D.   On: 07/01/2023 21:48  Past Medical History:  Diagnosis Date   Arthritis    hands, knees   CAD (coronary artery disease), native coronary artery cardiologist--- dr verlin   a. mild non obst CAD per cath 07-18-2015   Carotid artery disease (HCC)    Carotid US  09/2018: Bilateral ICA 40-59 // Carotid US  11/21: Bilateral ICA 40-59; repeat 1 year    Chronic diastolic CHF (congestive heart failure) (HCC)    followed by cardiology   CKD (chronic kidney disease), stage III (HCC)    followed by pcp   Dyspnea    per pt when she walks which is usual for her   History of gastric ulcer    remote yrs ago   History of rheumatic fever as a child    History of right breast cancer 2006   s/p right partial mastectomy w/ node dissection's,  completed chemo/ radiation same year  (05-21-2020 pt stated no recurrence)   Hyperlipidemia    Hypothyroidism    followed by pcp   Osteoporosis    PMB (postmenopausal bleeding)    PSVT (paroxysmal supraventricular tachycardia) (HCC) 10/2012   in ED resolved w/ vagal maneuver    S/P aortic valve replacement 07/27/2016   for severe stenosis;  last echo in epic 09-22-2018 ef 55-60%, mild TR, aortic valve area 1.4cm^2 and mean gradiant 17 mmHg   Seasonal allergies    Vitamin D  deficiency     Past Surgical History:  Procedure Laterality Date   CARDIAC CATHETERIZATION N/A 07/18/2015   Procedure: Right/Left Heart Cath and Coronary Angiography;  Surgeon: Lonni JONETTA verlin, [name];  Location: [facility] INVASIVE CV LAB;   Service: Cardiovascular;  Laterality: N/A;   CARDIOVERSION N/A 01/01/2022   Procedure: CARDIOVERSION;  Surgeon: Hobart Powell BRAVO, [name];  Location: [facility] ENDOSCOPY;  Service: Cardiovascular;  Laterality: N/A;   CARDIOVERSION N/A 09/10/2022   Procedure: CARDIOVERSION;  Surgeon: Delford Maude BROCKS, [name];  Location: [facility] INVASIVE CV LAB;  Service: Cardiovascular;  Laterality: N/A;   CATARACT EXTRACTION W/ INTRAOCULAR LENS  IMPLANT, BILATERAL     2017   CHOLECYSTECTOMY N/A 08/06/2014   Procedure: LAPAROSCOPIC CHOLECYSTECTOMY ;  Surgeon: Donnice Bury, [name];  Location: [facility]  OR;  Service: General;  Laterality: N/A;   CORONARY/GRAFT ACUTE MI REVASCULARIZATION N/A 06/19/2023   Procedure: Coronary/Graft Acute MI Revascularization;  Surgeon: Elmira Newman PARAS, [name];  Location: [facility] INVASIVE CV LAB;  Service: Cardiovascular;  Laterality: N/A;   HYSTEROSCOPY WITH D & C N/A 05/26/2020   Procedure: DILATATION AND CURETTAGE /HYSTEROSCOPY;  Surgeon: Jannis Kate Norris, [name];  Location: [facility];  Service: Gynecology;  Laterality: N/A;  Request case for [facility]   LEFT HEART CATH AND CORONARY ANGIOGRAPHY N/A 06/19/2023   Procedure: LEFT HEART CATH AND CORONARY ANGIOGRAPHY;  Surgeon: Elmira Newman PARAS, [name];  Location: [facility] INVASIVE CV LAB;  Service: Cardiovascular;  Laterality: N/A;   PARTIAL MASTECTOMY WITH AXILLARY SENTINEL LYMPH NODE BIOPSY Right 2006   RIGHT HEART CATH N/A 06/19/2023   Procedure: RIGHT HEART CATH;  Surgeon: Elmira Newman PARAS, [name];  Location: [facility] INVASIVE CV LAB;  Service: Cardiovascular;  Laterality: N/A;   TEE WITHOUT CARDIOVERSION N/A 07/27/2016   Procedure: TRANSESOPHAGEAL ECHOCARDIOGRAM (TEE);  Surgeon: Verlin Lonni BIRCH, [name];  Location[facility] OR;  Service: Open Heart Surgery;  Laterality: N/A;   TEE WITHOUT CARDIOVERSION N/A 01/01/2022   Procedure: TRANSESOPHAGEAL ECHOCARDIOGRAM (TEE);  Surgeon: Hobart Powell BRAVO, [name];  Location: [facility] ENDOSCOPY;  Service: Cardiovascular;  Laterality: N/A;    THYROID  SURGERY  1969   NODULE REMOVED, benign per pt   TONSILLECTOMY  age 68   TOTAL HIP ARTHROPLASTY Left 2003   TOTAL HIP ARTHROPLASTY Right 08/28/2013   Procedure: RIGHT TOTAL HIP ARTHROPLASTY ANTERIOR APPROACH;  Surgeon: Donnice BIRCH Car, [name];  Location: [facility] ORS;  Service: Orthopedics;  Laterality: Right;   TOTAL KNEE ARTHROPLASTY Right 01-16-2007  @[facility]    TRANSCATHETER AORTIC VALVE REPLACEMENT, TRANSFEMORAL N/A 07/27/2016   Procedure: TRANSCATHETER AORTIC VALVE REPLACEMENT, TRANSFEMORAL;  Surgeon: Verlin Lonni BIRCH, [name];  Location: [facility] OR;  Service: Open Heart Surgery;  Laterality: N/A;    Family History  Problem Relation Age of Onset   Heart disease Mother    Heart disease Father     Prior to Admission medications   Medication Sig Start Date End Date Taking? Authorizing Provider  acetaminophen  (TYLENOL ) 500 MG tablet Take 2 tablets (1,000 mg total) by mouth every 6 (six) hours as needed. 06/28/23 06/27/24 Yes Lee, [place], NP  acetaminophen  (TYLENOL ) 500 MG tablet Take 1,000 mg by mouth 2 (two) times daily.   Yes Provider, Historical, [name]  amiodarone  (PACERONE ) 400 MG tablet Take 2 tablets (400 mg) twice daily through Wednesday 07/06/23.  On Thursday 07/07/23, start taking 400 mg daily. 06/28/23  Yes Lee, [place], NP  clopidogrel  (PLAVIX ) 75 MG tablet Take 1 tablet (75 mg total) by mouth daily. 06/28/23  Yes Lee, [place], NP  furosemide  (LASIX ) 40 MG tablet Take 1 tablet (40 mg total) by mouth daily. 06/28/23  Yes Lee, [place], NP  levothyroxine  (SYNTHROID , LEVOTHROID) 112 MCG tablet Take 112 mcg by mouth daily before breakfast.   Yes Provider, Historical, [name]  lidocaine  (LIDODERM ) 5 % Place 1 patch onto the skin daily. Remove & Discard patch within 12 hours or as directed by [name] 06/28/23  Yes Lee, [place], NP  Magnesium  400 MG CAPS Take 400 mg by mouth in the morning and at bedtime.   Yes Provider, Historical, [name]  mexiletine (MEXITIL ) 150 MG capsule Take 1 capsule (150 mg total) by mouth every 8  (eight) hours. 06/28/23  Yes Lee, [place], NP  pantoprazole  (PROTONIX ) 40 MG tablet Take 1 tablet (40 mg total) by mouth daily. 06/28/23  Yes Jama,  [place], NP  potassium chloride  SA (KLOR-CON  M) 20 MEQ tablet Take 1 tablet (20 mEq total) by mouth daily. 06/28/23  Yes Lee, [place], NP  spironolactone  (ALDACTONE ) 25 MG tablet Take 1 tablet (25 mg total) by mouth daily. 06/28/23  Yes Lee, [place], NP  vitamin C  (ASCORBIC ACID ) 500 MG tablet Take 500 mg by mouth daily.   Yes Provider, Historical, [name]  XARELTO  15 MG TABS tablet TAKE 1 TABLET(15 MG) BY MOUTH DAILY WITH SUPPER 12/06/22  Yes Lucien, Tessa N, PA-C    Current Facility-Administered Medications  Medication Dose Route Frequency Provider Last Rate Last Admin   0.9 %  sodium chloride  infusion (Manually program via Guardrails IV Fluids)   Intravenous Once Sundil, Subrina, [name]   Held at 07/02/23 0034   0.9 %  sodium chloride  infusion  250 mL Intravenous PRN Sundil, Subrina, [name]       acetaminophen  (TYLENOL ) tablet 650 mg  650 mg Oral Q6H PRN Sundil, Subrina, [name]       Or   acetaminophen  (TYLENOL ) suppository 650 mg  650 mg Rectal Q6H PRN Sundil, Subrina, [name]       ascorbic acid  (VITAMIN C ) tablet 500 mg  500 mg Oral Daily Sundil, Subrina, [name]       clopidogrel  (PLAVIX ) tablet 75 mg  75 mg Oral Daily Sundil, Subrina, [name]       furosemide  (LASIX ) injection 20 mg  20 mg Intravenous BID Sundil, Subrina, [name]   20 mg at 07/02/23 9153   levothyroxine  (SYNTHROID ) tablet 112 mcg  112 mcg Oral QAC breakfast Sundil, Subrina, [name]   112 mcg at 07/02/23 0846   mexiletine (MEXITIL ) capsule 150 mg  150 mg Oral Q8H Sundil, Subrina, [name]   150 mg at 07/02/23 9343   ondansetron  (ZOFRAN ) tablet 4 mg  4 mg Oral Q6H PRN Sundil, Subrina, [name]       Or   ondansetron  (ZOFRAN ) injection 4 mg  4 mg Intravenous Q6H PRN Sundil, Subrina, [name]       pantoprazole  (PROTONIX ) injection 40 mg  40 mg Intravenous Q12H Sundil, Subrina, [name]       potassium chloride  SA (KLOR-CON  M) CR tablet 20 mEq  20 mEq  Oral Daily Sundil, Subrina, [name]       sodium chloride  flush (NS) 0.9 % injection 3 mL  3 mL Intravenous Q12H Sundil, Subrina, [name]       sodium chloride  flush (NS) 0.9 % injection 3 mL  3 mL Intravenous Q12H Sundil, Subrina, [name]   3 mL at 07/02/23 0030   sodium chloride  flush (NS) 0.9 % injection 3 mL  3 mL Intravenous PRN Sundil, Subrina, [name]       spironolactone  (ALDACTONE ) tablet 25 mg  25 mg Oral Daily Sundil, Subrina, [name]       Current Outpatient Medications  Medication Sig Dispense Refill   acetaminophen  (TYLENOL ) 500 MG tablet Take 2 tablets (1,000 mg total) by mouth every 6 (six) hours as needed. 100 tablet 2   acetaminophen  (TYLENOL ) 500 MG tablet Take 1,000 mg by mouth 2 (two) times daily.     amiodarone  (PACERONE ) 400 MG tablet Take 2 tablets (400 mg) twice daily through Wednesday 07/06/23.  On Thursday 07/07/23, start taking 400 mg daily.     clopidogrel  (PLAVIX ) 75 MG tablet Take 1 tablet (75 mg total) by mouth daily.     furosemide  (LASIX ) 40 MG tablet Take 1 tablet (40 mg total) by mouth daily.     levothyroxine  (SYNTHROID , LEVOTHROID)  112 MCG tablet Take 112 mcg by mouth daily before breakfast.     lidocaine  (LIDODERM ) 5 % Place 1 patch onto the skin daily. Remove & Discard patch within 12 hours or as directed by [name]     Magnesium  400 MG CAPS Take 400 mg by mouth in the morning and at bedtime.     mexiletine (MEXITIL ) 150 MG capsule Take 1 capsule (150 mg total) by mouth every 8 (eight) hours.     pantoprazole  (PROTONIX ) 40 MG tablet Take 1 tablet (40 mg total) by mouth daily.     potassium chloride  SA (KLOR-CON  M) 20 MEQ tablet Take 1 tablet (20 mEq total) by mouth daily.     spironolactone  (ALDACTONE ) 25 MG tablet Take 1 tablet (25 mg total) by mouth daily.     vitamin C  (ASCORBIC ACID ) 500 MG tablet Take 500 mg by mouth daily.     XARELTO  15 MG TABS tablet TAKE 1 TABLET(15 MG) BY MOUTH DAILY WITH SUPPER 90 tablet 3    Allergies as of 07/01/2023 - Review Complete 07/01/2023  Allergen  Reaction Noted   Compazine [prochlorperazine] Other (See Comments) 10/21/2012   Fenofibrate Rash 10/24/2012   Floxin [ofloxacin] Other (See Comments) 10/21/2012   Statins Other (See Comments) 06/19/2023    Social History   Socioeconomic History   Marital status: Widowed    Spouse name: Not on file   Number of children: Not on file   Years of education: Not on file   Highest education level: Not on file  Occupational History   Not on file  Tobacco Use   Smoking status: Never   Smokeless tobacco: Never   Tobacco comments:    Never smoked 08/24/22  Vaping Use   Vaping status: Never Used  Substance and Sexual Activity   Alcohol use: No   Drug use: Never   Sexual activity: Not Currently    Birth control/protection: Post-menopausal  Other Topics Concern   Not on file  Social History Narrative   Not on file   Social Drivers of Health   Financial Resource Strain: Not on file  Food Insecurity: No Food Insecurity (06/20/2023)   Hunger Vital Sign    Worried About Running Out of Food in the Last Year: Never true    Ran Out of Food in the Last Year: Never true  Transportation Needs: No Transportation Needs (06/20/2023)   PRAPARE - Administrator, Civil Service (Medical): No    Lack of Transportation (Non-Medical): No  Physical Activity: Not on file  Stress: Not on file  Social Connections: Moderately Isolated (06/20/2023)   Social Connection and Isolation Panel    Frequency of Communication with Friends and Family: Three times a week    Frequency of Social Gatherings with Friends and Family: Not on file    Attends Religious Services: More than 4 times per year    Active Member of Clubs or Organizations: No    Attends Banker Meetings: Never    Marital Status: Widowed  Intimate Partner Violence: Not At Risk (06/20/2023)   Humiliation, Afraid, Rape, and Kick questionnaire    Fear of Current or Ex-Partner: No    Emotionally Abused: No    Physically  Abused: No    Sexually Abused: No     Code Status   Code Status: Limited: Do not attempt resuscitation (DNR) -DNR-LIMITED -Do Not Intubate/DNI   Review of Systems: All systems reviewed and negative except where noted in HPI.  Physical  Exam: Vital signs in last 24 hours: Temp:  [97.6 F (36.4 C)-98.6 F (37 C)] 98.6 F (37 C) (06/28 0631) Pulse Rate:  [44-51] 50 (06/28 0600) Resp:  [17-21] 20 (06/28 0600) BP: (104-120)/(55-88) 105/67 (06/28 0600) SpO2:  [98 %-100 %] 98 % (06/28 0600)    General:  Pleasant female in NAD Psych:  Cooperative. Normal mood and affect Eyes: Pupils equal Ears:  Normal auditory acuity Nose: No deformity, discharge or lesions Neck:  Supple, no masses felt Lungs: O2 per nasal cannula.  Slightly labored breathing with conversation . Decreased breath sounds at both bases  Heart: Bradycardia Abdomen:  Soft, nondistended, nontender, active bowel sounds, no masses felt Rectal :  Deferred Msk: Symmetrical without gross deformities.  Neurologic:  Alert, oriented, grossly normal neurologically Extremities : No edema Skin:  Intact without significant lesions.    Intake/Output from previous day: No intake/output data recorded. Intake/Output this shift:  Total I/O In: -  Out: 275 [Urine:275]   Vina Dasen, NP-C   07/02/2023, 9:06 AM

## 2023-07-02 NOTE — ED Notes (Signed)
 Cards in to see patient.  Will medicate with lokelma and mexitil  once they arrive from pharmacy.

## 2023-07-02 NOTE — Assessment & Plan Note (Addendum)
 Echocardiogram with reduced LV systolic function EF 40 to 45%, RV systolic function preserved, LA with moderate dilatation, sp TAVR. Hypokinetic entire septum, apical anterior segment and apex.   Continue diuresis with furosemide  to keep negative fluid balance.  Continue spironolactone .  Limited medical therapy due to risk of hypotension   History of ventricular tachycardia and ventricular fibrillation, continue with amiodarone 

## 2023-07-03 ENCOUNTER — Inpatient Hospital Stay (HOSPITAL_COMMUNITY)

## 2023-07-03 DIAGNOSIS — I5021 Acute systolic (congestive) heart failure: Secondary | ICD-10-CM

## 2023-07-03 DIAGNOSIS — K922 Gastrointestinal hemorrhage, unspecified: Secondary | ICD-10-CM

## 2023-07-03 LAB — BASIC METABOLIC PANEL WITH GFR
Anion gap: 12 (ref 5–15)
BUN: 35 mg/dL — ABNORMAL HIGH (ref 8–23)
CO2: 23 mmol/L (ref 22–32)
Calcium: 8.8 mg/dL — ABNORMAL LOW (ref 8.9–10.3)
Chloride: 97 mmol/L — ABNORMAL LOW (ref 98–111)
Creatinine, Ser: 1.67 mg/dL — ABNORMAL HIGH (ref 0.44–1.00)
GFR, Estimated: 29 mL/min — ABNORMAL LOW (ref >60)
Glucose, Bld: 92 mg/dL (ref 70–99)
Potassium: 4.1 mmol/L (ref 3.5–5.1)
Sodium: 132 mmol/L — ABNORMAL LOW (ref 135–145)

## 2023-07-03 LAB — ECHOCARDIOGRAM LIMITED
AR max vel: 2.5 cm2
AV Area VTI: 2.46 cm2
AV Area mean vel: 2.41 cm2
AV Mean grad: 8 mmHg
AV Peak grad: 15.7 mmHg
Ao pk vel: 1.98 m/s
Area-P 1/2: 3.42 cm2
MV M vel: 4.34 m/s
MV Peak grad: 75.3 mmHg
Radius: 0.4 cm
S' Lateral: 3.5 cm
Single Plane A4C EF: 35.1 %
Weight: 2645.52 [oz_av]

## 2023-07-03 LAB — CBC
HCT: 30.6 % — ABNORMAL LOW (ref 36.0–46.0)
Hemoglobin: 10 g/dL — ABNORMAL LOW (ref 12.0–15.0)
MCH: 35.5 pg — ABNORMAL HIGH (ref 26.0–34.0)
MCHC: 32.7 g/dL (ref 30.0–36.0)
MCV: 108.5 fL — ABNORMAL HIGH (ref 80.0–100.0)
Platelets: 311 10*3/uL (ref 150–400)
RBC: 2.82 MIL/uL — ABNORMAL LOW (ref 3.87–5.11)
RDW: 16.2 % — ABNORMAL HIGH (ref 11.5–15.5)
WBC: 6.9 10*3/uL (ref 4.0–10.5)
nRBC: 0.6 % — ABNORMAL HIGH (ref 0.0–0.2)

## 2023-07-03 LAB — FOLATE: Folate: 10.2 ng/mL (ref >5.9)

## 2023-07-03 LAB — MAGNESIUM: Magnesium: 2.2 mg/dL (ref 1.7–2.4)

## 2023-07-03 MED ORDER — HYDROCORTISONE (PERIANAL) 2.5 % EX CREA
TOPICAL_CREAM | Freq: Three times a day (TID) | CUTANEOUS | Status: DC
  Filled 2023-07-03: qty 28.35

## 2023-07-03 MED ORDER — FUROSEMIDE 40 MG PO TABS
40.0000 mg | ORAL_TABLET | Freq: Every day | ORAL | Status: DC
  Administered 2023-07-03 – 2023-07-04 (×2): 40 mg via ORAL
  Filled 2023-07-03 (×2): qty 1

## 2023-07-03 MED ORDER — POLYETHYLENE GLYCOL 3350 17 G PO PACK
17.0000 g | PACK | Freq: Two times a day (BID) | ORAL | Status: DC
  Administered 2023-07-03: 17 g via ORAL
  Filled 2023-07-03 (×3): qty 1

## 2023-07-03 MED ORDER — DOCUSATE SODIUM 100 MG PO CAPS
200.0000 mg | ORAL_CAPSULE | Freq: Two times a day (BID) | ORAL | Status: DC
  Administered 2023-07-03: 200 mg via ORAL
  Filled 2023-07-03 (×3): qty 2

## 2023-07-03 NOTE — Evaluation (Signed)
 Physical Therapy Evaluation Patient Details Name: [name] MRN: [id number] DOB: Oct 12, 1931 Today's Date: 07/03/2023  History of Present Illness  Pt is 89 yo presenting to [facility] ED on 6/27 due to coffee ground emesis with abdominal pain and nausea. Recent hospitalization 6/15 and 6/24 with emergent heart cath status post LAD DES stent and cardiogenic shock. PMH: hyperlipidemia, SVT, PAF, severe AS s/p advert SAPIEN 3 bioprosthetic TAVR 2018, mild nonobstructive CAD.  Clinical Impression  Pt is presenting below previous baseline level of functioning and slightly above her previous hospitalization level of functioning. Pt was in the process of rehabilitating when she returned from above issues. Pt is currently Min A for bed mobility, Max A for sit to stand and stand pivot transfers. Pt has supportive daughter. Due to pt current functional status, home set up and available assistance at home recommending skilled physical therapy services < 3 hours/day in order to address strength, balance and functional mobility to decrease risk for falls, injury, immobility, skin break down and re-hospitalization.          If plan is discharge home, recommend the following: A lot of help with walking and/or transfers;Assist for transportation;Assistance with cooking/housework;Help with stairs or ramp for entrance   Can travel by private vehicle   No    Equipment Recommendations Wheelchair cushion (measurements PT);Wheelchair (measurements PT);Hoyer lift;Hospital bed     Functional Status Assessment Patient has had a recent decline in their functional status and demonstrates the ability to make significant improvements in function in a reasonable and predictable amount of time.     Precautions / Restrictions Precautions Precautions: Fall Recall of Precautions/Restrictions: Intact Precaution/Restrictions Comments: watch HR, O2 Restrictions Weight Bearing Restrictions Per Provider Order: No       Mobility  Bed Mobility Overal bed mobility: Needs Assistance Bed Mobility: Supine to Sit     Supine to sit: Min assist     General bed mobility comments: cues for bedrail use, light assist for LE to EOB and CGA at trunk to mid line    Transfers Overall transfer level: Needs assistance Equipment used: None Transfers: Sit to/from Stand, Bed to chair/wheelchair/BSC Sit to Stand: Max assist Stand pivot transfers: Max assist         General transfer comment: Max A for stand pivot transfer from EOB to recliner with heavy posterior lean    Ambulation/Gait     General Gait Details: unable at this time.     Balance Overall balance assessment: Needs assistance Sitting-balance support: Bilateral upper extremity supported, Feet supported Sitting balance-Leahy Scale: Fair   Postural control: Posterior lean Standing balance support: Bilateral upper extremity supported Standing balance-Leahy Scale: Zero Standing balance comment: Max A to maintain balance with heavy posterior lean       Pertinent Vitals/Pain Pain Assessment Pain Assessment: 0-10 Pain Score: 9  Pain Location: chest from CPR, only when she coughs Pain Descriptors / Indicators: Sore Pain Intervention(s): Monitored during session, Limited activity within patient's tolerance    Home Living Family/patient expects to be discharged to:: Skilled nursing facility Living Arrangements: Alone Available Help at Discharge: Family;Personal care attendant;Available 24 hours/day Type of Home: House Home Access: Stairs to enter   [employer] of Steps: from  garage level entry   Home Layout: One level Home Equipment: Agricultural consultant (2 wheels);Rollator (4 wheels);Grab bars - toilet;Grab bars - tub/shower;Shower seat - built in;Hand held shower head;Lift chair      Prior Function Prior Level of Function : Independent/Modified Independent;Driving  Mobility Comments: uses a rollator around the  house and out to the mailbox; uses a cane out in the community; friends help her stand up becuase of a bad Left knee ADLs Comments: Independent with all IADL tasks with the exception of house cleaning - hires someone to clean her house; uses a lift chair     Extremity/Trunk Assessment   Upper Extremity Assessment Upper Extremity Assessment: Generalized weakness;Defer to OT evaluation    Lower Extremity Assessment Lower Extremity Assessment: Generalized weakness    Cervical / Trunk Assessment Cervical / Trunk Assessment: Kyphotic  Communication   Communication Communication: Impaired Factors Affecting Communication: Hearing impaired    Cognition Arousal: Alert Behavior During Therapy: WFL for tasks assessed/performed   PT - Cognitive impairments: No apparent impairments       Following commands: Intact       Cueing Cueing Techniques: Verbal cues     General Comments General comments (skin integrity, edema, etc.): Daughter at bedside.        Assessment/Plan    PT Assessment Patient needs continued PT services  PT Problem List Decreased strength;Decreased activity tolerance;Decreased balance;Decreased mobility;Cardiopulmonary status limiting activity;Decreased safety awareness       PT Treatment Interventions DME instruction;Gait training;Functional mobility training;Therapeutic activities;Therapeutic exercise;Balance training;Patient/family education    PT Goals (Current goals can be found in the Care Plan section)  Acute Rehab PT Goals Patient Stated Goal: to return to rehab and get stronger PT Goal Formulation: With patient Time For Goal Achievement: 07/17/23 Potential to Achieve Goals: Fair    Frequency Min 1X/week        AM-PAC PT 6 Clicks Mobility  Outcome Measure Help needed turning from your back to your side while in a flat bed without using bedrails?: A Little Help needed moving from lying on your back to sitting on the side of a flat bed  without using bedrails?: A Little Help needed moving to and from a bed to a chair (including a wheelchair)?: A Lot Help needed standing up from a chair using your arms (e.g., wheelchair or bedside chair)?: A Lot Help needed to walk in hospital room?: Total Help needed climbing 3-5 steps with a railing? : Total 6 Click Score: 12    End of Session Equipment Utilized During Treatment: Gait belt Activity Tolerance: Patient limited by fatigue;Patient tolerated treatment well Patient left: in chair;with call bell/phone within reach;with chair alarm set;with family/visitor present Nurse Communication: Mobility status PT Visit Diagnosis: Muscle weakness (generalized) (M62.81);Other abnormalities of gait and mobility (R26.89)    Time: 8494-8471 PT Time Calculation (min) (ACUTE ONLY): 23 min   Charges:   PT Evaluation $PT Eval Low Complexity: 1 Low PT Treatments $Therapeutic Activity: 8-22 mins PT General Charges $$ ACUTE PT VISIT: 1 Visit       Dorothyann Maier, DPT, CLT  Acute Rehabilitation Services Office: [phone] (Secure chat preferred)   Dorothyann VEAR Maier 07/03/2023, 3:34 PM

## 2023-07-03 NOTE — TOC Initial Note (Signed)
 Transition of Care [facility]) - Initial/Assessment Note    Patient Details  Name: [name] MRN: [id number] Date of Birth: 11/28/1931  [facility]) CM/SW Contact:    Marval Gell, RN Phone Number: 07/03/2023, 10:12 AM  Clinical Narrative:                  Patient admitted from [facility] for GIB.  Was recently discharged 6/24 from [facility].  TOC will continue to follow   Expected Discharge Plan: Skilled Nursing Facility Barriers to Discharge: Continued Medical Work up   Patient Goals and CMS Choice            Expected Discharge Plan and Services In-house Referral: Clinical Social Work Discharge Planning Services: CM Consult   Living arrangements for the past 2 months: Single Family Home                                      Prior Living Arrangements/Services Living arrangements for the past 2 months: Single Family Home Lives with:: Self              Current home services: DME    Activities of Daily Living      Permission Sought/Granted                  Emotional Assessment              Admission diagnosis:  GI bleeding [K92.2] Acute GI bleeding [K92.2] Patient Active Problem List   Diagnosis Date Noted   Paroxysmal atrial fibrillation (HCC) 07/02/2023   Coffee ground emesis 07/02/2023   Acute upper GI bleeding 07/01/2023   History of ventricular tachycardia 07/01/2023   Acute on chronic systolic CHF (congestive heart failure) (HCC) 07/01/2023   Chronic kidney disease, stage 3b (HCC) 07/01/2023   GI bleeding 07/01/2023   Hyperkalemia 07/01/2023   Sinus bradycardia 07/01/2023   Acute ST elevation myocardial infarction (STEMI) due to occlusion of left anterior descending (LAD) coronary artery (HCC) 06/19/2023   Antiplatelet or antithrombotic long-term use 10/06/2022   Persistent atrial fibrillation (HCC) 09/08/2022   PAF (paroxysmal atrial fibrillation) (HCC) 08/24/2022   Hypercoagulable state due to  persistent atrial fibrillation (HCC) 08/24/2022   Atrial fibrillation with rapid ventricular response (HCC) 12/30/2021   Atrial fibrillation with RVR (HCC) 12/30/2021   Chronic kidney disease, stage 3 unspecified (HCC) 07/22/2021   Drug-induced myopathy 07/22/2021   History of transcatheter aortic valve replacement (TAVR) 07/22/2021   History of peptic ulcer 07/22/2021   Osteoporosis 07/22/2021   Personal history of malignant neoplasm of breast 07/22/2021   Rheumatic aortic stenosis with regurgitation 07/22/2021   Senile osteopenia 07/22/2021   Vitamin D  deficiency 07/22/2021   History of total knee replacement, right 11/04/2019   Pain in joint of left knee 11/04/2019   Osteoarthritis of left knee 11/04/2019   Carotid artery disease (HCC)    ETD (Eustachian tube dysfunction), bilateral 05/10/2017   Seasonal allergic rhinitis 05/10/2017   Aortic stenosis, severe    Rheumatic fever    CAD (coronary artery disease), native coronary artery 10/20/2015   Weakness 05/08/2015   Breast cancer, right breast (HCC)    PUD (peptic ulcer disease)    Hypothyroidism    Hypercholesteremia    Obese 08/29/2013   S/P right THA, AA 08/28/2013   Chronic diastolic heart failure (HCC) 02/13/2013   SVT (supraventricular tachycardia) (HCC) 10/24/2012   PCP:  [name], MD Pharmacy:   [phone] [place], [place] - [address] [place] [address] Phone: [phone] Fax: [phone]  [facility] [address] [place] [address] Phone: [phone] Fax: [phone]     Social Drivers of Health (SDOH) Social History: SDOH Screenings   Food Insecurity: No Food Insecurity (07/02/2023)  Housing: Low Risk  (07/02/2023)  Transportation Needs: No Transportation Needs (07/02/2023)  Utilities: Not At Risk (07/02/2023)  Social Connections: Moderately Isolated (07/02/2023)  Tobacco Use: Low Risk  (07/01/2023)   SDOH  Interventions:     Readmission Risk Interventions     No data to display

## 2023-07-03 NOTE — Progress Notes (Signed)
 PROGRESS NOTE                                                                                                                                                                                                             Patient Demographics:    [name], is a 89 y.o. female, DOB - [birth year], FMW:[id number]  Outpatient Primary MD for the patient is [name] MATSU, MD    LOS - 2  Admit date - 07/01/2023    Chief Complaint  Patient presents with   GI Bleeding       Brief Narrative (HPI from H&P)   89 y.o. female with medical history significant of recent STEMI 06/28/2023 underwent emergent heart cath status post LAD DES stent, HFrEF  40% to 45%, cardiogenic shock required norepinephrine  and milrinone , ventricular tachycardia fibrillation treated with IV lidocaine  and amiodarone -transition to amiodarone  and mexiletine, postcardiac arrest required CPR, limited GDMT due to low EF and blood pressure, on Plavix  and Xarelto  due to management of CAD and ventricular tachycardia, history of bioprosthetic TAVR 2018,  pulmonary hypertension, and CKD stage IIIb presented emergency department complaining of coffee-ground emesis.   Daughter at the bedside reported that nursing home called and informed her patient has 1 episode of coffee-ground emesis.  There is no episodes of melena or melena.  In the ED FOBT positive and physical exam did not showed any blood in the rectal vault.  History of internal hemorrhoid.   Subjective:    [name] today has, No headache, No chest pain, No abdominal pain - No Nausea, No new weakness tingling or numbness, no SOB, constipation, no further emesis, no BM still  .     Assessment  & Plan :    Acute upper GI bleeding with mild acute upper GI blood loss related anemia Seen by GI, recently had left heart catheterization and stent placement hence was on DAPT plus anticoagulation for underlying  A-fib, currently only on Plavix , seen by GI.  Thankfully no further episodes of hematemesis or melena, she is slightly constipated but otherwise no nausea vomiting or abdominal pain.  Continue to monitor CBC.  Agreeable for transfusion if needed.  Type screen done.  Acute on chronic systolic CHF (congestive heart failure) (HCC) Echocardiogram with reduced LV systolic function EF 40 to 45%, RV systolic function preserved, LA with moderate  dilatation, sp TAVR. Hypokinetic entire septum, apical anterior segment and apex.  Cardiology on board managing diuretics.  CAD (coronary artery disease), native coronary artery Recent STMI and drug eluding stent placement, currently on Plavix  only.  Aspirin  on hold.  Continue statin.  Cardiology on board.  No acute issue.   Paroxysmal atrial fibrillation (HCC) and history of V. tach in the past Currently on combination of amiodarone , mexiletine, DOAC on hold continue Plavix .  Chronic kidney disease, stage 3b (HCC) with baseline creatinine between 1.6 and 1.9.  Mild hyponatremia. In mild volume overload, diuretics per cardiology.  Monitor.  Hypothyroidism Continue levothyroxine          Condition - Extremely Guarded  Family Communication  :  None  Code Status :  DNR  Consults  :  GI, Cards  PUD Prophylaxis :  PPI   Procedures  :            Disposition Plan  :    Status is: Inpatient   DVT Prophylaxis  :    SCDs Start: 07/01/23 2341 Place TED hose Start: 07/01/23 2341    Lab Results  Component Value Date   PLT 311 07/03/2023    Diet :  Diet Order             Diet clear liquid Room service appropriate? Yes; Fluid consistency: Thin; Fluid restriction: 2000 mL Fluid  Diet effective now                    Inpatient Medications  Scheduled Meds:  sodium chloride    Intravenous Once   amiodarone   200 mg Oral Daily   ascorbic acid   500 mg Oral Daily   clopidogrel   75 mg Oral Daily   docusate sodium   200 mg Oral BID    furosemide   20 mg Intravenous BID   hydrocortisone    Rectal TID   levothyroxine   112 mcg Oral QAC breakfast   mexiletine  150 mg Oral Q8H   pantoprazole  (PROTONIX ) IV  40 mg Intravenous Q12H   polyethylene glycol  17 g Oral BID   sodium chloride  flush  3 mL Intravenous Q12H   sodium chloride  flush  3 mL Intravenous Q12H   spironolactone   25 mg Oral Daily   Continuous Infusions: PRN Meds:.acetaminophen  **OR** acetaminophen , ondansetron  **OR** ondansetron  (ZOFRAN ) IV, sodium chloride  flush  Antibiotics  :    Anti-infectives (From admission, onward)    None         Objective:   Vitals:   07/02/23 1941 07/03/23 0400 07/03/23 0500 07/03/23 0800  BP: (!) 111/59 (!) 102/51  (!) 108/58  Pulse: (!) 56 (!) 52 (!) 49 (!) 54  Resp: 18 16 16 18   Temp: 98.1 F (36.7 C) 98 F (36.7 C)  97.7 F (36.5 C)  TempSrc: Oral Oral  Oral  SpO2: 97% 98% 96% 95%  Weight:   75 kg     Wt Readings from Last 3 Encounters:  07/03/23 75 kg  06/28/23 68.1 kg  05/26/23 72.8 kg     Intake/Output Summary (Last 24 hours) at 07/03/2023 0854 Last data filed at 07/03/2023 0827 Gross per 24 hour  Intake 246 ml  Output 1700 ml  Net -1454 ml     Physical Exam  Awake Alert, No new F.N deficits, Normal affect [place].AT,PERRAL Supple Neck, No JVD,   Symmetrical Chest wall movement, Good air movement bilaterally, CTAB RRR,No Gallops,Rubs or new Murmurs,  +ve B.Sounds, Abd Soft, No tenderness,   No  Cyanosis, Clubbing or edema       Data Review:    Recent Labs  Lab 06/27/23 0234 06/28/23 0257 07/01/23 2138 07/01/23 2354 07/02/23 0600 07/03/23 0753  WBC 6.3 6.8 9.2  --  7.7 6.9  HGB 9.5* 9.3* 10.0* 9.7* 9.8* 10.0*  HCT 28.7* 28.4* 31.0* 30.3* 30.2* 30.6*  PLT 255 292 338  --  328 311  MCV 104.0* 104.4* 108.8*  --  107.9* 108.5*  MCH 34.4* 34.2* 35.1*  --  35.0* 35.5*  MCHC 33.1 32.7 32.3  --  32.5 32.7  RDW 13.8 14.1 15.8*  --  15.8* 16.2*  LYMPHSABS  --   --  0.9  --   --   --   MONOABS   --   --  0.9  --   --   --   EOSABS  --   --  0.2  --   --   --   BASOSABS  --   --  0.0  --   --   --     Recent Labs  Lab 06/27/23 0234 06/28/23 0257 07/01/23 2138 07/02/23 0600  NA 132* 133* 129* 131*  K 3.8 4.1 5.3* 4.7  CL 93* 98 98 98  CO2 27 23 20* 25  ANIONGAP 12 12 11 8   GLUCOSE 101* 102* 127* 94  BUN 36* 37* 41* 40*  CREATININE 1.63* 1.71* 1.97* 1.91*  AST  --   --  36 47*  ALT  --   --  24 34  ALKPHOS  --   --  84 87  BILITOT  --   --  1.1 1.5*  ALBUMIN   --   --  2.9* 2.7*  INR  --   --   --  3.6*  BNP  --   --  4,445.8*  --   MG 2.0 1.9  --   --   CALCIUM 9.2 8.7* 8.6* 8.8*      Recent Labs  Lab 06/27/23 0234 06/28/23 0257 07/01/23 2138 07/02/23 0600  INR  --   --   --  3.6*  BNP  --   --  4,445.8*  --   MG 2.0 1.9  --   --   CALCIUM 9.2 8.7* 8.6* 8.8*    --------------------------------------------------------------------------------------------------------------- Lab Results  Component Value Date   CHOL 171 06/20/2023   HDL 52 06/20/2023   LDLCALC 108 (H) 06/20/2023   TRIG 54 06/20/2023   CHOLHDL 3.3 06/20/2023    Lab Results  Component Value Date   HGBA1C 5.1 06/19/2023   No results for input(s): TSH, T4TOTAL, FREET4, T3FREE, THYROIDAB in the last 72 hours. Recent Labs    07/02/23 1006  VITAMINB12 228  FOLATE 11.3  FERRITIN 241  TIBC 291  IRON 67  RETICCTPCT 6.4*   ------------------------------------------------------------------------------------------------------------------ Cardiac Enzymes No results for input(s): CKMB, TROPONINI, MYOGLOBIN in the last 168 hours.  Invalid input(s): CK  Micro Results No results found for this or any previous visit (from the past 240 hours).  Radiology Report DG Chest Port 1 View Result Date: 07/03/2023 CLINICAL DATA:  Shortness of breath EXAM: PORTABLE CHEST 1 VIEW COMPARISON:  07/01/2023 FINDINGS: Stable cardiomediastinal contours. Aortic atherosclerotic  calcifications. Status post TAVR. Decreased lung volumes. Increased pulmonary vascular congestion. Blunting of the left costophrenic angle compatible with small effusion. No airspace consolidation or pneumothorax. Visualized osseous structures appear grossly intact. IMPRESSION: Increased pulmonary vascular congestion and small left pleural effusion. Electronically Signed   By: Waddell  Joan M.D.   On: 07/03/2023 07:47   DG Chest Portable 1 View Result Date: 07/01/2023 CLINICAL DATA:  dyspnea EXAM: PORTABLE CHEST - 1 VIEW COMPARISON:  June 21, 2023 FINDINGS: Central pulmonary vascular congestion. No focal airspace consolidation, pleural effusion, or pneumothorax. Mild cardiomegaly. Endovascular aortic valve replacement. Aortic atherosclerosis.No acute fracture or destructive lesion. IMPRESSION: Mild cardiomegaly with central pulmonary vascular congestion, unchanged. Electronically Signed   By: Rogelia Myers M.D.   On: 07/01/2023 21:48     Signature  -   Lavada Stank M.D on 07/03/2023 at 8:54 AM   -  To page go to [email or web address]

## 2023-07-03 NOTE — Plan of Care (Signed)
   Problem: Education: Goal: Knowledge of General Education information will improve Description: Including pain rating scale, medication(s)/side effects and non-pharmacologic comfort measures Outcome: Progressing   Problem: Clinical Measurements: Goal: Ability to maintain clinical measurements within normal limits will improve Outcome: Progressing Goal: Will remain free from infection Outcome: Progressing   Problem: Activity: Goal: Risk for activity intolerance will decrease Outcome: Progressing   Problem: Nutrition: Goal: Adequate nutrition will be maintained Outcome: Progressing   Problem: Pain Managment: Goal: General experience of comfort will improve and/or be controlled Outcome: Progressing   Problem: Safety: Goal: Ability to remain free from injury will improve Outcome: Progressing

## 2023-07-03 NOTE — Progress Notes (Signed)
  Progress Note  Patient Name: [name] Date of Encounter: 07/03/2023 [facility] Cardiologist: Lonni Cash, MD   Interval Summary   No further N/V. Hgb stable. Right side ribs still sore after CPR earlier this month. Sinus brady, no AFib or VT. Still on O2. Net diuresis 1.7 liters.   Vital Signs Vitals:   07/02/23 1941 07/03/23 0400 07/03/23 0500 07/03/23 0800  BP: (!) 111/59 (!) 102/51  (!) 108/58  Pulse: (!) 56 (!) 52 (!) 49 (!) 54  Resp: 18 16 16 18   Temp: 98.1 F (36.7 C) 98 F (36.7 C)  97.7 F (36.5 C)  TempSrc: Oral Oral  Oral  SpO2: 97% 98% 96% 95%  Weight:   75 kg     Intake/Output Summary (Last 24 hours) at 07/03/2023 0912 Last data filed at 07/03/2023 0827 Gross per 24 hour  Intake 246 ml  Output 1700 ml  Net -1454 ml      07/03/2023    5:00 AM 06/28/2023    6:00 AM 06/27/2023    3:51 AM  Last 3 Weights  Weight (lbs) 165 lb 5.5 oz 150 lb 2.1 oz 160 lb 4.4 oz  Weight (kg) 75 kg 68.1 kg 72.7 kg      Telemetry/ECG  Sinus bradycardia - Personally Reviewed  Physical Exam  GEN: No acute distress.   Neck: No JVD Cardiac: RRR, no murmurs, rubs, or gallops.  Respiratory: Clear to auscultation bilaterally. GI: Soft, nontender, non-distended  MS: No edema  Assessment & Plan  No arrhythmia.  Clinically appears to be close to euvolemic status at 75 kg.  If weights are accurate she is 7 kg heavier compared to 06/28/2023 discharge summary, but the DC weight of 68 kg was probably erroneous (the previous 3 days during that admission her weights were 73-74 kg and at her 5/22 office appt she weighed 73 kg). Change to PO diuretics after this AM dose of IV furosemide . Today's labs are still pending. Assuming no major electrolyte imbalances or worsening renal parameters, can return to previous diuretic regimen. No evidence of bleeding (stable macrocytic hyporegenerative anemia, normal iron stores and folate and B12).  [facility] will  sign off.   The patient is ready for discharge tomorrow from a cardiac standpoint. Medication Recommendations:   Amiodarone  200 mg daily (new dose) Mexiletine 150 mg three times daily Clopidogrel  75 mg daily Furosemide  40 mg daily Spironolactone  25 mg daily ASA and Xarelto  stopped Other recommendations (labs, testing, etc):  Recheck CBC and BMET in a week Follow up as an outpatient:  Will need to reschedule. She had an appt tomorrow in the HF clinic.  For questions or updates, please contact [facility] Please consult [email or web address] for contact info under       Signed, Jerel Balding, MD

## 2023-07-03 NOTE — Progress Notes (Signed)
  Echocardiogram 2D Echocardiogram has been performed.  [name] 07/03/2023, 9:48 AM

## 2023-07-04 ENCOUNTER — Encounter (HOSPITAL_COMMUNITY)

## 2023-07-04 DIAGNOSIS — N182 Chronic kidney disease, stage 2 (mild): Secondary | ICD-10-CM | POA: Diagnosis not present

## 2023-07-04 DIAGNOSIS — R002 Palpitations: Secondary | ICD-10-CM | POA: Diagnosis not present

## 2023-07-04 DIAGNOSIS — Z7984 Long term (current) use of oral hypoglycemic drugs: Secondary | ICD-10-CM | POA: Diagnosis not present

## 2023-07-04 DIAGNOSIS — Z7901 Long term (current) use of anticoagulants: Secondary | ICD-10-CM | POA: Diagnosis not present

## 2023-07-04 DIAGNOSIS — E559 Vitamin D deficiency, unspecified: Secondary | ICD-10-CM | POA: Diagnosis not present

## 2023-07-04 DIAGNOSIS — I2102 ST elevation (STEMI) myocardial infarction involving left anterior descending coronary artery: Secondary | ICD-10-CM | POA: Diagnosis not present

## 2023-07-04 DIAGNOSIS — I340 Nonrheumatic mitral (valve) insufficiency: Secondary | ICD-10-CM | POA: Diagnosis not present

## 2023-07-04 DIAGNOSIS — I4901 Ventricular fibrillation: Secondary | ICD-10-CM | POA: Diagnosis not present

## 2023-07-04 DIAGNOSIS — Z7401 Bed confinement status: Secondary | ICD-10-CM | POA: Diagnosis not present

## 2023-07-04 DIAGNOSIS — I5023 Acute on chronic systolic (congestive) heart failure: Secondary | ICD-10-CM | POA: Diagnosis not present

## 2023-07-04 DIAGNOSIS — R41841 Cognitive communication deficit: Secondary | ICD-10-CM | POA: Diagnosis not present

## 2023-07-04 DIAGNOSIS — Z7902 Long term (current) use of antithrombotics/antiplatelets: Secondary | ICD-10-CM | POA: Diagnosis not present

## 2023-07-04 DIAGNOSIS — K219 Gastro-esophageal reflux disease without esophagitis: Secondary | ICD-10-CM | POA: Diagnosis not present

## 2023-07-04 DIAGNOSIS — I4821 Permanent atrial fibrillation: Secondary | ICD-10-CM | POA: Diagnosis not present

## 2023-07-04 DIAGNOSIS — I5022 Chronic systolic (congestive) heart failure: Secondary | ICD-10-CM | POA: Diagnosis not present

## 2023-07-04 DIAGNOSIS — Z9011 Acquired absence of right breast and nipple: Secondary | ICD-10-CM | POA: Diagnosis not present

## 2023-07-04 DIAGNOSIS — Z96643 Presence of artificial hip joint, bilateral: Secondary | ICD-10-CM | POA: Diagnosis not present

## 2023-07-04 DIAGNOSIS — R0602 Shortness of breath: Secondary | ICD-10-CM | POA: Diagnosis not present

## 2023-07-04 DIAGNOSIS — M6281 Muscle weakness (generalized): Secondary | ICD-10-CM | POA: Diagnosis not present

## 2023-07-04 DIAGNOSIS — G720 Drug-induced myopathy: Secondary | ICD-10-CM | POA: Diagnosis not present

## 2023-07-04 DIAGNOSIS — K5900 Constipation, unspecified: Secondary | ICD-10-CM | POA: Diagnosis not present

## 2023-07-04 DIAGNOSIS — N183 Chronic kidney disease, stage 3 unspecified: Secondary | ICD-10-CM | POA: Diagnosis not present

## 2023-07-04 DIAGNOSIS — K920 Hematemesis: Secondary | ICD-10-CM | POA: Diagnosis not present

## 2023-07-04 DIAGNOSIS — E039 Hypothyroidism, unspecified: Secondary | ICD-10-CM | POA: Diagnosis not present

## 2023-07-04 DIAGNOSIS — Z6827 Body mass index (BMI) 27.0-27.9, adult: Secondary | ICD-10-CM | POA: Diagnosis not present

## 2023-07-04 DIAGNOSIS — M1712 Unilateral primary osteoarthritis, left knee: Secondary | ICD-10-CM | POA: Diagnosis not present

## 2023-07-04 DIAGNOSIS — I499 Cardiac arrhythmia, unspecified: Secondary | ICD-10-CM | POA: Diagnosis not present

## 2023-07-04 DIAGNOSIS — I480 Paroxysmal atrial fibrillation: Secondary | ICD-10-CM | POA: Diagnosis not present

## 2023-07-04 DIAGNOSIS — I472 Ventricular tachycardia, unspecified: Secondary | ICD-10-CM | POA: Diagnosis not present

## 2023-07-04 DIAGNOSIS — R0902 Hypoxemia: Secondary | ICD-10-CM | POA: Diagnosis not present

## 2023-07-04 DIAGNOSIS — Z952 Presence of prosthetic heart valve: Secondary | ICD-10-CM | POA: Diagnosis not present

## 2023-07-04 DIAGNOSIS — I420 Dilated cardiomyopathy: Secondary | ICD-10-CM | POA: Diagnosis not present

## 2023-07-04 DIAGNOSIS — I272 Pulmonary hypertension, unspecified: Secondary | ICD-10-CM | POA: Diagnosis not present

## 2023-07-04 DIAGNOSIS — I088 Other rheumatic multiple valve diseases: Secondary | ICD-10-CM | POA: Diagnosis not present

## 2023-07-04 DIAGNOSIS — I10 Essential (primary) hypertension: Secondary | ICD-10-CM | POA: Diagnosis not present

## 2023-07-04 DIAGNOSIS — Z96651 Presence of right artificial knee joint: Secondary | ICD-10-CM | POA: Diagnosis not present

## 2023-07-04 DIAGNOSIS — Z8679 Personal history of other diseases of the circulatory system: Secondary | ICD-10-CM | POA: Diagnosis not present

## 2023-07-04 DIAGNOSIS — N1832 Chronic kidney disease, stage 3b: Secondary | ICD-10-CM | POA: Diagnosis not present

## 2023-07-04 DIAGNOSIS — Z955 Presence of coronary angioplasty implant and graft: Secondary | ICD-10-CM | POA: Diagnosis not present

## 2023-07-04 DIAGNOSIS — E890 Postprocedural hypothyroidism: Secondary | ICD-10-CM | POA: Diagnosis not present

## 2023-07-04 DIAGNOSIS — E663 Overweight: Secondary | ICD-10-CM | POA: Diagnosis not present

## 2023-07-04 DIAGNOSIS — K922 Gastrointestinal hemorrhage, unspecified: Secondary | ICD-10-CM | POA: Diagnosis not present

## 2023-07-04 DIAGNOSIS — K648 Other hemorrhoids: Secondary | ICD-10-CM | POA: Diagnosis not present

## 2023-07-04 DIAGNOSIS — D631 Anemia in chronic kidney disease: Secondary | ICD-10-CM | POA: Diagnosis not present

## 2023-07-04 DIAGNOSIS — I252 Old myocardial infarction: Secondary | ICD-10-CM | POA: Diagnosis not present

## 2023-07-04 DIAGNOSIS — E785 Hyperlipidemia, unspecified: Secondary | ICD-10-CM | POA: Diagnosis not present

## 2023-07-04 DIAGNOSIS — R278 Other lack of coordination: Secondary | ICD-10-CM | POA: Diagnosis not present

## 2023-07-04 DIAGNOSIS — Z953 Presence of xenogenic heart valve: Secondary | ICD-10-CM | POA: Diagnosis not present

## 2023-07-04 DIAGNOSIS — R2681 Unsteadiness on feet: Secondary | ICD-10-CM | POA: Diagnosis not present

## 2023-07-04 DIAGNOSIS — R609 Edema, unspecified: Secondary | ICD-10-CM | POA: Diagnosis not present

## 2023-07-04 DIAGNOSIS — R197 Diarrhea, unspecified: Secondary | ICD-10-CM | POA: Diagnosis not present

## 2023-07-04 DIAGNOSIS — I2510 Atherosclerotic heart disease of native coronary artery without angina pectoris: Secondary | ICD-10-CM | POA: Diagnosis not present

## 2023-07-04 DIAGNOSIS — I471 Supraventricular tachycardia, unspecified: Secondary | ICD-10-CM | POA: Diagnosis not present

## 2023-07-04 LAB — BASIC METABOLIC PANEL WITH GFR
Anion gap: 11 (ref 5–15)
BUN: 29 mg/dL — ABNORMAL HIGH (ref 8–23)
CO2: 23 mmol/L (ref 22–32)
Calcium: 8.5 mg/dL — ABNORMAL LOW (ref 8.9–10.3)
Chloride: 94 mmol/L — ABNORMAL LOW (ref 98–111)
Creatinine, Ser: 1.56 mg/dL — ABNORMAL HIGH (ref 0.44–1.00)
GFR, Estimated: 32 mL/min — ABNORMAL LOW (ref >60)
Glucose, Bld: 83 mg/dL (ref 70–99)
Potassium: 3.8 mmol/L (ref 3.5–5.1)
Sodium: 128 mmol/L — ABNORMAL LOW (ref 135–145)

## 2023-07-04 LAB — CBC WITH DIFFERENTIAL/PLATELET
Abs Immature Granulocytes: 0.05 10*3/uL (ref 0.00–0.07)
Basophils Absolute: 0 10*3/uL (ref 0.0–0.1)
Basophils Relative: 0 %
Eosinophils Absolute: 0.2 10*3/uL (ref 0.0–0.5)
Eosinophils Relative: 4 %
HCT: 29.5 % — ABNORMAL LOW (ref 36.0–46.0)
Hemoglobin: 9.5 g/dL — ABNORMAL LOW (ref 12.0–15.0)
Immature Granulocytes: 1 %
Lymphocytes Relative: 9 %
Lymphs Abs: 0.5 10*3/uL — ABNORMAL LOW (ref 0.7–4.0)
MCH: 34.7 pg — ABNORMAL HIGH (ref 26.0–34.0)
MCHC: 32.2 g/dL (ref 30.0–36.0)
MCV: 107.7 fL — ABNORMAL HIGH (ref 80.0–100.0)
Monocytes Absolute: 0.6 10*3/uL (ref 0.1–1.0)
Monocytes Relative: 10 %
Neutro Abs: 4.1 10*3/uL (ref 1.7–7.7)
Neutrophils Relative %: 76 %
Platelets: 282 10*3/uL (ref 150–400)
RBC: 2.74 MIL/uL — ABNORMAL LOW (ref 3.87–5.11)
RDW: 16.3 % — ABNORMAL HIGH (ref 11.5–15.5)
WBC: 5.5 10*3/uL (ref 4.0–10.5)
nRBC: 0.4 % — ABNORMAL HIGH (ref 0.0–0.2)

## 2023-07-04 LAB — T4, FREE: Free T4: 1.72 ng/dL — ABNORMAL HIGH (ref 0.61–1.12)

## 2023-07-04 LAB — MAGNESIUM: Magnesium: 2 mg/dL (ref 1.7–2.4)

## 2023-07-04 LAB — TSH: TSH: 1.85 u[IU]/mL (ref 0.350–4.500)

## 2023-07-04 MED ORDER — POLYETHYLENE GLYCOL 3350 17 G PO PACK: 17.0000 g | PACK | Freq: Every day | ORAL | Status: AC

## 2023-07-04 MED ORDER — AMIODARONE HCL 200 MG PO TABS: 200.0000 mg | ORAL_TABLET | Freq: Every day | ORAL | Status: DC

## 2023-07-04 MED ORDER — ACETAMINOPHEN 500 MG PO TABS: 500.0000 mg | ORAL_TABLET | Freq: Three times a day (TID) | ORAL | Status: AC | PRN

## 2023-07-04 MED ORDER — DOCUSATE SODIUM 100 MG PO CAPS: 200.0000 mg | ORAL_CAPSULE | Freq: Two times a day (BID) | ORAL | Status: DC

## 2023-07-04 MED ORDER — SPIRONOLACTONE 25 MG PO TABS: 25.0000 mg | ORAL_TABLET | Freq: Every day | ORAL | Status: DC

## 2023-07-04 MED ORDER — HYDROCORTISONE (PERIANAL) 2.5 % EX CREA: TOPICAL_CREAM | Freq: Three times a day (TID) | CUTANEOUS | Status: DC

## 2023-07-04 MED ORDER — SPIRONOLACTONE 25 MG PO TABS
25.0000 mg | ORAL_TABLET | Freq: Every day | ORAL | Status: DC
  Administered 2023-07-04: 25 mg via ORAL
  Filled 2023-07-04: qty 1

## 2023-07-04 NOTE — Care Management Important Message (Signed)
 Important Message  Patient Details  Name: [name] MRN: [id number] Date of Birth: 05/13/1931   Important Message Given:  Yes - Medicare IM  Patient left prior to IM delivery will mail a copy to the patient home address.   Lux Skilton 07/04/2023, 2:20 PM

## 2023-07-04 NOTE — TOC Transition Note (Signed)
 Transition of Care [facility]) - Discharge Note   Patient Details  Name: [name] MRN: [id number] Date of Birth: [birth year]  Transition of Care [facility]) CM/SW Contact:  Inocente GORMAN Kindle, LCSW Phone Number: 07/04/2023, 10:59 AM   Clinical Narrative:    Patient will DC to: [facility] Anticipated DC date: 07/04/23 Family notified: Daughter, Dietitian by: ROME   Per MD patient ready for DC to [facility]. RN to call report prior to discharge [phone]). RN, patient, patient's family, and facility notified of DC. Discharge Summary and FL2 sent to facility. DC packet on chart including signed DNR. Ambulance transport requested for patient.   CSW will sign off for now as social work intervention is no longer needed. Please consult us  again if new needs arise.     Final next level of care: Skilled Nursing Facility Barriers to Discharge: Barriers Resolved   Patient Goals and CMS Choice Patient states their goals for this hospitalization and ongoing recovery are:: Return to rehab CMS Medicare.gov Compare Post Acute Care list provided to:: Patient Represenative (must comment)        Discharge Placement   Existing PASRR number confirmed : 07/04/23          Patient chooses bed at: [facility] [place] Patient to be transferred to facility by: [facility] Name of family member notified: Daughter Patient and family notified of of transfer: 07/04/23  Discharge Plan and Services Additional resources added to the After Visit Summary for   In-house Referral: Clinical Social Work Discharge Planning Services: CM Consult Post Acute Care Choice: Skilled Nursing Facility                               Social Drivers of Health (SDOH) Interventions SDOH Screenings   Food Insecurity: No Food Insecurity (07/02/2023)  Housing: Low Risk  (07/02/2023)  Transportation Needs: No Transportation Needs (07/02/2023)  Utilities: Not At Risk (07/02/2023)  Social Connections: Moderately Isolated  (07/02/2023)  Tobacco Use: Low Risk  (07/01/2023)     Readmission Risk Interventions     No data to display

## 2023-07-04 NOTE — Progress Notes (Signed)
 Heart Failure Navigator Progress Note  Assessed for Heart & Vascular TOC clinic readiness.  Patient does not meet criteria due to Advanced Heart Failure team patient of Dr. Gala Romney.   Navigator will sign off at this time.    Rhae Hammock, BSN, Scientist, clinical (histocompatibility and immunogenetics) Only

## 2023-07-04 NOTE — NC FL2 (Signed)
 [place]  MEDICAID [id number] LEVEL OF CARE FORM     IDENTIFICATION  Patient Name: [name] Birthdate: [birth year] Sex: female Admission Date (Current Location): 07/01/2023  [facility] and [place] Number:  Producer, television/film/video and Address:  [facility] [place], [address] [place], [place] [address]      Provider Number: [id number]  Attending Physician Name and Address:  Dennise Lavada POUR, MD  Relative Name and Phone Number:       Current Level of Care: Hospital Recommended Level of Care: Skilled Nursing Facility Prior Approval Number:    Date Approved/Denied:   PASRR Number: [id number]  Discharge Plan: SNF    Current Diagnoses: Patient Active Problem List   Diagnosis Date Noted   Paroxysmal atrial fibrillation (HCC) 07/02/2023   Coffee ground emesis 07/02/2023   Acute upper GI bleeding 07/01/2023   History of ventricular tachycardia 07/01/2023   Acute on chronic systolic CHF (congestive heart failure) (HCC) 07/01/2023   Chronic kidney disease, stage 3b (HCC) 07/01/2023   GI bleeding 07/01/2023   Hyperkalemia 07/01/2023   Sinus bradycardia 07/01/2023   Acute ST elevation myocardial infarction (STEMI) due to occlusion of left anterior descending (LAD) coronary artery (HCC) 06/19/2023   Antiplatelet or antithrombotic long-term use 10/06/2022   Persistent atrial fibrillation (HCC) 09/08/2022   PAF (paroxysmal atrial fibrillation) (HCC) 08/24/2022   Hypercoagulable state due to persistent atrial fibrillation (HCC) 08/24/2022   Atrial fibrillation with rapid ventricular response (HCC) 12/30/2021   Atrial fibrillation with RVR (HCC) 12/30/2021   Chronic kidney disease, stage 3 unspecified (HCC) 07/22/2021   Drug-induced myopathy 07/22/2021   History of transcatheter aortic valve replacement (TAVR) 07/22/2021   History of peptic ulcer 07/22/2021   Osteoporosis 07/22/2021   Personal history of malignant neoplasm of breast 07/22/2021   Rheumatic aortic  stenosis with regurgitation 07/22/2021   Senile osteopenia 07/22/2021   Vitamin D  deficiency 07/22/2021   History of total knee replacement, right 11/04/2019   Pain in joint of left knee 11/04/2019   Osteoarthritis of left knee 11/04/2019   Carotid artery disease (HCC)    ETD (Eustachian tube dysfunction), bilateral 05/10/2017   Seasonal allergic rhinitis 05/10/2017   Aortic stenosis, severe    Rheumatic fever    CAD (coronary artery disease), native coronary artery 10/20/2015   Weakness 05/08/2015   Breast cancer, right breast (HCC)    PUD (peptic ulcer disease)    Hypothyroidism    Hypercholesteremia    Obese 08/29/2013   S/P right THA, AA 08/28/2013   Chronic diastolic heart failure (HCC) 02/13/2013   SVT (supraventricular tachycardia) (HCC) 10/24/2012    Orientation RESPIRATION BLADDER Height & Weight     Self, Time, Situation, Place  Normal Incontinent Weight: 166 lb 10.7 oz (75.6 kg) Height:     BEHAVIORAL SYMPTOMS/MOOD NEUROLOGICAL BOWEL NUTRITION STATUS      Incontinent Diet (see dc summary)  AMBULATORY STATUS COMMUNICATION OF NEEDS Skin   Extensive Assist Verbally Normal                       Personal Care Assistance Level of Assistance  Bathing, Feeding, Dressing Bathing Assistance: Maximum assistance Feeding assistance: Limited assistance Dressing Assistance: Maximum assistance     Functional Limitations Info  Sight Sight Info: Impaired (Glasses)        SPECIAL CARE FACTORS FREQUENCY  PT (By licensed PT), OT (By licensed OT)     PT Frequency: 5x/week OT Frequency: 5x/week  Contractures Contractures Info: Not present    Additional Factors Info  Code Status, Allergies Code Status Info: DNR Allergies Info: Compazine (Prochlorperazine), Fenofibrate, Floxin (Ofloxacin), Statins           Current Medications (07/04/2023):  This is the current hospital active medication list Current Facility-Administered Medications  Medication  Dose Route Frequency Provider Last Rate Last Admin   0.9 %  sodium chloride  infusion (Manually program via Guardrails IV Fluids)   Intravenous Once Sundil, Subrina, MD   Held at 07/02/23 0034   acetaminophen  (TYLENOL ) tablet 650 mg  650 mg Oral Q6H PRN Sundil, Subrina, MD   650 mg at 07/03/23 2148   Or   acetaminophen  (TYLENOL ) suppository 650 mg  650 mg Rectal Q6H PRN Sundil, Subrina, MD       amiodarone  (PACERONE ) tablet 200 mg  200 mg Oral Daily Croitoru, Mihai, MD   200 mg at 07/04/23 0848   ascorbic acid  (VITAMIN C ) tablet 500 mg  500 mg Oral Daily Sundil, Subrina, MD   500 mg at 07/04/23 0848   clopidogrel  (PLAVIX ) tablet 75 mg  75 mg Oral Daily Sundil, Subrina, MD   75 mg at 07/04/23 0848   docusate sodium  (COLACE) capsule 200 mg  200 mg Oral BID Singh, Prashant K, MD   200 mg at 07/03/23 1048   furosemide  (LASIX ) tablet 40 mg  40 mg Oral Daily Croitoru, Mihai, MD   40 mg at 07/04/23 0848   hydrocortisone  (ANUSOL -HC) 2.5 % rectal cream   Rectal TID Singh, Prashant K, MD   Given at 07/04/23 0849   levothyroxine  (SYNTHROID ) tablet 112 mcg  112 mcg Oral QAC breakfast Sundil, Subrina, MD   112 mcg at 07/04/23 0849   mexiletine (MEXITIL ) capsule 150 mg  150 mg Oral Q8H Sundil, Subrina, MD   150 mg at 07/04/23 0559   ondansetron  (ZOFRAN ) tablet 4 mg  4 mg Oral Q6H PRN Sundil, Subrina, MD       Or   ondansetron  (ZOFRAN ) injection 4 mg  4 mg Intravenous Q6H PRN Sundil, Subrina, MD       pantoprazole  (PROTONIX ) injection 40 mg  40 mg Intravenous Q12H Sundil, Subrina, MD   40 mg at 07/04/23 0853   polyethylene glycol (MIRALAX  / GLYCOLAX ) packet 17 g  17 g Oral BID Singh, Prashant K, MD   17 g at 07/03/23 0827   sodium chloride  flush (NS) 0.9 % injection 3 mL  3 mL Intravenous Q12H Sundil, Subrina, MD   3 mL at 07/03/23 2150   spironolactone  (ALDACTONE ) tablet 25 mg  25 mg Oral Daily Singh, Prashant K, MD   25 mg at 07/04/23 9150     Discharge Medications: Please see discharge summary for a list  of discharge medications.  Relevant Imaging Results:  Relevant Lab Results:   Additional Information SSN:[id number]  Inocente GORMAN Kindle, LCSW

## 2023-07-04 NOTE — Plan of Care (Signed)
  Problem: Education: Goal: Knowledge of General Education information will improve Description: Including pain rating scale, medication(s)/side effects and non-pharmacologic comfort measures Outcome: Progressing   Problem: Clinical Measurements: Goal: Diagnostic test results will improve Outcome: Progressing   Problem: Activity: Goal: Risk for activity intolerance will decrease Outcome: Progressing   Problem: Nutrition: Goal: Adequate nutrition will be maintained Outcome: Progressing   Problem: Pain Managment: Goal: General experience of comfort will improve and/or be controlled Outcome: Progressing   Problem: Safety: Goal: Ability to remain free from injury will improve Outcome: Progressing   Problem: Activity: Goal: Capacity to carry out activities will improve Outcome: Progressing

## 2023-07-04 NOTE — TOC Progression Note (Addendum)
 Transition of Care [facility]) - Progression Note    Patient Details  Name: [name] MRN: [id number] Date of Birth: [birth year]  Transition of Care [facility]) CM/SW Contact  Inocente GORMAN Kindle, LCSW Phone Number: 07/04/2023, 8:59 AM  Clinical Narrative:    8:59am-Awaiting response from [facility] on patient's ability to return today. CSW updated patient's daughter who gave the facility a heads up this morning. [facility] will be needed for transport.    9:33 AM-[facility] is aware of discharge.   Expected Discharge Plan: Skilled Nursing Facility Barriers to Discharge: Barriers Resolved  Expected Discharge Plan and Services In-house Referral: Clinical Social Work Discharge Planning Services: CM Consult Post Acute Care Choice: Skilled Nursing Facility Living arrangements for the past 2 months: Single Family Home Expected Discharge Date: 07/04/23                                     Social Determinants of Health (SDOH) Interventions SDOH Screenings   Food Insecurity: No Food Insecurity (07/02/2023)  Housing: Low Risk  (07/02/2023)  Transportation Needs: No Transportation Needs (07/02/2023)  Utilities: Not At Risk (07/02/2023)  Social Connections: Moderately Isolated (07/02/2023)  Tobacco Use: Low Risk  (07/01/2023)    Readmission Risk Interventions     No data to display

## 2023-07-04 NOTE — Discharge Summary (Signed)
 [name] FMW:[id number] DOB: 1931-11-04 DOA: 07/01/2023  PCP: [name], MD  Admit date: 07/01/2023  Discharge date: 07/04/2023  Admitted From: SNF   Disposition:  SNF   Recommendations for Outpatient Follow-up:   Follow up with PCP in 1-2 weeks  PCP Please obtain BMP/CBC, 2 view CXR in 1week,  (see Discharge instructions)   PCP Please follow up on the following pending results:     Home Health: None   Equipment/Devices: None  Consultations: GI, Cards Discharge Condition: Stable    CODE STATUS: Full    Diet Recommendation: Heart Healthy 1.5 L fluid restriction per day    Chief Complaint  Patient presents with   GI Bleeding     Brief history of present illness from the day of admission and additional interim summary     89 y.o. female with medical history significant of recent STEMI 06/28/2023 underwent emergent heart cath status post LAD DES stent, HFrEF  40% to 45%, cardiogenic shock required norepinephrine  and milrinone , ventricular tachycardia fibrillation treated with IV lidocaine  and amiodarone -transition to amiodarone  and mexiletine, postcardiac arrest required CPR, limited GDMT due to low EF and blood pressure, on Plavix  and Xarelto  due to management of CAD and ventricular tachycardia, history of bioprosthetic TAVR 2018,  pulmonary hypertension, and CKD stage IIIb presented emergency department complaining of coffee-ground emesis.   Daughter at the bedside reported that nursing home called and informed her patient has 1 episode of coffee-ground emesis.  There is no episodes of melena or melena.  In the ED FOBT positive and physical exam did not showed any blood in the rectal vault.  History of internal hemorrhoid.                                                                 Hospital  Course   Acute upper GI bleeding with mild acute upper GI blood loss related anemia Seen by GI, recently had left heart catheterization and stent placement hence was on DAPT plus anticoagulation for underlying A-fib, currently only on Plavix , aspirin  and Xarelto  has been discontinued by cardiology, she was seen by GI.  Thankfully no further episodes of hematemesis or melena, CBC stable, will be discharged back to SNF on PPI and Plavix  only.  Recheck CBC, CMP in 5 to 7 days at SNF, arrange for outpatient GI follow-up with [facility] in 1 to 2 weeks.   Acute on chronic systolic CHF (congestive heart failure) (HCC) Echocardiogram with reduced LV systolic function EF 40 %, RV systolic function preserved, LA with moderate dilatation, sp TAVR. Hypokinetic entire septum, apical anterior segment and apex.  Cardiology was on board cardiology medications adjusted as below.  Currently symptom-free will be discharged with outpatient follow-up with her cardiologist in 1 to 2 weeks postdischarge.   CAD (coronary  artery disease), native coronary artery Recent STMI and drug eluding stent placement, currently on Plavix  only.  Aspirin  and Xarelto  have been discontinued.  Continue statin.  Cardiology was on board.  No acute issue.   Paroxysmal atrial fibrillation (HCC) and history of V. tach in the past Currently on combination of amiodarone  at a reduced dose along with mexiletine, DOAC and Plavix  have been discontinued.  Chronic kidney disease, stage 3b (HCC) with baseline creatinine between 1.6 and 1.9.  Mild hyponatremia. In mild volume overload, diuretics per cardiology.  Clinically stable, recheck BMP in 5 to 7 days at [facility].   Hypothyroidism Continue levothyroxine     Discharge diagnosis     Principal Problem:   Acute upper GI bleeding Active Problems:   Acute on chronic systolic CHF (congestive heart failure) (HCC)   CAD (coronary artery disease), native coronary artery   Paroxysmal atrial fibrillation  (HCC)   Chronic kidney disease, stage 3b (HCC)   Hypothyroidism   Antiplatelet or antithrombotic long-term use   Coffee ground emesis    Discharge instructions    Discharge Instructions     Discharge instructions   Complete by: As directed    Follow with Primary MD [name], MD in 7 days   Get CBC, CMP, Magnesium , 2 view Chest X ray -  checked next visit with your primary MD or SNF MD   Activity: As tolerated with Full fall precautions use walker/cane & assistance as needed  Disposition SNF  Diet: Heart Healthy strict 1.5 L fluid restriction per day  Special Instructions: If you have smoked or chewed Tobacco  in the last 2 yrs please stop smoking, stop any regular Alcohol  and or any Recreational drug use.  On your next visit with your primary care physician please Get Medicines reviewed and adjusted.  Please request your Prim.MD to go over all Hospital Tests and Procedure/Radiological results at the follow up, please get all Hospital records sent to your Prim MD by signing hospital release before you go home.  If you experience worsening of your admission symptoms, develop shortness of breath, life threatening emergency, suicidal or homicidal thoughts you must seek medical attention immediately by calling 911 or calling your MD immediately  if symptoms less severe.  You Must read complete instructions/literature along with all the possible adverse reactions/side effects for all the Medicines you take and that have been prescribed to you. Take any new Medicines after you have completely understood and accpet all the possible adverse reactions/side effects.   Do not drive when taking Pain medications.  Do not take more than prescribed Pain, Sleep and Anxiety Medications  Wear Seat belts while driving.   Increase activity slowly   Complete by: As directed        Discharge Medications   Allergies as of 07/04/2023       Reactions   Compazine [prochlorperazine]  Other (See Comments)   Unknown, pt does not remember   Fenofibrate Rash   Floxin [ofloxacin] Other (See Comments)   Unknown, pt does not remember reaction but was allergic reaction   Statins Other (See Comments)   Myalgia - Atorvastatin, simvastatin, pitavastatin        Medication List     STOP taking these medications    lidocaine  5 % Commonly known as: LIDODERM    potassium chloride  SA 20 MEQ tablet Commonly known as: KLOR-CON  M   Xarelto  15 MG Tabs tablet Generic drug: Rivaroxaban        TAKE these medications  acetaminophen  500 MG tablet Commonly known as: TYLENOL  Take 1 tablet (500 mg total) by mouth every 8 (eight) hours as needed for mild pain (pain score 1-3) or moderate pain (pain score 4-6). What changed:  how much to take when to take this reasons to take this Another medication with the same name was removed. Continue taking this medication, and follow the directions you see here.   amiodarone  200 MG tablet Commonly known as: PACERONE  Take 1 tablet (200 mg total) by mouth daily. What changed:  medication strength how much to take how to take this when to take this additional instructions   ascorbic acid  500 MG tablet Commonly known as: VITAMIN C  Take 500 mg by mouth daily.   clopidogrel  75 MG tablet Commonly known as: PLAVIX  Take 1 tablet (75 mg total) by mouth daily.   docusate sodium  100 MG capsule Commonly known as: COLACE Take 2 capsules (200 mg total) by mouth 2 (two) times daily.   furosemide  40 MG tablet Commonly known as: LASIX  Take 1 tablet (40 mg total) by mouth daily.   hydrocortisone  2.5 % rectal cream Commonly known as: ANUSOL -HC Place rectally 3 (three) times daily.   levothyroxine  112 MCG tablet Commonly known as: SYNTHROID  Take 112 mcg by mouth daily before breakfast.   Magnesium  400 MG Caps Take 400 mg by mouth in the morning and at bedtime.   mexiletine 150 MG capsule Commonly known as: MEXITIL  Take 1 capsule  (150 mg total) by mouth every 8 (eight) hours.   pantoprazole  40 MG tablet Commonly known as: PROTONIX  Take 1 tablet (40 mg total) by mouth daily.   polyethylene glycol 17 g packet Commonly known as: MIRALAX  / GLYCOLAX  Take 17 g by mouth daily.   spironolactone  25 MG tablet Commonly known as: ALDACTONE  Take 1 tablet (25 mg total) by mouth daily.         Follow-up Information     [name], MD. Schedule an appointment as soon as possible for a visit in 1 week(s).   Specialty: Internal Medicine Why: And your gastroenterologist and 1 to 2 weeks Contact information: [address] [place] [address] [phone]         Verlin Lonni BIRCH, MD. Schedule an appointment as soon as possible for a visit in 1 week(s).   Specialty: Cardiology Contact information: [address] [place] [address] [phone]                 Major procedures and Radiology Reports - PLEASE review detailed and final reports thoroughly  -        ECHOCARDIOGRAM LIMITED Result Date: 07/03/2023    ECHOCARDIOGRAM LIMITED REPORT   Patient Name:   [name] Date of Exam: 07/03/2023 Medical Rec #:  [id number]        Height:       63.0 in Accession #:    [id number]       Weight:       165.3 lb Date of Birth:  1931-07-27       BSA:          1.783 m Patient Age:    89 years         BP:           108/58 mmHg Patient Gender: F                HR:           56 bpm. Exam Location:  Inpatient Procedure:  Limited Echo, Cardiac Doppler and Limited Color Doppler (Both            Spectral and Color Flow Doppler were utilized during procedure). Indications:    acute systolic chf  History:        Patient has prior history of Echocardiogram examinations, most                 recent 06/20/2023. Cardiomyopathy, CAD; chronic kidney disease.                 Aortic Valve: 26 mm Edwards Sapien prosthetic, stented (TAVR)                 valve is present in the aortic position. Procedure Date:                  07/27/2016.  Sonographer:    Tinnie Barefoot RDCS Referring Phys: [id number] SUBRINA SUNDIL IMPRESSIONS  1. Left ventricular ejection fraction, by estimation, is 35 to 40%. The left ventricle has moderately decreased function. The left ventricle demonstrates regional wall motion abnormalities (see scoring diagram/findings for description). Left ventricular  diastolic function could not be evaluated.  2. Right ventricular systolic function is moderately reduced. The right ventricular size is mildly enlarged. There is moderately elevated pulmonary artery systolic pressure. The estimated right ventricular systolic pressure is 55.9 mmHg.  3. Severe mitral regurgitation is present. This is due to restricted movement of the PMVL in systole. The mitral valve is degenerative. Severe mitral valve regurgitation. Severe mitral annular calcification.  4. The aortic valve has been repaired/replaced. Aortic valve regurgitation is not visualized. There is a 26 mm Edwards Sapien prosthetic (TAVR) valve present in the aortic position. Procedure Date: 07/27/2016. Echo findings are consistent with normal structure and function of the aortic valve prosthesis. Aortic valve area, by VTI measures 2.46 cm. Aortic valve mean gradient measures 8.0 mmHg. Aortic valve Vmax measures 1.98 m/s.  5. The inferior vena cava is dilated in size with >50% respiratory variability, suggesting right atrial pressure of 8 mmHg. Comparison(s): Changes from prior study are noted. LVEF unchanged (35-40%) with known WMA in LAD distribution. There is now severe mitral regurgitation (IIIB pathology). RV dysfunction noted as well. FINDINGS  Left Ventricle: Left ventricular ejection fraction, by estimation, is 35 to 40%. The left ventricle has moderately decreased function. The left ventricle demonstrates regional wall motion abnormalities. The left ventricular internal cavity size was normal in size. There is no left ventricular hypertrophy. Left  ventricular diastolic function could not be evaluated. Left ventricular diastolic function could not be evaluated due to mitral regurgitation (moderate or greater).  LV Wall Scoring: The mid and distal anterior septum and apex are hypokinetic. Right Ventricle: The right ventricular size is mildly enlarged. No increase in right ventricular wall thickness. Right ventricular systolic function is moderately reduced. There is moderately elevated pulmonary artery systolic pressure. The tricuspid regurgitant velocity is 3.46 m/s, and with an assumed right atrial pressure of 8 mmHg, the estimated right ventricular systolic pressure is 55.9 mmHg. Pericardium: There is no evidence of pericardial effusion. Mitral Valve: Severe mitral regurgitation is present. This is due to restricted movement of the PMVL in systole. The mitral valve is degenerative in appearance. There is moderate calcification of the anterior and posterior mitral valve leaflet(s). Severe  mitral annular calcification. Severe mitral valve regurgitation. Tricuspid Valve: The tricuspid valve is grossly normal. Tricuspid valve regurgitation is mild . No evidence of tricuspid stenosis. Aortic Valve: The aortic valve has been  repaired/replaced. Aortic valve regurgitation is not visualized. Aortic valve mean gradient measures 8.0 mmHg. Aortic valve peak gradient measures 15.7 mmHg. Aortic valve area, by VTI measures 2.46 cm. There is a 26 mm Edwards Sapien prosthetic, stented (TAVR) valve present in the aortic position. Procedure Date: 07/27/2016. Echo findings are consistent with normal structure and function of the aortic valve prosthesis. Pulmonic Valve: The pulmonic valve was grossly normal. Pulmonic valve regurgitation is trivial. No evidence of pulmonic stenosis. Aorta: The aortic root and ascending aorta are structurally normal, with no evidence of dilitation. Venous: The inferior vena cava is dilated in size with greater than 50% respiratory variability,  suggesting right atrial pressure of 8 mmHg. Additional Comments: Spectral Doppler performed. Color Doppler performed.  LEFT VENTRICLE PLAX 2D LVIDd:         4.90 cm LVIDs:         3.50 cm LV PW:         1.00 cm LV IVS:        1.00 cm LVOT diam:     2.37 cm LV SV:         100 LV SV Index:   56 LVOT Area:     4.41 cm  LV Volumes (MOD) LV vol d, MOD A4C: 97.3 ml LV vol s, MOD A4C: 63.1 ml LV SV MOD A4C:     97.3 ml IVC IVC diam: 2.40 cm LEFT ATRIUM         Index LA diam:    4.90 cm 2.75 cm/m  AORTIC VALVE AV Area (Vmax):    2.50 cm AV Area (Vmean):   2.41 cm AV Area (VTI):     2.46 cm AV Vmax:           198.00 cm/s AV Vmean:          130.000 cm/s AV VTI:            0.407 m AV Peak Grad:      15.7 mmHg AV Mean Grad:      8.0 mmHg LVOT Vmax:         112.00 cm/s LVOT Vmean:        71.050 cm/s LVOT VTI:          0.227 m LVOT/AV VTI ratio: 0.56  AORTA Ao Asc diam: 3.10 cm MITRAL VALVE                  TRICUSPID VALVE MV Area (PHT): 3.42 cm       TR Peak grad:   47.9 mmHg MV Decel Time: 222 msec       TR Vmax:        346.00 cm/s MR Peak grad:    75.3 mmHg MR Vmax:         434.00 cm/s  SHUNTS MR PISA Nyquist: 0.4 m/s      Systemic VTI:  0.23 m MR PISA:         1.01 cm     Systemic Diam: 2.37 cm MR PISA Radius:  0.40 cm MV E velocity: 178.00 cm/s MV A velocity: 43.50 cm/s MV E/A ratio:  4.09 Darryle Decent MD Electronically signed by Darryle Decent MD Signature Date/Time: 07/03/2023/11:33:01 AM    Final    DG Chest Port 1 View Result Date: 07/03/2023 CLINICAL DATA:  Shortness of breath EXAM: PORTABLE CHEST 1 VIEW COMPARISON:  07/01/2023 FINDINGS: Stable cardiomediastinal contours. Aortic atherosclerotic calcifications. Status post TAVR. Decreased lung volumes. Increased pulmonary vascular congestion. Blunting of the left  costophrenic angle compatible with small effusion. No airspace consolidation or pneumothorax. Visualized osseous structures appear grossly intact. IMPRESSION: Increased pulmonary vascular congestion and  small left pleural effusion. Electronically Signed   By: Waddell Calk M.D.   On: 07/03/2023 07:47   DG Chest Portable 1 View Result Date: 07/01/2023 CLINICAL DATA:  dyspnea EXAM: PORTABLE CHEST - 1 VIEW COMPARISON:  June 21, 2023 FINDINGS: Central pulmonary vascular congestion. No focal airspace consolidation, pleural effusion, or pneumothorax. Mild cardiomegaly. Endovascular aortic valve replacement. Aortic atherosclerosis.No acute fracture or destructive lesion. IMPRESSION: Mild cardiomegaly with central pulmonary vascular congestion, unchanged. Electronically Signed   By: Rogelia Myers M.D.   On: 07/01/2023 21:48   DG CHEST PORT 1 VIEW Result Date: 06/21/2023 CLINICAL DATA:  Heart failure EXAM: PORTABLE CHEST 1 VIEW COMPARISON:  06/19/2023 FINDINGS: Status post TAVR. Interval removal of right IJ pulmonary catheter with right IJ sheath stone in place. The cardio pericardial silhouette is enlarged. There is pulmonary vascular congestion without overt pulmonary edema. Telemetry leads overlie the chest. IMPRESSION: Enlargement of the cardiopericardial silhouette with pulmonary vascular congestion. Electronically Signed   By: Camellia Candle M.D.   On: 06/21/2023 09:04   ECHOCARDIOGRAM COMPLETE Result Date: 06/20/2023    ECHOCARDIOGRAM REPORT   Patient Name:   [name] Date of Exam: 06/20/2023 Medical Rec #:  [id number]        Height:       60.0 in Accession #:    [id number]       Weight:       160.5 lb Date of Birth:  [birth year]       BSA:          1.700 m Patient Age:    89 years         BP:           94/50 mmHg Patient Gender: F                HR:           71 bpm. Exam Location:  Inpatient Procedure: 2D Echo, Cardiac Doppler and Color Doppler (Both Spectral and Color            Flow Doppler were utilized during procedure). Indications:    CHF  History:        Patient has prior history of Echocardiogram examinations. CHF.                 Aortic Valve: 26 mm valve is present in the aortic position.   Sonographer:    Vella Key Referring Phys: [id number] DANIEL R BENSIMHON IMPRESSIONS  1. Systolic dysfunction is new and consistent with LAD infarct.  2. Left ventricular ejection fraction, by estimation, is 40 to 45%. The left ventricle has mildly decreased function. The left ventricle demonstrates regional wall motion abnormalities (see scoring diagram/findings for description). Left ventricular diastolic parameters are consistent with Grade II diastolic dysfunction (pseudonormalization). Elevated left ventricular end-diastolic pressure.  3. Right ventricular systolic function is normal. The right ventricular size is normal. There is normal pulmonary artery systolic pressure.  4. Left atrial size was moderately dilated.  5. The mitral valve is normal in structure. Trivial mitral valve regurgitation. No evidence of mitral stenosis.  6. The aortic valve has been repaired/replaced. Aortic valve regurgitation is mild. Mild aortic valve stenosis. There is a 26 mm valve present in the aortic position. Echo findings are consistent with normal structure and function of the aortic valve prosthesis. Aortic valve area, by  VTI measures 0.88 cm. Aortic valve mean gradient measures 11.0 mmHg. Aortic valve Vmax measures 2.13 m/s.  7. The inferior vena cava is normal in size with greater than 50% respiratory variability, suggesting right atrial pressure of 3 mmHg. FINDINGS  Left Ventricle: Left ventricular ejection fraction, by estimation, is 40 to 45%. The left ventricle has mildly decreased function. The left ventricle demonstrates regional wall motion abnormalities. The left ventricular internal cavity size was normal in size. There is no left ventricular hypertrophy. Left ventricular diastolic parameters are consistent with Grade II diastolic dysfunction (pseudonormalization). Elevated left ventricular end-diastolic pressure.  LV Wall Scoring: The entire septum, apical anterior segment, and apex are hypokinetic. The anterior wall,  entire lateral wall, and entire inferior wall are normal. Right Ventricle: The right ventricular size is normal. No increase in right ventricular wall thickness. Right ventricular systolic function is normal. There is normal pulmonary artery systolic pressure. The tricuspid regurgitant velocity is 1.92 m/s, and  with an assumed right atrial pressure of 3 mmHg, the estimated right ventricular systolic pressure is 17.7 mmHg. Left Atrium: Left atrial size was moderately dilated. Right Atrium: Right atrial size was normal in size. Pericardium: There is no evidence of pericardial effusion. Mitral Valve: The mitral valve is normal in structure. Trivial mitral valve regurgitation. No evidence of mitral valve stenosis. Tricuspid Valve: The tricuspid valve is normal in structure. Tricuspid valve regurgitation is mild . No evidence of tricuspid stenosis. Aortic Valve: The aortic valve has been repaired/replaced. Aortic valve regurgitation is mild. Mild aortic stenosis is present. Aortic valve mean gradient measures 11.0 mmHg. Aortic valve peak gradient measures 18.1 mmHg. Aortic valve area, by VTI measures 0.88 cm. There is a 26 mm valve present in the aortic position. Echo findings are consistent with normal structure and function of the aortic valve prosthesis. Pulmonic Valve: The pulmonic valve was normal in structure. Pulmonic valve regurgitation is mild to moderate. No evidence of pulmonic stenosis. Aorta: The aortic root is normal in size and structure. Venous: The inferior vena cava is normal in size with greater than 50% respiratory variability, suggesting right atrial pressure of 3 mmHg. IAS/Shunts: No atrial level shunt detected by color flow Doppler.  LEFT VENTRICLE PLAX 2D LVIDd:         4.40 cm     Diastology LVIDs:         3.30 cm     LV e' medial:    4.79 cm/s LV PW:         1.10 cm     LV E/e' medial:  27.6 LV IVS:        1.20 cm     LV e' lateral:   3.59 cm/s LVOT diam:     1.50 cm     LV E/e' lateral: 36.8  LV SV:         36 LV SV Index:   21 LVOT Area:     1.77 cm  LV Volumes (MOD) LV vol d, MOD A2C: 75.9 ml LV vol d, MOD A4C: 94.0 ml LV vol s, MOD A2C: 33.8 ml LV vol s, MOD A4C: 39.3 ml LV SV MOD A2C:     42.1 ml LV SV MOD A4C:     94.0 ml LV SV MOD BP:      48.2 ml RIGHT VENTRICLE RV Basal diam:  3.70 cm RV S prime:     16.90 cm/s LEFT ATRIUM             Index  RIGHT ATRIUM           Index LA diam:        4.60 cm 2.71 cm/m   RA Area:     14.00 cm LA Vol (A2C):   87.0 ml 51.18 ml/m  RA Volume:   37.80 ml  22.24 ml/m LA Vol (A4C):   37.8 ml 22.24 ml/m LA Biplane Vol: 60.9 ml 35.82 ml/m  AORTIC VALVE AV Area (Vmax):    0.85 cm AV Area (Vmean):   0.89 cm AV Area (VTI):     0.88 cm AV Vmax:           213.00 cm/s AV Vmean:          158.000 cm/s AV VTI:            0.414 m AV Peak Grad:      18.1 mmHg AV Mean Grad:      11.0 mmHg LVOT Vmax:         103.00 cm/s LVOT Vmean:        79.500 cm/s LVOT VTI:          0.206 m LVOT/AV VTI ratio: 0.50  AORTA Ao Root diam: 2.80 cm Ao Asc diam:  3.10 cm MITRAL VALVE                TRICUSPID VALVE MV Area (PHT): 4.89 cm     TR Peak grad:   14.7 mmHg MV Decel Time: 155 msec     TR Vmax:        192.00 cm/s MV E velocity: 132.00 cm/s MV A velocity: 74.90 cm/s   SHUNTS MV E/A ratio:  1.76         Systemic VTI:  0.21 m                             Systemic Diam: 1.50 cm Annabella Scarce MD Electronically signed by Annabella Scarce MD Signature Date/Time: 06/20/2023/12:03:57 PM    Final    CARDIAC CATHETERIZATION Result Date: 06/19/2023 Images from the original result were not included. Coronary angiography and intervention 06/19/2023: LM: No significant disease LAD: Mid vessel 99% stenosis with minimal flow distally, followed by mild mid 30% disease.         Large diagonal 1, ostial 90% stenosis Lcx: No significant disease RCA: Dominant vessel, no significant disease LVEDP 15 mmHg Successful percutaneous coronary intervention mid LAD        PTCA and stent placement 3.0 X 20  mm Synergy drug-eluting stent        Postdilatation using 3.5 x 15 mm [place] balloon up to 16 atm Right heart catheterization 06/19/2023: RA: 10 mmHg RV: 77/10 mmHg PA: 74/25 mmHg, mPAP 45 mmHg PCW: 25 mmHg AO sats: 97% PA sats: 57% Fick: CO: 3.25 L/min CI: 1.94 L/min/m2 TD: CO: 2.93 L/min CI: 1.75 L/min/m2 Patient is in cardiogenic shock due to anterior STEMI, with LVEF 15% with severe global hypokinesis, and anterior akinesis.  Lactic acid was 0.6.  She is currently on 7 mics of norepinephrine .  Given her age, recent use of Xarelto , I would prefer managing her medically as much as possible to avoid large-bore access and risk of complications for hemodynamic support device.  Discussed with Dr. Cherrie who agrees. Newman JINNY Lawrence, MD   DG CHEST PORT 1 VIEW Result Date: 06/19/2023 CLINICAL DATA:  STEMI, Swan-Ganz catheter placement EXAM: PORTABLE CHEST 1 VIEW COMPARISON:  12/30/2021 FINDINGS: Single frontal  view of the chest demonstrates right internal jugular flow directed central venous catheter tip overlying right pulmonary artery. Aortic valve prosthesis again noted. Cardiac silhouette remains enlarged. Mild pulmonary vascular congestion without airspace disease, effusion, or pneumothorax. No acute bony abnormalities. IMPRESSION: 1. No complication after right internal jugular flow directed central venous catheter placement. 2. Mild pulmonary vascular congestion. Electronically Signed   By: Ozell Daring M.D.   On: 06/19/2023 16:33    Micro Results     No results found for this or any previous visit (from the past 240 hours).  Today   Subjective    [name] today has no headache,no chest abdominal pain,no new weakness tingling or numbness, feels much better wants to go home today.     Objective   Blood pressure (!) 107/56, pulse (!) 54, temperature 98.2 F (36.8 C), temperature source Oral, resp. rate 18, weight 75.6 kg, SpO2 97%.   Intake/Output Summary (Last 24 hours) at  07/04/2023 0835 Last data filed at 07/03/2023 1238 Gross per 24 hour  Intake --  Output 600 ml  Net -600 ml    Exam  Awake Alert, No new F.N deficits,    [place].AT,PERRAL Supple Neck,   Symmetrical Chest wall movement, Good air movement bilaterally, CTAB RRR,No Gallops,   +ve B.Sounds, Abd Soft, Non tender,  No Cyanosis, Clubbing or edema    Data Review   Recent Labs  Lab 06/28/23 0257 07/01/23 2138 07/01/23 2354 07/02/23 0600 07/03/23 0753 07/04/23 0621  WBC 6.8 9.2  --  7.7 6.9 5.5  HGB 9.3* 10.0* 9.7* 9.8* 10.0* 9.5*  HCT 28.4* 31.0* 30.3* 30.2* 30.6* 29.5*  PLT 292 338  --  328 311 282  MCV 104.4* 108.8*  --  107.9* 108.5* 107.7*  MCH 34.2* 35.1*  --  35.0* 35.5* 34.7*  MCHC 32.7 32.3  --  32.5 32.7 32.2  RDW 14.1 15.8*  --  15.8* 16.2* 16.3*  LYMPHSABS  --  0.9  --   --   --  0.5*  MONOABS  --  0.9  --   --   --  0.6  EOSABS  --  0.2  --   --   --  0.2  BASOSABS  --  0.0  --   --   --  0.0    Recent Labs  Lab 06/28/23 0257 07/01/23 2138 07/02/23 0600 07/03/23 0753 07/04/23 0621  NA 133* 129* 131* 132* 128*  K 4.1 5.3* 4.7 4.1 3.8  CL 98 98 98 97* 94*  CO2 23 20* 25 23 23   ANIONGAP 12 11 8 12 11   GLUCOSE 102* 127* 94 92 83  BUN 37* 41* 40* 35* 29*  CREATININE 1.71* 1.97* 1.91* 1.67* 1.56*  AST  --  36 47*  --   --   ALT  --  24 34  --   --   ALKPHOS  --  84 87  --   --   BILITOT  --  1.1 1.5*  --   --   ALBUMIN   --  2.9* 2.7*  --   --   INR  --   --  3.6*  --   --   TSH  --   --   --   --  1.850  BNP  --  4,445.8*  --   --   --   MG 1.9  --   --  2.2 2.0  CALCIUM 8.7* 8.6* 8.8* 8.8* 8.5*  Total Time in preparing paper work, data evaluation and todays exam - 35 minutes  Signature  -    Lavada Stank M.D on 07/04/2023 at 8:35 AM   -  To page go to [email or web address]

## 2023-07-04 NOTE — Discharge Instructions (Signed)
 Follow with Primary MD [name], MD in 7 days   Get CBC, CMP, Magnesium , 2 view Chest X ray -  checked next visit with your primary MD or SNF MD   Activity: As tolerated with Full fall precautions use walker/cane & assistance as needed  Disposition SNF  Diet: Heart Healthy strict 1.5 L fluid restriction per day  Special Instructions: If you have smoked or chewed Tobacco  in the last 2 yrs please stop smoking, stop any regular Alcohol  and or any Recreational drug use.  On your next visit with your primary care physician please Get Medicines reviewed and adjusted.  Please request your Prim.MD to go over all Hospital Tests and Procedure/Radiological results at the follow up, please get all Hospital records sent to your Prim MD by signing hospital release before you go home.  If you experience worsening of your admission symptoms, develop shortness of breath, life threatening emergency, suicidal or homicidal thoughts you must seek medical attention immediately by calling 911 or calling your MD immediately  if symptoms less severe.  You Must read complete instructions/literature along with all the possible adverse reactions/side effects for all the Medicines you take and that have been prescribed to you. Take any new Medicines after you have completely understood and accpet all the possible adverse reactions/side effects.   Do not drive when taking Pain medications.  Do not take more than prescribed Pain, Sleep and Anxiety Medications  Wear Seat belts while driving.

## 2023-07-05 LAB — BPAM RBC
Blood Product Expiration Date: 202507232359
Unit Type and Rh: 6200

## 2023-07-05 LAB — TYPE AND SCREEN
ABO/RH(D): A POS
Antibody Screen: NEGATIVE
Unit division: 0

## 2023-07-06 NOTE — Progress Notes (Signed)
 [facility] CONSULT NOTE  Referring Physician: Auston Opal, DO Primary Care: Auston Opal, DO Primary Cardiologist: Lonni Cash, MD Heart Failure Cardiologist: Toribio Fuel, MD  Chief Complaint: chronic systolic heart failure HPI: [name] is a 89 y.o. female with 89 y.o. female with hyperlipidemia, SVT, PAF, severe AS s/p advert SAPIEN 3 bioprosthetic TAVR 2018, mild nonobstructive CAD previously.   Admitted 6/25 with NSTEMI.  ECG c/w anterolateral STEMI. Taken emergently to cath lab by Dr. Elmira. Cath with 99% pLAD with minimal flow distally. Lcx/RCA ok EF 25%  Treated with 3.0 X 20 mm Synergy drug-eluting stent. RHC RA 10 PA 74/25 (45) PCW 25 Fick 3.3/1.9 TD 2.9/1.7 on NE 7. LVEDP 15. Echo 6/16: EF 40-45%, G2DD, nl RV. During stay she was noted to have frequent runs of polymorphic NSVT and she was started on IV amiodarone . Unfortunately during the night a code blue was called for sustained VT>VF. Required 1 round of CPR with ROSC. Started on lidocaine . Rates down to 30s, amiodarone  briefly held. Again with reperfusion ectopy and amiodarone  restarted. Lidocaine  turned off d/t elevated level and amiodarone  transitioned to PO mexiletine at discharge. Discharged to SNF.  Admitted later that week after discharge with GIB. Family deferred scope. ASA and Xarelto  stopped. Discharged with clopidogrel . Plan for outpatient GI follow up. Discharged back to SNF.   Today she returns for post hospital follow up with her friend. Overall feeling ok just a little shaky this morning. Denies palpitations, CP, dizziness, or PND/Orthopnea. Trace BLE edema, she tries to keep her legs elevated. SOB with exertion, resolves with rest. She is able to complete therapies at SNF (works with them several times a week). Appetite ok, avoid salt. No fever or chills. Does not remember weight from facility. Taking all medications.    Past Medical History:  Diagnosis Date   Arthritis    hands,  knees   CAD (coronary artery disease), native coronary artery cardiologist--- dr cash   a. mild non obst CAD per cath 07-18-2015   Carotid artery disease (HCC)    Carotid US  09/2018: Bilateral ICA 40-59 // Carotid US  11/21: Bilateral ICA 40-59; repeat 1 year    Chronic diastolic CHF (congestive heart failure) (HCC)    followed by cardiology   CKD (chronic kidney disease), stage III (HCC)    followed by pcp   Dyspnea    per pt when she walks which is usual for her   History of gastric ulcer    remote yrs ago   History of rheumatic fever as a child    History of right breast cancer 2006   s/p right partial mastectomy w/ node dissection's,  completed chemo/ radiation same year  (05-21-2020 pt stated no recurrence)   Hyperlipidemia    Hypothyroidism    followed by pcp   Osteoporosis    PMB (postmenopausal bleeding)    PSVT (paroxysmal supraventricular tachycardia) (HCC) 10/2012   in ED resolved w/ vagal maneuver    S/P aortic valve replacement 07/27/2016   for severe stenosis;  last echo in epic 09-22-2018 ef 55-60%, mild TR, aortic valve area 1.4cm^2 and mean gradiant 17 mmHg   Seasonal allergies    Vitamin D  deficiency     Current Outpatient Medications  Medication Sig Dispense Refill   acetaminophen  (TYLENOL ) 500 MG tablet Take 1 tablet (500 mg total) by mouth every 8 (eight) hours as needed for mild pain (pain score 1-3) or moderate pain (pain score 4-6).     ALPRAZolam (  NIRAVAM) 0.25 MG dissolvable tablet Take 0.25 mg by mouth at bedtime as needed for anxiety.     amiodarone  (PACERONE ) 200 MG tablet Take 1 tablet (200 mg total) by mouth daily.     clopidogrel  (PLAVIX ) 75 MG tablet Take 1 tablet (75 mg total) by mouth daily.     docusate sodium  (COLACE) 100 MG capsule Take 2 capsules (200 mg total) by mouth 2 (two) times daily.     furosemide  (LASIX ) 40 MG tablet Take 1 tablet (40 mg total) by mouth daily.     hydrocortisone  (ANUSOL -HC) 2.5 % rectal cream Place rectally 3  (three) times daily.     levothyroxine  (SYNTHROID , LEVOTHROID) 112 MCG tablet Take 112 mcg by mouth daily before breakfast.     Magnesium  400 MG CAPS Take 400 mg by mouth in the morning and at bedtime.     mexiletine (MEXITIL ) 150 MG capsule Take 1 capsule (150 mg total) by mouth every 8 (eight) hours.     pantoprazole  (PROTONIX ) 40 MG tablet Take 1 tablet (40 mg total) by mouth daily.     polyethylene glycol (MIRALAX  / GLYCOLAX ) 17 g packet Take 17 g by mouth daily.     spironolactone  (ALDACTONE ) 25 MG tablet Take 1 tablet (25 mg total) by mouth daily.     vitamin C  (ASCORBIC ACID ) 500 MG tablet Take 500 mg by mouth daily.     No current facility-administered medications for this encounter.   Allergies  Allergen Reactions   Compazine [Prochlorperazine] Other (See Comments)    Unknown, pt does not remember   Fenofibrate Rash   Floxin [Ofloxacin] Other (See Comments)    Unknown, pt does not remember reaction but was allergic reaction   Statins Other (See Comments)    Myalgia - Atorvastatin, simvastatin, pitavastatin   Social History   Socioeconomic History   Marital status: Widowed    Spouse name: Not on file   Number of children: Not on file   Years of education: Not on file   Highest education level: Not on file  Occupational History   Not on file  Tobacco Use   Smoking status: Never   Smokeless tobacco: Never   Tobacco comments:    Never smoked 08/24/22  Vaping Use   Vaping status: Never Used  Substance and Sexual Activity   Alcohol use: No   Drug use: Never   Sexual activity: Not Currently    Birth control/protection: Post-menopausal  Other Topics Concern   Not on file  Social History Narrative   Not on file   Social Drivers of Health   Financial Resource Strain: Not on file  Food Insecurity: No Food Insecurity (07/02/2023)   Hunger Vital Sign    Worried About Running Out of Food in the Last Year: Never true    Ran Out of Food in the Last Year: Never true   Transportation Needs: No Transportation Needs (07/02/2023)   PRAPARE - Administrator, Civil Service (Medical): No    Lack of Transportation (Non-Medical): No  Physical Activity: Not on file  Stress: Not on file  Social Connections: Moderately Isolated (07/02/2023)   Social Connection and Isolation Panel    Frequency of Communication with Friends and Family: Three times a week    Frequency of Social Gatherings with Friends and Family: Three times a week    Attends Religious Services: More than 4 times per year    Active Member of Clubs or Organizations: No    Attends  Club or Organization Meetings: Never    Marital Status: Widowed  Intimate Partner Violence: Not At Risk (07/02/2023)   Humiliation, Afraid, Rape, and Kick questionnaire    Fear of Current or Ex-Partner: No    Emotionally Abused: No    Physically Abused: No    Sexually Abused: No    Family History  Problem Relation Age of Onset   Heart disease Mother    Heart disease Father    Vitals:   07/18/23 0959  BP: 104/66  Pulse: (!) 58  Weight: 69.4 kg (153 lb)  Height: 5' 1 (1.549 m)    PHYSICAL EXAM: General:  elderly appearing.  No respiratory difficulty. Arrived in [facility].  Neck: supple. JVD flat.  Cor: PMI nondisplaced. Regular rate & rhythm. No rubs, gallops or murmurs. Lungs: clear Extremities: no cyanosis, clubbing, rash, +1 BLE edema  Neuro: alert & oriented x 3. Moves all 4 extremities w/o difficulty. Affect pleasant.   ECG: SB 1AVB 50s (Personally reviewed)    ASSESSMENT & PLAN: CAD with acute anterior STEMI - Cath 06/19/23: 99% pLAD with minimal flow distally s/p DES. Lcx/RCA ok EF 25%  - Continue Plavix .  - Off Xarelto  with GIB. Denies further bleeding.  CBC today.  - C/b post MI VT, due to reperfusion, improved - No anginal symptoms - myalgias with statins   Chronic Systolic Heart Failure - Diagnosed 6/25 - Ischemic CM: EF ~25% on cath (EF previously 55-60% in 11/23) - RHC 6/15: RA 10 PA  74/25 (45) PCW 25 Fick 3.3/1.9 TD 2.9/1.7 on NE 7 in cath lab   - Echo 6/16: EF 40-45%, G2DD, nl RV - No inotropes with recent VT/VF - GDMT limited by soft BP - Change Lasix  to 20-40 mg daily PRN for weight gain >3lbs overnight or 5lbs in 1 week.  - Start Jardiance  10 mg daily. Denies GU symptoms or hx of UTIs. Repeat BMET in 7-10 days. - Continue spironolactone  25mg  daily  - Consider low-dose ARB as tolerated. BP borderline and adding SGLT2i today - Ongoing fatigue, will check anemia panel. Avoid BB at this time - Encouraged to wear ted hose vs compression socks. Prescription given today.    VT/VF NSVT - 6/16 developed persistent, long runs of NSVT 5/16 on tele. Started on Amio and Lidocaine . Lytes replaced - 6/17 overnight sustained VT/VF with lost pulse, received 1 round CPR + 300 mg amio bolus with ROSC and conversion to NSR. - Keep K >4 and Mg >2 - continue mexiletine 150 mg tid - Continue amio 200 mg daily - Labs today.    Pulmonary HTN - mixed picture (both pre and post-capillary) - HF treatment as above   H/o AS s/p TAVR 2018 - mild AI and AS. Stable valve. AVA 0.88cm2.    PAF - Off AC with GIB - continue amio and Mexiletine   CKD2 - b/l Cr 1.3-1.5 - creatinine stable, last 1.56 6/30 - Continue spironolactone  - Start SGLT2i   GIB - recently admitted with GIB - ASA and Xarelto  stopped - Will refer to GI as she was discharged without GI follow up.   Follow up in 4-6 weeks with APP for further GDMT titration (add ARB if tolerated). Follow up in 2-3 months with Dr. Cherrie + echo.    [name] AGACNP-BC   [facility] Failure [facility] [address] Heart and Vascular [place] [address] [phone] (office)

## 2023-07-10 DIAGNOSIS — N1832 Chronic kidney disease, stage 3b: Secondary | ICD-10-CM | POA: Diagnosis not present

## 2023-07-10 DIAGNOSIS — M1712 Unilateral primary osteoarthritis, left knee: Secondary | ICD-10-CM | POA: Diagnosis not present

## 2023-07-10 DIAGNOSIS — E785 Hyperlipidemia, unspecified: Secondary | ICD-10-CM | POA: Diagnosis not present

## 2023-07-10 DIAGNOSIS — I2102 ST elevation (STEMI) myocardial infarction involving left anterior descending coronary artery: Secondary | ICD-10-CM | POA: Diagnosis not present

## 2023-07-10 DIAGNOSIS — Z8679 Personal history of other diseases of the circulatory system: Secondary | ICD-10-CM | POA: Diagnosis not present

## 2023-07-10 DIAGNOSIS — R2681 Unsteadiness on feet: Secondary | ICD-10-CM | POA: Diagnosis not present

## 2023-07-10 DIAGNOSIS — K922 Gastrointestinal hemorrhage, unspecified: Secondary | ICD-10-CM | POA: Diagnosis not present

## 2023-07-10 DIAGNOSIS — E039 Hypothyroidism, unspecified: Secondary | ICD-10-CM | POA: Diagnosis not present

## 2023-07-11 DIAGNOSIS — R0602 Shortness of breath: Secondary | ICD-10-CM | POA: Diagnosis not present

## 2023-07-13 ENCOUNTER — Encounter (HOSPITAL_COMMUNITY): Payer: Self-pay

## 2023-07-13 ENCOUNTER — Telehealth (HOSPITAL_COMMUNITY): Payer: Self-pay

## 2023-07-13 NOTE — Telephone Encounter (Signed)
 Attempted to call patient regarding interest in cardiac rehab program. No answer, left message to call us  back.   Mailed letter.

## 2023-07-18 ENCOUNTER — Other Ambulatory Visit (HOSPITAL_COMMUNITY): Payer: Self-pay | Admitting: *Deleted

## 2023-07-18 ENCOUNTER — Ambulatory Visit (HOSPITAL_COMMUNITY)
Admit: 2023-07-18 | Discharge: 2023-07-18 | Disposition: A | Discharge status: Home / Self Care | Source: Ambulatory Visit | Attending: Family Medicine | Admitting: Family Medicine

## 2023-07-18 ENCOUNTER — Other Ambulatory Visit (HOSPITAL_COMMUNITY): Payer: Self-pay

## 2023-07-18 ENCOUNTER — Telehealth (HOSPITAL_COMMUNITY): Payer: Self-pay

## 2023-07-18 ENCOUNTER — Ambulatory Visit (HOSPITAL_COMMUNITY): Payer: Self-pay | Admitting: Internal Medicine

## 2023-07-18 ENCOUNTER — Encounter (HOSPITAL_COMMUNITY): Payer: Self-pay

## 2023-07-18 VITALS — BP 104/66 | HR 58 | Ht 61.0 in | Wt 153.0 lb

## 2023-07-18 DIAGNOSIS — N1832 Chronic kidney disease, stage 3b: Secondary | ICD-10-CM | POA: Diagnosis not present

## 2023-07-18 DIAGNOSIS — I272 Pulmonary hypertension, unspecified: Secondary | ICD-10-CM | POA: Diagnosis not present

## 2023-07-18 DIAGNOSIS — I2510 Atherosclerotic heart disease of native coronary artery without angina pectoris: Secondary | ICD-10-CM | POA: Diagnosis not present

## 2023-07-18 DIAGNOSIS — I471 Supraventricular tachycardia, unspecified: Secondary | ICD-10-CM | POA: Diagnosis not present

## 2023-07-18 DIAGNOSIS — I252 Old myocardial infarction: Secondary | ICD-10-CM | POA: Diagnosis not present

## 2023-07-18 DIAGNOSIS — Z7902 Long term (current) use of antithrombotics/antiplatelets: Secondary | ICD-10-CM | POA: Insufficient documentation

## 2023-07-18 DIAGNOSIS — I5022 Chronic systolic (congestive) heart failure: Secondary | ICD-10-CM | POA: Diagnosis not present

## 2023-07-18 DIAGNOSIS — K922 Gastrointestinal hemorrhage, unspecified: Secondary | ICD-10-CM | POA: Diagnosis not present

## 2023-07-18 DIAGNOSIS — E611 Iron deficiency: Secondary | ICD-10-CM

## 2023-07-18 DIAGNOSIS — N182 Chronic kidney disease, stage 2 (mild): Secondary | ICD-10-CM | POA: Diagnosis not present

## 2023-07-18 DIAGNOSIS — E785 Hyperlipidemia, unspecified: Secondary | ICD-10-CM | POA: Diagnosis not present

## 2023-07-18 DIAGNOSIS — I480 Paroxysmal atrial fibrillation: Secondary | ICD-10-CM | POA: Diagnosis not present

## 2023-07-18 DIAGNOSIS — Z952 Presence of prosthetic heart valve: Secondary | ICD-10-CM | POA: Diagnosis not present

## 2023-07-18 DIAGNOSIS — R002 Palpitations: Secondary | ICD-10-CM

## 2023-07-18 LAB — CBC
HCT: 38.4 % (ref 36.0–46.0)
Hemoglobin: 12.4 g/dL (ref 12.0–15.0)
MCH: 35.2 pg — ABNORMAL HIGH (ref 26.0–34.0)
MCHC: 32.3 g/dL (ref 30.0–36.0)
MCV: 109.1 fL — ABNORMAL HIGH (ref 80.0–100.0)
Platelets: 325 K/uL (ref 150–400)
RBC: 3.52 MIL/uL — ABNORMAL LOW (ref 3.87–5.11)
RDW: 16.5 % — ABNORMAL HIGH (ref 11.5–15.5)
WBC: 5 K/uL (ref 4.0–10.5)
nRBC: 0 % (ref 0.0–0.2)

## 2023-07-18 LAB — BASIC METABOLIC PANEL WITH GFR
Anion gap: 11 (ref 5–15)
BUN: 14 mg/dL (ref 8–23)
CO2: 30 mmol/L (ref 22–32)
Calcium: 9.6 mg/dL (ref 8.9–10.3)
Chloride: 96 mmol/L — ABNORMAL LOW (ref 98–111)
Creatinine, Ser: 1.46 mg/dL — ABNORMAL HIGH (ref 0.44–1.00)
GFR, Estimated: 34 mL/min — ABNORMAL LOW (ref >60)
Glucose, Bld: 104 mg/dL — ABNORMAL HIGH (ref 70–99)
Potassium: 3.5 mmol/L (ref 3.5–5.1)
Sodium: 137 mmol/L (ref 135–145)

## 2023-07-18 LAB — FERRITIN: Ferritin: 158 ng/mL (ref 11–307)

## 2023-07-18 LAB — IRON AND TIBC
Iron: 49 ug/dL (ref 28–170)
Saturation Ratios: 16 % (ref 10.4–31.8)
TIBC: 300 ug/dL (ref 250–450)
UIBC: 251 ug/dL

## 2023-07-18 MED ORDER — EMPAGLIFLOZIN 10 MG PO TABS: 10.0000 mg | ORAL_TABLET | Freq: Every day | ORAL | 3 refills | Status: DC

## 2023-07-18 MED ORDER — FUROSEMIDE 40 MG PO TABS: ORAL_TABLET | ORAL | Status: DC

## 2023-07-18 NOTE — Patient Instructions (Signed)
 Medication Changes:  CHANGE LASIX  (FUROSEMIDE ) TO 20-40MG  DAILY AS NEEDED FOR WEIGHT GAIN OF 3 POUNDS OVERNIGHT OR 5 POUNDS IN 1 WEEK   START JARDIANCE  10MG  ONCE DAILY   Lab Work:  Labs done today, your results will be available in MyChart, we will contact you for abnormal readings.  Testing/Procedures:  ECHOCARDIOGRAM IN 2-3 MONTHS AT NEXT VISIT WITH DR. CHERRIE PLEASE CALL OUR OFFICE AROUND SEPTEMBER TO GET SCHEDULED FOR YOUR APPOINTMENT. PHONE NUMBER IS [phone] OPTION 2   Referrals:  YOU HAVE BEEN REFERRED TO GASTROENTEROLOGY THEY WILL REACH OUT TO YOU OR CALL TO ARRANGE THIS. PLEASE CALL US  WITH ANY CONCERNS   Special Instructions // Education:  ORDER GIVEN FOR COMPRESSION STOCKINGS   Follow-Up in: 4-6 WEEKS WITH NP/PA AS SCHEDULED   THEN AGAIN IN 2-3 MONTHS WITH DR. CHERRIE PLEASE CALL OUR OFFICE AROUND SEPTEMBER TO GET SCHEDULED FOR YOUR APPOINTMENT. PHONE NUMBER IS [phone] OPTION 2   At the [facility], you and your health needs are our priority. We have a designated team specialized in the treatment of Heart Failure. This Care Team includes your primary Heart Failure Specialized Cardiologist (physician), Advanced Practice Providers (APPs- Physician Assistants and Nurse Practitioners), and Pharmacist who all work together to provide you with the care you need, when you need it.   You may see any of the following providers on your designated Care Team at your next follow up:  Dr. Toribio CHERRIE Dr. Ezra Shuck Dr. Ria Commander Dr. Odis Brownie Greig Mosses, NP [place]affie Shed, [place] [facility] [place], [place] Beckey Coe, NP [place] Lee, NP Tinnie Redman, PharmD   Please be sure to bring in all your medications bottles to every appointment.   Need to Contact Us :  If you have any questions or concerns before your next appointment please send us  a message through [place] or call our office at [phone].    TO LEAVE A  MESSAGE FOR THE NURSE SELECT OPTION 2, PLEASE LEAVE A MESSAGE INCLUDING: YOUR NAME DATE OF BIRTH CALL BACK NUMBER REASON FOR CALL**this is important as we prioritize the call backs  YOU WILL RECEIVE A CALL BACK THE SAME DAY AS LONG AS YOU CALL BEFORE 4:00 PM

## 2023-07-18 NOTE — Telephone Encounter (Signed)
 Advanced Heart Failure Patient Advocate Encounter  Test billing for this patients current coverage (AARP MPD) returned the following pricing:  Jardiance  and Farxiga  are both $47 for 30 days, $141 for 90 day supply.  Rachel DEL, CPhT Rx Patient Advocate Phone: [phone]

## 2023-07-25 ENCOUNTER — Telehealth (HOSPITAL_COMMUNITY): Payer: Self-pay

## 2023-07-25 NOTE — Telephone Encounter (Addendum)
 Patient's family has declined treatment. Philicia Branch, CMA from patient's doctor's office aware.   Auth Submission: NO AUTH NEEDED Site of care: Site of care: [facility] Payer: Medicare A/B, AARP Medication & CPT/J Code(s) submitted: Feraheme (ferumoxytol) [id number] Diagnosis Code:  Route of submission (phone, fax, portal):  Phone # Fax # Auth type: Buy/Bill HB Units/visits requested: 510mg  x 1 dose Reference number:  Approval from: 07/25/23 to 02/04/24

## 2023-07-31 DIAGNOSIS — I2510 Atherosclerotic heart disease of native coronary artery without angina pectoris: Secondary | ICD-10-CM | POA: Diagnosis not present

## 2023-07-31 DIAGNOSIS — I340 Nonrheumatic mitral (valve) insufficiency: Secondary | ICD-10-CM | POA: Diagnosis not present

## 2023-07-31 DIAGNOSIS — R197 Diarrhea, unspecified: Secondary | ICD-10-CM | POA: Diagnosis not present

## 2023-07-31 DIAGNOSIS — I420 Dilated cardiomyopathy: Secondary | ICD-10-CM | POA: Diagnosis not present

## 2023-08-04 ENCOUNTER — Inpatient Hospital Stay (HOSPITAL_COMMUNITY): Admission: RE | Admit: 2023-08-04 | Source: Ambulatory Visit

## 2023-08-04 DIAGNOSIS — I2510 Atherosclerotic heart disease of native coronary artery without angina pectoris: Secondary | ICD-10-CM | POA: Diagnosis not present

## 2023-08-04 DIAGNOSIS — D631 Anemia in chronic kidney disease: Secondary | ICD-10-CM | POA: Diagnosis not present

## 2023-08-04 DIAGNOSIS — M1712 Unilateral primary osteoarthritis, left knee: Secondary | ICD-10-CM | POA: Diagnosis not present

## 2023-08-04 DIAGNOSIS — N1832 Chronic kidney disease, stage 3b: Secondary | ICD-10-CM | POA: Diagnosis not present

## 2023-08-04 DIAGNOSIS — I088 Other rheumatic multiple valve diseases: Secondary | ICD-10-CM | POA: Diagnosis not present

## 2023-08-04 DIAGNOSIS — I480 Paroxysmal atrial fibrillation: Secondary | ICD-10-CM | POA: Diagnosis not present

## 2023-08-04 DIAGNOSIS — E890 Postprocedural hypothyroidism: Secondary | ICD-10-CM | POA: Diagnosis not present

## 2023-08-04 DIAGNOSIS — I5023 Acute on chronic systolic (congestive) heart failure: Secondary | ICD-10-CM | POA: Diagnosis not present

## 2023-08-04 DIAGNOSIS — E559 Vitamin D deficiency, unspecified: Secondary | ICD-10-CM | POA: Diagnosis not present

## 2023-08-04 DIAGNOSIS — I252 Old myocardial infarction: Secondary | ICD-10-CM | POA: Diagnosis not present

## 2023-08-04 DIAGNOSIS — I272 Pulmonary hypertension, unspecified: Secondary | ICD-10-CM | POA: Diagnosis not present

## 2023-08-04 DIAGNOSIS — E785 Hyperlipidemia, unspecified: Secondary | ICD-10-CM | POA: Diagnosis not present

## 2023-08-05 ENCOUNTER — Telehealth: Payer: Self-pay | Admitting: Cardiovascular Disease

## 2023-08-05 DIAGNOSIS — I2510 Atherosclerotic heart disease of native coronary artery without angina pectoris: Secondary | ICD-10-CM | POA: Diagnosis not present

## 2023-08-05 DIAGNOSIS — I5023 Acute on chronic systolic (congestive) heart failure: Secondary | ICD-10-CM | POA: Diagnosis not present

## 2023-08-05 DIAGNOSIS — I252 Old myocardial infarction: Secondary | ICD-10-CM | POA: Diagnosis not present

## 2023-08-05 DIAGNOSIS — D631 Anemia in chronic kidney disease: Secondary | ICD-10-CM | POA: Diagnosis not present

## 2023-08-05 DIAGNOSIS — N1832 Chronic kidney disease, stage 3b: Secondary | ICD-10-CM | POA: Diagnosis not present

## 2023-08-05 DIAGNOSIS — M1712 Unilateral primary osteoarthritis, left knee: Secondary | ICD-10-CM | POA: Diagnosis not present

## 2023-08-05 NOTE — Telephone Encounter (Signed)
 Called [name] with [facility] left message on personal voice mail Dr.McAlhany ok with changing parameters to call if pulse less than 55.

## 2023-08-05 NOTE — Telephone Encounter (Signed)
 Spoke to [facility] PT with [facility] calling to report patient's pulse at rest 56.Stated patient feels fine.He stated their parameters are to call MD if pulse below 60.He would like Dr.to change parameters to call if pulse less than 55.I will send message to Dr.McAlhany for advice.

## 2023-08-05 NOTE — Telephone Encounter (Signed)
 STAT if HR is under 50 or over 120  (normal HR is 60-100 beats per minute)  What is your heart rate? Pt physical therapist called to report the pts HR is too low  Do you have a log of your heart rate readings (document readings)? 56 71 55 56  Do you have any other symptoms? No

## 2023-08-09 ENCOUNTER — Telehealth: Payer: Self-pay | Admitting: Cardiovascular Disease

## 2023-08-09 DIAGNOSIS — D631 Anemia in chronic kidney disease: Secondary | ICD-10-CM | POA: Diagnosis not present

## 2023-08-09 DIAGNOSIS — N1832 Chronic kidney disease, stage 3b: Secondary | ICD-10-CM | POA: Diagnosis not present

## 2023-08-09 DIAGNOSIS — M1712 Unilateral primary osteoarthritis, left knee: Secondary | ICD-10-CM | POA: Diagnosis not present

## 2023-08-09 DIAGNOSIS — I252 Old myocardial infarction: Secondary | ICD-10-CM | POA: Diagnosis not present

## 2023-08-09 DIAGNOSIS — I5023 Acute on chronic systolic (congestive) heart failure: Secondary | ICD-10-CM | POA: Diagnosis not present

## 2023-08-09 DIAGNOSIS — I2510 Atherosclerotic heart disease of native coronary artery without angina pectoris: Secondary | ICD-10-CM | POA: Diagnosis not present

## 2023-08-09 NOTE — Telephone Encounter (Signed)
 Calling to see if there is any restriction for the patient after having several stint put in. Patient HR was being around 46. Please advise

## 2023-08-09 NOTE — Telephone Encounter (Signed)
 Returned call to [employer] with [employer].Left message to call back.

## 2023-08-09 NOTE — Telephone Encounter (Signed)
 Spoke to [name] with [name].Stated patient has gained 10 lbs since 7/31.No swelling in lower legs.She fell in kitchen this past Saturday.Right leg gave way.She went to ED no injuries.She is eating salty snacks.He advised her of low salt diet. Her heart rate this morning 46.He will need new heart rate parameters.  Spoke to daughter Landry she stated mother is taking Lasix  40 mg daily.Advised to double Lasix  dose the next 3 mornings then return to normal dose.Appointment scheduled with Dr.McAlhany 8/8 at 8:40 am.  I will send message to Dr.McAlhany.

## 2023-08-11 DIAGNOSIS — I5023 Acute on chronic systolic (congestive) heart failure: Secondary | ICD-10-CM | POA: Diagnosis not present

## 2023-08-11 DIAGNOSIS — N1832 Chronic kidney disease, stage 3b: Secondary | ICD-10-CM | POA: Diagnosis not present

## 2023-08-11 DIAGNOSIS — M1712 Unilateral primary osteoarthritis, left knee: Secondary | ICD-10-CM | POA: Diagnosis not present

## 2023-08-11 DIAGNOSIS — I252 Old myocardial infarction: Secondary | ICD-10-CM | POA: Diagnosis not present

## 2023-08-11 DIAGNOSIS — D631 Anemia in chronic kidney disease: Secondary | ICD-10-CM | POA: Diagnosis not present

## 2023-08-11 DIAGNOSIS — I2510 Atherosclerotic heart disease of native coronary artery without angina pectoris: Secondary | ICD-10-CM | POA: Diagnosis not present

## 2023-08-12 ENCOUNTER — Ambulatory Visit: Attending: Cardiovascular Disease | Admitting: Cardiovascular Disease

## 2023-08-12 ENCOUNTER — Encounter: Payer: Self-pay | Admitting: Cardiovascular Disease

## 2023-08-12 ENCOUNTER — Other Ambulatory Visit (HOSPITAL_COMMUNITY): Payer: Self-pay

## 2023-08-12 VITALS — BP 106/60 | HR 60 | Ht 60.0 in | Wt 142.0 lb

## 2023-08-12 DIAGNOSIS — I779 Disorder of arteries and arterioles, unspecified: Secondary | ICD-10-CM | POA: Diagnosis not present

## 2023-08-12 DIAGNOSIS — Z952 Presence of prosthetic heart valve: Secondary | ICD-10-CM | POA: Insufficient documentation

## 2023-08-12 DIAGNOSIS — I2510 Atherosclerotic heart disease of native coronary artery without angina pectoris: Secondary | ICD-10-CM | POA: Insufficient documentation

## 2023-08-12 DIAGNOSIS — I480 Paroxysmal atrial fibrillation: Secondary | ICD-10-CM | POA: Insufficient documentation

## 2023-08-12 DIAGNOSIS — I5022 Chronic systolic (congestive) heart failure: Secondary | ICD-10-CM | POA: Diagnosis not present

## 2023-08-12 NOTE — Progress Notes (Signed)
 Chief Complaint  Patient presents with   Follow-up    CAD, aortic valve disease   History of Present Illness: 89 yo female with history of CAD, HLD, SVT, paroxysmal atrial fibrillation, breast cancer, chronic diastolic CHF, bilateral carotid artery disease and severe aortic valve stenosis s/p TAVR who is here today for cardiac follow up.  Cardiac cath in July 2017 with mild plaque in the LAD. She underwent TAVR on 07/27/16 with placement of a 26 mm Edwards Sapien 3 bioprosthetic AVR from the right femoral artery. Post operative echo showed a normally functioning AVR. She had been followed in the past for her CAD and AS by Dr. Shlomo. She was seen in our office September 2020 by Glendia Ferrier, PA-C with volume overload that responded quickly to Lasix  therapy. Echo September 2020 with LVEF=55-60%, mild LVH, normally functioning bioprosthetic AVR with mean gradient 17 mmHg (13 mmHg post immediately post implant), trace MR. Carotid artery dopplers November 2022 with mild to moderate bilateral carotid artery disease. I saw her in October 2023 and she c/o palpitations and weight gain. Echo 11/13/21 with LVEF=55-60%. AVR working well.  Cardiac monitor November 2023 with runs of SVT. She was admitted to [facility] December  2023 with atrial fib with RVR and underwent TEE guided cardioversion and was discharged on Cardizem  and Eliquis  but had an allergic reaction and was changed to atenolol  and Xarelto . She was seen in our office 03/16/23 by Dr. Wendel for dizziness and report of heart rate in the 40s on her home smart watch. Heart rate 43 bpm in our office. Atenolol  was stopped. TSH normal. Cardiac monitor March 2025 with with rare premature beats (PACs/PVCs). No heart block noted. I saw her in the office 03/25/23 and she was feeling better but noted LE edema since cutting back on her Lasix . Her Lasix  was increaesed to 40 mg BID. Admitted with anterolateral STEMI in June 2025 and found to have 99% proximal LAD stenosis  treated with a drug eluting stent. She developed cardiogenic shock post MI and was managed with norepinephrine  and milrinone . Echo with LVEF=40-45%. She had cardiac arrest with VT/VF requiring CPR. Readmitted 07/01/23 with upper GI bleeding. No recurrent episodes while admitted. ASA and Xarelto  were stopped She was discharged on Plavix .   She is here today for follow up. The patient denies any chest pain, dyspnea, palpitations, lower extremity edema, orthopnea, PND, dizziness, near syncope or syncope. She is feeling great. Wt is stable at home.     Primary Care Physician: Auston Opal, DO  Past Medical History:  Diagnosis Date   Arthritis    hands, knees   CAD (coronary artery disease), native coronary artery cardiologist--- dr verlin   a. mild non obst CAD per cath 07-18-2015   Carotid artery disease (HCC)    Carotid US  09/2018: Bilateral ICA 40-59 // Carotid US  11/21: Bilateral ICA 40-59; repeat 1 year    Chronic diastolic CHF (congestive heart failure) (HCC)    followed by cardiology   CKD (chronic kidney disease), stage III (HCC)    followed by pcp   Dyspnea    per pt when she walks which is usual for her   History of gastric ulcer    remote yrs ago   History of rheumatic fever as a child    History of right breast cancer 2006   s/p right partial mastectomy w/ node dissection's,  completed chemo/ radiation same year  (05-21-2020 pt stated no recurrence)   Hyperlipidemia  Hypothyroidism    followed by pcp   Osteoporosis    PMB (postmenopausal bleeding)    PSVT (paroxysmal supraventricular tachycardia) (HCC) 10/2012   in ED resolved w/ vagal maneuver    S/P aortic valve replacement 07/27/2016   for severe stenosis;  last echo in epic 09-22-2018 ef 55-60%, mild TR, aortic valve area 1.4cm^2 and mean gradiant 17 mmHg   Seasonal allergies    Vitamin D  deficiency     Past Surgical History:  Procedure Laterality Date   CARDIAC CATHETERIZATION N/A 07/18/2015   Procedure:  Right/Left Heart Cath and Coronary Angiography;  Surgeon: Lonni JONETTA Cash, MD;  Location: [facility] INVASIVE CV LAB;  Service: Cardiovascular;  Laterality: N/A;   CARDIOVERSION N/A 01/01/2022   Procedure: CARDIOVERSION;  Surgeon: Hobart Powell BRAVO, MD;  Location: [facility] ENDOSCOPY;  Service: Cardiovascular;  Laterality: N/A;   CARDIOVERSION N/A 09/10/2022   Procedure: CARDIOVERSION;  Surgeon: Delford Maude BROCKS, MD;  Location: [facility] INVASIVE CV LAB;  Service: Cardiovascular;  Laterality: N/A;   CATARACT EXTRACTION W/ INTRAOCULAR LENS  IMPLANT, BILATERAL     2017   CHOLECYSTECTOMY N/A 08/06/2014   Procedure: LAPAROSCOPIC CHOLECYSTECTOMY ;  Surgeon: Donnice Bury, MD;  Location: [facility] OR;  Service: General;  Laterality: N/A;   CORONARY/GRAFT ACUTE MI REVASCULARIZATION N/A 06/19/2023   Procedure: Coronary/Graft Acute MI Revascularization;  Surgeon: Elmira Newman PARAS, MD;  Location: [facility] INVASIVE CV LAB;  Service: Cardiovascular;  Laterality: N/A;   HYSTEROSCOPY WITH D & C N/A 05/26/2020   Procedure: DILATATION AND CURETTAGE /HYSTEROSCOPY;  Surgeon: Jannis Kate Norris, MD;  Location: [facility] [place];  Service: Gynecology;  Laterality: N/A;  Request case for [facility]   LEFT HEART CATH AND CORONARY ANGIOGRAPHY N/A 06/19/2023   Procedure: LEFT HEART CATH AND CORONARY ANGIOGRAPHY;  Surgeon: Elmira Newman PARAS, MD;  Location: [facility] INVASIVE CV LAB;  Service: Cardiovascular;  Laterality: N/A;   PARTIAL MASTECTOMY WITH AXILLARY SENTINEL LYMPH NODE BIOPSY Right 2006   RIGHT HEART CATH N/A 06/19/2023   Procedure: RIGHT HEART CATH;  Surgeon: Elmira Newman PARAS, MD;  Location: [facility] INVASIVE CV LAB;  Service: Cardiovascular;  Laterality: N/A;   TEE WITHOUT CARDIOVERSION N/A 07/27/2016   Procedure: TRANSESOPHAGEAL ECHOCARDIOGRAM (TEE);  Surgeon: Cash Lonni JONETTA, MD;  Location: [facility] OR;  Service: Open Heart Surgery;  Laterality: N/A;   TEE WITHOUT CARDIOVERSION N/A 01/01/2022   Procedure: TRANSESOPHAGEAL  ECHOCARDIOGRAM (TEE);  Surgeon: Hobart Powell BRAVO, MD;  Location[facility] ENDOSCOPY;  Service: Cardiovascular;  Laterality: N/A;   THYROID  SURGERY  1969   NODULE REMOVED, benign per pt   TONSILLECTOMY  age 29   TOTAL HIP ARTHROPLASTY Left 2003   TOTAL HIP ARTHROPLASTY Right 08/28/2013   Procedure: RIGHT TOTAL HIP ARTHROPLASTY ANTERIOR APPROACH;  Surgeon: Donnice JONETTA Car, MD;  Location: [facility] ORS;  Service: Orthopedics;  Laterality: Right;   TOTAL KNEE ARTHROPLASTY Right 01-16-2007  @[facility]    TRANSCATHETER AORTIC VALVE REPLACEMENT, TRANSFEMORAL N/A 07/27/2016   Procedure: TRANSCATHETER AORTIC VALVE REPLACEMENT, TRANSFEMORAL;  Surgeon: Cash Lonni JONETTA, MD;  Location: [facility] OR;  Service: Open Heart Surgery;  Laterality: N/A;    Current Outpatient Medications  Medication Sig Dispense Refill   acetaminophen  (TYLENOL ) 500 MG tablet Take 1 tablet (500 mg total) by mouth every 8 (eight) hours as needed for mild pain (pain score 1-3) or moderate pain (pain score 4-6).     ALPRAZolam (NIRAVAM) 0.25 MG dissolvable tablet Take 0.25 mg by mouth at bedtime as needed for anxiety.     amiodarone  (PACERONE ) 200  MG tablet Take 1 tablet (200 mg total) by mouth daily.     clopidogrel  (PLAVIX ) 75 MG tablet Take 1 tablet (75 mg total) by mouth daily.     docusate sodium  (COLACE) 100 MG capsule Take 2 capsules (200 mg total) by mouth 2 (two) times daily.     empagliflozin  (JARDIANCE ) 10 MG TABS tablet Take 1 tablet (10 mg total) by mouth daily before breakfast. 90 tablet 3   furosemide  (LASIX ) 40 MG tablet Take 1 tablet (40 mg total) by mouth daily.     hydrocortisone  (ANUSOL -HC) 2.5 % rectal cream Place rectally 3 (three) times daily.     levothyroxine  (SYNTHROID ) 125 MCG tablet Take 125 mcg by mouth daily.     Magnesium  400 MG CAPS Take 400 mg by mouth in the morning and at bedtime.     mexiletine (MEXITIL ) 150 MG capsule Take 1 capsule (150 mg total) by mouth every 8 (eight) hours.     pantoprazole  (PROTONIX ) 40 MG  tablet Take 1 tablet (40 mg total) by mouth daily.     polyethylene glycol (MIRALAX  / GLYCOLAX ) 17 g packet Take 17 g by mouth daily.     spironolactone  (ALDACTONE ) 25 MG tablet Take 1 tablet (25 mg total) by mouth daily.     vitamin C  (ASCORBIC ACID ) 500 MG tablet Take 500 mg by mouth daily.     No current facility-administered medications for this visit.    Allergies  Allergen Reactions   Compazine [Prochlorperazine] Other (See Comments)    Unknown, pt does not remember   Fenofibrate Rash   Floxin [Ofloxacin] Other (See Comments)    Unknown, pt does not remember reaction but was allergic reaction   Statins Other (See Comments)    Myalgia - Atorvastatin, simvastatin, pitavastatin    Social History   Socioeconomic History   Marital status: Widowed    Spouse name: Not on file   Number of children: Not on file   Years of education: Not on file   Highest education level: Not on file  Occupational History   Not on file  Tobacco Use   Smoking status: Never   Smokeless tobacco: Never   Tobacco comments:    Never smoked 08/24/22  Vaping Use   Vaping status: Never Used  Substance and Sexual Activity   Alcohol use: No   Drug use: Never   Sexual activity: Not Currently    Birth control/protection: Post-menopausal  Other Topics Concern   Not on file  Social History Narrative   Not on file   Social Drivers of Health   Financial Resource Strain: Not on file  Food Insecurity: No Food Insecurity (07/02/2023)   Hunger Vital Sign    Worried About Running Out of Food in the Last Year: Never true    Ran Out of Food in the Last Year: Never true  Transportation Needs: No Transportation Needs (07/02/2023)   PRAPARE - Administrator, Civil Service (Medical): No    Lack of Transportation (Non-Medical): No  Physical Activity: Not on file  Stress: Not on file  Social Connections: Moderately Isolated (07/02/2023)   Social Connection and Isolation Panel    Frequency of  Communication with Friends and Family: Three times a week    Frequency of Social Gatherings with Friends and Family: Three times a week    Attends Religious Services: More than 4 times per year    Active Member of Clubs or Organizations: No    Attends Club or  Organization Meetings: Never    Marital Status: Widowed  Intimate Partner Violence: Not At Risk (07/02/2023)   Humiliation, Afraid, Rape, and Kick questionnaire    Fear of Current or Ex-Partner: No    Emotionally Abused: No    Physically Abused: No    Sexually Abused: No    Family History  Problem Relation Age of Onset   Heart disease Mother    Heart disease Father     Review of Systems:  As stated in the HPI and otherwise negative.   BP 106/60   Pulse 60   Ht 5' (1.524 m)   Wt 142 lb (64.4 kg)   LMP  (LMP Unknown)   SpO2 93%   BMI 27.73 kg/m   Physical Examination:  General: Well developed, well nourished, NAD  HEENT: OP clear, mucus membranes moist  SKIN: warm, dry. No rashes. Neuro: No focal deficits  Musculoskeletal: Muscle strength 5/5 all ext  Psychiatric: Mood and affect normal  Neck: No JVD, no carotid bruits, no thyromegaly, no lymphadenopathy.  Lungs:Clear bilaterally, no wheezes, rhonci, crackles Cardiovascular: Regular rate and rhythm. No murmurs, gallops or rubs. Abdomen:Soft. Bowel sounds present. Non-tender.  Extremities: No lower extremity edema. Pulses are 2 + in the bilateral DP/PT.  EKG:  EKG is not ordered today. The ekg ordered today demonstrates   Recent Labs: 11/09/2022: NT-Pro BNP 554 07/01/2023: B Natriuretic Peptide 4,445.8 07/02/2023: ALT 34 07/04/2023: Magnesium  2.0; TSH 1.850 07/18/2023: BUN 14; Creatinine, Ser 1.46; Hemoglobin 12.4; Platelets 325; Potassium 3.5; Sodium 137   Lipid Panel    Component Value Date/Time   CHOL 171 06/20/2023 0139   TRIG 54 06/20/2023 0139   HDL 52 06/20/2023 0139   CHOLHDL 3.3 06/20/2023 0139   VLDL 11 06/20/2023 0139   LDLCALC 108 (H) 06/20/2023  0139     Wt Readings from Last 3 Encounters:  08/12/23 142 lb (64.4 kg)  07/18/23 153 lb (69.4 kg)  07/04/23 166 lb 10.7 oz (75.6 kg)    Assessment and Plan:   1. Severe aortic valve stenosis: She underwent TAVR with placement of a 26 mm Sapien 3 valve from the right transfemoral approach in July 2018. Her valve was working well by echo in June 2025. She remains on Plavix . Continue SBE prophylaxis as indicated  2. CAD without angina: Recent anterior STEMI secondary to severe proximal LAD stenosis. One DES placed in the proximal LAD. No chest pain. Continue Plavix .   3. Atrial fibrillation, paroxysmal: Sinus today. No longer on Xarelto  due to recent possible GI bleeding. She does not wish to restart Xarelto . Continue amiodarone .   4. Ischemic cardiomyopathy/Chronic systolic and diastolic CHF: Volume status is ok today. Continue Lasix  40 mg daily. Continue aldactone .   5. Carotid artery disease: Mild to moderate bilateral carotid artery disease by dopplers November 2022. Given advanced age, will not repeat dopplers  Labs/ tests ordered today include:   No orders of the defined types were placed in this encounter.  Disposition:   F/U with me in 6 months  Signed, Lonni Cash, MD 08/12/2023 9:27 AM    [facility] [address] [place], [place], [place]  [address] Phone: [phone]; Fax: [phone]

## 2023-08-12 NOTE — Patient Instructions (Signed)
 Medication Instructions:  No medication changes were made at this visit. Continue current regimen.   Follow-Up: At [facility], you and your health needs are our priority.  As part of our continuing mission to provide you with exceptional heart care, our providers are all part of one team.  This team includes your primary Cardiologist (physician) and Advanced Practice Providers or APPs (Physician Assistants and Nurse Practitioners) who all work together to provide you with the care you need, when you need it.  Your next appointment:   6 month(s)  Provider:   Lonni Cash, MD

## 2023-08-16 DIAGNOSIS — M1712 Unilateral primary osteoarthritis, left knee: Secondary | ICD-10-CM | POA: Diagnosis not present

## 2023-08-16 DIAGNOSIS — I5023 Acute on chronic systolic (congestive) heart failure: Secondary | ICD-10-CM | POA: Diagnosis not present

## 2023-08-16 DIAGNOSIS — I252 Old myocardial infarction: Secondary | ICD-10-CM | POA: Diagnosis not present

## 2023-08-16 DIAGNOSIS — I2510 Atherosclerotic heart disease of native coronary artery without angina pectoris: Secondary | ICD-10-CM | POA: Diagnosis not present

## 2023-08-16 DIAGNOSIS — D631 Anemia in chronic kidney disease: Secondary | ICD-10-CM | POA: Diagnosis not present

## 2023-08-16 DIAGNOSIS — N1832 Chronic kidney disease, stage 3b: Secondary | ICD-10-CM | POA: Diagnosis not present

## 2023-08-18 DIAGNOSIS — I5023 Acute on chronic systolic (congestive) heart failure: Secondary | ICD-10-CM | POA: Diagnosis not present

## 2023-08-18 DIAGNOSIS — I2510 Atherosclerotic heart disease of native coronary artery without angina pectoris: Secondary | ICD-10-CM | POA: Diagnosis not present

## 2023-08-18 DIAGNOSIS — M1712 Unilateral primary osteoarthritis, left knee: Secondary | ICD-10-CM | POA: Diagnosis not present

## 2023-08-18 DIAGNOSIS — D631 Anemia in chronic kidney disease: Secondary | ICD-10-CM | POA: Diagnosis not present

## 2023-08-18 DIAGNOSIS — I252 Old myocardial infarction: Secondary | ICD-10-CM | POA: Diagnosis not present

## 2023-08-18 DIAGNOSIS — N1832 Chronic kidney disease, stage 3b: Secondary | ICD-10-CM | POA: Diagnosis not present

## 2023-08-23 DIAGNOSIS — I2510 Atherosclerotic heart disease of native coronary artery without angina pectoris: Secondary | ICD-10-CM | POA: Diagnosis not present

## 2023-08-23 DIAGNOSIS — N1832 Chronic kidney disease, stage 3b: Secondary | ICD-10-CM | POA: Diagnosis not present

## 2023-08-23 DIAGNOSIS — N183 Chronic kidney disease, stage 3 unspecified: Secondary | ICD-10-CM | POA: Diagnosis not present

## 2023-08-23 DIAGNOSIS — I4821 Permanent atrial fibrillation: Secondary | ICD-10-CM | POA: Diagnosis not present

## 2023-08-23 DIAGNOSIS — I10 Essential (primary) hypertension: Secondary | ICD-10-CM | POA: Diagnosis not present

## 2023-08-23 DIAGNOSIS — D631 Anemia in chronic kidney disease: Secondary | ICD-10-CM | POA: Diagnosis not present

## 2023-08-23 DIAGNOSIS — I480 Paroxysmal atrial fibrillation: Secondary | ICD-10-CM | POA: Diagnosis not present

## 2023-08-23 DIAGNOSIS — I252 Old myocardial infarction: Secondary | ICD-10-CM | POA: Diagnosis not present

## 2023-08-23 DIAGNOSIS — I5023 Acute on chronic systolic (congestive) heart failure: Secondary | ICD-10-CM | POA: Diagnosis not present

## 2023-08-23 DIAGNOSIS — M1712 Unilateral primary osteoarthritis, left knee: Secondary | ICD-10-CM | POA: Diagnosis not present

## 2023-08-24 ENCOUNTER — Other Ambulatory Visit (HOSPITAL_COMMUNITY): Payer: Self-pay

## 2023-08-24 ENCOUNTER — Other Ambulatory Visit: Payer: Self-pay

## 2023-08-24 MED ORDER — EMPAGLIFLOZIN 10 MG PO TABS
10.0000 mg | ORAL_TABLET | Freq: Every day | ORAL | 3 refills | Status: DC
  Filled 2023-11-13: qty 90, 90d supply, fill #0
  Filled ????-??-??: fill #0

## 2023-08-24 MED ORDER — LEVOTHYROXINE SODIUM 125 MCG PO TABS: 125.0000 ug | ORAL_TABLET | Freq: Every day | ORAL | 0 refills | Status: DC

## 2023-08-24 MED ORDER — MEXILETINE HCL 150 MG PO CAPS
150.0000 mg | ORAL_CAPSULE | Freq: Three times a day (TID) | ORAL | 3 refills | Status: AC
  Filled 2023-11-13 – 2023-11-15 (×2): qty 270, 90d supply, fill #0
  Filled 2024-01-20 – 2024-01-23 (×2): qty 270, 90d supply, fill #1

## 2023-08-24 MED ORDER — PANTOPRAZOLE SODIUM 40 MG PO TBEC: 40.0000 mg | DELAYED_RELEASE_TABLET | Freq: Every day | ORAL | 0 refills | Status: DC

## 2023-08-24 MED ORDER — SPIRONOLACTONE 25 MG PO TABS
25.0000 mg | ORAL_TABLET | Freq: Every day | ORAL | 3 refills | Status: AC
  Filled 2023-11-13 – 2023-11-15 (×2): qty 90, 90d supply, fill #0
  Filled 2024-01-20 – 2024-01-23 (×2): qty 90, 90d supply, fill #1

## 2023-08-24 MED ORDER — CLOPIDOGREL BISULFATE 75 MG PO TABS: 75.0000 mg | ORAL_TABLET | Freq: Every day | ORAL | Status: DC

## 2023-08-24 MED ORDER — FUROSEMIDE 40 MG PO TABS
40.0000 mg | ORAL_TABLET | Freq: Every day | ORAL | 3 refills | Status: AC
  Filled 2023-11-15: qty 90, 90d supply, fill #0
  Filled 2024-01-20 – 2024-01-23 (×2): qty 90, 90d supply, fill #1
  Filled ????-??-??: fill #0

## 2023-08-24 NOTE — Progress Notes (Signed)
 Received request to refill Lasix , Jardiance , Mexiletine, Spironolactone , Plavix , Protonix , Levothyroxin 125 mcg. Recent lab results 07/18/23. Will send message to Dr. Verlin.  Component Ref Range & Units (hover) 1 mo ago (07/18/23) 1 mo ago (07/04/23) 1 mo ago (07/03/23) 1 mo ago (07/02/23) 1 mo ago (07/01/23) 1 mo ago (06/28/23) 1 mo ago (06/27/23)  Sodium 137 128 Low  132 Low  131 Low  129 Low  133 Low  132 Low   Potassium 3.5 3.8 4.1 4.7 5.3 High  4.1 3.8  Chloride 96 Low  94 Low  97 Low  98 98 98 93 Low   CO2 30 23 23 25 20  Low  23 27  Glucose, Bld 104 High  83 CM 92 CM 94 CM 127 High  CM 102 High  CM 101 High  CM  Comment: Glucose reference range applies only to samples taken after fasting for at least 8 hours.  BUN 14 29 High  35 High  40 High  41 High  37 High  36 High   Creatinine, Ser 1.46 High  1.56 High  1.67 High  1.91 High  1.97 High  1.71 High  1.63 High   Calcium  9.6 8.5 Low  8.8 Low  8.8 Low  8.6 Low  8.7 Low  9.2  GFR, Estimated 34 Low  32 Low  CM 29 Low  CM 25 Low  CM 24 Low  CM 28 Low  CM 30 Low  CM  Comment: (NOTE) Calculated using the CKD-EPI Creatinine Equation (2021)  Anion gap 11 11 CM 12 CM 8 CM 11 CM 12 CM 12 CM  Comment: Performed at [facility], [address], [place], [place] [address]  Resulting Agency [facility] CL[facility]        Specimen Collected: 07/18/23 10:52 Last Resulted: 07/18/23 12:21

## 2023-08-26 ENCOUNTER — Telehealth (HOSPITAL_COMMUNITY): Payer: Self-pay

## 2023-08-26 DIAGNOSIS — N1832 Chronic kidney disease, stage 3b: Secondary | ICD-10-CM | POA: Diagnosis not present

## 2023-08-26 DIAGNOSIS — I252 Old myocardial infarction: Secondary | ICD-10-CM | POA: Diagnosis not present

## 2023-08-26 DIAGNOSIS — D631 Anemia in chronic kidney disease: Secondary | ICD-10-CM | POA: Diagnosis not present

## 2023-08-26 DIAGNOSIS — I5023 Acute on chronic systolic (congestive) heart failure: Secondary | ICD-10-CM | POA: Diagnosis not present

## 2023-08-26 DIAGNOSIS — I2510 Atherosclerotic heart disease of native coronary artery without angina pectoris: Secondary | ICD-10-CM | POA: Diagnosis not present

## 2023-08-26 DIAGNOSIS — M1712 Unilateral primary osteoarthritis, left knee: Secondary | ICD-10-CM | POA: Diagnosis not present

## 2023-08-26 NOTE — Telephone Encounter (Signed)
 Called to confirm/remind patient of their appointment at the [facility] on 08/29/23.   Appointment:   [] Confirmed  [x] Left mess   [] No answer/No voice mail  [] VM Full/unable to leave message  [] Phone not in service  And to bring in all medications and/or complete list.

## 2023-08-29 ENCOUNTER — Encounter (HOSPITAL_COMMUNITY): Payer: Self-pay

## 2023-08-29 ENCOUNTER — Ambulatory Visit (HOSPITAL_COMMUNITY)
Admission: RE | Admit: 2023-08-29 | Discharge: 2023-08-29 | Disposition: A | Discharge status: Home / Self Care | Source: Ambulatory Visit | Attending: Adult Health | Admitting: Adult Health

## 2023-08-29 VITALS — BP 132/72 | HR 62 | Ht 60.0 in | Wt 149.0 lb

## 2023-08-29 DIAGNOSIS — I252 Old myocardial infarction: Secondary | ICD-10-CM | POA: Diagnosis not present

## 2023-08-29 DIAGNOSIS — I471 Supraventricular tachycardia, unspecified: Secondary | ICD-10-CM | POA: Insufficient documentation

## 2023-08-29 DIAGNOSIS — E785 Hyperlipidemia, unspecified: Secondary | ICD-10-CM | POA: Insufficient documentation

## 2023-08-29 DIAGNOSIS — I272 Pulmonary hypertension, unspecified: Secondary | ICD-10-CM | POA: Insufficient documentation

## 2023-08-29 DIAGNOSIS — N182 Chronic kidney disease, stage 2 (mild): Secondary | ICD-10-CM | POA: Insufficient documentation

## 2023-08-29 DIAGNOSIS — Z952 Presence of prosthetic heart valve: Secondary | ICD-10-CM | POA: Insufficient documentation

## 2023-08-29 DIAGNOSIS — I480 Paroxysmal atrial fibrillation: Secondary | ICD-10-CM | POA: Diagnosis not present

## 2023-08-29 DIAGNOSIS — I2510 Atherosclerotic heart disease of native coronary artery without angina pectoris: Secondary | ICD-10-CM | POA: Diagnosis not present

## 2023-08-29 DIAGNOSIS — I5022 Chronic systolic (congestive) heart failure: Secondary | ICD-10-CM | POA: Insufficient documentation

## 2023-08-29 DIAGNOSIS — Z955 Presence of coronary angioplasty implant and graft: Secondary | ICD-10-CM | POA: Insufficient documentation

## 2023-08-29 DIAGNOSIS — Z7902 Long term (current) use of antithrombotics/antiplatelets: Secondary | ICD-10-CM | POA: Diagnosis not present

## 2023-08-29 DIAGNOSIS — I255 Ischemic cardiomyopathy: Secondary | ICD-10-CM | POA: Insufficient documentation

## 2023-08-29 DIAGNOSIS — Z7984 Long term (current) use of oral hypoglycemic drugs: Secondary | ICD-10-CM | POA: Diagnosis not present

## 2023-08-29 DIAGNOSIS — I472 Ventricular tachycardia, unspecified: Secondary | ICD-10-CM | POA: Diagnosis not present

## 2023-08-29 NOTE — Patient Instructions (Addendum)
 Follow-Up in: 6 WEEKS WITH AN ECHOCARDIOGRAM WITH DR. CHERRIE  At the [facility], you and your health needs are our priority. We have a designated team specialized in the treatment of Heart Failure. This Care Team includes your primary Heart Failure Specialized Cardiologist (physician), Advanced Practice Providers (APPs- Physician Assistants and Nurse Practitioners), and Pharmacist who all work together to provide you with the care you need, when you need it.   You may see any of the following providers on your designated Care Team at your next follow up:  Dr. Toribio CHERRIE Dr. Ezra Shuck Dr. Ria Commander Dr. Odis Brownie Greig Mosses, NP Caffie Shed, [place] [facility] [place], [place] Beckey Coe, NP [place] Lee, NP Tinnie Redman, PharmD   Please be sure to bring in all your medications bottles to every appointment.   Need to Contact Us :  If you have any questions or concerns before your next appointment please send us  a message through [place] or call our office at [phone].    TO LEAVE A MESSAGE FOR THE NURSE SELECT OPTION 2, PLEASE LEAVE A MESSAGE INCLUDING: YOUR NAME DATE OF BIRTH CALL BACK NUMBER REASON FOR CALL**this is important as we prioritize the call backs  YOU WILL RECEIVE A CALL BACK THE SAME DAY AS LONG AS YOU CALL BEFORE 4:00 PM

## 2023-08-29 NOTE — Progress Notes (Signed)
 ADVANCED HF CLINIC NOTE  Referring Physician: Auston Opal, DO Primary Care: Auston Opal, DO Primary Cardiologist: Lonni Cash, MD Heart Failure Cardiologist: Toribio Fuel, MD  Chief Complaint: Heart Failure  HPI: [name] is a 89 y.o. female with 89 y.o. female with hyperlipidemia, SVT, PAF, severe AS s/p advert SAPIEN 3 bioprosthetic TAVR 2018, mild nonobstructive CAD previously.   Admitted 6/25 with NSTEMI.  ECG c/w anterolateral STEMI. Taken emergently to cath lab by Dr. Elmira. Cath with 99% pLAD with minimal flow distally. Lcx/RCA ok EF 25%  Treated with 3.0 X 20 mm Synergy drug-eluting stent. RHC RA 10 PA 74/25 (45) PCW 25 Fick 3.3/1.9 TD 2.9/1.7 on NE 7. LVEDP 15. Echo 6/16: EF 40-45%, G2DD, nl RV. During stay she was noted to have frequent runs of polymorphic NSVT and she was started on IV amiodarone . Unfortunately during the night a code blue was called for sustained VT>VF. Required 1 round of CPR with ROSC. Started on lidocaine . Rates down to 30s, amiodarone  briefly held. Again with reperfusion ectopy and amiodarone  restarted. Lidocaine  turned off d/t elevated level and amiodarone  transitioned to PO mexiletine at discharge. Discharged to SNF.  Admitted later that week after discharge with GIB. Family deferred scope. ASA and Xarelto  stopped. Discharged with clopidogrel . Plan for outpatient GI follow up. Discharged back to SNF.   Today she returns for HF follow up with her son. Last visit jardiance  started.Overall feeling fine. Continues to work with PT a few days a week. Denies SOB/PND/Orthopnea. No bleeding issues. SBP at home 100-110. Weight at home 141-144 pounds. Appetite ok. No fever or chills. Taking all medications. Lives alone but has a care giver 4 hours.    Past Medical History:  Diagnosis Date   Arthritis    hands, knees   CAD (coronary artery disease), native coronary artery cardiologist--- dr cash   a. mild non obst CAD per cath 07-18-2015    Carotid artery disease (HCC)    Carotid US  09/2018: Bilateral ICA 40-59 // Carotid US  11/21: Bilateral ICA 40-59; repeat 1 year    Chronic diastolic CHF (congestive heart failure) (HCC)    followed by cardiology   CKD (chronic kidney disease), stage III (HCC)    followed by pcp   Dyspnea    per pt when she walks which is usual for her   History of gastric ulcer    remote yrs ago   History of rheumatic fever as a child    History of right breast cancer 2006   s/p right partial mastectomy w/ node dissection's,  completed chemo/ radiation same year  (05-21-2020 pt stated no recurrence)   Hyperlipidemia    Hypothyroidism    followed by pcp   Osteoporosis    PMB (postmenopausal bleeding)    PSVT (paroxysmal supraventricular tachycardia) (HCC) 10/2012   in ED resolved w/ vagal maneuver    S/P aortic valve replacement 07/27/2016   for severe stenosis;  last echo in epic 09-22-2018 ef 55-60%, mild TR, aortic valve area 1.4cm^2 and mean gradiant 17 mmHg   Seasonal allergies    Vitamin D  deficiency     Current Outpatient Medications  Medication Sig Dispense Refill   ALPRAZolam (NIRAVAM) 0.25 MG dissolvable tablet Take 0.25 mg by mouth at bedtime as needed for anxiety.     amiodarone  (PACERONE ) 200 MG tablet Take 1 tablet (200 mg total) by mouth daily.     clopidogrel  (PLAVIX ) 75 MG tablet Take 1 tablet (75 mg total) by mouth daily.  empagliflozin  (JARDIANCE ) 10 MG TABS tablet Take 1 tablet (10 mg total) by mouth daily before breakfast. 90 tablet 3   furosemide  (LASIX ) 40 MG tablet Take 1 tablet (40 mg total) by mouth daily. 90 tablet 3   levothyroxine  (SYNTHROID ) 125 MCG tablet Take 1 tablet (125 mcg total) by mouth daily. 90 tablet 0   mexiletine (MEXITIL ) 150 MG capsule Take 1 capsule (150 mg total) by mouth every 8 (eight) hours. 270 capsule 3   pantoprazole  (PROTONIX ) 40 MG tablet Take 1 tablet (40 mg total) by mouth daily. Please send future refills to PCP [facility] 90 tablet 0    spironolactone  (ALDACTONE ) 25 MG tablet Take 1 tablet (25 mg total) by mouth daily. 90 tablet 3   acetaminophen  (TYLENOL ) 500 MG tablet Take 1 tablet (500 mg total) by mouth every 8 (eight) hours as needed for mild pain (pain score 1-3) or moderate pain (pain score 4-6).     docusate sodium  (COLACE) 100 MG capsule Take 2 capsules (200 mg total) by mouth 2 (two) times daily.     Magnesium  400 MG CAPS Take 400 mg by mouth in the morning and at bedtime.     polyethylene glycol (MIRALAX  / GLYCOLAX ) 17 g packet Take 17 g by mouth daily.     vitamin C  (ASCORBIC ACID ) 500 MG tablet Take 500 mg by mouth daily.     No current facility-administered medications for this encounter.   Allergies  Allergen Reactions   Compazine [Prochlorperazine] Other (See Comments)    Unknown, pt does not remember   Fenofibrate Rash   Floxin [Ofloxacin] Other (See Comments)    Unknown, pt does not remember reaction but was allergic reaction   Statins Other (See Comments)    Myalgia - Atorvastatin, simvastatin, pitavastatin   Social History   Socioeconomic History   Marital status: Widowed    Spouse name: Not on file   Number of children: Not on file   Years of education: Not on file   Highest education level: Not on file  Occupational History   Not on file  Tobacco Use   Smoking status: Never   Smokeless tobacco: Never   Tobacco comments:    Never smoked 08/24/22  Vaping Use   Vaping status: Never Used  Substance and Sexual Activity   Alcohol use: No   Drug use: Never   Sexual activity: Not Currently    Birth control/protection: Post-menopausal  Other Topics Concern   Not on file  Social History Narrative   Not on file   Social Drivers of Health   Financial Resource Strain: Not on file  Food Insecurity: No Food Insecurity (07/02/2023)   Hunger Vital Sign    Worried About Running Out of Food in the Last Year: Never true    Ran Out of Food in the Last Year: Never true  Transportation Needs: No  Transportation Needs (07/02/2023)   PRAPARE - Administrator, Civil Service (Medical): No    Lack of Transportation (Non-Medical): No  Physical Activity: Not on file  Stress: Not on file  Social Connections: Moderately Isolated (07/02/2023)   Social Connection and Isolation Panel    Frequency of Communication with Friends and Family: Three times a week    Frequency of Social Gatherings with Friends and Family: Three times a week    Attends Religious Services: More than 4 times per year    Active Member of Clubs or Organizations: No    Attends Club or  Organization Meetings: Never    Marital Status: Widowed  Intimate Partner Violence: Not At Risk (07/02/2023)   Humiliation, Afraid, Rape, and Kick questionnaire    Fear of Current or Ex-Partner: No    Emotionally Abused: No    Physically Abused: No    Sexually Abused: No    Family History  Problem Relation Age of Onset   Heart disease Mother    Heart disease Father    Vitals:   08/29/23 1025  BP: 132/72  Pulse: 62  SpO2: 94%  Weight: 67.6 kg (149 lb)  Height: 5' (1.524 m)   Wt Readings from Last 3 Encounters:  08/29/23 67.6 kg (149 lb)  08/12/23 64.4 kg (142 lb)  07/18/23 69.4 kg (153 lb)    PHYSICAL EXAM: General:   No resp difficulty. Arrived in a wheel chair.  Neck: no JVD.  Cor: Regular rate & rhythm.  Lungs: clear Abdomen: soft, nontender, nondistended.  Extremities: no  edema Neuro: alert & oriented x3  ASSESSMENT & PLAN: CAD - Cath 06/19/23: 99% pLAD with minimal flow distally s/p DES. Lcx/RCA ok EF 25%  - Continue Plavix .  - Off Xarelto  with GIB. Denies bleeding issues.  - C/b post MI VT, due to reperfusion, improved -No Chest pain.  - Intolerant statins due to myalgias.    Chronic Systolic Heart Failure - Diagnosed 6/25 - Ischemic CM: EF ~25% on cath (EF previously 55-60% in 11/23) - RHC 6/15: RA 10 PA 74/25 (45) PCW 25 Fick 3.3/1.9 TD 2.9/1.7 on NE 7 in cath lab   - Echo 6/16: EF 40-45%,  G2DD, nl RV -Limited Echo 07/03/2023 LVEF 35-40% Severe MR RV moderately reduced.  -NYHA II.  -Appears euvolemic. Continue lasix  40 mg daily.  -Continue Jardiance  10 mg daily.  - Continue spironolactone  25mg  daily  - Hold off on arb with SBP 100s.  - Repeat Echo in 6 weeks.     VT/VF NSVT - 6/16 developed persistent, long runs of NSVT 5/16 on tele. Started on Amio and Lidocaine . Lytes replaced - 6/17 overnight sustained VT/VF with lost pulse, received 1 round CPR + 300 mg amio bolus with ROSC and conversion to NSR. - Keep K >4 and Mg >2 - continue mexiletine 150 mg tid - Continue amio 200 mg daily - Patient is on amiodarone .  - Plan to follow amiodarone  screening per guidelines  - Will need yearly CXR, TSH Free T3 Free T4, LFTS, and eye exams.  - Discussed with patient.   Pulmonary HTN - mixed picture (both pre and post-capillary) - HF treatment as above   H/o AS s/p TAVR 2018 - mild AI and AS. Stable valve. AVA 0.88cm2.    PAF - Off AC with GIB. Regular on exam.  - continue amio and Mexiletine   CKD2 - Continue spironolactone  - Continue jardiance    GIB - ASA and Xarelto  stopped during recent hospitalization.   Severe MR  Repeat Echo  in 6 weeks.      Follow up in 6 weeks with an ECHO and Dr Cherrie.   Janice Mosses NP-C   Advanced Heart Failure Clinic [facility] [address] [facility] [place] [address] [phone] (office)

## 2023-08-30 DIAGNOSIS — D631 Anemia in chronic kidney disease: Secondary | ICD-10-CM | POA: Diagnosis not present

## 2023-08-30 DIAGNOSIS — N1832 Chronic kidney disease, stage 3b: Secondary | ICD-10-CM | POA: Diagnosis not present

## 2023-08-30 DIAGNOSIS — I5023 Acute on chronic systolic (congestive) heart failure: Secondary | ICD-10-CM | POA: Diagnosis not present

## 2023-08-30 DIAGNOSIS — M1712 Unilateral primary osteoarthritis, left knee: Secondary | ICD-10-CM | POA: Diagnosis not present

## 2023-08-30 DIAGNOSIS — I252 Old myocardial infarction: Secondary | ICD-10-CM | POA: Diagnosis not present

## 2023-08-30 DIAGNOSIS — I2510 Atherosclerotic heart disease of native coronary artery without angina pectoris: Secondary | ICD-10-CM | POA: Diagnosis not present

## 2023-09-01 DIAGNOSIS — I2510 Atherosclerotic heart disease of native coronary artery without angina pectoris: Secondary | ICD-10-CM | POA: Diagnosis not present

## 2023-09-01 DIAGNOSIS — I5023 Acute on chronic systolic (congestive) heart failure: Secondary | ICD-10-CM | POA: Diagnosis not present

## 2023-09-01 DIAGNOSIS — I252 Old myocardial infarction: Secondary | ICD-10-CM | POA: Diagnosis not present

## 2023-09-01 DIAGNOSIS — N1832 Chronic kidney disease, stage 3b: Secondary | ICD-10-CM | POA: Diagnosis not present

## 2023-09-01 DIAGNOSIS — D631 Anemia in chronic kidney disease: Secondary | ICD-10-CM | POA: Diagnosis not present

## 2023-09-01 DIAGNOSIS — M1712 Unilateral primary osteoarthritis, left knee: Secondary | ICD-10-CM | POA: Diagnosis not present

## 2023-09-03 DIAGNOSIS — I4901 Ventricular fibrillation: Secondary | ICD-10-CM | POA: Diagnosis not present

## 2023-09-03 DIAGNOSIS — D631 Anemia in chronic kidney disease: Secondary | ICD-10-CM | POA: Diagnosis not present

## 2023-09-03 DIAGNOSIS — E559 Vitamin D deficiency, unspecified: Secondary | ICD-10-CM | POA: Diagnosis not present

## 2023-09-03 DIAGNOSIS — Z953 Presence of xenogenic heart valve: Secondary | ICD-10-CM | POA: Diagnosis not present

## 2023-09-03 DIAGNOSIS — K648 Other hemorrhoids: Secondary | ICD-10-CM | POA: Diagnosis not present

## 2023-09-03 DIAGNOSIS — I252 Old myocardial infarction: Secondary | ICD-10-CM | POA: Diagnosis not present

## 2023-09-03 DIAGNOSIS — Z96643 Presence of artificial hip joint, bilateral: Secondary | ICD-10-CM | POA: Diagnosis not present

## 2023-09-03 DIAGNOSIS — I480 Paroxysmal atrial fibrillation: Secondary | ICD-10-CM | POA: Diagnosis not present

## 2023-09-03 DIAGNOSIS — E890 Postprocedural hypothyroidism: Secondary | ICD-10-CM | POA: Diagnosis not present

## 2023-09-03 DIAGNOSIS — E663 Overweight: Secondary | ICD-10-CM | POA: Diagnosis not present

## 2023-09-03 DIAGNOSIS — M1712 Unilateral primary osteoarthritis, left knee: Secondary | ICD-10-CM | POA: Diagnosis not present

## 2023-09-03 DIAGNOSIS — Z96651 Presence of right artificial knee joint: Secondary | ICD-10-CM | POA: Diagnosis not present

## 2023-09-03 DIAGNOSIS — I2510 Atherosclerotic heart disease of native coronary artery without angina pectoris: Secondary | ICD-10-CM | POA: Diagnosis not present

## 2023-09-03 DIAGNOSIS — Z9011 Acquired absence of right breast and nipple: Secondary | ICD-10-CM | POA: Diagnosis not present

## 2023-09-03 DIAGNOSIS — G720 Drug-induced myopathy: Secondary | ICD-10-CM | POA: Diagnosis not present

## 2023-09-03 DIAGNOSIS — Z7902 Long term (current) use of antithrombotics/antiplatelets: Secondary | ICD-10-CM | POA: Diagnosis not present

## 2023-09-03 DIAGNOSIS — Z955 Presence of coronary angioplasty implant and graft: Secondary | ICD-10-CM | POA: Diagnosis not present

## 2023-09-03 DIAGNOSIS — E785 Hyperlipidemia, unspecified: Secondary | ICD-10-CM | POA: Diagnosis not present

## 2023-09-03 DIAGNOSIS — I272 Pulmonary hypertension, unspecified: Secondary | ICD-10-CM | POA: Diagnosis not present

## 2023-09-03 DIAGNOSIS — I088 Other rheumatic multiple valve diseases: Secondary | ICD-10-CM | POA: Diagnosis not present

## 2023-09-03 DIAGNOSIS — Z6827 Body mass index (BMI) 27.0-27.9, adult: Secondary | ICD-10-CM | POA: Diagnosis not present

## 2023-09-03 DIAGNOSIS — I472 Ventricular tachycardia, unspecified: Secondary | ICD-10-CM | POA: Diagnosis not present

## 2023-09-03 DIAGNOSIS — N1832 Chronic kidney disease, stage 3b: Secondary | ICD-10-CM | POA: Diagnosis not present

## 2023-09-03 DIAGNOSIS — Z7984 Long term (current) use of oral hypoglycemic drugs: Secondary | ICD-10-CM | POA: Diagnosis not present

## 2023-09-03 DIAGNOSIS — I5023 Acute on chronic systolic (congestive) heart failure: Secondary | ICD-10-CM | POA: Diagnosis not present

## 2023-09-04 DIAGNOSIS — E039 Hypothyroidism, unspecified: Secondary | ICD-10-CM | POA: Diagnosis not present

## 2023-09-04 DIAGNOSIS — I480 Paroxysmal atrial fibrillation: Secondary | ICD-10-CM | POA: Diagnosis not present

## 2023-09-04 DIAGNOSIS — I4821 Permanent atrial fibrillation: Secondary | ICD-10-CM | POA: Diagnosis not present

## 2023-09-04 DIAGNOSIS — I10 Essential (primary) hypertension: Secondary | ICD-10-CM | POA: Diagnosis not present

## 2023-09-04 DIAGNOSIS — N183 Chronic kidney disease, stage 3 unspecified: Secondary | ICD-10-CM | POA: Diagnosis not present

## 2023-09-06 ENCOUNTER — Telehealth: Payer: Self-pay | Admitting: Cardiovascular Disease

## 2023-09-06 DIAGNOSIS — I5023 Acute on chronic systolic (congestive) heart failure: Secondary | ICD-10-CM | POA: Diagnosis not present

## 2023-09-06 DIAGNOSIS — I2510 Atherosclerotic heart disease of native coronary artery without angina pectoris: Secondary | ICD-10-CM | POA: Diagnosis not present

## 2023-09-06 DIAGNOSIS — M1712 Unilateral primary osteoarthritis, left knee: Secondary | ICD-10-CM | POA: Diagnosis not present

## 2023-09-06 DIAGNOSIS — D631 Anemia in chronic kidney disease: Secondary | ICD-10-CM | POA: Diagnosis not present

## 2023-09-06 DIAGNOSIS — I252 Old myocardial infarction: Secondary | ICD-10-CM | POA: Diagnosis not present

## 2023-09-06 DIAGNOSIS — N1832 Chronic kidney disease, stage 3b: Secondary | ICD-10-CM | POA: Diagnosis not present

## 2023-09-06 NOTE — Telephone Encounter (Signed)
 Calling to say that her right leg is shaking and she doesn't notice other people do. States that it normally happens just after walking. She does have a little swelling going on. Please advise

## 2023-09-06 NOTE — Telephone Encounter (Signed)
 Spoke with pt who reports some mild shaking in her right leg after being walked by PT. Shaking resolved aftere rest.   Also reports some mild swelling of lower extremities. Pt reports this is not new and her weight is stable.  She is taking Furosemide  and Spironolactone  as prescribed. Denies current CP, SOB or dizziness.  Pt advised to continue to monitor trembling in right leg as this could be the result of muscle weakness due to her inactivity.  Continue to monitor weight and take medications as prescribed.

## 2023-09-09 ENCOUNTER — Other Ambulatory Visit (HOSPITAL_COMMUNITY): Payer: Self-pay

## 2023-09-09 ENCOUNTER — Other Ambulatory Visit: Payer: Self-pay | Admitting: Cardiovascular Disease

## 2023-09-09 ENCOUNTER — Telehealth: Payer: Self-pay | Admitting: Cardiovascular Disease

## 2023-09-09 MED ORDER — CLOPIDOGREL BISULFATE 75 MG PO TABS
75.0000 mg | ORAL_TABLET | Freq: Every day | ORAL | 3 refills | Status: AC
  Filled 2023-09-09 (×3): qty 90, 90d supply, fill #0
  Filled 2023-11-13: qty 90, 90d supply, fill #1
  Filled 2023-11-15 – 2023-11-16 (×3): qty 90, 90d supply, fill #0
  Filled 2024-01-20 – 2024-01-23 (×2): qty 90, 90d supply, fill #1
  Filled ????-??-??: fill #1

## 2023-09-09 NOTE — Telephone Encounter (Signed)
 Pt's medication was sent to pt's pharmacy as requested. Confirmation received.

## 2023-09-09 NOTE — Telephone Encounter (Signed)
*  STAT* If patient is at the pharmacy, call can be transferred to refill team.   1. Which medications need to be refilled? (please list name of each medication and dose if known)   clopidogrel  (PLAVIX ) 75 MG tablet   2. Would you like to learn more about the convenience, safety, & potential cost savings by using the [facility]?   3. Are you open to using the [facility] (Type [facility]. ).  4. Which pharmacy/location (including street and city if local pharmacy) is medication to be sent to?  [facility]    5. Do they need a 30 day or 90 day supply?   90 day  Caller Calleen) noted patient only has 1 tablet left

## 2023-09-11 ENCOUNTER — Other Ambulatory Visit (HOSPITAL_COMMUNITY): Payer: Self-pay

## 2023-09-11 MED ORDER — AMIODARONE HCL 200 MG PO TABS
200.0000 mg | ORAL_TABLET | Freq: Every day | ORAL | 0 refills | Status: DC
  Filled 2023-09-19: qty 30, 30d supply, fill #0

## 2023-09-11 MED ORDER — EMPAGLIFLOZIN 10 MG PO TABS
10.0000 mg | ORAL_TABLET | Freq: Every day | ORAL | 3 refills | Status: AC
  Filled 2023-09-28: qty 30, 30d supply, fill #0
  Filled 2023-10-23: qty 30, 30d supply, fill #1
  Filled 2023-11-13 – 2023-11-15 (×2): qty 30, 30d supply, fill #2
  Filled 2023-12-26: qty 30, 30d supply, fill #3
  Filled 2024-01-23: qty 30, 30d supply, fill #4
  Filled 2024-02-08: qty 30, 30d supply, fill #5
  Filled ????-??-??: fill #2

## 2023-09-13 DIAGNOSIS — M1712 Unilateral primary osteoarthritis, left knee: Secondary | ICD-10-CM | POA: Diagnosis not present

## 2023-09-13 DIAGNOSIS — I5023 Acute on chronic systolic (congestive) heart failure: Secondary | ICD-10-CM | POA: Diagnosis not present

## 2023-09-13 DIAGNOSIS — D631 Anemia in chronic kidney disease: Secondary | ICD-10-CM | POA: Diagnosis not present

## 2023-09-13 DIAGNOSIS — I2510 Atherosclerotic heart disease of native coronary artery without angina pectoris: Secondary | ICD-10-CM | POA: Diagnosis not present

## 2023-09-13 DIAGNOSIS — N1832 Chronic kidney disease, stage 3b: Secondary | ICD-10-CM | POA: Diagnosis not present

## 2023-09-13 DIAGNOSIS — I252 Old myocardial infarction: Secondary | ICD-10-CM | POA: Diagnosis not present

## 2023-09-19 ENCOUNTER — Other Ambulatory Visit (HOSPITAL_COMMUNITY): Payer: Self-pay

## 2023-09-20 ENCOUNTER — Telehealth: Payer: Self-pay | Admitting: Cardiovascular Disease

## 2023-09-20 ENCOUNTER — Other Ambulatory Visit (HOSPITAL_COMMUNITY): Payer: Self-pay

## 2023-09-20 MED ORDER — AMIODARONE HCL 200 MG PO TABS
200.0000 mg | ORAL_TABLET | Freq: Every day | ORAL | 3 refills | Status: AC
  Filled 2023-09-20: qty 90, 90d supply, fill #0
  Filled 2023-09-28: qty 30, 30d supply, fill #0
  Filled 2023-10-23: qty 30, 30d supply, fill #1
  Filled 2023-11-13 – 2023-11-15 (×2): qty 30, 30d supply, fill #2
  Filled 2024-01-20: qty 30, 30d supply, fill #3
  Filled 2024-01-23: qty 90, 90d supply, fill #3
  Filled 2024-02-08: qty 60, 60d supply, fill #4
  Filled ????-??-??: fill #2

## 2023-09-20 NOTE — Telephone Encounter (Signed)
*  STAT* If patient is at the pharmacy, call can be transferred to refill team.   1. Which medications need to be refilled? (please list name of each medication and dose if known) amiodarone  (PACERONE ) 200 MG tablet    2. Would you like to learn more about the convenience, safety, & potential cost savings by using the [facility]?    3. Are you open to using the [facility] ([facility].  ).   4. Which pharmacy/location (including street and city if local pharmacy) is medication to be sent to? [facility]    5. Do they need a 30 day or 90 day supply? 90 day

## 2023-09-20 NOTE — Telephone Encounter (Signed)
 Pt's medication was sent to pt's pharmacy as requested. Confirmation received.

## 2023-09-22 DIAGNOSIS — I10 Essential (primary) hypertension: Secondary | ICD-10-CM | POA: Diagnosis not present

## 2023-09-22 DIAGNOSIS — I4821 Permanent atrial fibrillation: Secondary | ICD-10-CM | POA: Diagnosis not present

## 2023-09-22 DIAGNOSIS — N183 Chronic kidney disease, stage 3 unspecified: Secondary | ICD-10-CM | POA: Diagnosis not present

## 2023-09-22 DIAGNOSIS — I480 Paroxysmal atrial fibrillation: Secondary | ICD-10-CM | POA: Diagnosis not present

## 2023-09-27 ENCOUNTER — Other Ambulatory Visit (HOSPITAL_BASED_OUTPATIENT_CLINIC_OR_DEPARTMENT_OTHER): Payer: Self-pay

## 2023-09-28 ENCOUNTER — Other Ambulatory Visit (HOSPITAL_BASED_OUTPATIENT_CLINIC_OR_DEPARTMENT_OTHER): Payer: Self-pay

## 2023-10-04 DIAGNOSIS — I480 Paroxysmal atrial fibrillation: Secondary | ICD-10-CM | POA: Diagnosis not present

## 2023-10-04 DIAGNOSIS — I10 Essential (primary) hypertension: Secondary | ICD-10-CM | POA: Diagnosis not present

## 2023-10-04 DIAGNOSIS — E039 Hypothyroidism, unspecified: Secondary | ICD-10-CM | POA: Diagnosis not present

## 2023-10-04 DIAGNOSIS — N183 Chronic kidney disease, stage 3 unspecified: Secondary | ICD-10-CM | POA: Diagnosis not present

## 2023-10-04 DIAGNOSIS — I4821 Permanent atrial fibrillation: Secondary | ICD-10-CM | POA: Diagnosis not present

## 2023-10-17 ENCOUNTER — Telehealth (HOSPITAL_COMMUNITY): Payer: Self-pay

## 2023-10-17 NOTE — Telephone Encounter (Signed)
 Advanced Heart Failure Triage Encounter  Patient Name: [name]  Date of Call: 10/17/23  Problem:  Patient and patients daughter states there is no pont in the having the Echo. Patients daughter states her mother is  89 years old if the do fine something what diffacere would it make? Daughter states they will go see her general cardiologist in November. She doesn't see why her mother needs to see another cardiologist.     Plan:  Sending to provider for further review   Rolin LOISE Height, CMA

## 2023-10-20 ENCOUNTER — Ambulatory Visit (HOSPITAL_COMMUNITY)

## 2023-10-20 ENCOUNTER — Encounter (HOSPITAL_COMMUNITY): Admitting: Internal Medicine

## 2023-10-22 DIAGNOSIS — I4821 Permanent atrial fibrillation: Secondary | ICD-10-CM | POA: Diagnosis not present

## 2023-10-22 DIAGNOSIS — N183 Chronic kidney disease, stage 3 unspecified: Secondary | ICD-10-CM | POA: Diagnosis not present

## 2023-10-22 DIAGNOSIS — I480 Paroxysmal atrial fibrillation: Secondary | ICD-10-CM | POA: Diagnosis not present

## 2023-10-22 DIAGNOSIS — I10 Essential (primary) hypertension: Secondary | ICD-10-CM | POA: Diagnosis not present

## 2023-11-04 DIAGNOSIS — I4821 Permanent atrial fibrillation: Secondary | ICD-10-CM | POA: Diagnosis not present

## 2023-11-04 DIAGNOSIS — I10 Essential (primary) hypertension: Secondary | ICD-10-CM | POA: Diagnosis not present

## 2023-11-04 DIAGNOSIS — E039 Hypothyroidism, unspecified: Secondary | ICD-10-CM | POA: Diagnosis not present

## 2023-11-04 DIAGNOSIS — N183 Chronic kidney disease, stage 3 unspecified: Secondary | ICD-10-CM | POA: Diagnosis not present

## 2023-11-04 DIAGNOSIS — I480 Paroxysmal atrial fibrillation: Secondary | ICD-10-CM | POA: Diagnosis not present

## 2023-11-09 ENCOUNTER — Other Ambulatory Visit (HOSPITAL_BASED_OUTPATIENT_CLINIC_OR_DEPARTMENT_OTHER): Payer: Self-pay

## 2023-11-14 ENCOUNTER — Other Ambulatory Visit (HOSPITAL_COMMUNITY): Payer: Self-pay

## 2023-11-15 ENCOUNTER — Other Ambulatory Visit: Payer: Self-pay

## 2023-11-15 ENCOUNTER — Other Ambulatory Visit (HOSPITAL_COMMUNITY): Payer: Self-pay

## 2023-11-15 ENCOUNTER — Other Ambulatory Visit (HOSPITAL_BASED_OUTPATIENT_CLINIC_OR_DEPARTMENT_OTHER): Payer: Self-pay

## 2023-11-15 DIAGNOSIS — Z23 Encounter for immunization: Secondary | ICD-10-CM | POA: Diagnosis not present

## 2023-11-17 ENCOUNTER — Other Ambulatory Visit (HOSPITAL_BASED_OUTPATIENT_CLINIC_OR_DEPARTMENT_OTHER): Payer: Self-pay

## 2023-11-18 ENCOUNTER — Other Ambulatory Visit: Payer: Self-pay

## 2023-11-18 ENCOUNTER — Other Ambulatory Visit (HOSPITAL_BASED_OUTPATIENT_CLINIC_OR_DEPARTMENT_OTHER): Payer: Self-pay

## 2023-11-21 DIAGNOSIS — I10 Essential (primary) hypertension: Secondary | ICD-10-CM | POA: Diagnosis not present

## 2023-11-21 DIAGNOSIS — I480 Paroxysmal atrial fibrillation: Secondary | ICD-10-CM | POA: Diagnosis not present

## 2023-11-21 DIAGNOSIS — N183 Chronic kidney disease, stage 3 unspecified: Secondary | ICD-10-CM | POA: Diagnosis not present

## 2023-11-21 DIAGNOSIS — I4821 Permanent atrial fibrillation: Secondary | ICD-10-CM | POA: Diagnosis not present

## 2023-11-24 ENCOUNTER — Ambulatory Visit: Attending: Cardiovascular Disease | Admitting: Cardiovascular Disease

## 2023-11-24 ENCOUNTER — Other Ambulatory Visit (HOSPITAL_BASED_OUTPATIENT_CLINIC_OR_DEPARTMENT_OTHER): Payer: Self-pay

## 2023-11-24 ENCOUNTER — Encounter: Payer: Self-pay | Admitting: Cardiovascular Disease

## 2023-11-24 VITALS — BP 123/68 | HR 55 | Ht 60.0 in | Wt 144.2 lb

## 2023-11-24 DIAGNOSIS — I2510 Atherosclerotic heart disease of native coronary artery without angina pectoris: Secondary | ICD-10-CM | POA: Insufficient documentation

## 2023-11-24 DIAGNOSIS — I359 Nonrheumatic aortic valve disorder, unspecified: Secondary | ICD-10-CM | POA: Diagnosis not present

## 2023-11-24 DIAGNOSIS — I480 Paroxysmal atrial fibrillation: Secondary | ICD-10-CM | POA: Diagnosis not present

## 2023-11-24 DIAGNOSIS — I779 Disorder of arteries and arterioles, unspecified: Secondary | ICD-10-CM | POA: Diagnosis not present

## 2023-11-24 DIAGNOSIS — E039 Hypothyroidism, unspecified: Secondary | ICD-10-CM | POA: Diagnosis not present

## 2023-11-24 DIAGNOSIS — Z952 Presence of prosthetic heart valve: Secondary | ICD-10-CM | POA: Diagnosis not present

## 2023-11-24 DIAGNOSIS — I5022 Chronic systolic (congestive) heart failure: Secondary | ICD-10-CM | POA: Diagnosis not present

## 2023-11-24 MED ORDER — LEVOTHYROXINE SODIUM 125 MCG PO TABS
125.0000 ug | ORAL_TABLET | Freq: Every day | ORAL | 0 refills | Status: AC
  Filled 2023-11-24: qty 90, 90d supply, fill #0

## 2023-11-24 MED ORDER — LEVOTHYROXINE SODIUM 125 MCG PO TABS
125.0000 ug | ORAL_TABLET | Freq: Every morning | ORAL | 0 refills | Status: AC
  Filled 2023-11-24 – 2024-02-08 (×4): qty 90, 90d supply, fill #0

## 2023-11-24 NOTE — Patient Instructions (Signed)
 Medication Instructions:  Your physician recommends that you continue on your current medications as directed. Please refer to the Current Medication list given to you today.  *If you need a refill on your cardiac medications before your next appointment, please call your pharmacy*  Lab Work: none If you have labs (blood work) drawn today and your tests are completely normal, you will receive your results only by: MyChart Message (if you have MyChart) OR A paper copy in the mail If you have any lab test that is abnormal or we need to change your treatment, we will call you to review the results.  Testing/Procedures: none  Follow-Up: At [facility], you and your health needs are our priority.  As part of our continuing mission to provide you with exceptional heart care, our providers are all part of one team.  This team includes your primary Cardiologist (physician) and Advanced Practice Providers or APPs (Physician Assistants and Nurse Practitioners) who all work together to provide you with the care you need, when you need it.  Your next appointment:   6 month(s)  Provider:   Lonni Cash, MD    We recommend signing up for the patient portal called MyChart.  Sign up information is provided on this After Visit Summary.  MyChart is used to connect with patients for Virtual Visits (Telemedicine).  Patients are able to view lab/test results, encounter notes, upcoming appointments, etc.  Non-urgent messages can be sent to your provider as well.   To learn more about what you can do with MyChart, go to [email or web address].   Other Instructions

## 2023-11-24 NOTE — Progress Notes (Signed)
 Chief Complaint  Patient presents with   Follow-up    CAD, AV disease   History of Present Illness: 89 yo female with history of CAD, HLD, SVT, paroxysmal atrial fibrillation, breast cancer, chronic diastolic CHF, bilateral carotid artery disease and severe aortic valve stenosis s/p TAVR who is here today for cardiac follow up.  Cardiac cath in July 2017 with mild plaque in the LAD. She underwent TAVR on 07/27/16 with placement of a 26 mm Edwards Sapien 3 bioprosthetic AVR from the right femoral artery. Carotid artery dopplers November 2022 with mild to moderate bilateral carotid artery disease. I saw her in October 2023 and she c/o palpitations and weight gain. Echo 11/13/21 with LVEF=55-60%. AVR working well.  Cardiac monitor November 2023 with runs of SVT. She was admitted to [facility] December  2023 with atrial fib with RVR and underwent TEE guided cardioversion and was discharged on Cardizem  and Eliquis  but had an allergic reaction and was changed to atenolol  and Xarelto . She was seen in our office 03/16/23 by Dr. Wendel for dizziness and report of heart rate in the 40s on her home smart watch. Heart rate 43 bpm in our office. Atenolol  was stopped. Cardiac monitor March 2025 with with rare premature beats (PACs/PVCs). No heart block noted. I saw her in the office 03/25/23 and she was feeling better but noted LE edema since cutting back on her Lasix . Her Lasix  was increased to 40 mg BID. She was admitted with anterolateral STEMI in June 2025 and found to have 99% proximal LAD stenosis treated with a drug eluting stent. She developed cardiogenic shock post MI and was managed with norepinephrine  and milrinone . Echo with LVEF=40-45%. She was readmitted 07/01/23 with upper GI bleeding. No recurrent episodes while admitted. ASA and Xarelto  were stopped She was discharged on Plavix .   She is here today for follow up. The patient denies any chest pain, dyspnea, palpitations, lower extremity edema, orthopnea, PND,  dizziness, near syncope or syncope.     Primary Care Physician: Auston Opal, DO  Past Medical History:  Diagnosis Date   Arthritis    hands, knees   CAD (coronary artery disease), native coronary artery cardiologist--- dr verlin   a. mild non obst CAD per cath 07-18-2015   Carotid artery disease    Carotid US  09/2018: Bilateral ICA 40-59 // Carotid US  11/21: Bilateral ICA 40-59; repeat 1 year    Chronic diastolic CHF (congestive heart failure) (HCC)    followed by cardiology   CKD (chronic kidney disease), stage III (HCC)    followed by pcp   Dyspnea    per pt when she walks which is usual for her   History of gastric ulcer    remote yrs ago   History of rheumatic fever as a child    History of right breast cancer 2006   s/p right partial mastectomy w/ node dissection's,  completed chemo/ radiation same year  (05-21-2020 pt stated no recurrence)   Hyperlipidemia    Hypothyroidism    followed by pcp   Osteoporosis    PMB (postmenopausal bleeding)    PSVT (paroxysmal supraventricular tachycardia) 10/2012   in ED resolved w/ vagal maneuver    S/P aortic valve replacement 07/27/2016   for severe stenosis;  last echo in epic 09-22-2018 ef 55-60%, mild TR, aortic valve area 1.4cm^2 and mean gradiant 17 mmHg   Seasonal allergies    Vitamin D  deficiency     Past Surgical History:  Procedure Laterality Date  CARDIAC CATHETERIZATION N/A 07/18/2015   Procedure: Right/Left Heart Cath and Coronary Angiography;  Surgeon: Lonni JONETTA Cash, MD;  Location: [facility] INVASIVE CV LAB;  Service: Cardiovascular;  Laterality: N/A;   CARDIOVERSION N/A 01/01/2022   Procedure: CARDIOVERSION;  Surgeon: Hobart Powell BRAVO, MD;  Location: [facility] ENDOSCOPY;  Service: Cardiovascular;  Laterality: N/A;   CARDIOVERSION N/A 09/10/2022   Procedure: CARDIOVERSION;  Surgeon: Delford Maude BROCKS, MD;  Location: [facility] INVASIVE CV LAB;  Service: Cardiovascular;  Laterality: N/A;   CATARACT EXTRACTION W/ INTRAOCULAR LENS   IMPLANT, BILATERAL     2017   CHOLECYSTECTOMY N/A 08/06/2014   Procedure: LAPAROSCOPIC CHOLECYSTECTOMY ;  Surgeon: Donnice Bury, MD;  Location: [facility] OR;  Service: General;  Laterality: N/A;   CORONARY/GRAFT ACUTE MI REVASCULARIZATION N/A 06/19/2023   Procedure: Coronary/Graft Acute MI Revascularization;  Surgeon: Elmira Newman PARAS, MD;  Location: [facility] INVASIVE CV LAB;  Service: Cardiovascular;  Laterality: N/A;   HYSTEROSCOPY WITH D & C N/A 05/26/2020   Procedure: DILATATION AND CURETTAGE /HYSTEROSCOPY;  Surgeon: Jannis Kate Norris, MD;  Location: [facility] ;  Service: Gynecology;  Laterality: N/A;  Request case for [facility]   LEFT HEART CATH AND CORONARY ANGIOGRAPHY N/A 06/19/2023   Procedure: LEFT HEART CATH AND CORONARY ANGIOGRAPHY;  Surgeon: Elmira Newman PARAS, MD;  Location: [facility] INVASIVE CV LAB;  Service: Cardiovascular;  Laterality: N/A;   PARTIAL MASTECTOMY WITH AXILLARY SENTINEL LYMPH NODE BIOPSY Right 2006   RIGHT HEART CATH N/A 06/19/2023   Procedure: RIGHT HEART CATH;  Surgeon: Elmira Newman PARAS, MD;  Location: [facility] INVASIVE CV LAB;  Service: Cardiovascular;  Laterality: N/A;   TEE WITHOUT CARDIOVERSION N/A 07/27/2016   Procedure: TRANSESOPHAGEAL ECHOCARDIOGRAM (TEE);  Surgeon: Cash Lonni JONETTA, MD;  Location: [facility] OR;  Service: Open Heart Surgery;  Laterality: N/A;   TEE WITHOUT CARDIOVERSION N/A 01/01/2022   Procedure: TRANSESOPHAGEAL ECHOCARDIOGRAM (TEE);  Surgeon: Hobart Powell BRAVO, MD;  Location: [facility] ENDOSCOPY;  Service: Cardiovascular;  Laterality: N/A;   THYROID  SURGERY  1969   NODULE REMOVED, benign per pt   TONSILLECTOMY  age 85   TOTAL HIP ARTHROPLASTY Left 2003   TOTAL HIP ARTHROPLASTY Right 08/28/2013   Procedure: RIGHT TOTAL HIP ARTHROPLASTY ANTERIOR APPROACH;  Surgeon: Donnice JONETTA Car, MD;  Location: [facility] ORS;  Service: Orthopedics;  Laterality: Right;   TOTAL KNEE ARTHROPLASTY Right 01-16-2007  @[facility]    TRANSCATHETER AORTIC VALVE REPLACEMENT,  TRANSFEMORAL N/A 07/27/2016   Procedure: TRANSCATHETER AORTIC VALVE REPLACEMENT, TRANSFEMORAL;  Surgeon: Cash Lonni JONETTA, MD;  Location: [facility] OR;  Service: Open Heart Surgery;  Laterality: N/A;    Current Outpatient Medications  Medication Sig Dispense Refill   acetaminophen  (TYLENOL ) 500 MG tablet Take 1 tablet (500 mg total) by mouth every 8 (eight) hours as needed for mild pain (pain score 1-3) or moderate pain (pain score 4-6). (Patient taking differently: Take 500 mg by mouth 3 (three) times daily.)     ALPRAZolam (NIRAVAM) 0.25 MG dissolvable tablet Take 0.25 mg by mouth at bedtime as needed for anxiety.     amiodarone  (PACERONE ) 200 MG tablet Take 1 tablet (200 mg total) by mouth daily. 90 tablet 3   clopidogrel  (PLAVIX ) 75 MG tablet Take 1 tablet (75 mg total) by mouth daily. 90 tablet 3   empagliflozin  (JARDIANCE ) 10 MG TABS tablet Take 1 tablet (10 mg total) by mouth daily. 90 tablet 3   furosemide  (LASIX ) 40 MG tablet Take 1 tablet (40 mg total) by mouth daily. 90 tablet 3   Magnesium  400  MG CAPS Take 400 mg by mouth in the morning and at bedtime.     mexiletine (MEXITIL ) 150 MG capsule Take 1 capsule (150 mg total) by mouth every 8 (eight) hours. 270 capsule 3   polyethylene glycol (MIRALAX  / GLYCOLAX ) 17 g packet Take 17 g by mouth daily. (Patient taking differently: Take 17 g by mouth as needed.)     spironolactone  (ALDACTONE ) 25 MG tablet Take 1 tablet (25 mg total) by mouth daily. 90 tablet 3   levothyroxine  (SYNTHROID ) 125 MCG tablet Take 1 tablet (125 mcg total) by mouth daily. 90 tablet 0   No current facility-administered medications for this visit.    Allergies  Allergen Reactions   Compazine [Prochlorperazine] Other (See Comments)    Unknown, pt does not remember   Fenofibrate Rash   Floxin [Ofloxacin] Other (See Comments)    Unknown, pt does not remember reaction but was allergic reaction   Statins Other (See Comments)    Myalgia - Atorvastatin, simvastatin,  pitavastatin    Social History   Socioeconomic History   Marital status: Widowed    Spouse name: Not on file   Number of children: Not on file   Years of education: Not on file   Highest education level: Not on file  Occupational History   Not on file  Tobacco Use   Smoking status: Never   Smokeless tobacco: Never   Tobacco comments:    Never smoked 08/24/22  Vaping Use   Vaping status: Never Used  Substance and Sexual Activity   Alcohol use: No   Drug use: Never   Sexual activity: Not Currently    Birth control/protection: Post-menopausal  Other Topics Concern   Not on file  Social History Narrative   Not on file   Social Drivers of Health   Financial Resource Strain: Not on file  Food Insecurity: No Food Insecurity (07/02/2023)   Hunger Vital Sign    Worried About Running Out of Food in the Last Year: Never true    Ran Out of Food in the Last Year: Never true  Transportation Needs: No Transportation Needs (07/02/2023)   PRAPARE - Administrator, Civil Service (Medical): No    Lack of Transportation (Non-Medical): No  Physical Activity: Not on file  Stress: Not on file  Social Connections: Moderately Isolated (07/02/2023)   Social Connection and Isolation Panel    Frequency of Communication with Friends and Family: Three times a week    Frequency of Social Gatherings with Friends and Family: Three times a week    Attends Religious Services: More than 4 times per year    Active Member of Clubs or Organizations: No    Attends Banker Meetings: Never    Marital Status: Widowed  Intimate Partner Violence: Not At Risk (07/02/2023)   Humiliation, Afraid, Rape, and Kick questionnaire    Fear of Current or Ex-Partner: No    Emotionally Abused: No    Physically Abused: No    Sexually Abused: No    Family History  Problem Relation Age of Onset   Heart disease Mother    Heart disease Father     Review of Systems:  As stated in the HPI and  otherwise negative.   BP 123/68   Pulse (!) 55   Ht 5' (1.524 m)   Wt 144 lb 3.2 oz (65.4 kg)   LMP  (LMP Unknown)   SpO2 95%   BMI 28.16 kg/m   Physical  Examination: General: Well developed, well nourished, NAD  HEENT: OP clear, mucus membranes moist  SKIN: warm, dry. No rashes. Neuro: No focal deficits  Musculoskeletal: Muscle strength 5/5 all ext  Psychiatric: Mood and affect normal  Neck: No JVD, no carotid bruits, no thyromegaly, no lymphadenopathy.  Lungs:Clear bilaterally, no wheezes, rhonci, crackles Cardiovascular: Regular rate and rhythm. No murmurs, gallops or rubs. Abdomen:Soft. Bowel sounds present. Non-tender.  Extremities: No lower extremity edema. Pulses are 2 + in the bilateral DP/PT.  EKG:  EKG is not ordered today. The ekg ordered today demonstrates   Recent Labs: 07/01/2023: B Natriuretic Peptide 4,445.8 07/02/2023: ALT 34 07/04/2023: Magnesium  2.0; TSH 1.850 07/18/2023: BUN 14; Creatinine, Ser 1.46; Hemoglobin 12.4; Platelets 325; Potassium 3.5; Sodium 137   Lipid Panel    Component Value Date/Time   CHOL 171 06/20/2023 0139   TRIG 54 06/20/2023 0139   HDL 52 06/20/2023 0139   CHOLHDL 3.3 06/20/2023 0139   VLDL 11 06/20/2023 0139   LDLCALC 108 (H) 06/20/2023 0139    Wt Readings from Last 3 Encounters:  11/24/23 144 lb 3.2 oz (65.4 kg)  08/29/23 149 lb (67.6 kg)  08/12/23 142 lb (64.4 kg)    Assessment and Plan:   1. Severe aortic valve stenosis: She underwent TAVR with placement of a 26 mm Sapien 3 valve from the right transfemoral approach in July 2018. Her valve was working well by echo in June 2025.  Continue Plavix . Continue SBE prophylaxis when indicated.   2. CAD without angina: Anterior STEMI June 2025 secondary to severe proximal LAD stenosis. One DES placed in the proximal LAD. She has no chest pain. Will continue Plavix .  She is statin intolerant. We discussed Repatha today but given her age, she does not wish to start Repatha.   3.  Atrial fibrillation, paroxysmal: Sinus today. She is no longer on Xarelto  due to GI bleeding. She does not wish to restart Xarelto .  Continue amiodarone .   TSH and liver tests normal in June 2025. She gets yearly eye exams.   4. Ischemic cardiomyopathy/Chronic systolic and diastolic CHF: LVEF around 45% in June 2025. Wt is stable. No LE edema. Continue Lasix  and aldactone .    5. Carotid artery disease: Mild to moderate bilateral carotid artery disease by dopplers November 2022. Given advanced age, will not repeat dopplers  6. Hypothyroidism: TSH 1.85 in June 2025. Will refill Synthroid  125 mcg daily.   Labs/ tests ordered today include:  No orders of the defined types were placed in this encounter.  Disposition:   F/U with me in 6 months  Signed, Lonni Cash, MD 11/24/2023 12:13 PM    [facility] [address] [place], [place], [place]  [address] Phone: [phone]; Fax: [phone]

## 2023-11-26 ENCOUNTER — Other Ambulatory Visit (HOSPITAL_BASED_OUTPATIENT_CLINIC_OR_DEPARTMENT_OTHER): Payer: Self-pay

## 2023-12-04 DIAGNOSIS — E039 Hypothyroidism, unspecified: Secondary | ICD-10-CM | POA: Diagnosis not present

## 2023-12-04 DIAGNOSIS — I10 Essential (primary) hypertension: Secondary | ICD-10-CM | POA: Diagnosis not present

## 2023-12-04 DIAGNOSIS — I480 Paroxysmal atrial fibrillation: Secondary | ICD-10-CM | POA: Diagnosis not present

## 2023-12-04 DIAGNOSIS — I4821 Permanent atrial fibrillation: Secondary | ICD-10-CM | POA: Diagnosis not present

## 2023-12-04 DIAGNOSIS — N183 Chronic kidney disease, stage 3 unspecified: Secondary | ICD-10-CM | POA: Diagnosis not present

## 2023-12-07 ENCOUNTER — Telehealth: Payer: Self-pay | Admitting: Cardiovascular Disease

## 2023-12-07 NOTE — Telephone Encounter (Signed)
 Patient identification verified by 2 forms.   Called and spoke to patient's daughter. Patient states:  -Pt's legs hurt for an hour when she got in bed last night.  -Legs have been better today.

## 2023-12-07 NOTE — Telephone Encounter (Signed)
 Patient stating she had ankle pain last night and will like to know where it is coming from

## 2023-12-26 ENCOUNTER — Other Ambulatory Visit (HOSPITAL_BASED_OUTPATIENT_CLINIC_OR_DEPARTMENT_OTHER): Payer: Self-pay

## 2024-01-20 ENCOUNTER — Other Ambulatory Visit (HOSPITAL_BASED_OUTPATIENT_CLINIC_OR_DEPARTMENT_OTHER): Payer: Self-pay

## 2024-01-23 ENCOUNTER — Other Ambulatory Visit: Payer: Self-pay

## 2024-01-23 ENCOUNTER — Other Ambulatory Visit (HOSPITAL_BASED_OUTPATIENT_CLINIC_OR_DEPARTMENT_OTHER): Payer: Self-pay

## 2024-01-24 ENCOUNTER — Other Ambulatory Visit: Payer: Self-pay

## 2024-01-24 ENCOUNTER — Other Ambulatory Visit (HOSPITAL_BASED_OUTPATIENT_CLINIC_OR_DEPARTMENT_OTHER): Payer: Self-pay

## 2024-01-25 ENCOUNTER — Other Ambulatory Visit (HOSPITAL_BASED_OUTPATIENT_CLINIC_OR_DEPARTMENT_OTHER): Payer: Self-pay

## 2024-02-08 ENCOUNTER — Other Ambulatory Visit (HOSPITAL_BASED_OUTPATIENT_CLINIC_OR_DEPARTMENT_OTHER): Payer: Self-pay
